# Patient Record
Sex: Male | Born: 1950 | Race: White | Hispanic: No | Smoking: Former smoker
Health system: Southern US, Community
[De-identification: ages and names within clinical notes are randomized; demographics above are authoritative.]

---

## 1997-10-16 ENCOUNTER — Ambulatory Visit (HOSPITAL_BASED_OUTPATIENT_CLINIC_OR_DEPARTMENT_OTHER): Admission: RE | Admit: 1997-10-16 | Discharge: 1997-10-16 | Payer: Self-pay | Admitting: *Deleted

## 1999-03-26 ENCOUNTER — Ambulatory Visit (HOSPITAL_BASED_OUTPATIENT_CLINIC_OR_DEPARTMENT_OTHER): Admission: RE | Admit: 1999-03-26 | Discharge: 1999-03-26 | Payer: Self-pay | Admitting: Orthopedic Surgery

## 2005-07-19 ENCOUNTER — Emergency Department (HOSPITAL_COMMUNITY): Admission: EM | Admit: 2005-07-19 | Discharge: 2005-07-19 | Payer: Self-pay | Admitting: Family Medicine

## 2006-08-08 ENCOUNTER — Emergency Department (HOSPITAL_COMMUNITY): Admission: EM | Admit: 2006-08-08 | Discharge: 2006-08-08 | Payer: Self-pay | Admitting: Emergency Medicine

## 2006-08-10 ENCOUNTER — Ambulatory Visit (HOSPITAL_BASED_OUTPATIENT_CLINIC_OR_DEPARTMENT_OTHER): Admission: RE | Admit: 2006-08-10 | Discharge: 2006-08-10 | Payer: Self-pay | Admitting: Urology

## 2006-08-14 ENCOUNTER — Ambulatory Visit (HOSPITAL_COMMUNITY): Admission: RE | Admit: 2006-08-14 | Discharge: 2006-08-14 | Payer: Self-pay | Admitting: Urology

## 2006-11-03 ENCOUNTER — Ambulatory Visit (HOSPITAL_BASED_OUTPATIENT_CLINIC_OR_DEPARTMENT_OTHER): Admission: RE | Admit: 2006-11-03 | Discharge: 2006-11-03 | Payer: Self-pay | Admitting: Urology

## 2006-12-04 ENCOUNTER — Other Ambulatory Visit: Payer: Self-pay | Admitting: Orthopedic Surgery

## 2006-12-05 ENCOUNTER — Other Ambulatory Visit: Payer: Self-pay | Admitting: Orthopedic Surgery

## 2006-12-05 ENCOUNTER — Inpatient Hospital Stay (HOSPITAL_COMMUNITY): Admission: RE | Admit: 2006-12-05 | Discharge: 2006-12-11 | Payer: Self-pay | Admitting: Orthopedic Surgery

## 2006-12-15 ENCOUNTER — Emergency Department (HOSPITAL_COMMUNITY): Admission: EM | Admit: 2006-12-15 | Discharge: 2006-12-15 | Payer: Self-pay | Admitting: Emergency Medicine

## 2006-12-16 ENCOUNTER — Ambulatory Visit (HOSPITAL_COMMUNITY): Admission: RE | Admit: 2006-12-16 | Discharge: 2006-12-16 | Payer: Self-pay | Admitting: Emergency Medicine

## 2006-12-16 ENCOUNTER — Ambulatory Visit: Payer: Self-pay | Admitting: Vascular Surgery

## 2006-12-16 ENCOUNTER — Encounter (INDEPENDENT_AMBULATORY_CARE_PROVIDER_SITE_OTHER): Payer: Self-pay | Admitting: Emergency Medicine

## 2008-10-15 ENCOUNTER — Emergency Department (HOSPITAL_COMMUNITY): Admission: EM | Admit: 2008-10-15 | Discharge: 2008-10-15 | Payer: Self-pay | Admitting: Family Medicine

## 2008-10-31 ENCOUNTER — Ambulatory Visit (HOSPITAL_BASED_OUTPATIENT_CLINIC_OR_DEPARTMENT_OTHER): Admission: RE | Admit: 2008-10-31 | Discharge: 2008-10-31 | Payer: Self-pay | Admitting: Urology

## 2010-04-18 ENCOUNTER — Inpatient Hospital Stay (HOSPITAL_COMMUNITY): Admission: EM | Admit: 2010-04-18 | Discharge: 2010-04-21 | Payer: Self-pay

## 2010-09-15 LAB — CBC
HCT: 34.6 % — ABNORMAL LOW (ref 39.0–52.0)
HCT: 35.1 % — ABNORMAL LOW (ref 39.0–52.0)
Hemoglobin: 11 g/dL — ABNORMAL LOW (ref 13.0–17.0)
Hemoglobin: 11.5 g/dL — ABNORMAL LOW (ref 13.0–17.0)
Hemoglobin: 12 g/dL — ABNORMAL LOW (ref 13.0–17.0)
Hemoglobin: 13 g/dL (ref 13.0–17.0)
MCH: 29.6 pg (ref 26.0–34.0)
MCHC: 33.2 g/dL (ref 30.0–36.0)
MCHC: 33.5 g/dL (ref 30.0–36.0)
MCV: 88.9 fL (ref 78.0–100.0)
MCV: 90.3 fL (ref 78.0–100.0)
Platelets: 150 10*3/uL (ref 150–400)
RBC: 3.71 MIL/uL — ABNORMAL LOW (ref 4.22–5.81)
RDW: 13.4 % (ref 11.5–15.5)
WBC: 6.1 10*3/uL (ref 4.0–10.5)
WBC: 6.7 10*3/uL (ref 4.0–10.5)
WBC: 8.3 10*3/uL (ref 4.0–10.5)
WBC: 9.4 10*3/uL (ref 4.0–10.5)

## 2010-09-15 LAB — POCT I-STAT, CHEM 8
BUN: 8 mg/dL (ref 6–23)
Creatinine, Ser: 1.3 mg/dL (ref 0.4–1.5)
Potassium: 3.4 meq/L — ABNORMAL LOW (ref 3.5–5.1)
Sodium: 141 meq/L (ref 135–145)

## 2010-09-15 LAB — COMPREHENSIVE METABOLIC PANEL
ALT: 46 U/L (ref 0–53)
AST: 75 U/L — ABNORMAL HIGH (ref 0–37)
CO2: 27 meq/L (ref 19–32)
Calcium: 8.9 mg/dL (ref 8.4–10.5)
GFR calc Af Amer: >60 mL/min (ref >60)
GFR calc non Af Amer: >60 mL/min (ref >60)
Potassium: 3.4 meq/L — ABNORMAL LOW (ref 3.5–5.1)
Sodium: 139 meq/L (ref 135–145)
Total Protein: 6.3 g/dL (ref 6.0–8.3)

## 2010-09-15 LAB — URINALYSIS, ROUTINE W REFLEX MICROSCOPIC
Glucose, UA: NEGATIVE mg/dL
Specific Gravity, Urine: >1.046 — ABNORMAL HIGH (ref 1.005–1.030)

## 2010-09-15 LAB — BASIC METABOLIC PANEL
Calcium: 8.7 mg/dL (ref 8.4–10.5)
GFR calc Af Amer: >60 mL/min (ref >60)
GFR calc non Af Amer: >60 mL/min (ref >60)
Glucose, Bld: 145 mg/dL — ABNORMAL HIGH (ref 70–99)
Potassium: 4.5 mEq/L (ref 3.5–5.1)
Sodium: 139 mEq/L (ref 135–145)

## 2010-09-15 LAB — URINE MICROSCOPIC-ADD ON

## 2010-09-15 LAB — MRSA PCR SCREENING: MRSA by PCR: NEGATIVE

## 2010-10-13 LAB — POCT URINALYSIS DIP (DEVICE)
Bilirubin Urine: NEGATIVE
Glucose, UA: NEGATIVE mg/dL
Specific Gravity, Urine: 1.02 (ref 1.005–1.030)
pH: 7 (ref 5.0–8.0)
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
## 2010-11-16 NOTE — Op Note (Signed)
NAME:  OLLY, SHINER NO.:  [id number]   MEDICAL RECORD NO.:  [id number]          PATIENT TYPE:  AMB   LOCATION:  NESC                         [facility]ACILITY:  [facility]   PHYSICIAN:  Bertram Millard. Dahlstedt, M.D.DATE OF BIRTH:  07-15-1950   DATE OF PROCEDURE:  10/31/2008  DATE OF DISCHARGE:                               OPERATIVE REPORT   PROCEDURE:  Left ureteroscopic stone extraction, retrograde pyelogram.   ANESTHESIA:  General with LMA.   COMPLICATIONS:  None.   BRIEF HISTORY:  A 60 year old male with a fairly longstanding history of  left flank pain.  He has a known small left mid-ureteral stone.  The  patient has been persistently symptomatic, and the stone has not passed.  It was recently recommended that he undergo treatment.  The stone is not  well-seen on KUB for lithotripsy.  He is therefore recommended to  undergo ureteroscopy.  Risks and complications of the procedure have  been discussed with the patient and his wife.  They understand and  desire to proceed.   DESCRIPTION OF PROCEDURE:  The patient was identified in the holding  area, received preoperative IV antibiotics and surgical side marked.  He  was taken to the operating room where general anesthetic was  administered using the LMA.  He was placed in the dorsal lithotomy  position.  Genitalia and perineum were prepped and draped.  A 22-French  panendoscope was passed directly through his urethra, which was normal.  Prostate nonobstructive, bladder without lesions.  Left retrograde was  performed.  This showed a filling defect in the left proximal to mid-  ureter with mild dilatation proximal to this.  The distal ureter was  normal.   At this point, a guidewire was placed.  A 6-French short ureteroscope  was passed under direct vision up to the stone, which was engaged with  the basket and extracted.  No other stones were seen.  The bladder was  drained and the ureteroscope removed.   The patient  tolerated procedure well.  He was awakened and taken to the  PACU in stable condition.  The bladder was drained and he received  Toradol intravenously during procedure.      Bertram Millard. Dahlstedt, M.D.  Electronically Signed     SMD/MEDQ  D:  10/31/2008  T:  10/31/2008  Job:  [id number]

## 2010-11-19 NOTE — Discharge Summary (Signed)
NAME:  Dwayne Barnes, Dwayne Barnes NO.:  [id number]   MEDICAL RECORD NO.:  [id number]          PATIENT TYPE:  INP   LOCATION:  5007                         FACILITY:  [facility]   PHYSICIAN:  Doralee Albino. Carola Frost, M.D. DATE OF BIRTH:  [birth year]   DATE OF ADMISSION:  12/05/2006  DATE OF DISCHARGE:  12/08/2006                               DISCHARGE SUMMARY   DISCHARGE DIAGNOSIS:  Left ulna osteomyelitis, left elbow degloving.   PROCEDURE PERFORMED:  December 05, 2006, I&D of left elbow degloving and  partial excision of proximal ulna, second application of wound vac,  third application of long-arm splint.  On December 08, 2006, STSG of left elbow.   BRIEF SUMMARY/HOSPITAL COURSE:  [name] was admitted on December 05, 2006, for the procedures listed above.  He underwent interval placement  of a wound vac and stayed on empiric broad-spectrum antibiotics.  The  cultures would be negative.  He then underwent split-thickness skin  grafting as his wound appeared to be well-healed at the proximal stent  where the recipient site had been prepared by suturing over the exposed  area.  The patient was approved for a home wound vac, and as such,  having adequate control of pain was able to be discharged on December 08, 2006, for office follow-up.  At that time, his pain was well controlled  on oral narcotics alone.  His wound again had the split-thickness skin  graft with the wound vac over placed over top and the dressing on the  right thigh was simply removed and allowed to air dry.   DISCHARGE INSTRUCTIONS:  [name] is to continue with Vicodin p.r.n.  Continue with the wound vac.  Follow-up in 2 days in the office for  reassessment of the wounds.  He is to contact my office in the interim  with any problems, concerns or questions.      Doralee Albino. Carola Frost, M.D.  Electronically Signed     MHH/MEDQ  D:  01/29/2007  T:  01/30/2007  Job:  [id number]

## 2010-11-19 NOTE — Op Note (Signed)
NAME:  Dwayne Barnes, Dwayne Barnes NO.:  [id number]   MEDICAL RECORD NO.:  [id number]          PATIENT TYPE:  AMB   LOCATION:  NESC                         FACILITY:  [facility]   PHYSICIAN:  Bertram Millard. Dahlstedt, M.D.DATE OF BIRTH:  Jul 29, 1950   DATE OF PROCEDURE:  08/10/2006  DATE OF DISCHARGE:                               OPERATIVE REPORT   PREOPERATIVE DIAGNOSIS:  Large obstructing left mid ureteral stone.   POSTOPERATIVE DIAGNOSIS:  Large obstructing left mid ureteral stone.   SURGICAL PROCEDURES:  System, left retrograde ureteral pyelogram, left  double-J stent placement (6-French x 24 cm contour).   SURGEON:  Dr. Retta Diones   ANESTHESIA:  General with LMA.   COMPLICATIONS:  None.   BRIEF HISTORY:  This 60 year old gentleman presented to my office 3 days  ago with significant pain from a recently diagnosed left mid ureteral  stone approximately 14 mm in size.  He has actually had intermittent  pain for a few months.  He had a fair degree of hydronephrosis on his CT  urogram.   As the stone pain has been there for several months, and the stone is  large, it was recommended that he undergo treatment.  Due to the size of  the stone, it was recommended that he undergo stent placement followed  by lithotripsy.  Risks, complications, and alternatives were also  discussed.  The patient desires to proceed.   DESCRIPTION OF PROCEDURE:  Dwayne Barnes was met in the holding area where  his surgical side was marked and antibiotics were administered  intravenously.  He was taken to the operating room where general  anesthetic was administered.  He was placed in the dorsal lithotomy  position.  Genitalia and perineum were prepped and draped.  A 22-French  panendoscope was passed under direct vision through his urethra.  There  was mild narrowing of the bulbous urethra.  Prostate was nonobstructive.  The bladder was entered and inspected circumferentially.  No tumors, no  trabeculations, no foreign bodies.  Ureteral orifices normal in  configuration and location.   The left ureteral orifice was cannulated with a 6-French open-end  catheter.  Retrograde pyelogram was then performed.  The distal ureter  was normal.  At the area of a calcification around the L4 transverse  process on the left, there was a filling defect consistent with the  stone.  No contrast was able to get by this stone - it was significantly  obstructive.   At this point, a guidewire was advanced through the open-end catheter  and was quite difficult to navigate by the stone.  However, this was  done.  A good curl of the wire was seen in the renal pelvic area.  I  then passed a 24 cm x 6-French contour stent (string removed) up over  the guidewire.  Once adequate positioning was seen  fluoroscopically and cystoscopically, the guidewire was removed.  Good  proximal and distal curls were seen on the stent.  The bladder was  drained and the procedure terminated.   The patient tolerated the procedure well.  He was awakened  and taken to  PACU in stable condition.      Bertram Millard. Dahlstedt, M.D.  Electronically Signed     SMD/MEDQ  D:  08/10/2006  T:  08/10/2006  Job:  [id number]

## 2010-11-19 NOTE — Op Note (Signed)
NAME:  Dwayne Barnes, Dwayne Barnes NO.:  [id number]   MEDICAL RECORD NO.:  [id number]          PATIENT TYPE:  AMB   LOCATION:  NESC                         [facility]ACILITY:  [facility]   PHYSICIAN:  Bertram Millard. Dahlstedt, M.D.DATE OF BIRTH:  1951/02/09   DATE OF PROCEDURE:  11/03/2006  DATE OF DISCHARGE:                               OPERATIVE REPORT   PREOPERATIVE DIAGNOSIS:  Left ureteral stone.   POSTOPERATIVE DIAGNOSIS:  Left ureteral stone.   PROCEDURE:  1. Cystoscopy.  2. Left retrograde pyelography with interpretation.  3. Left ureteroscopic stone manipulation with laser lithotripsy.  4. Left ureteral stent placement.   SURGEON:  Bertram Millard. Retta Diones, M.D.   RESIDENT:  Terie Purser, M.D.   ANESTHESIA:  General.   SPECIMENS:  Stone fragments.   ESTIMATED BLOOD LOSS:  Minimal.   COMPLICATIONS:  None.   DRAINS:  6-French 24-cm double-J ureteral stent with tether.   DISPOSITION:  Stable to postanesthesia care unit.   INDICATIONS:  Mr. Altizer is a 60 year old gentleman who has a history  of a left-sided ureteral stone.  This is approximately 7 x 4 mm.  He has  undergone shockwave lithotripsy but has had unsuccessful passage.  He is  brought to the operating room today for ureteroscopic stone  manipulation.  The benefits and risks of the procedure were explained,  and consent was obtained.   DESCRIPTION OF PROCEDURE:  The patient was brought to the operating  room.  He was properly identified.  A timeout was performed to confirm  correct patient, procedure, and side.  He was administered general  anesthesia, given preoperative antibiotic, and placed in the  dorsolithotomy position and carefully prepped and draped in the usual  sterile manner.   Cystoscopy was then performed using 12-degree lens, 20-French sheath.  This anterior and posterior urethra were normal.  The bladder revealed  no evidence of stone or other mucosal abnormalities.  Both ureteral  orifices were  in normal anatomic position.   We then placed a 6-French end-hole catheter in the left ureteral  orifice.  Left retrograde pyelogram was performed.  This revealed  evidence of the stone at approximately L4.  There were no other filling  defects noted.   We then placed a working wire through the catheter to confirm position  in the kidney.  The cystoscope was removed, and the semi-rigid  ureteroscope was introduced into the left ureter up to the level of the  stone.  There was some edema surrounding the stone just distal.  We  elected to fragment the stone using the holmium laser.  The stone was  fragmented into several small fragments.  The nitinol basket was used to  remove several of these fragments.  The remaining fragments were small  and would likely pass on their own.  We therefore removed the  ureteroscope.  There were no other mucosal abnormalities or other stones  noted in the ureter.  It should also be noted that the scope was  advanced up to the level just distal to the UPJ.  Findings in this  portion of the ureter  were normal.  We then reintroduced the cystoscope.  A 6-French 24-cm double-J ureteral stent with tether was placed under  cystoscopic and fluoroscopic guidance.  Proper position was confirmed  with fluoroscopy.  The bladder was then drained.  The scope was removed,  and the procedure was terminated.   The patient was awakened from anesthesia and transported to the recovery  room in stable condition.  Please note that Dr. Retta Diones was present  and participated in all aspects of this procedure.     ______________________________  Terie Purser, MD      Bertram Millard. Dahlstedt, M.D.  Electronically Signed    JH/MEDQ  D:  11/03/2006  T:  11/03/2006  Job:  [id number]

## 2011-04-21 LAB — BASIC METABOLIC PANEL
BUN: 7
CO2: 30
Calcium: 9.8
Creatinine, Ser: 0.94
Glucose, Bld: 103 — ABNORMAL HIGH

## 2011-04-21 LAB — I-STAT 8, (EC8 V) (CONVERTED LAB)
Acid-Base Excess: 6 — ABNORMAL HIGH
Chloride: 102
HCT: 38 — ABNORMAL LOW
Operator id: 282201
Potassium: 4.4
pH, Ven: 7.348 — ABNORMAL HIGH

## 2011-04-21 LAB — DIFFERENTIAL
Basophils Absolute: 0
Eosinophils Absolute: 0.2
Eosinophils Relative: 3
Lymphocytes Relative: 30
Monocytes Absolute: 0.6

## 2011-04-21 LAB — POCT I-STAT CREATININE: Creatinine, Ser: 1.1

## 2011-04-21 LAB — CBC
HCT: 34.9 — ABNORMAL LOW
HCT: 35.3 — ABNORMAL LOW
Hemoglobin: 11.7 — ABNORMAL LOW
Hemoglobin: 11.8 — ABNORMAL LOW
MCHC: 33.6
MCV: 86.1
MCV: 87.2
RBC: 4 — ABNORMAL LOW
RDW: 13.3
RDW: 13.7

## 2011-04-21 LAB — APTT: aPTT: 31

## 2014-12-20 ENCOUNTER — Emergency Department (HOSPITAL_COMMUNITY)
Admission: EM | Admit: 2014-12-20 | Discharge: 2014-12-20 | Disposition: A | Discharge status: Home / Self Care | Payer: Medicare Other | Attending: Emergency Medicine | Admitting: Emergency Medicine

## 2014-12-20 ENCOUNTER — Encounter (HOSPITAL_COMMUNITY): Payer: Self-pay | Admitting: Nurse Practitioner

## 2014-12-20 DIAGNOSIS — M25561 Pain in right knee: Secondary | ICD-10-CM | POA: Diagnosis not present

## 2014-12-20 DIAGNOSIS — Z79899 Other long term (current) drug therapy: Secondary | ICD-10-CM | POA: Diagnosis not present

## 2014-12-20 DIAGNOSIS — L03115 Cellulitis of right lower limb: Secondary | ICD-10-CM | POA: Insufficient documentation

## 2014-12-20 DIAGNOSIS — Z23 Encounter for immunization: Secondary | ICD-10-CM | POA: Diagnosis not present

## 2014-12-20 DIAGNOSIS — M25569 Pain in unspecified knee: Secondary | ICD-10-CM

## 2014-12-20 DIAGNOSIS — Z87891 Personal history of nicotine dependence: Secondary | ICD-10-CM | POA: Diagnosis not present

## 2014-12-20 DIAGNOSIS — L988 Other specified disorders of the skin and subcutaneous tissue: Secondary | ICD-10-CM | POA: Diagnosis present

## 2014-12-20 MED ORDER — CEPHALEXIN 500 MG PO CAPS: 500.0000 mg | ORAL_CAPSULE | Freq: Four times a day (QID) | ORAL | Status: DC

## 2014-12-20 MED ORDER — SULFAMETHOXAZOLE-TRIMETHOPRIM 800-160 MG PO TABS: 1.0000 | ORAL_TABLET | Freq: Two times a day (BID) | ORAL | Status: AC

## 2014-12-20 MED ORDER — CLINDAMYCIN HCL 300 MG PO CAPS: 300.0000 mg | ORAL_CAPSULE | Freq: Three times a day (TID) | ORAL | Status: DC

## 2014-12-20 MED ORDER — CLINDAMYCIN HCL 300 MG PO CAPS
300.0000 mg | ORAL_CAPSULE | Freq: Once | ORAL | Status: AC
  Administered 2014-12-20: 300 mg via ORAL
  Filled 2014-12-20: qty 1

## 2014-12-20 MED ORDER — TETANUS-DIPHTH-ACELL PERTUSSIS 5-2.5-18.5 LF-MCG/0.5 IM SUSP
0.5000 mL | Freq: Once | INTRAMUSCULAR | Status: AC
  Administered 2014-12-20: 0.5 mL via INTRAMUSCULAR
  Filled 2014-12-20: qty 0.5

## 2014-12-20 NOTE — ED Notes (Signed)
This Rn provided the pt $9 for the pt to get his abx Rx, pt verbalizes understanding of follow up care & medication regimen

## 2014-12-20 NOTE — Discharge Instructions (Signed)
Take bactrim, keflex for a week.   Continue to apply neosporin.   Do NOT try to stick anything into the wound.  Follow up with your doctor.   Return to ER if you have fever, worse redness and pain, knee swelling, unable to walk.

## 2014-12-20 NOTE — ED Provider Notes (Addendum)
CSN: [id number]     Arrival date & time 12/20/14  1602 History   First MD Initiated Contact with Patient 12/20/14 1701     Chief Complaint  Patient presents with  . Skin Problem     (Consider location/radiation/quality/duration/timing/severity/associated sxs/prior Treatment) The history is provided by the patient.  MERION PETTUS is a 64 y.o. male here presenting with possible skin infection. He states that he was uncovering the tarp off of his lawnmower about 3 days ago. States that something may have bitten during that time and he noticed a blister. He tried to pop it with a needle 2 days ago and now has been more red and painful. Been trying to put Neosporin on it with no relief. Denies any fevers or chills or knee swelling. Unknown tetanus.    History reviewed. No pertinent past medical history. History reviewed. No pertinent past surgical history. History reviewed. No pertinent family history. History  Substance Use Topics  . Smoking status: Former Games developer  . Smokeless tobacco: Not on file  . Alcohol Use: No    Review of Systems  Skin: Positive for wound.  All other systems reviewed and are negative.     Allergies  Review of patient's allergies indicates no known allergies.  Home Medications   Prior to Admission medications   Medication Sig Start Date End Date Taking? Authorizing Provider  CREATINE PO Take 1 tablet by mouth 3 (three) times daily.   Yes Historical Provider, MD  guaiFENesin (MUCINEX) 600 MG 12 hr tablet Take 600 mg by mouth 2 (two) times daily as needed for cough or to loosen phlegm.   Yes Historical Provider, MD  hydrocortisone cream 1 % Apply 1 application topically as needed (for bite).    Yes Historical Provider, MD  ibuprofen (ADVIL,MOTRIN) 200 MG tablet Take 400 mg by mouth 2 (two) times daily as needed for moderate pain.   Yes Historical Provider, MD  Multiple Vitamins-Minerals (ALIVE MENS ENERGY) TABS Take 1 tablet by mouth daily.   Yes Historical  Provider, MD  Neomycin-Bacitracin-Polymyxin (NEOSPORIN ORIGINAL) [phone] OINT Apply 1 application topically as needed (for bite).   Yes Historical Provider, MD  Omega-3 Fatty Acids (OMEGA 3 PO) Take 1 capsule by mouth daily.   Yes Historical Provider, MD  OVER THE COUNTER MEDICATION Apply 1 application topically as needed (for bite).   Yes Historical Provider, MD  OVER THE COUNTER MEDICATION Take 1 tablet by mouth daily. "ANDROZONE" TESTOSTERONE SUPPLEMENT   Yes Historical Provider, MD   BP 118/75 mmHg  Pulse 71  Temp(Src) 97.5 F (36.4 C) (Oral)  Resp 18  Ht 6' (1.829 m)  Wt 145 lb 7 oz (65.97 kg)  BMI 19.72 kg/m2  SpO2 97% Physical Exam  Constitutional: He is oriented to person, place, and time. He appears well-developed.  HENT:  Head: Normocephalic.  Eyes: Pupils are equal, round, and reactive to light.  Neck: Normal range of motion.  Cardiovascular: Normal rate.   Pulmonary/Chest: Effort normal.  Abdominal: Soft.  Musculoskeletal: Normal range of motion.  Nl ROM R knee. No knee effusion or tenderness   Neurological: He is alert and oriented to person, place, and time.  Skin:  On the medial side of R knee, there is superficial cellulitis with puncture wound draining out clear fluid.   Psychiatric: He has a normal mood and affect. His behavior is normal. Judgment and thought content normal.  Nursing note and vitals reviewed.   ED Course  Procedures (including critical care time)  EMERGENCY DEPARTMENT US SOFT TISSUE INTERPRETATION "Study: Limited Ultrasound of the noted body part in comments below"  INDICATIONS: Soft tissue infection Multiple views of the body part are obtained with a multi-frequency linear probe  PERFORMED BY:  Myself  IMAGES ARCHIVED?: Yes  SIDE:Right   BODY PART:Lower extremity  FINDINGS: No abcess noted and Cellulitis present  LIMITATIONS:  Emergent Procedure  INTERPRETATION:  Cellulitis present  COMMENT:  + cellulitis, ? Small early  abscess but is draining already     Labs Review Labs Reviewed - No data to display  Imaging Review No results found.   EKG Interpretation None      MDM   Final diagnoses:  Knee pain   ROWIN BAYRON is a 64 y.o. male here with cellulitis. Nl ROM R knee. No signs of septic joint. US showed no large effusion. Confirmed cellulitis, ? Small abscess but its draining already. Will not need I and D right now. Doesn't appear septic and vitals stable. Will dc home with clinda. Tdap updated.   6:51 PM Unable to afford clinda. Will give keflex, bactrim instead.    Richardean Canal, MD 12/20/14 1719  Richardean Canal, MD 12/20/14 [phone]

## 2014-12-20 NOTE — ED Notes (Signed)
Pt noticed a red, painful sore to R inner knee 2 days ago after working outside. He tried to put a hot needle in it to drain it an dnow has increased pani, redness and swelling.

## 2014-12-20 NOTE — ED Notes (Signed)
Dwayne Lay, MD informed of pt not being able to afford his Rx, will update pt on plan of care

## 2014-12-20 NOTE — ED Notes (Signed)
Attempting to d/c pt, pt states, "I cannot get my prescription until the second week in July. I am on disability." social work contacted

## 2016-11-22 DIAGNOSIS — L237 Allergic contact dermatitis due to plants, except food: Secondary | ICD-10-CM | POA: Diagnosis not present

## 2017-02-18 DIAGNOSIS — L237 Allergic contact dermatitis due to plants, except food: Secondary | ICD-10-CM | POA: Diagnosis not present

## 2017-04-21 DIAGNOSIS — H25011 Cortical age-related cataract, right eye: Secondary | ICD-10-CM | POA: Diagnosis not present

## 2017-04-21 DIAGNOSIS — H524 Presbyopia: Secondary | ICD-10-CM | POA: Diagnosis not present

## 2017-04-21 DIAGNOSIS — H2513 Age-related nuclear cataract, bilateral: Secondary | ICD-10-CM | POA: Diagnosis not present

## 2017-05-16 DIAGNOSIS — H25011 Cortical age-related cataract, right eye: Secondary | ICD-10-CM | POA: Diagnosis not present

## 2017-05-16 DIAGNOSIS — H25811 Combined forms of age-related cataract, right eye: Secondary | ICD-10-CM | POA: Diagnosis not present

## 2017-05-16 DIAGNOSIS — H2511 Age-related nuclear cataract, right eye: Secondary | ICD-10-CM | POA: Diagnosis not present

## 2018-06-18 DIAGNOSIS — Z961 Presence of intraocular lens: Secondary | ICD-10-CM | POA: Diagnosis not present

## 2018-06-18 DIAGNOSIS — H2511 Age-related nuclear cataract, right eye: Secondary | ICD-10-CM | POA: Diagnosis not present

## 2019-10-22 DIAGNOSIS — Z125 Encounter for screening for malignant neoplasm of prostate: Secondary | ICD-10-CM | POA: Diagnosis not present

## 2019-10-22 DIAGNOSIS — Z13220 Encounter for screening for lipoid disorders: Secondary | ICD-10-CM | POA: Diagnosis not present

## 2019-10-22 DIAGNOSIS — Z0000 Encounter for general adult medical examination without abnormal findings: Secondary | ICD-10-CM | POA: Diagnosis not present

## 2020-05-04 DIAGNOSIS — I2699 Other pulmonary embolism without acute cor pulmonale: Secondary | ICD-10-CM

## 2020-05-04 HISTORY — DX: Other pulmonary embolism without acute cor pulmonale: I26.99

## 2020-05-20 ENCOUNTER — Other Ambulatory Visit: Payer: Self-pay

## 2020-05-20 ENCOUNTER — Inpatient Hospital Stay (HOSPITAL_COMMUNITY)
Admission: EM | Admit: 2020-05-20 | Discharge: 2020-06-25 | DRG: 208 | Disposition: A | Discharge status: Skilled Nursing Facility | Payer: Medicare HMO | Attending: Internal Medicine | Admitting: Internal Medicine

## 2020-05-20 ENCOUNTER — Encounter (HOSPITAL_COMMUNITY): Payer: Self-pay | Admitting: *Deleted

## 2020-05-20 DIAGNOSIS — Z8674 Personal history of sudden cardiac arrest: Secondary | ICD-10-CM | POA: Diagnosis not present

## 2020-05-20 DIAGNOSIS — I480 Paroxysmal atrial fibrillation: Secondary | ICD-10-CM | POA: Diagnosis present

## 2020-05-20 DIAGNOSIS — B159 Hepatitis A without hepatic coma: Secondary | ICD-10-CM | POA: Diagnosis not present

## 2020-05-20 DIAGNOSIS — I82512 Chronic embolism and thrombosis of left femoral vein: Secondary | ICD-10-CM | POA: Diagnosis not present

## 2020-05-20 DIAGNOSIS — J9811 Atelectasis: Secondary | ICD-10-CM | POA: Diagnosis not present

## 2020-05-20 DIAGNOSIS — I82412 Acute embolism and thrombosis of left femoral vein: Secondary | ICD-10-CM | POA: Diagnosis present

## 2020-05-20 DIAGNOSIS — I468 Cardiac arrest due to other underlying condition: Secondary | ICD-10-CM | POA: Diagnosis not present

## 2020-05-20 DIAGNOSIS — N179 Acute kidney failure, unspecified: Secondary | ICD-10-CM | POA: Diagnosis not present

## 2020-05-20 DIAGNOSIS — J9690 Respiratory failure, unspecified, unspecified whether with hypoxia or hypercapnia: Secondary | ICD-10-CM | POA: Diagnosis not present

## 2020-05-20 DIAGNOSIS — A0472 Enterocolitis due to Clostridium difficile, not specified as recurrent: Secondary | ICD-10-CM | POA: Diagnosis present

## 2020-05-20 DIAGNOSIS — K529 Noninfective gastroenteritis and colitis, unspecified: Secondary | ICD-10-CM | POA: Diagnosis not present

## 2020-05-20 DIAGNOSIS — I82402 Acute embolism and thrombosis of unspecified deep veins of left lower extremity: Secondary | ICD-10-CM

## 2020-05-20 DIAGNOSIS — M6281 Muscle weakness (generalized): Secondary | ICD-10-CM | POA: Diagnosis not present

## 2020-05-20 DIAGNOSIS — Z452 Encounter for adjustment and management of vascular access device: Secondary | ICD-10-CM

## 2020-05-20 DIAGNOSIS — G9341 Metabolic encephalopathy: Secondary | ICD-10-CM | POA: Diagnosis not present

## 2020-05-20 DIAGNOSIS — E871 Hypo-osmolality and hyponatremia: Secondary | ICD-10-CM | POA: Diagnosis not present

## 2020-05-20 DIAGNOSIS — K921 Melena: Secondary | ICD-10-CM | POA: Diagnosis not present

## 2020-05-20 DIAGNOSIS — E43 Unspecified severe protein-calorie malnutrition: Secondary | ICD-10-CM | POA: Diagnosis not present

## 2020-05-20 DIAGNOSIS — I50810 Right heart failure, unspecified: Secondary | ICD-10-CM | POA: Diagnosis present

## 2020-05-20 DIAGNOSIS — R5381 Other malaise: Secondary | ICD-10-CM | POA: Diagnosis not present

## 2020-05-20 DIAGNOSIS — N390 Urinary tract infection, site not specified: Secondary | ICD-10-CM

## 2020-05-20 DIAGNOSIS — E872 Acidosis: Secondary | ICD-10-CM | POA: Diagnosis not present

## 2020-05-20 DIAGNOSIS — I2699 Other pulmonary embolism without acute cor pulmonale: Secondary | ICD-10-CM | POA: Diagnosis present

## 2020-05-20 DIAGNOSIS — K648 Other hemorrhoids: Secondary | ICD-10-CM | POA: Diagnosis present

## 2020-05-20 DIAGNOSIS — I824Y2 Acute embolism and thrombosis of unspecified deep veins of left proximal lower extremity: Secondary | ICD-10-CM

## 2020-05-20 DIAGNOSIS — D72829 Elevated white blood cell count, unspecified: Secondary | ICD-10-CM | POA: Diagnosis not present

## 2020-05-20 DIAGNOSIS — H547 Unspecified visual loss: Secondary | ICD-10-CM | POA: Diagnosis present

## 2020-05-20 DIAGNOSIS — J1282 Pneumonia due to coronavirus disease 2019: Secondary | ICD-10-CM | POA: Diagnosis present

## 2020-05-20 DIAGNOSIS — N200 Calculus of kidney: Secondary | ICD-10-CM | POA: Diagnosis present

## 2020-05-20 DIAGNOSIS — R279 Unspecified lack of coordination: Secondary | ICD-10-CM | POA: Diagnosis not present

## 2020-05-20 DIAGNOSIS — R0602 Shortness of breath: Secondary | ICD-10-CM | POA: Diagnosis not present

## 2020-05-20 DIAGNOSIS — J9601 Acute respiratory failure with hypoxia: Secondary | ICD-10-CM

## 2020-05-20 DIAGNOSIS — J984 Other disorders of lung: Secondary | ICD-10-CM | POA: Diagnosis not present

## 2020-05-20 DIAGNOSIS — Z01818 Encounter for other preprocedural examination: Secondary | ICD-10-CM

## 2020-05-20 DIAGNOSIS — I2609 Other pulmonary embolism with acute cor pulmonale: Secondary | ICD-10-CM | POA: Diagnosis not present

## 2020-05-20 DIAGNOSIS — U071 COVID-19: Principal | ICD-10-CM

## 2020-05-20 DIAGNOSIS — R531 Weakness: Secondary | ICD-10-CM

## 2020-05-20 DIAGNOSIS — J811 Chronic pulmonary edema: Secondary | ICD-10-CM | POA: Diagnosis not present

## 2020-05-20 DIAGNOSIS — T380X5A Adverse effect of glucocorticoids and synthetic analogues, initial encounter: Secondary | ICD-10-CM | POA: Diagnosis not present

## 2020-05-20 DIAGNOSIS — Z87891 Personal history of nicotine dependence: Secondary | ICD-10-CM

## 2020-05-20 DIAGNOSIS — R4182 Altered mental status, unspecified: Secondary | ICD-10-CM | POA: Diagnosis not present

## 2020-05-20 DIAGNOSIS — N3289 Other specified disorders of bladder: Secondary | ICD-10-CM | POA: Diagnosis not present

## 2020-05-20 DIAGNOSIS — Z7901 Long term (current) use of anticoagulants: Secondary | ICD-10-CM | POA: Diagnosis not present

## 2020-05-20 DIAGNOSIS — E46 Unspecified protein-calorie malnutrition: Secondary | ICD-10-CM | POA: Diagnosis not present

## 2020-05-20 DIAGNOSIS — R1310 Dysphagia, unspecified: Secondary | ICD-10-CM | POA: Diagnosis not present

## 2020-05-20 DIAGNOSIS — D75838 Other thrombocytosis: Secondary | ICD-10-CM | POA: Diagnosis not present

## 2020-05-20 DIAGNOSIS — Z4659 Encounter for fitting and adjustment of other gastrointestinal appliance and device: Secondary | ICD-10-CM

## 2020-05-20 DIAGNOSIS — J90 Pleural effusion, not elsewhere classified: Secondary | ICD-10-CM | POA: Diagnosis not present

## 2020-05-20 DIAGNOSIS — R570 Cardiogenic shock: Secondary | ICD-10-CM | POA: Diagnosis not present

## 2020-05-20 DIAGNOSIS — Z681 Body mass index (BMI) 19 or less, adult: Secondary | ICD-10-CM | POA: Diagnosis not present

## 2020-05-20 DIAGNOSIS — R1312 Dysphagia, oropharyngeal phase: Secondary | ICD-10-CM | POA: Diagnosis not present

## 2020-05-20 DIAGNOSIS — R262 Difficulty in walking, not elsewhere classified: Secondary | ICD-10-CM | POA: Diagnosis not present

## 2020-05-20 DIAGNOSIS — I2602 Saddle embolus of pulmonary artery with acute cor pulmonale: Secondary | ICD-10-CM | POA: Diagnosis not present

## 2020-05-20 DIAGNOSIS — H9190 Unspecified hearing loss, unspecified ear: Secondary | ICD-10-CM | POA: Diagnosis present

## 2020-05-20 DIAGNOSIS — J189 Pneumonia, unspecified organism: Secondary | ICD-10-CM | POA: Diagnosis not present

## 2020-05-20 DIAGNOSIS — R739 Hyperglycemia, unspecified: Secondary | ICD-10-CM | POA: Diagnosis not present

## 2020-05-20 DIAGNOSIS — D6489 Other specified anemias: Secondary | ICD-10-CM | POA: Diagnosis present

## 2020-05-20 DIAGNOSIS — M47816 Spondylosis without myelopathy or radiculopathy, lumbar region: Secondary | ICD-10-CM | POA: Diagnosis not present

## 2020-05-20 DIAGNOSIS — E876 Hypokalemia: Secondary | ICD-10-CM | POA: Diagnosis not present

## 2020-05-20 DIAGNOSIS — I10 Essential (primary) hypertension: Secondary | ICD-10-CM | POA: Diagnosis present

## 2020-05-20 DIAGNOSIS — R059 Cough, unspecified: Secondary | ICD-10-CM | POA: Diagnosis not present

## 2020-05-20 DIAGNOSIS — I2601 Septic pulmonary embolism with acute cor pulmonale: Secondary | ICD-10-CM | POA: Diagnosis not present

## 2020-05-20 DIAGNOSIS — Z4682 Encounter for fitting and adjustment of non-vascular catheter: Secondary | ICD-10-CM | POA: Diagnosis not present

## 2020-05-20 DIAGNOSIS — R636 Underweight: Secondary | ICD-10-CM | POA: Diagnosis present

## 2020-05-20 DIAGNOSIS — D649 Anemia, unspecified: Secondary | ICD-10-CM | POA: Diagnosis not present

## 2020-05-20 LAB — URINALYSIS, ROUTINE W REFLEX MICROSCOPIC
Bilirubin Urine: NEGATIVE
Glucose, UA: NEGATIVE mg/dL
Ketones, ur: 20 mg/dL — AB
Nitrite: NEGATIVE
Protein, ur: 100 mg/dL — AB
Specific Gravity, Urine: 1.024 (ref 1.005–1.030)
WBC, UA: >50 WBC/hpf — ABNORMAL HIGH (ref 0–5)
pH: 6 (ref 5.0–8.0)

## 2020-05-20 LAB — COMPREHENSIVE METABOLIC PANEL
ALT: 18 U/L (ref 0–44)
AST: 21 U/L (ref 15–41)
Albumin: 3.1 g/dL — ABNORMAL LOW (ref 3.5–5.0)
Alkaline Phosphatase: 64 U/L (ref 38–126)
Anion gap: 12 (ref 5–15)
BUN: 30 mg/dL — ABNORMAL HIGH (ref 8–23)
CO2: 22 mmol/L (ref 22–32)
Calcium: 9.2 mg/dL (ref 8.9–10.3)
Chloride: 105 mmol/L (ref 98–111)
Creatinine, Ser: 1.06 mg/dL (ref 0.61–1.24)
GFR, Estimated: >60 mL/min (ref >60)
Glucose, Bld: 98 mg/dL (ref 70–99)
Potassium: 3.6 mmol/L (ref 3.5–5.1)
Sodium: 139 mmol/L (ref 135–145)
Total Bilirubin: 1.9 mg/dL — ABNORMAL HIGH (ref 0.3–1.2)
Total Protein: 7.3 g/dL (ref 6.5–8.1)

## 2020-05-20 LAB — CBC
HCT: 45.5 % (ref 39.0–52.0)
Hemoglobin: 14.3 g/dL (ref 13.0–17.0)
MCH: 28.7 pg (ref 26.0–34.0)
MCHC: 31.4 g/dL (ref 30.0–36.0)
MCV: 91.4 fL (ref 80.0–100.0)
Platelets: 496 10*3/uL — ABNORMAL HIGH (ref 150–400)
RBC: 4.98 MIL/uL (ref 4.22–5.81)
RDW: 13.2 % (ref 11.5–15.5)
WBC: 10.9 10*3/uL — ABNORMAL HIGH (ref 4.0–10.5)
nRBC: 0 % (ref 0.0–0.2)

## 2020-05-20 LAB — LIPASE, BLOOD: Lipase: 31 U/L (ref 11–51)

## 2020-05-20 MED ORDER — SODIUM CHLORIDE 0.9 % IV BOLUS
1000.0000 mL | Freq: Once | INTRAVENOUS | Status: AC
  Administered 2020-05-21: 1000 mL via INTRAVENOUS

## 2020-05-20 MED ORDER — SODIUM CHLORIDE 0.9 % IV SOLN
2.0000 g | Freq: Once | INTRAVENOUS | Status: AC
Start: 2020-05-20 — End: 2020-05-21
  Administered 2020-05-21: 2 g via INTRAVENOUS
  Filled 2020-05-20: qty 20

## 2020-05-20 NOTE — ED Triage Notes (Addendum)
Pt stating lower back pain, has not eaten in 5 days, has only drank water. Says he has been vomiting. Pt is very hard of hearing, has bilateral hearing aides

## 2020-05-20 NOTE — ED Triage Notes (Signed)
Pt arrives from home, via [facility], Pt has c/o Lower back pain over the spine area. Seemed to not be tender on palpation, does grimace in pain to movement, en rroute,  90, nsr , cbg 138, 120/80's. Has had weakness, nausea, vomiting for a week, symptoms are subsided today. IV established in the right AC.

## 2020-05-21 ENCOUNTER — Emergency Department (HOSPITAL_COMMUNITY): Payer: Medicare HMO

## 2020-05-21 ENCOUNTER — Inpatient Hospital Stay (HOSPITAL_COMMUNITY): Payer: Medicare HMO

## 2020-05-21 ENCOUNTER — Encounter (HOSPITAL_COMMUNITY): Payer: Self-pay | Admitting: Internal Medicine

## 2020-05-21 DIAGNOSIS — I468 Cardiac arrest due to other underlying condition: Secondary | ICD-10-CM | POA: Diagnosis not present

## 2020-05-21 DIAGNOSIS — E43 Unspecified severe protein-calorie malnutrition: Secondary | ICD-10-CM | POA: Diagnosis not present

## 2020-05-21 DIAGNOSIS — H9190 Unspecified hearing loss, unspecified ear: Secondary | ICD-10-CM | POA: Diagnosis present

## 2020-05-21 DIAGNOSIS — R0602 Shortness of breath: Secondary | ICD-10-CM | POA: Diagnosis not present

## 2020-05-21 DIAGNOSIS — N3289 Other specified disorders of bladder: Secondary | ICD-10-CM | POA: Diagnosis not present

## 2020-05-21 DIAGNOSIS — N200 Calculus of kidney: Secondary | ICD-10-CM | POA: Diagnosis not present

## 2020-05-21 DIAGNOSIS — E46 Unspecified protein-calorie malnutrition: Secondary | ICD-10-CM | POA: Diagnosis not present

## 2020-05-21 DIAGNOSIS — J9601 Acute respiratory failure with hypoxia: Secondary | ICD-10-CM | POA: Diagnosis not present

## 2020-05-21 DIAGNOSIS — D649 Anemia, unspecified: Secondary | ICD-10-CM | POA: Diagnosis not present

## 2020-05-21 DIAGNOSIS — J1282 Pneumonia due to coronavirus disease 2019: Secondary | ICD-10-CM | POA: Diagnosis present

## 2020-05-21 DIAGNOSIS — N179 Acute kidney failure, unspecified: Secondary | ICD-10-CM | POA: Diagnosis not present

## 2020-05-21 DIAGNOSIS — D72829 Elevated white blood cell count, unspecified: Secondary | ICD-10-CM | POA: Diagnosis not present

## 2020-05-21 DIAGNOSIS — I10 Essential (primary) hypertension: Secondary | ICD-10-CM | POA: Diagnosis present

## 2020-05-21 DIAGNOSIS — N390 Urinary tract infection, site not specified: Secondary | ICD-10-CM | POA: Diagnosis not present

## 2020-05-21 DIAGNOSIS — H547 Unspecified visual loss: Secondary | ICD-10-CM | POA: Diagnosis present

## 2020-05-21 DIAGNOSIS — J189 Pneumonia, unspecified organism: Secondary | ICD-10-CM | POA: Diagnosis not present

## 2020-05-21 DIAGNOSIS — R059 Cough, unspecified: Secondary | ICD-10-CM | POA: Diagnosis not present

## 2020-05-21 DIAGNOSIS — K529 Noninfective gastroenteritis and colitis, unspecified: Secondary | ICD-10-CM | POA: Diagnosis not present

## 2020-05-21 DIAGNOSIS — I2602 Saddle embolus of pulmonary artery with acute cor pulmonale: Secondary | ICD-10-CM | POA: Diagnosis not present

## 2020-05-21 DIAGNOSIS — I2601 Septic pulmonary embolism with acute cor pulmonale: Secondary | ICD-10-CM | POA: Diagnosis not present

## 2020-05-21 DIAGNOSIS — E872 Acidosis: Secondary | ICD-10-CM | POA: Diagnosis not present

## 2020-05-21 DIAGNOSIS — K921 Melena: Secondary | ICD-10-CM | POA: Diagnosis not present

## 2020-05-21 DIAGNOSIS — J9690 Respiratory failure, unspecified, unspecified whether with hypoxia or hypercapnia: Secondary | ICD-10-CM | POA: Diagnosis not present

## 2020-05-21 DIAGNOSIS — R570 Cardiogenic shock: Secondary | ICD-10-CM | POA: Diagnosis not present

## 2020-05-21 DIAGNOSIS — I2699 Other pulmonary embolism without acute cor pulmonale: Secondary | ICD-10-CM | POA: Diagnosis present

## 2020-05-21 DIAGNOSIS — J811 Chronic pulmonary edema: Secondary | ICD-10-CM | POA: Diagnosis not present

## 2020-05-21 DIAGNOSIS — R262 Difficulty in walking, not elsewhere classified: Secondary | ICD-10-CM | POA: Diagnosis not present

## 2020-05-21 DIAGNOSIS — U071 COVID-19: Secondary | ICD-10-CM

## 2020-05-21 DIAGNOSIS — I82512 Chronic embolism and thrombosis of left femoral vein: Secondary | ICD-10-CM | POA: Diagnosis not present

## 2020-05-21 DIAGNOSIS — E871 Hypo-osmolality and hyponatremia: Secondary | ICD-10-CM | POA: Diagnosis not present

## 2020-05-21 DIAGNOSIS — D6489 Other specified anemias: Secondary | ICD-10-CM | POA: Diagnosis present

## 2020-05-21 DIAGNOSIS — J984 Other disorders of lung: Secondary | ICD-10-CM | POA: Diagnosis not present

## 2020-05-21 DIAGNOSIS — B159 Hepatitis A without hepatic coma: Secondary | ICD-10-CM | POA: Diagnosis not present

## 2020-05-21 DIAGNOSIS — I824Y2 Acute embolism and thrombosis of unspecified deep veins of left proximal lower extremity: Secondary | ICD-10-CM | POA: Diagnosis not present

## 2020-05-21 DIAGNOSIS — Z452 Encounter for adjustment and management of vascular access device: Secondary | ICD-10-CM | POA: Diagnosis not present

## 2020-05-21 DIAGNOSIS — R531 Weakness: Secondary | ICD-10-CM | POA: Diagnosis not present

## 2020-05-21 DIAGNOSIS — Z8674 Personal history of sudden cardiac arrest: Secondary | ICD-10-CM | POA: Diagnosis not present

## 2020-05-21 DIAGNOSIS — G9341 Metabolic encephalopathy: Secondary | ICD-10-CM | POA: Diagnosis not present

## 2020-05-21 DIAGNOSIS — J9811 Atelectasis: Secondary | ICD-10-CM | POA: Diagnosis not present

## 2020-05-21 DIAGNOSIS — J90 Pleural effusion, not elsewhere classified: Secondary | ICD-10-CM | POA: Diagnosis not present

## 2020-05-21 DIAGNOSIS — I82412 Acute embolism and thrombosis of left femoral vein: Secondary | ICD-10-CM | POA: Diagnosis present

## 2020-05-21 DIAGNOSIS — Z87891 Personal history of nicotine dependence: Secondary | ICD-10-CM | POA: Diagnosis not present

## 2020-05-21 DIAGNOSIS — R1310 Dysphagia, unspecified: Secondary | ICD-10-CM | POA: Diagnosis not present

## 2020-05-21 DIAGNOSIS — R1312 Dysphagia, oropharyngeal phase: Secondary | ICD-10-CM | POA: Diagnosis not present

## 2020-05-21 DIAGNOSIS — M6281 Muscle weakness (generalized): Secondary | ICD-10-CM | POA: Diagnosis not present

## 2020-05-21 DIAGNOSIS — A0472 Enterocolitis due to Clostridium difficile, not specified as recurrent: Secondary | ICD-10-CM | POA: Diagnosis present

## 2020-05-21 DIAGNOSIS — I2609 Other pulmonary embolism with acute cor pulmonale: Secondary | ICD-10-CM | POA: Diagnosis not present

## 2020-05-21 DIAGNOSIS — Z4682 Encounter for fitting and adjustment of non-vascular catheter: Secondary | ICD-10-CM | POA: Diagnosis not present

## 2020-05-21 DIAGNOSIS — Z7901 Long term (current) use of anticoagulants: Secondary | ICD-10-CM | POA: Diagnosis not present

## 2020-05-21 DIAGNOSIS — I50810 Right heart failure, unspecified: Secondary | ICD-10-CM | POA: Diagnosis present

## 2020-05-21 DIAGNOSIS — I82402 Acute embolism and thrombosis of unspecified deep veins of left lower extremity: Secondary | ICD-10-CM | POA: Diagnosis not present

## 2020-05-21 DIAGNOSIS — T380X5A Adverse effect of glucocorticoids and synthetic analogues, initial encounter: Secondary | ICD-10-CM | POA: Diagnosis not present

## 2020-05-21 DIAGNOSIS — I480 Paroxysmal atrial fibrillation: Secondary | ICD-10-CM | POA: Diagnosis present

## 2020-05-21 DIAGNOSIS — Z681 Body mass index (BMI) 19 or less, adult: Secondary | ICD-10-CM | POA: Diagnosis not present

## 2020-05-21 DIAGNOSIS — R4182 Altered mental status, unspecified: Secondary | ICD-10-CM | POA: Diagnosis not present

## 2020-05-21 DIAGNOSIS — M47816 Spondylosis without myelopathy or radiculopathy, lumbar region: Secondary | ICD-10-CM | POA: Diagnosis not present

## 2020-05-21 HISTORY — DX: COVID-19: U07.1

## 2020-05-21 LAB — FIBRINOGEN: Fibrinogen: 601 mg/dL — ABNORMAL HIGH (ref 210–475)

## 2020-05-21 LAB — D-DIMER, QUANTITATIVE: D-Dimer, Quant: >20 ug/mL-FEU — ABNORMAL HIGH (ref 0.00–0.50)

## 2020-05-21 LAB — HIV ANTIBODY (ROUTINE TESTING W REFLEX): HIV Screen 4th Generation wRfx: NONREACTIVE

## 2020-05-21 LAB — TROPONIN I (HIGH SENSITIVITY)
Troponin I (High Sensitivity): 8 ng/L (ref <18)
Troponin I (High Sensitivity): 7 ng/L (ref <18)

## 2020-05-21 LAB — PROCALCITONIN: Procalcitonin: <0.1 ng/mL

## 2020-05-21 LAB — LACTIC ACID, PLASMA: Lactic Acid, Venous: 1.1 mmol/L (ref 0.5–1.9)

## 2020-05-21 LAB — C-REACTIVE PROTEIN: CRP: 9.6 mg/dL — ABNORMAL HIGH (ref <1.0)

## 2020-05-21 LAB — RESPIRATORY PANEL BY RT PCR (FLU A&B, COVID)
Influenza A by PCR: NEGATIVE
Influenza B by PCR: NEGATIVE
SARS Coronavirus 2 by RT PCR: POSITIVE — AB

## 2020-05-21 LAB — BRAIN NATRIURETIC PEPTIDE: B Natriuretic Peptide: 73.5 pg/mL (ref 0.0–100.0)

## 2020-05-21 LAB — LACTATE DEHYDROGENASE: LDH: 166 U/L (ref 98–192)

## 2020-05-21 LAB — FERRITIN: Ferritin: 435 ng/mL — ABNORMAL HIGH (ref 24–336)

## 2020-05-21 MED ORDER — HEPARIN BOLUS VIA INFUSION
4000.0000 [IU] | Freq: Once | INTRAVENOUS | Status: AC
  Administered 2020-05-21: 4000 [IU] via INTRAVENOUS
  Filled 2020-05-21: qty 4000

## 2020-05-21 MED ORDER — SODIUM CHLORIDE 0.9 % IV SOLN: 250.0000 mL | INTRAVENOUS | Status: DC | PRN

## 2020-05-21 MED ORDER — SODIUM CHLORIDE 0.9 % IV SOLN
200.0000 mg | Freq: Once | INTRAVENOUS | Status: DC
  Filled 2020-05-21: qty 40

## 2020-05-21 MED ORDER — ACETAMINOPHEN 325 MG PO TABS: 650.0000 mg | ORAL_TABLET | Freq: Four times a day (QID) | ORAL | Status: DC | PRN

## 2020-05-21 MED ORDER — METHYLPREDNISOLONE SODIUM SUCC 40 MG IJ SOLR
0.5000 mg/kg | Freq: Two times a day (BID) | INTRAMUSCULAR | Status: AC
  Administered 2020-05-21 – 2020-05-23 (×6): 32.8 mg via INTRAVENOUS
  Filled 2020-05-21 (×6): qty 1

## 2020-05-21 MED ORDER — ENSURE ENLIVE PO LIQD
237.0000 mL | Freq: Two times a day (BID) | ORAL | Status: DC
  Administered 2020-05-21: 237 mL via ORAL

## 2020-05-21 MED ORDER — ALBUTEROL SULFATE HFA 108 (90 BASE) MCG/ACT IN AERS
2.0000 | INHALATION_SPRAY | RESPIRATORY_TRACT | Status: DC | PRN
  Filled 2020-05-21: qty 6.7

## 2020-05-21 MED ORDER — ONDANSETRON HCL 4 MG PO TABS
4.0000 mg | ORAL_TABLET | Freq: Four times a day (QID) | ORAL | Status: DC | PRN
  Administered 2020-05-22: 4 mg via ORAL
  Filled 2020-05-21: qty 1

## 2020-05-21 MED ORDER — FLEET ENEMA 7-19 GM/118ML RE ENEM: 1.0000 | ENEMA | Freq: Once | RECTAL | Status: DC | PRN

## 2020-05-21 MED ORDER — ONDANSETRON HCL 4 MG/2ML IJ SOLN
4.0000 mg | Freq: Four times a day (QID) | INTRAMUSCULAR | Status: DC | PRN
  Filled 2020-05-21: qty 2

## 2020-05-21 MED ORDER — METRONIDAZOLE 500 MG PO TABS
500.0000 mg | ORAL_TABLET | Freq: Once | ORAL | Status: AC
  Administered 2020-05-21: 500 mg via ORAL
  Filled 2020-05-21: qty 1

## 2020-05-21 MED ORDER — HEPARIN (PORCINE) 25000 UT/250ML-% IV SOLN
1200.0000 [IU]/h | INTRAVENOUS | Status: DC
  Administered 2020-05-21: 1200 [IU]/h via INTRAVENOUS
  Filled 2020-05-21: qty 250

## 2020-05-21 MED ORDER — SODIUM CHLORIDE 0.9% FLUSH: 3.0000 mL | INTRAVENOUS | Status: DC | PRN

## 2020-05-21 MED ORDER — POLYETHYLENE GLYCOL 3350 17 G PO PACK
17.0000 g | PACK | Freq: Every day | ORAL | Status: DC | PRN
  Administered 2020-05-22: 17 g via ORAL
  Filled 2020-05-21: qty 1

## 2020-05-21 MED ORDER — HEPARIN SODIUM (PORCINE) 5000 UNIT/ML IJ SOLN
INTRAMUSCULAR | Status: AC
  Filled 2020-05-21: qty 1

## 2020-05-21 MED ORDER — BISACODYL 5 MG PO TBEC: 5.0000 mg | DELAYED_RELEASE_TABLET | Freq: Every day | ORAL | Status: DC | PRN

## 2020-05-21 MED ORDER — ENSURE ENLIVE PO LIQD
237.0000 mL | Freq: Two times a day (BID) | ORAL | Status: DC
  Administered 2020-05-22 (×2): 237 mL via ORAL

## 2020-05-21 MED ORDER — ENOXAPARIN SODIUM 40 MG/0.4ML ~~LOC~~ SOLN
40.0000 mg | SUBCUTANEOUS | Status: DC
  Administered 2020-05-21: 40 mg via SUBCUTANEOUS
  Filled 2020-05-21: qty 0.4

## 2020-05-21 MED ORDER — GUAIFENESIN-DM 100-10 MG/5ML PO SYRP: 10.0000 mL | ORAL_SOLUTION | ORAL | Status: DC | PRN

## 2020-05-21 MED ORDER — OXYCODONE HCL 5 MG PO TABS: 5.0000 mg | ORAL_TABLET | ORAL | Status: DC | PRN

## 2020-05-21 MED ORDER — SODIUM CHLORIDE 0.9% FLUSH
3.0000 mL | Freq: Two times a day (BID) | INTRAVENOUS | Status: DC
  Administered 2020-05-21 – 2020-05-25 (×4): 3 mL via INTRAVENOUS

## 2020-05-21 MED ORDER — PREDNISONE 20 MG PO TABS
50.0000 mg | ORAL_TABLET | Freq: Every day | ORAL | Status: DC
  Administered 2020-05-24: 50 mg via ORAL
  Filled 2020-05-21: qty 1

## 2020-05-21 MED ORDER — SODIUM CHLORIDE 0.9 % IV SOLN
100.0000 mg | Freq: Every day | INTRAVENOUS | Status: AC
  Administered 2020-05-22 – 2020-05-25 (×4): 100 mg via INTRAVENOUS
  Filled 2020-05-21 (×4): qty 20

## 2020-05-21 MED ORDER — HYDROCOD POLST-CPM POLST ER 10-8 MG/5ML PO SUER
5.0000 mL | Freq: Two times a day (BID) | ORAL | Status: DC | PRN
  Administered 2020-05-21: 5 mL via ORAL
  Filled 2020-05-21: qty 5

## 2020-05-21 MED ORDER — IOHEXOL 300 MG/ML  SOLN
75.0000 mL | Freq: Once | INTRAMUSCULAR | Status: AC | PRN
  Administered 2020-05-21: 75 mL via INTRAVENOUS

## 2020-05-21 NOTE — ED Notes (Signed)
Lunch Tray Ordered @ 1000. 

## 2020-05-21 NOTE — TOC Initial Note (Signed)
Transition of Care [facility]) - Initial/Assessment Note    Patient Details  Name: Dwayne Barnes MRN: [id number] Date of Birth: 03/25/1951  Transition of [facility]) CM/SW Contact:    Lockie Pares, RN Phone Number: 05/21/2020, 1:56 PM  Clinical Narrative:                 Admitted with COVID, colitis, UTI. Patient having lower back apin having trouble ambulating. Had URI last week, now nausea vomiting. COVID positive. Has Daughter,  PT consulted for recommendations will follow through hospital course. Hopefully will gain ambulation back with treatments. Was very active, walking treadmill 3 x a week. May need Home Health. CM will follow for needs.    Expected Discharge Plan: Home w Home Health Services Barriers to Discharge: Continued Medical Work up   Patient Goals and CMS Choice        Expected Discharge Plan and Services Expected Discharge Plan: Home w Home Health Services   Discharge Planning Services: CM Consult   Living arrangements for the past 2 months: Single Family Home                                      Prior Living Arrangements/Services Living arrangements for the past 2 months: Single Family Home   Patient language and need for interpreter reviewed:: Yes        Need for Family Participation in Patient Care: Yes (Comment) Care giver support system in place?: Yes (comment)   Criminal Activity/Legal Involvement Pertinent to Current Situation/Hospitalization: No - Comment as needed  Activities of Daily Living Home Assistive Devices/Equipment: None ADL Screening (condition at time of admission) Patient's cognitive ability adequate to safely complete daily activities?: No Is the patient deaf or have difficulty hearing?: Yes Does the patient have difficulty seeing, even when wearing glasses/contacts?: No Does the patient have difficulty concentrating, remembering, or making decisions?: Yes Patient able to express need for assistance with ADLs?:  Yes Does the patient have difficulty dressing or bathing?: Yes Independently performs ADLs?: No Communication: Independent Dressing (OT): Needs assistance Is this a change from baseline?: Pre-admission baseline Grooming: Independent with device (comment) Feeding: Independent Bathing: Needs assistance Is this a change from baseline?: Pre-admission baseline Toileting: Needs assistance Is this a change from baseline?: Pre-admission baseline In/Out Bed: Needs assistance Is this a change from baseline?: Pre-admission baseline Walks in Home: Independent Does the patient have difficulty walking or climbing stairs?: Yes Weakness of Legs: Right Weakness of Arms/Hands: Left  Permission Sought/Granted                  Emotional Assessment       Orientation: : Oriented to Self, Oriented to Place, Oriented to  Time, Oriented to Situation Alcohol / Substance Use: Not Applicable Psych Involvement: No (comment)  Admission diagnosis:  Colitis [K52.9] Generalized weakness [R53.1] Urinary tract infection without hematuria, site unspecified [N39.0] Pneumonia due to COVID-19 virus [U07.1, J12.82] Patient Active Problem List   Diagnosis Date Noted  . Generalized weakness 05/21/2020   PCP:  [name], PA-C Pharmacy:   [facility] [phone] - [place], [place] - [address] [place] [address] Phone: [phone] Fax: [phone]     Social Determinants of Health (SDOH) Interventions    Readmission Risk Interventions No flowsheet data found.

## 2020-05-21 NOTE — H&P (Signed)
History and Physical    Dwayne Barnes ZOX:[id number] DOB: 11/23/1950 DOA: 05/20/2020  PCP: Dwayne DanielBeal, Sheri, PA-C Consultants:  None Patient coming from:  Home - lives alone; NOK: Daughter, Dwayne BertinJacklin Barnes, [phone]  Chief Complaint: Back pain, weakness, n/v  HPI: Dwayne Barnes is a 69 y.o. male without known medical history presenting with n/v, weakness, and back pain.  He reports onset of LBP last week with n/v developing over the week and poor PO intake.   He does not usually need a cane or walker but has been too weak to stand.  He had URI symptoms last week.      ED Course:  Carryover, per Dr. Margo Barnes:  Patient presents to the emergency department with complaints of back pain and generalized weakness x 5 days. Associated with nausea and vomiting, has not been able to eat anything. Work up in the ED revealed +UA for pyuria and +COVID-19 screen test.   Review of Systems: As per HPI; otherwise review of systems reviewed and negative.   Ambulatory Status:  Ambulates without assistance  COVID Vaccine Status:  None vs. J&J x 1 at the pharmacy  History reviewed. No pertinent past medical history.  History reviewed. No pertinent surgical history.  Social History   Socioeconomic History  . Marital status: Widowed    Spouse name: Not on file  . Number of children: Not on file  . Years of education: Not on file  . Highest education level: Not on file  Occupational History  . Occupation: Unemployed  Tobacco Use  . Smoking status: Former Games developermoker  . Smokeless tobacco: Never Used  Vaping Use  . Vaping Use: Never used  Substance and Sexual Activity  . Alcohol use: No  . Drug use: No  . Sexual activity: Not on file  Other Topics Concern  . Not on file  Social History Narrative  . Not on file   Social Determinants of Health   Financial Resource Strain:   . Difficulty of Paying Living Expenses: Not on file  Food Insecurity:   . Worried About Programme researcher, broadcasting/film/videounning Out of Food in the Last  Year: Not on file  . Ran Out of Food in the Last Year: Not on file  Transportation Needs:   . Lack of Transportation (Medical): Not on file  . Lack of Transportation (Non-Medical): Not on file  Physical Activity:   . Days of Exercise per Week: Not on file  . Minutes of Exercise per Session: Not on file  Stress:   . Feeling of Stress : Not on file  Social Connections:   . Frequency of Communication with Friends and Family: Not on file  . Frequency of Social Gatherings with Friends and Family: Not on file  . Attends Religious Services: Not on file  . Active Member of Clubs or Organizations: Not on file  . Attends BankerClub or Organization Meetings: Not on file  . Marital Status: Not on file  Intimate Partner Violence:   . Fear of Current or Ex-Partner: Not on file  . Emotionally Abused: Not on file  . Physically Abused: Not on file  . Sexually Abused: Not on file    No Known Allergies  History reviewed. No pertinent family history.  Prior to Admission medications   Medication Sig Start Date End Date Taking? Authorizing Provider  cephALEXin (KEFLEX) 500 MG capsule Take 1 capsule (500 mg total) by mouth 4 (four) times daily. 12/20/14   Charlynne PanderYao, David Hsienta, MD  clindamycin (CLEOCIN) 300 MG  capsule Take 1 capsule (300 mg total) by mouth 3 (three) times daily. X 7 days 12/20/14   Charlynne Pander, MD  CREATINE PO Take 1 tablet by mouth 3 (three) times daily.    [provider]  guaiFENesin (MUCINEX) 600 MG 12 hr tablet Take 600 mg by mouth 2 (two) times daily as needed for cough or to loosen phlegm.    [provider]  hydrocortisone cream 1 % Apply 1 application topically as needed (for bite).     [provider]  ibuprofen (ADVIL,MOTRIN) 200 MG tablet Take 400 mg by mouth 2 (two) times daily as needed for moderate pain.    [provider]  Multiple Vitamins-Minerals (ALIVE MENS ENERGY) TABS Take 1 tablet by mouth daily.    [provider]   Neomycin-Bacitracin-Polymyxin (NEOSPORIN ORIGINAL) [phone] OINT Apply 1 application topically as needed (for bite).    [provider]  Omega-3 Fatty Acids (OMEGA 3 PO) Take 1 capsule by mouth daily.    [provider]  OVER THE COUNTER MEDICATION Apply 1 application topically as needed (for bite).    [provider]  OVER THE COUNTER MEDICATION Take 1 tablet by mouth daily. "ANDROZONE" TESTOSTERONE SUPPLEMENT    [provider]    Physical Exam: Vitals:   05/21/20 1025 05/21/20 1115 05/21/20 1204 05/21/20 1205  BP:  (!) 144/90 (!) 145/91   Pulse:  77 77   Resp:  15 16   Temp: 97.6 F (36.4 C)  98.6 F (37 C) 97.7 F (36.5 C)  TempSrc: Oral  Oral Oral  SpO2:  96%    Weight:   65.8 kg   Height:   6' (1.829 m)      . General:  Appears calm and comfortable and is NAD, on RA . Eyes:  PERRL, EOMI, normal lids, iris . ENT:  Hard of hearing, grossly normal lips & tongue, mmm; edentulous  . Neck:  no LAD, masses or thyromegaly . Cardiovascular:  RRR, no m/r/g. No LE edema.  Marland Kitchen Respiratory:   CTA bilaterally with no wheezes/rales/rhonchi.  Normal respiratory effort. . Abdomen:  soft, NT, ND, NABS . Back:   normal alignment, no spinal TTP . Skin:  no rash or induration seen on limited exam . Musculoskeletal: decreased tone BUE < BLE, good ROM, no bony abnormality . Psychiatric:  grossly normal mood and affect, speech fluent and appropriate, AOx3; hearing vs. Cognitive deficits . Neurologic:  CN 2-12 grossly intact, moves all extremities in coordinated fashion    Radiological Exams on Admission: Independently reviewed - see discussion in A/P where applicable  DG Chest Port 1 View  Result Date: 05/21/2020 CLINICAL DATA:  Cough, lower back pain, has not eaten 5 days, emesis EXAM: PORTABLE CHEST 1 VIEW COMPARISON:  Radiograph 04/19/2010, CT 04/18/2010 FINDINGS: Heterogeneous opacities present predominantly along the periphery of the left lung  and minimally in the right lung base as well. No pneumothorax or effusion. The aorta is calcified. The remaining cardiomediastinal contours are unremarkable. No acute osseous or soft tissue abnormality. Degenerative changes are present in the imaged spine and shoulders. Telemetry leads overlie the chest. IMPRESSION: Heterogeneous opacities throughout the lungs, greatest in the left lung periphery concerning for pneumonia including potential viral etiology. Aortic Atherosclerosis (ICD10-I70.0). Electronically Signed   By: Kreg Shropshire M.D.   On: 05/21/2020 03:50   CT RENAL STONE STUDY  Result Date: 05/21/2020 CLINICAL DATA:  69 year old male with low back pain, flank pain, decreased p.o. EXAM:  CT ABDOMEN AND PELVIS WITHOUT CONTRAST TECHNIQUE: Multidetector CT imaging of the abdomen and pelvis was performed following the standard protocol without IV contrast. COMPARISON:  Noncontrast CT Abdomen and Pelvis 10/28/2008. Portable chest radiograph 04/19/2010. FINDINGS: Lower chest: Patchy, irregular peribronchial and peripheral scattered pulmonary opacity at both lung bases. This seems to be new from last month. Underlying probable pulmonary hyperinflation. Some associated lung base bronchiectasis. No cavitating areas identified. No cardiomegaly, pericardial effusion or pleural effusion. Hepatobiliary: Negative noncontrast liver and gallbladder. Pancreas: Negative. Spleen: Negative. Adrenals/Urinary Tract: Bulky left nephrolithiasis measuring 13 mm at the renal pelvis. Additional left lower pole stone. No hydronephrosis. Negative noncontrast right kidney. No hydroureter. Unremarkable urinary bladder. Incidental pelvic phleboliths. Stomach/Bowel: Decompressed large bowel. There is long segment circumferential wall thickening in the right colon, up to the 12 mm series 4, image 49) with some areas of adjacent mesenteric stranding (coronal image 43). The wall thickening gradually decreases through the transverse colon. No  free air. No free fluid. The terminal ileum appear spared. Appendix seems to remain normal on coronal image 32. No dilated small bowel.  Decompressed stomach and duodenum. Vascular/Lymphatic: Normal caliber abdominal aorta. Mild calcified atherosclerosis. Vascular patency is not evaluated in the absence of IV contrast. No lymphadenopathy identified in the absence of contrast. Reproductive: Negative. Other: Previous lower abdominal ventral hernia repair with mesh. No pelvic free fluid. Musculoskeletal: Degenerative changes in the spine. No acute osseous abnormality identified. IMPRESSION: 1. In the abdomen there is circumferential bowel wall thickening and mesenteric stranding involving the right colon. Wall thickening gradually abates through the transverse colon. This is compatible with Acute Nonspecific Colitis. 2. But the lung bases are also abnormal with patchy and irregular peribronchial and peripheral pulmonary opacity suspicious for acute viral/atypical respiratory infection. No areas of cavitation to suggest septic emboli. No pleural effusion. 3. Bulky left nephrolithiasis, but no obstructive uropathy. 4. Previous lower abdominal hernia repair with mesh. Electronically Signed   By: Odessa Fleming M.D.   On: 05/21/2020 02:46    EKG: not done   Labs on Admission: I have personally reviewed the available labs and imaging studies at the time of the admission.  Pertinent labs:   BUN 30/Creatinine 1.06/GFR >60 Bili 1.9 WBC 10.9 Platelets 496 Lactate 1.1 LDH 16 Ferritin 435 CRP 9.6 D-dimer >20 Fibrinogen 601 UA: small Hgb, 20 ketones, large LE, 100 protein, rare bacteria, >50 WBC COVID POSITIVE   Assessment/Plan Principal Problem:   COVID-19 virus infection Active Problems:   Generalized weakness   COVID-19 Infection -Patient with presenting with URI symptoms last week with development of back pain, n/v, and then generalized weakness with inability to ambulate -He does not have a current O2  requirement, but CXR was c/w COVID-related infection -COVID POSITIVE -The patient has comorbidities which may increase the risk for ARDS/MODS including: age -Pertinent labs concerning for COVID include leukopenia/lymphopenia; increased BUN; increased bilirubin; very markedly elevated D-dimer (>>1); increased ferritin; markedly elevated CRP (>7); increased fibrinogen -CXR with multifocal opacities which may be c/w COVID vs. Multifocal PNA -Will not treat with broad-spectrum antibiotics if procalcitonin <0.5 -Will admit for further evaluation, close monitoring, and treatment -Monitor on telemetry x at least 24 hours -At this time, will attempt to avoid use of aerosolized medications and use HFAs instead -Will check daily labs including BMP with Mag, Phos; LFTs; CBC with differential; CRP; ferritin; fibrinogen; D-dimer -Will order steroids and Remdesivir (pharmacy consult) given +COVID test, +CXR -Given his markedly elevated D-dimer (>20), will order CTA to r/o  PE -Will attempt to maintain euvolemia to a net negative fluid status -Will ask the patient to maintain an awake prone position for 16+ hours a day, if possible, with a minimum of 2-3 hours at a time -Patient was seen wearing full PPE including: gown, gloves, head cover, N95, and face shield; donning and doffing was in compliance with current standards -PT/OT consults starting tomorrow.  Possible UTI -Abnormal UA without obvious UTI symptoms -He received 1 dose of Rocephin -May be related to COVID-associated colitis and dehydration -Urine culture is pending -Will hold additional abx at this time  FH of Factor V Leiden Deficiency -Mother died of stroke at an early age -Also with other family members with Factor V Leiden deficiency -Given his highly + D-dimer and COVID, will check CTA (see above)    DVT prophylaxis:  Lovenox  Code Status:  Full - confirmed with patient Family Communication: None present; I spoke with the patient's  daughter by telephone. Disposition Plan:  The patient is from: home  Anticipated d/c is to: home without [facility] services   Anticipated d/c date will depend on clinical response to treatment, likely between 3 days (with completion of outpatient Remdesivir treatment) and 5 days  Patient is currently: acutely ill Consults called: PT/OT/RT/TOC team Admission status: Admit - It is my clinical opinion that admission to INPATIENT is reasonable and necessary because of the expectation that this patient will require hospital care that crosses at least 2 midnights to treat this condition based on the medical complexity of the problems presented.  Given the aforementioned information, the predictability of an adverse outcome is felt to be significant.    Jonah Blue MD [facility]   How to contact the [facility] Attending or Consulting provider 7A - 7P or covering provider during after hours 7P -7A, for this patient?  1. Check the care team in [facility] and look for a) attending/consulting [facility] provider listed and b) the [facility] team listed 2. Log into [email or web address] and use 's universal password to access. If you do not have the password, please contact the hospital operator. 3. Locate the [facility] provider you are looking for under [facility] and page to a number that you can be directly reached. 4. If you still have difficulty reaching the provider, please page the [facility] (Director on Call) for the Hospitalists listed on amion for assistance.   05/21/2020, 2:19 PM

## 2020-05-21 NOTE — ED Provider Notes (Signed)
[facility] EMERGENCY DEPARTMENT Provider Note   CSN: [id number] Arrival date & time: 05/20/20  1925     History Chief Complaint  Patient presents with  . Fatigue    Dwayne Barnes is a 69 y.o. male.  Patient presents to the emergency department with complaints of back pain.  He has been experiencing pain for the last 5 days or so.  Over that period of time he has become very weak.  He reports that he has been experiencing nausea and vomiting and therefore has not been able to eat anything.  He has been trying to drink water but frequently vomits that back up as well.  Patient normally fairly active, able to ambulate without any difficulty, cannot walk because of the weakness currently.  He reports that his back is not currently hurting.        History reviewed. No pertinent past medical history.  There are no problems to display for this patient.   History reviewed. No pertinent surgical history.     No family history on file.  Social History   Tobacco Use  . Smoking status: Former Smoker  Substance Use Topics  . Alcohol use: No  . Drug use: No    Home Medications Prior to Admission medications   Medication Sig Start Date End Date Taking? Authorizing Provider  cephALEXin (KEFLEX) 500 MG capsule Take 1 capsule (500 mg total) by mouth 4 (four) times daily. 12/20/14   Charlynne Pander, MD  clindamycin (CLEOCIN) 300 MG capsule Take 1 capsule (300 mg total) by mouth 3 (three) times daily. X 7 days 12/20/14   Charlynne Pander, MD  CREATINE PO Take 1 tablet by mouth 3 (three) times daily.    [provider]  guaiFENesin (MUCINEX) 600 MG 12 hr tablet Take 600 mg by mouth 2 (two) times daily as needed for cough or to loosen phlegm.    [provider]  hydrocortisone cream 1 % Apply 1 application topically as needed (for bite).     [provider]  ibuprofen (ADVIL,MOTRIN) 200 MG tablet Take 400 mg by mouth 2 (two) times daily  as needed for moderate pain.    [provider]  Multiple Vitamins-Minerals (ALIVE MENS ENERGY) TABS Take 1 tablet by mouth daily.    [provider]  Neomycin-Bacitracin-Polymyxin (NEOSPORIN ORIGINAL) [phone] OINT Apply 1 application topically as needed (for bite).    [provider]  Omega-3 Fatty Acids (OMEGA 3 PO) Take 1 capsule by mouth daily.    [provider]  OVER THE COUNTER MEDICATION Apply 1 application topically as needed (for bite).    [provider]  OVER THE COUNTER MEDICATION Take 1 tablet by mouth daily. "ANDROZONE" TESTOSTERONE SUPPLEMENT    [provider]    Allergies    Patient has no known allergies.  Review of Systems   Review of Systems  Constitutional: Positive for fatigue.  Respiratory: Positive for cough.   Musculoskeletal: Positive for back pain.  All other systems reviewed and are negative.   Physical Exam Updated Vital Signs BP 133/80   Pulse 66   Temp 98.2 F (36.8 C) (Oral)   Resp 14   SpO2 99%   Physical Exam Vitals and nursing note reviewed.  Constitutional:      General: He is not in acute distress.    Appearance: He is underweight.  HENT:     Head: Normocephalic and atraumatic.     Ears:  Comments: Very hard of hearing    Nose: Nose normal.  Eyes:     Conjunctiva/sclera: Conjunctivae normal.     Pupils: Pupils are equal, round, and reactive to light.  Cardiovascular:     Rate and Rhythm: Regular rhythm.     Heart sounds: S1 normal and S2 normal. No murmur heard.  No friction rub. No gallop.   Pulmonary:     Effort: Pulmonary effort is normal. No respiratory distress.     Breath sounds: Normal breath sounds.  Chest:     Chest wall: No tenderness.  Abdominal:     General: Bowel sounds are normal.     Palpations: Abdomen is soft.     Tenderness: There is no abdominal tenderness. There is no guarding or rebound. Negative signs include Murphy's sign and McBurney's sign.      Hernia: No hernia is present.  Musculoskeletal:        General: Normal range of motion.     Cervical back: Normal range of motion and neck supple.  Skin:    General: Skin is warm and dry.     Findings: No rash.  Neurological:     Mental Status: He is alert and oriented to person, place, and time.     GCS: GCS eye subscore is 4. GCS verbal subscore is 5. GCS motor subscore is 6.     Cranial Nerves: No cranial nerve deficit.     Sensory: No sensory deficit.     Coordination: Coordination normal.  Psychiatric:        Speech: Speech normal.        Behavior: Behavior normal.        Thought Content: Thought content normal.     ED Results / Procedures / Treatments   Labs (all labs ordered are listed, but only abnormal results are displayed) Labs Reviewed  RESPIRATORY PANEL BY RT PCR (FLU A&B, COVID) - Abnormal; Notable for the following components:      Result Value   SARS Coronavirus 2 by RT PCR POSITIVE (*)    All other components within normal limits  COMPREHENSIVE METABOLIC PANEL - Abnormal; Notable for the following components:   BUN 30 (*)    Albumin 3.1 (*)    Total Bilirubin 1.9 (*)    All other components within normal limits  CBC - Abnormal; Notable for the following components:   WBC 10.9 (*)    Platelets 496 (*)    All other components within normal limits  URINALYSIS, ROUTINE W REFLEX MICROSCOPIC - Abnormal; Notable for the following components:   Color, Urine AMBER (*)    APPearance CLOUDY (*)    Hgb urine dipstick SMALL (*)    Ketones, ur 20 (*)    Protein, ur 100 (*)    Leukocytes,Ua LARGE (*)    WBC, UA >50 (*)    Bacteria, UA RARE (*)    Non Squamous Epithelial 0-5 (*)    All other components within normal limits  URINE CULTURE  LIPASE, BLOOD  LACTIC ACID, PLASMA    EKG None  Radiology CT RENAL STONE STUDY  Result Date: 05/21/2020 CLINICAL DATA:  69 year old male with low back pain, flank pain, decreased p.o. EXAM: CT ABDOMEN AND PELVIS  WITHOUT CONTRAST TECHNIQUE: Multidetector CT imaging of the abdomen and pelvis was performed following the standard protocol without IV contrast. COMPARISON:  Noncontrast CT Abdomen and Pelvis 10/28/2008. Portable chest radiograph 04/19/2010. FINDINGS: Lower chest: Patchy, irregular peribronchial and peripheral scattered pulmonary opacity at  both lung bases. This seems to be new from last month. Underlying probable pulmonary hyperinflation. Some associated lung base bronchiectasis. No cavitating areas identified. No cardiomegaly, pericardial effusion or pleural effusion. Hepatobiliary: Negative noncontrast liver and gallbladder. Pancreas: Negative. Spleen: Negative. Adrenals/Urinary Tract: Bulky left nephrolithiasis measuring 13 mm at the renal pelvis. Additional left lower pole stone. No hydronephrosis. Negative noncontrast right kidney. No hydroureter. Unremarkable urinary bladder. Incidental pelvic phleboliths. Stomach/Bowel: Decompressed large bowel. There is long segment circumferential wall thickening in the right colon, up to the 12 mm series 4, image 49) with some areas of adjacent mesenteric stranding (coronal image 43). The wall thickening gradually decreases through the transverse colon. No free air. No free fluid. The terminal ileum appear spared. Appendix seems to remain normal on coronal image 32. No dilated small bowel.  Decompressed stomach and duodenum. Vascular/Lymphatic: Normal caliber abdominal aorta. Mild calcified atherosclerosis. Vascular patency is not evaluated in the absence of IV contrast. No lymphadenopathy identified in the absence of contrast. Reproductive: Negative. Other: Previous lower abdominal ventral hernia repair with mesh. No pelvic free fluid. Musculoskeletal: Degenerative changes in the spine. No acute osseous abnormality identified. IMPRESSION: 1. In the abdomen there is circumferential bowel wall thickening and mesenteric stranding involving the right colon. Wall thickening  gradually abates through the transverse colon. This is compatible with Acute Nonspecific Colitis. 2. But the lung bases are also abnormal with patchy and irregular peribronchial and peripheral pulmonary opacity suspicious for acute viral/atypical respiratory infection. No areas of cavitation to suggest septic emboli. No pleural effusion. 3. Bulky left nephrolithiasis, but no obstructive uropathy. 4. Previous lower abdominal hernia repair with mesh. Electronically Signed   By: Odessa Fleming M.D.   On: 05/21/2020 02:46    Procedures Procedures (including critical care time)  Medications Ordered in ED Medications  sodium chloride 0.9 % bolus 1,000 mL (1,000 mLs Intravenous New Bag/Given 05/21/20 0131)  cefTRIAXone (ROCEPHIN) 2 g in sodium chloride 0.9 % 100 mL IVPB (2 g Intravenous New Bag/Given 05/21/20 0134)    ED Course  I have reviewed the triage vital signs and the nursing notes.  Pertinent labs & imaging results that were available during my care of the patient were reviewed by me and considered in my medical decision making (see chart for details).    MDM Rules/Calculators/A&P                          Patient presents for generalized weakness, nausea and vomiting with low back pain.  Patient extremely weak, had difficulty getting from the wheelchair to the ER gurney without assistance.  He reports that normally he walks on the treadmill 30 minutes 3 times a week, this is a significant change in his strength level.  No focal neurologic findings on exam.  He is weak all over but no out of proportion lower extremity or unilateral weakness.  Back is not hurting currently.  Urinalysis did suggest infection.  Culture sent, initiated on Rocephin.  Patient sent for CT to further evaluate his back pain to rule out spinal lesions as well as kidney stone.  He does have a large stone in the left kidney but it is nonobstructive.  Patient does have evidence of colitis on exam. Will add flagyl to rocephin for  coverage.  Additionally there are findings of viral pneumonia at the lung bases.  His Covid test has returned positive.  Further discussion with his sister reveals that just over a week ago  he had some type of upper respiratory infection with a significant cough.  He did not see a doctor at that time, treated himself with cough medicine.   Final Clinical Impression(s) / ED Diagnoses Final diagnoses:  Pneumonia due to COVID-19 virus  Urinary tract infection without hematuria, site unspecified  Colitis    Rx / DC Orders ED Discharge Orders    None       Yaniv Lage, Canary Brimhristopher J, MD 05/21/20 0321

## 2020-05-21 NOTE — Progress Notes (Signed)
ANTICOAGULATION CONSULT NOTE - Follow Up Consult  Pharmacy Consult for Heparin Indication: pulmonary embolus in setting of Covid  No Known Allergies  Patient Measurements: Height: 6' (182.9 cm) Weight: 65.8 kg (145 lb 1 oz) IBW/kg (Calculated) : 77.6  Vital Signs: Temp: 97.8 F (36.6 C) (11/18 1958) Temp Source: Axillary (11/18 1958) BP: 110/64 (11/18 1958) Pulse Rate: 94 (11/18 1958)  Labs: Recent Labs    05/20/20 1951  HGB 14.3  HCT 45.5  PLT 496*  CREATININE 1.06    Estimated Creatinine Clearance: 62.1 mL/min (by C-G formula based on SCr of 1.06 mg/dL).  Assessment: 69 year old male with COVID and PE to begin heparin  Goal of Therapy:  Heparin level 0.3-0.7 units/ml Monitor platelets by anticoagulation protocol: Yes   Plan:  Heparin 4000 units iv bolus x 1 Heparin drip at 1200 units / hr Heparin and CBC 6 hours after heparin begins Daily heparin level, CBC  Thank you Okey Regal, PharmD [phone]  05/21/2020,8:05 PM

## 2020-05-22 ENCOUNTER — Inpatient Hospital Stay (HOSPITAL_COMMUNITY): Payer: Medicare HMO

## 2020-05-22 ENCOUNTER — Inpatient Hospital Stay (HOSPITAL_COMMUNITY): Payer: Medicare HMO | Admitting: Anesthesiology

## 2020-05-22 DIAGNOSIS — U071 COVID-19: Secondary | ICD-10-CM | POA: Diagnosis not present

## 2020-05-22 DIAGNOSIS — I2699 Other pulmonary embolism without acute cor pulmonale: Secondary | ICD-10-CM

## 2020-05-22 LAB — COMPREHENSIVE METABOLIC PANEL
ALT: 23 U/L (ref 0–44)
AST: 27 U/L (ref 15–41)
Albumin: 2.5 g/dL — ABNORMAL LOW (ref 3.5–5.0)
Alkaline Phosphatase: 70 U/L (ref 38–126)
Anion gap: 7 (ref 5–15)
BUN: 21 mg/dL (ref 8–23)
CO2: 23 mmol/L (ref 22–32)
Calcium: 8.8 mg/dL — ABNORMAL LOW (ref 8.9–10.3)
Chloride: 108 mmol/L (ref 98–111)
Creatinine, Ser: 0.75 mg/dL (ref 0.61–1.24)
GFR, Estimated: >60 mL/min (ref >60)
Glucose, Bld: 137 mg/dL — ABNORMAL HIGH (ref 70–99)
Potassium: 4.3 mmol/L (ref 3.5–5.1)
Sodium: 138 mmol/L (ref 135–145)
Total Bilirubin: 0.7 mg/dL (ref 0.3–1.2)
Total Protein: 5.9 g/dL — ABNORMAL LOW (ref 6.5–8.1)

## 2020-05-22 LAB — URINE CULTURE: Culture: <10000 — AB

## 2020-05-22 LAB — CBC WITH DIFFERENTIAL/PLATELET
Abs Immature Granulocytes: 0.1 10*3/uL — ABNORMAL HIGH (ref 0.00–0.07)
Abs Immature Granulocytes: 0 10*3/uL (ref 0.00–0.07)
Basophils Absolute: 0 10*3/uL (ref 0.0–0.1)
Basophils Absolute: 0 10*3/uL (ref 0.0–0.1)
Basophils Relative: 0 %
Basophils Relative: 0 %
Eosinophils Absolute: 0 10*3/uL (ref 0.0–0.5)
Eosinophils Absolute: 0 10*3/uL (ref 0.0–0.5)
Eosinophils Relative: 0 %
Eosinophils Relative: 0 %
HCT: 36.6 % — ABNORMAL LOW (ref 39.0–52.0)
HCT: 32.9 % — ABNORMAL LOW (ref 39.0–52.0)
Hemoglobin: 12 g/dL — ABNORMAL LOW (ref 13.0–17.0)
Hemoglobin: 10.4 g/dL — ABNORMAL LOW (ref 13.0–17.0)
Immature Granulocytes: 1 %
Lymphocytes Relative: 7 %
Lymphocytes Relative: 4 %
Lymphs Abs: 0.9 10*3/uL (ref 0.7–4.0)
Lymphs Abs: 1.1 10*3/uL (ref 0.7–4.0)
MCH: 29.3 pg (ref 26.0–34.0)
MCH: 29.1 pg (ref 26.0–34.0)
MCHC: 32.8 g/dL (ref 30.0–36.0)
MCHC: 31.6 g/dL (ref 30.0–36.0)
MCV: 89.3 fL (ref 80.0–100.0)
MCV: 92.2 fL (ref 80.0–100.0)
Monocytes Absolute: 0.2 10*3/uL (ref 0.1–1.0)
Monocytes Absolute: 0.8 10*3/uL (ref 0.1–1.0)
Monocytes Relative: 2 %
Monocytes Relative: 3 %
Neutro Abs: 11.6 10*3/uL — ABNORMAL HIGH (ref 1.7–7.7)
Neutro Abs: 24.6 10*3/uL — ABNORMAL HIGH (ref 1.7–7.7)
Neutrophils Relative %: 90 %
Neutrophils Relative %: 93 %
Platelets: 395 10*3/uL (ref 150–400)
Platelets: 423 10*3/uL — ABNORMAL HIGH (ref 150–400)
RBC: 4.1 MIL/uL — ABNORMAL LOW (ref 4.22–5.81)
RBC: 3.57 MIL/uL — ABNORMAL LOW (ref 4.22–5.81)
RDW: 13.2 % (ref 11.5–15.5)
RDW: 13.2 % (ref 11.5–15.5)
WBC: 12.9 10*3/uL — ABNORMAL HIGH (ref 4.0–10.5)
WBC: 26.4 10*3/uL — ABNORMAL HIGH (ref 4.0–10.5)
nRBC: 0 % (ref 0.0–0.2)
nRBC: 0 % (ref 0.0–0.2)
nRBC: 0 /100 WBC

## 2020-05-22 LAB — HEPARIN LEVEL (UNFRACTIONATED)
Heparin Unfractionated: >2.2 IU/mL — ABNORMAL HIGH (ref 0.30–0.70)
Heparin Unfractionated: 0.11 IU/mL — ABNORMAL LOW (ref 0.30–0.70)

## 2020-05-22 LAB — BLOOD GAS, VENOUS
Acid-base deficit: 9.5 mmol/L — ABNORMAL HIGH (ref 0.0–2.0)
Bicarbonate: 18.1 mmol/L — ABNORMAL LOW (ref 20.0–28.0)
FIO2: 100
O2 Saturation: 96.4 %
Patient temperature: 36.9
pCO2, Ven: 56.3 mmHg (ref 44.0–60.0)
pH, Ven: 7.133 — CL (ref 7.250–7.430)
pO2, Ven: 113 mmHg — ABNORMAL HIGH (ref 32.0–45.0)

## 2020-05-22 LAB — GLUCOSE, CAPILLARY
Glucose-Capillary: 180 mg/dL — ABNORMAL HIGH (ref 70–99)
Glucose-Capillary: 202 mg/dL — ABNORMAL HIGH (ref 70–99)
Glucose-Capillary: 289 mg/dL — ABNORMAL HIGH (ref 70–99)
Glucose-Capillary: 350 mg/dL — ABNORMAL HIGH (ref 70–99)

## 2020-05-22 LAB — BASIC METABOLIC PANEL
Anion gap: 20 — ABNORMAL HIGH (ref 5–15)
Anion gap: 14 (ref 5–15)
BUN: 25 mg/dL — ABNORMAL HIGH (ref 8–23)
BUN: 32 mg/dL — ABNORMAL HIGH (ref 8–23)
CO2: 18 mmol/L — ABNORMAL LOW (ref 22–32)
CO2: 20 mmol/L — ABNORMAL LOW (ref 22–32)
Calcium: 8 mg/dL — ABNORMAL LOW (ref 8.9–10.3)
Calcium: 8.2 mg/dL — ABNORMAL LOW (ref 8.9–10.3)
Chloride: 99 mmol/L (ref 98–111)
Chloride: 103 mmol/L (ref 98–111)
Creatinine, Ser: 1.73 mg/dL — ABNORMAL HIGH (ref 0.61–1.24)
Creatinine, Ser: 1.73 mg/dL — ABNORMAL HIGH (ref 0.61–1.24)
GFR, Estimated: 42 mL/min — ABNORMAL LOW (ref >60)
GFR, Estimated: 42 mL/min — ABNORMAL LOW (ref >60)
Glucose, Bld: 455 mg/dL — ABNORMAL HIGH (ref 70–99)
Glucose, Bld: 404 mg/dL — ABNORMAL HIGH (ref 70–99)
Potassium: 3.3 mmol/L — ABNORMAL LOW (ref 3.5–5.1)
Potassium: 3.3 mmol/L — ABNORMAL LOW (ref 3.5–5.1)
Sodium: 137 mmol/L (ref 135–145)
Sodium: 137 mmol/L (ref 135–145)

## 2020-05-22 LAB — POCT I-STAT 7, (LYTES, BLD GAS, ICA,H+H)
Acid-base deficit: 2 mmol/L (ref 0.0–2.0)
Bicarbonate: 25.6 mmol/L (ref 20.0–28.0)
Calcium, Ion: 1.2 mmol/L (ref 1.15–1.40)
HCT: 33 % — ABNORMAL LOW (ref 39.0–52.0)
Hemoglobin: 11.2 g/dL — ABNORMAL LOW (ref 13.0–17.0)
O2 Saturation: 100 %
Patient temperature: 98.6
Potassium: 3.5 mmol/L (ref 3.5–5.1)
Sodium: 135 mmol/L (ref 135–145)
TCO2: 27 mmol/L (ref 22–32)
pCO2 arterial: 57.5 mmHg — ABNORMAL HIGH (ref 32.0–48.0)
pH, Arterial: 7.256 — ABNORMAL LOW (ref 7.350–7.450)
pO2, Arterial: 421 mmHg — ABNORMAL HIGH (ref 83.0–108.0)

## 2020-05-22 LAB — TROPONIN I (HIGH SENSITIVITY)
Troponin I (High Sensitivity): 364 ng/L (ref <18)
Troponin I (High Sensitivity): 2480 ng/L (ref <18)

## 2020-05-22 LAB — PROTIME-INR
INR: 1.8 — ABNORMAL HIGH (ref 0.8–1.2)
Prothrombin Time: 20.4 seconds — ABNORMAL HIGH (ref 11.4–15.2)

## 2020-05-22 LAB — C-REACTIVE PROTEIN: CRP: 13.3 mg/dL — ABNORMAL HIGH (ref <1.0)

## 2020-05-22 LAB — MAGNESIUM: Magnesium: 2.1 mg/dL (ref 1.7–2.4)

## 2020-05-22 LAB — HEMOGLOBIN A1C
Hgb A1c MFr Bld: 5.5 % (ref 4.8–5.6)
Mean Plasma Glucose: 111.15 mg/dL

## 2020-05-22 LAB — D-DIMER, QUANTITATIVE: D-Dimer, Quant: 18.77 ug/mL-FEU — ABNORMAL HIGH (ref 0.00–0.50)

## 2020-05-22 LAB — ABO/RH: ABO/RH(D): A POS

## 2020-05-22 LAB — FERRITIN: Ferritin: 522 ng/mL — ABNORMAL HIGH (ref 24–336)

## 2020-05-22 LAB — LACTIC ACID, PLASMA: Lactic Acid, Venous: 9.6 mmol/L (ref 0.5–1.9)

## 2020-05-22 LAB — PHOSPHORUS: Phosphorus: 3.2 mg/dL (ref 2.5–4.6)

## 2020-05-22 LAB — APTT: aPTT: 50 seconds — ABNORMAL HIGH (ref 24–36)

## 2020-05-22 LAB — LACTATE DEHYDROGENASE: LDH: 416 U/L — ABNORMAL HIGH (ref 98–192)

## 2020-05-22 MED ORDER — HEPARIN (PORCINE) 25000 UT/250ML-% IV SOLN
1050.0000 [IU]/h | INTRAVENOUS | Status: DC
  Administered 2020-05-22: 900 [IU]/h via INTRAVENOUS

## 2020-05-22 MED ORDER — MIDAZOLAM 50MG/50ML (1MG/ML) PREMIX INFUSION
0.5000 mg/h | INTRAVENOUS | Status: DC
  Administered 2020-05-22: 4 mg/h via INTRAVENOUS
  Administered 2020-05-23: 3 mg/h via INTRAVENOUS
  Filled 2020-05-22 (×3): qty 50

## 2020-05-22 MED ORDER — ADULT MULTIVITAMIN W/MINERALS CH
1.0000 | ORAL_TABLET | Freq: Every day | ORAL | Status: DC
  Filled 2020-05-22 (×2): qty 1

## 2020-05-22 MED ORDER — POLYETHYLENE GLYCOL 3350 17 G PO PACK
17.0000 g | PACK | Freq: Every day | ORAL | Status: DC
  Filled 2020-05-22 (×2): qty 1

## 2020-05-22 MED ORDER — SUCCINYLCHOLINE CHLORIDE 20 MG/ML IJ SOLN
INTRAMUSCULAR | Status: DC | PRN
  Administered 2020-05-22: 100 mg via INTRAVENOUS

## 2020-05-22 MED ORDER — DOCUSATE SODIUM 50 MG/5ML PO LIQD
100.0000 mg | Freq: Two times a day (BID) | ORAL | Status: DC
  Filled 2020-05-22 (×3): qty 10

## 2020-05-22 MED ORDER — MIDAZOLAM HCL 2 MG/2ML IJ SOLN
2.0000 mg | Freq: Once | INTRAMUSCULAR | Status: AC
  Administered 2020-05-22: 2 mg via INTRAVENOUS

## 2020-05-22 MED ORDER — PANTOPRAZOLE SODIUM 40 MG IV SOLR
40.0000 mg | Freq: Every day | INTRAVENOUS | Status: DC
  Administered 2020-05-22 – 2020-05-24 (×3): 40 mg via INTRAVENOUS
  Filled 2020-05-22 (×3): qty 40

## 2020-05-22 MED ORDER — VASOPRESSIN 20 UNITS/100 ML INFUSION FOR SHOCK
0.0000 [IU]/min | INTRAVENOUS | Status: DC
  Administered 2020-05-22 – 2020-05-25 (×5): 0.03 [IU]/min via INTRAVENOUS
  Filled 2020-05-22 (×9): qty 100

## 2020-05-22 MED ORDER — MIDAZOLAM HCL 2 MG/2ML IJ SOLN: 2.0000 mg | Freq: Once | INTRAMUSCULAR | Status: AC

## 2020-05-22 MED ORDER — NOREPINEPHRINE 16 MG/250ML-% IV SOLN
0.0000 ug/min | INTRAVENOUS | Status: DC
  Administered 2020-05-22: 2 ug/min via INTRAVENOUS
  Administered 2020-05-23: 18 ug/min via INTRAVENOUS
  Administered 2020-05-24: 11 ug/min via INTRAVENOUS
  Filled 2020-05-22 (×4): qty 250

## 2020-05-22 MED ORDER — MIDAZOLAM HCL 2 MG/2ML IJ SOLN
INTRAMUSCULAR | Status: AC
  Administered 2020-05-22: 2 mg
  Filled 2020-05-22: qty 4

## 2020-05-22 MED ORDER — CHLORHEXIDINE GLUCONATE CLOTH 2 % EX PADS
6.0000 | MEDICATED_PAD | Freq: Every day | CUTANEOUS | Status: DC
  Administered 2020-05-23 – 2020-06-09 (×18): 6 via TOPICAL

## 2020-05-22 MED ORDER — TENECTEPLASE 50 MG IV KIT
35.0000 mg | PACK | INTRAVENOUS | Status: AC
  Administered 2020-05-22: 35 mg via INTRAVENOUS
  Filled 2020-05-22: qty 10

## 2020-05-22 MED ORDER — FENTANYL BOLUS VIA INFUSION
25.0000 ug | INTRAVENOUS | Status: DC | PRN
  Administered 2020-05-24 (×3): 25 ug via INTRAVENOUS
  Filled 2020-05-22: qty 25

## 2020-05-22 MED ORDER — SODIUM CHLORIDE 0.9 % IV SOLN: INTRAVENOUS | Status: DC

## 2020-05-22 MED ORDER — NOREPINEPHRINE 4 MG/250ML-% IV SOLN
INTRAVENOUS | Status: AC
  Filled 2020-05-22: qty 250

## 2020-05-22 MED ORDER — ATROPINE SULFATE 1 MG/10ML IJ SOSY
PREFILLED_SYRINGE | INTRAMUSCULAR | Status: AC
  Filled 2020-05-22: qty 10

## 2020-05-22 MED ORDER — ACETAMINOPHEN 325 MG PO TABS: 650.0000 mg | ORAL_TABLET | Freq: Four times a day (QID) | ORAL | Status: DC | PRN

## 2020-05-22 MED ORDER — DOCUSATE SODIUM 100 MG PO CAPS: 100.0000 mg | ORAL_CAPSULE | Freq: Two times a day (BID) | ORAL | Status: DC | PRN

## 2020-05-22 MED ORDER — ETOMIDATE 2 MG/ML IV SOLN
INTRAVENOUS | Status: DC | PRN
  Administered 2020-05-22: 12 mg via INTRAVENOUS

## 2020-05-22 MED ORDER — HEPARIN (PORCINE) 25000 UT/250ML-% IV SOLN
1350.0000 [IU]/h | INTRAVENOUS | Status: AC
  Administered 2020-05-22: 800 [IU]/h via INTRAVENOUS
  Administered 2020-05-23: 900 [IU]/h via INTRAVENOUS
  Administered 2020-05-24: 1050 [IU]/h via INTRAVENOUS
  Administered 2020-05-25 – 2020-05-26 (×2): 1350 [IU]/h via INTRAVENOUS
  Filled 2020-05-22 (×6): qty 250

## 2020-05-22 MED ORDER — BUTALBITAL-APAP-CAFFEINE 50-325-40 MG PO TABS: 2.0000 | ORAL_TABLET | Freq: Four times a day (QID) | ORAL | Status: DC | PRN

## 2020-05-22 MED ORDER — FENTANYL CITRATE (PF) 100 MCG/2ML IJ SOLN: 25.0000 ug | Freq: Once | INTRAMUSCULAR | Status: DC

## 2020-05-22 MED ORDER — PANTOPRAZOLE SODIUM 40 MG PO TBEC: 40.0000 mg | DELAYED_RELEASE_TABLET | Freq: Every day | ORAL | Status: DC

## 2020-05-22 MED ORDER — PHENYLEPHRINE HCL-NACL 10-0.9 MG/250ML-% IV SOLN
INTRAVENOUS | Status: AC
  Filled 2020-05-22: qty 250

## 2020-05-22 MED ORDER — INSULIN ASPART 100 UNIT/ML ~~LOC~~ SOLN
0.0000 [IU] | SUBCUTANEOUS | Status: DC
  Administered 2020-05-23 (×2): 5 [IU] via SUBCUTANEOUS
  Administered 2020-05-23 (×2): 1 [IU] via SUBCUTANEOUS
  Administered 2020-05-23: 7 [IU] via SUBCUTANEOUS
  Administered 2020-05-23: 2 [IU] via SUBCUTANEOUS
  Administered 2020-05-24 (×3): 1 [IU] via SUBCUTANEOUS
  Administered 2020-05-25: 3 [IU] via SUBCUTANEOUS
  Administered 2020-05-25: 1 [IU] via SUBCUTANEOUS
  Administered 2020-05-26 (×2): 2 [IU] via SUBCUTANEOUS
  Administered 2020-05-26 – 2020-05-29 (×9): 1 [IU] via SUBCUTANEOUS
  Administered 2020-05-29: 2 [IU] via SUBCUTANEOUS
  Administered 2020-05-30 – 2020-06-02 (×8): 1 [IU] via SUBCUTANEOUS
  Administered 2020-06-03 – 2020-06-04 (×2): 2 [IU] via SUBCUTANEOUS
  Administered 2020-06-05: 1 [IU] via SUBCUTANEOUS
  Administered 2020-06-06: 2 [IU] via SUBCUTANEOUS
  Administered 2020-06-06 – 2020-06-07 (×3): 1 [IU] via SUBCUTANEOUS
  Administered 2020-06-07: 2 [IU] via SUBCUTANEOUS
  Administered 2020-06-08 (×2): 1 [IU] via SUBCUTANEOUS

## 2020-05-22 MED ORDER — FENTANYL 2500MCG IN NS 250ML (10MCG/ML) PREMIX INFUSION
25.0000 ug/h | INTRAVENOUS | Status: DC
  Administered 2020-05-22: 150 ug/h via INTRAVENOUS
  Administered 2020-05-23: 100 ug/h via INTRAVENOUS
  Administered 2020-05-24 – 2020-05-25 (×2): 200 ug/h via INTRAVENOUS
  Filled 2020-05-22 (×4): qty 250

## 2020-05-22 MED ORDER — EPINEPHRINE HCL 5 MG/250ML IV SOLN IN NS
0.5000 ug/min | INTRAVENOUS | Status: DC
  Administered 2020-05-22: 20:00:00 10 ug/min via INTRAVENOUS
  Administered 2020-05-23: 05:00:00 8 ug/min via INTRAVENOUS
  Filled 2020-05-22 (×3): qty 250

## 2020-05-22 MED ORDER — NOREPINEPHRINE 4 MG/250ML-% IV SOLN
INTRAVENOUS | Status: AC
  Administered 2020-05-22: 4 mg
  Filled 2020-05-22: qty 250

## 2020-05-22 MED ORDER — DEXMEDETOMIDINE HCL IN NACL 400 MCG/100ML IV SOLN
0.0000 ug/kg/h | INTRAVENOUS | Status: AC
  Administered 2020-05-22 – 2020-05-24 (×2): 0.4 ug/kg/h via INTRAVENOUS
  Filled 2020-05-22 (×4): qty 100

## 2020-05-22 NOTE — Progress Notes (Signed)
PT Cancellation Note  Patient Details Name: Dwayne Barnes MRN: [id number] DOB: 02/21/1951   Cancelled Treatment:    Reason Eval/Treat Not Completed: Medical issues which prohibited therapy - acute PE, heparin started last night and not therapeutic yet. PT to check back tomorrow.   Richrd Sox, PT Acute Rehabilitation Services Pager [phone]  Office [phone]     [name] 05/22/2020, 10:12 AM

## 2020-05-22 NOTE — Anesthesia Procedure Notes (Signed)
Procedure Name: Intubation Date/Time: 05/22/2020 5:11 PM Performed by: Adria Dill, CRNA Pre-anesthesia Checklist: Patient identified, Emergency Drugs available, Suction available and Patient being monitored Patient Re-evaluated:Patient Re-evaluated prior to induction Oxygen Delivery Method: Circle system utilized Preoxygenation: Pre-oxygenation with 100% oxygen Induction Type: IV induction Ventilation: Mask ventilation without difficulty Laryngoscope Size: Glidescope and 4 Tube type: Oral Tube size: 7.5 mm Number of attempts: 1 Airway Equipment and Method: Stylet and Oral airway Placement Confirmation: ETT inserted through vocal cords under direct vision,  positive ETCO2 and breath sounds checked- equal and bilateral Secured at: 23 cm Tube secured with: Tape Dental Injury: Teeth and Oropharynx as per pre-operative assessment

## 2020-05-22 NOTE — Progress Notes (Signed)
ANTICOAGULATION CONSULT NOTE  Pharmacy Consult for Heparin Indication: pulmonary embolus   No Known Allergies  Patient Measurements: Height: 6' (182.9 cm) Weight: 65.8 kg (145 lb 1 oz) IBW/kg (Calculated) : 77.6  Vital Signs: Temp: 98.4 F (36.9 C) (11/19 1158) Temp Source: Oral (11/19 1158) BP: 121/73 (11/19 1158) Pulse Rate: 79 (11/19 1158)  Labs: Recent Labs    05/20/20 1951 05/21/20 1952 05/21/20 2130 05/22/20 0239 05/22/20 1452  HGB 14.3  --   --  12.0*  --   HCT 45.5  --   --  36.6*  --   PLT 496*  --   --  395  --   HEPARINUNFRC  --   --   --  >2.20* 0.11*  CREATININE 1.06  --   --  0.75  --   TROPONINIHS  --  8 7  --   --     Estimated Creatinine Clearance: 82.3 mL/min (by C-G formula based on SCr of 0.75 mg/dL).  Assessment: 69 y.o. male with PE for heparin  Heparin level subtherapeutic at 0.11.  Goal of Therapy:  Heparin level 0.3-0.7 units/ml Monitor platelets by anticoagulation protocol: Yes   Plan:  Increase heparin to 1050 units/hr Check heparin level in 8 hours.  Jeanella Cara, PharmD, [facility] Clinical Pharmacist Please see AMION for all Pharmacists' Contact Phone Numbers 05/22/2020, 4:04 PM

## 2020-05-22 NOTE — Progress Notes (Signed)
Initial Nutrition Assessment  DOCUMENTATION CODES:   Not applicable  INTERVENTION:   -Continue Ensure Enlive po BID, each supplement provides 350 kcal and 20 grams of protein -MVI with minerals daily  NUTRITION DIAGNOSIS:   Increased nutrient needs related to acute illness (COVID-19) as evidenced by estimated needs.  GOAL:   Patient will meet greater than or equal to 90% of their needs  MONITOR:   PO intake, Supplement acceptance, Labs, Weight trends, Skin, I & O's  REASON FOR ASSESSMENT:   Malnutrition Screening Tool    ASSESSMENT:   Dwayne Barnes is a 69 y.o. male without known medical history presenting with n/v, weakness, and back pain.  Pt admitted COVID-19 infection.   Reviewed I/O's: +57 ml x 24 hours  UOP: 550 ml x 24 hours  Attempted to speak with pt via call to hospital room phone, however, no answer.   Per meal completion records, pt with good intake. Noted meal completions 70-100%. However, per H&P, pt with poor oral intake PTA. He is consuming Ensure Enlive supplements.   Pt with increased nutritional needs due to COVID-19 infection and would benefit from continued use of oral nutrition supplements.   Reviewed wt hx; wt has been stable.   Medications reviewed and include remdesivir.   Labs reviewed.   Diet Order:   Diet Order            Diet regular Room service appropriate? Yes; Fluid consistency: Thin  Diet effective now                 EDUCATION NEEDS:   No education needs have been identified at this time  Skin:  Skin Assessment: Reviewed RN Assessment  Last BM:  05/20/20  Height:   Ht Readings from Last 1 Encounters:  05/21/20 6' (1.829 m)    Weight:   Wt Readings from Last 1 Encounters:  05/21/20 65.8 kg    Ideal Body Weight:  80.9 kg  BMI:  Body mass index is 19.67 kg/m.  Estimated Nutritional Needs:   Kcal:  2100-2300  Protein:  115-130 grams  Fluid:  > 2 L    Levada Schilling, RD, LDN, CDCES Registered  Dietitian II Certified Diabetes Care and Education Specialist Please refer to [facility] for RD and/or RD on-call/weekend/after hours pager

## 2020-05-22 NOTE — Progress Notes (Signed)
Patient dizzy/lightheaded upon washing himself in front of the sink, helped by the staff back to the bed, he was hypotensive, bradycardic and altered, patient went into cardiac arrest, CPR was performed for 3 minutes, he received epi x1, he did receive TNKase given known acute PE, he sustained PEA through entire arrest, ROSC, actually he woke up after the code, , but extremely delirious and combative after the code, he became hypotensive, lethargic poor he was intubated by anesthesia, he is transferred to ICU, I have called daughter and updated her about these events. Huey Bienenstock MD

## 2020-05-22 NOTE — TOC Progression Note (Signed)
Transition of Care [facility]) - Progression Note    Patient Details  Name: Dwayne Barnes MRN: [id number] Date of Birth: 04/22/1951  Transition of Care [facility]) CM/SW Contact  Lockie Pares, RN Phone Number: 05/22/2020, 5:07 PM  Clinical Narrative:    Benefit check and eligibility sent for  DOAC. Can provide a 30 day free card for Eliquis. Still on Heparin drip. PT will evaluate in AM  May need HH or DME CM will follow for needs.    Expected Discharge Plan: Home w Home Health Services Barriers to Discharge: Continued Medical Work up  Expected Discharge Plan and Services Expected Discharge Plan: Home w Home Health Services   Discharge Planning Services: CM Consult   Living arrangements for the past 2 months: Single Family Home                                       Social Determinants of Health (SDOH) Interventions    Readmission Risk Interventions No flowsheet data found.

## 2020-05-22 NOTE — Progress Notes (Signed)
Code blue activated. Rapid response nurse and MD present at bedside. See code and MD documentations. Family Notified and made aware of room M3 room 10.

## 2020-05-22 NOTE — Procedures (Signed)
Central Venous Catheter Insertion Procedure Note  SAATVIK THIELMAN  [id number]  01-13-1951  Date:05/22/20  Time:7:29 PM   Provider Performing:Ashunti Schofield B Francine Graven   Procedure: Insertion of Non-tunneled Central Venous Catheter(36556) with US guidance (93790)   Indication(s) Medication administration and Difficult access  Consent Unable to obtain consent due to emergent nature of procedure.  Anesthesia Topical only with 1% lidocaine   Timeout Verified patient identification, verified procedure, site/side was marked, verified correct patient position, special equipment/implants available, medications/allergies/relevant history reviewed, required imaging and test results available.  Sterile Technique Maximal sterile technique including full sterile barrier drape, hand hygiene, sterile gown, sterile gloves, mask, hair covering, sterile ultrasound probe cover (if used).  Procedure Description Area of catheter insertion was cleaned with chlorhexidine and draped in sterile fashion.  With real-time ultrasound guidance a central venous catheter was placed into the left internal jugular vein. Nonpulsatile blood flow and easy flushing noted in all ports.  The catheter was sutured in place and sterile dressing applied.  Complications/Tolerance None; patient tolerated the procedure well. Chest X-ray is ordered to verify placement for internal jugular or subclavian cannulation.   Chest x-ray is not ordered for femoral cannulation.  EBL Minimal  Specimen(s) None

## 2020-05-22 NOTE — Consult Note (Signed)
NAME:  Dwayne Barnes, MRN:  [id number], DOB:  December 18, 1950, LOS: 1 ADMISSION DATE:  05/20/2020, CONSULTATION DATE:  05/22/20 REFERRING MD:  Elgergawy  CHIEF COMPLAINT:  Cardiac Arrest   Brief History   Dwayne Barnes is a 69 y.o. male who was admitted 11/18 with N/V, weakness.  Workup in ED was noteable for pyuria and positive COVID test. He later had CTA due to markedly elevated D-dimer which demonstrated large LLL PE.  He was started on heparin gtt along with steroids and remdesivir. Unfortunately late afternoon 11/19, he had a cardiac arrest.  He was intubated during the code.  Total downtime of 2-6 minutes before ROSC.  History of present illness   Pt is encephelopathic; therefore, this HPI is obtained from chart review. Dwayne Barnes is a 69 y.o. male who has no significant PMH.  He presented to [facility] ED 11/18 with N/V, weakness and was found to have generalized colitis as well as incidental finding of COVID positive and large LLL PE.  He was started on heparin as well as steroids and remdesivir.  Unfortunately late PM 11/19, he had cardiac arrest.  He required 2 rounds of CPR before ROSC with total downtime of 2-6 minutes.  He was intubated during the code and was then transferred to the ICU where PCCM assumed his care. Soon after arrival in the ICU he coded again where 4 rounds of CPR were provided with ROSC.   A left IJ central line was placed in sterile fashion and a left femoral a-line was placed in sterile fashion. He is on levophed and vasopressin. He is on fentanyl and versed for sedation.   Past Medical History  has Generalized weakness; COVID-19 virus infection; and Acute pulmonary embolism (HCC) on their problem list.  Significant Hospital Events   11/19 PEA cardiac arrest 11/19 Intubated  Consults:  PCCM  Procedures:  11/19 Left IJ CVL 11/19 Endotracheal Tube 11/19 Left fem a-line  Significant Diagnostic Tests:  CT renal stone 11/18 > non specific colitis, GGO's, left  nephrolithiasis without obstructive uropathy. CTA chest 11/18 > large PC in LLL, GGO's LLL.  Micro Data:  Urine 11/18 >  COVID 11/18 > positive. Flu 11/18 > neg.  Antimicrobials:  Ceftriaxone x 1 in ED 11/18.   Interim history/subjective:   Awaiting labs to return. Bedside US shows dilated and global akinesis of RV.   Objective:  Blood pressure 121/73, pulse 79, temperature 98.4 F (36.9 C), temperature source Oral, resp. rate 16, height 6' (1.829 m), weight 65.8 kg, SpO2 100 %.        Intake/Output Summary (Last 24 hours) at 05/22/2020 1717 Last data filed at 05/22/2020 4098 Gross per 24 hour  Intake 628.25 ml  Output 425 ml  Net 203.25 ml   Filed Weights   05/21/20 1007 05/21/20 1204  Weight: 65.8 kg 65.8 kg    Examination: General: intubated, moving all 4 extremities spontaneously Neuro: moving extremities, PERRL HEENT: sclera anicteric Cardiovascular: tachycardic Lungs: course breath sounds Abdomen: soft, non-distended GU: condom cath in place Skin: no lesions  Assessment & Plan:   Cardiac Arrest - presumed 2/2 PE, s/p tPA during code.  - Supportive care. - Echo ordered - vasopressor support with levophed and vasopressin. Add epinephrine for right heart failure.  Acute hypoxic respiratory failure - s/p intubation during code. - Full vent support. - Wean as able. - Bronchial hygiene. - Follow CXR. - sedation with versed and fentanyl  LLE PE - incidental finding on  admission.  Received tPA during code.  Of note, there is reportedly a family hx of Factor V Leiden Deficiency. - Continue heparin gtt. - Assess echo. - F/u on LE duplex. - Would maintain anticoagulation for life given family hx Factor V Leiden.  COVID PNA. - Continue steroids, remdesivir.  Colitis. - Supportive care.  Best Practice:  Diet: NPO. Pain/Anxiety/Delirium protocol (if indicated): PAD protocol ordered VAP protocol (if indicated): In place. DVT prophylaxis: Heparin  gtt. GI prophylaxis: PPI. Glucose control: SSI if glucose consistently > 180. Mobility: Bedrest. Code Status: Full. Family Communication: Spoke with daughter and sister in the chapel. They understand the gravity of the situation with high risk of not surviving this hospitalization. Disposition: ICU.  Labs   CBC: Recent Labs  Lab 05/20/20 1951 05/22/20 0239  WBC 10.9* 12.9*  NEUTROABS  --  11.6*  HGB 14.3 12.0*  HCT 45.5 36.6*  MCV 91.4 89.3  PLT 496* 395   Basic Metabolic Panel: Recent Labs  Lab 05/20/20 1951 05/22/20 0239  NA 139 138  K 3.6 4.3  CL 105 108  CO2 22 23  GLUCOSE 98 137*  BUN 30* 21  CREATININE 1.06 0.75  CALCIUM 9.2 8.8*  MG  --  2.1  PHOS  --  3.2   GFR: Estimated Creatinine Clearance: 82.3 mL/min (by C-G formula based on SCr of 0.75 mg/dL). Recent Labs  Lab 05/20/20 1951 05/21/20 0012 05/21/20 1211 05/22/20 0239  PROCALCITON  --   --  <0.10  --   WBC 10.9*  --   --  12.9*  LATICACIDVEN  --  1.1  --   --    Liver Function Tests: Recent Labs  Lab 05/20/20 1951 05/22/20 0239  AST 21 27  ALT 18 23  ALKPHOS 64 70  BILITOT 1.9* 0.7  PROT 7.3 5.9*  ALBUMIN 3.1* 2.5*   Recent Labs  Lab 05/20/20 1951  LIPASE 31   No results for input(s): AMMONIA in the last 168 hours. ABG    Component Value Date/Time   HCO3 33.2 (H) 12/15/2006 1956   TCO2 24 04/18/2010 1850    Coagulation Profile: No results for input(s): INR, PROTIME in the last 168 hours. Cardiac Enzymes: No results for input(s): CKTOTAL, CKMB, CKMBINDEX, TROPONINI in the last 168 hours. HbA1C: No results found for: HGBA1C CBG: No results for input(s): GLUCAP in the last 168 hours.  Review of Systems:   Unable to obtain as pt is encephalopathic.  Past medical history  He,  has no past medical history on file.   Surgical History   History reviewed. No pertinent surgical history.   Social History   reports that he has quit smoking. He has never used smokeless tobacco.  He reports that he does not drink alcohol and does not use drugs.   Family history   His family history includes Factor V Leiden deficiency in an other family member; Stroke (age of onset: 73) in his mother.   Allergies No Known Allergies   Home meds  Prior to Admission medications   Medication Sig Start Date End Date Taking? Authorizing Provider  guaiFENesin (MUCINEX) 600 MG 12 hr tablet Take 600 mg by mouth 2 (two) times daily as needed for cough or to loosen phlegm.   Yes [provider]  hydrocortisone cream 1 % Apply 1 application topically as needed (for bite).    Yes [provider]  ibuprofen (ADVIL,MOTRIN) 200 MG tablet Take 400 mg by mouth 2 (two) times daily  as needed for moderate pain.   Yes [provider]  Multiple Vitamins-Minerals (ALIVE MENS ENERGY) TABS Take 1 tablet by mouth daily.   Yes [provider]  Neomycin-Bacitracin-Polymyxin (NEOSPORIN ORIGINAL) [phone] OINT Apply 1 application topically as needed (for bite).   Yes [provider]  Omega-3 Fatty Acids (OMEGA 3 PO) Take 1 capsule by mouth daily.   Yes [provider]  OVER THE COUNTER MEDICATION Take 1 tablet by mouth daily. "ANDROZONE" TESTOSTERONE SUPPLEMENT   Yes [provider]    Critical care time: >90 minutes   Melody Comas, MD [facility] Office: [phone]   See Amion for Pager Details

## 2020-05-22 NOTE — Progress Notes (Signed)
ANTICOAGULATION CONSULT NOTE  Pharmacy Consult for Heparin Indication: pulmonary embolus   No Known Allergies  Patient Measurements: Height: 6' (182.9 cm) Weight: 65.8 kg (145 lb 1 oz) IBW/kg (Calculated) : 77.6  Vital Signs: Temp: 98.4 F (36.9 C) (11/19 1158) Temp Source: Oral (11/19 1158) BP: 105/88 (11/19 1700) Pulse Rate: 79 (11/19 1158)  Labs: Recent Labs    05/20/20 1951 05/20/20 1951 05/21/20 1952 05/21/20 2130 05/22/20 0239 05/22/20 1452 05/22/20 1815  HGB 14.3   < >  --   --  12.0*  --  10.4*  HCT 45.5  --   --   --  36.6*  --  32.9*  PLT 496*  --   --   --  395  --  423*  APTT  --   --   --   --   --   --  50*  LABPROT  --   --   --   --   --   --  20.4*  INR  --   --   --   --   --   --  1.8*  HEPARINUNFRC  --   --   --   --  >2.20* 0.11*  --   CREATININE 1.06  --   --   --  0.75  --   --   TROPONINIHS  --   --  8 7  --   --   --    < > = values in this interval not displayed.    Estimated Creatinine Clearance: 82.3 mL/min (by C-G formula based on SCr of 0.75 mg/dL).  Assessment: 69 y.o. male with PE for heparin  Patient coded earlier this evening - got TNKase aptt now 50 - hep was held during code  No bleeding per rn  Goal of Therapy:  Heparin level 0.3-0.7 units/ml - 0.3 to 0.5 through 11/20 1800 Monitor platelets by anticoagulation protocol: Yes   Plan:  Resume heparin 800 units/hr 0200 hep lvl - will check q6h for 24h  Elmer Sow, PharmD, BCPS, BCCCP Clinical Pharmacist [phone]  Please check AMION for all [facility] numbers  05/22/2020 7:21 PM

## 2020-05-22 NOTE — Progress Notes (Signed)
OT Cancellation Note  Patient Details Name: Dwayne Barnes MRN: [id number] DOB: [birth year]   Cancelled Treatment:    Reason Eval/Treat Not Completed: Patient not medically ready (acute PE, heparin started last night and not therapeutic yet.) Will check back as schedule permits to initiate OT POC.  Dalphine Handing, MSOT, OTR/L Acute Rehabilitation Services [facility] Office Number: [phone] Pager: [phone]  Dalphine Handing 05/22/2020, 11:11 AM

## 2020-05-22 NOTE — Procedures (Signed)
Arterial Catheter Insertion Procedure Note  Dwayne Barnes  [id number]  1951/01/06  Date:05/22/20  Time:7:31 PM    Provider Performing: Martina Sinner    Procedure: Insertion of Arterial Line (56314) with US guidance (97026)   Indication(s) Blood pressure monitoring and/or need for frequent ABGs  Consent Unable to obtain consent due to emergent nature of procedure.  Anesthesia None   Time Out Verified patient identification, verified procedure, site/side was marked, verified correct patient position, special equipment/implants available, medications/allergies/relevant history reviewed, required imaging and test results available.   Sterile Technique Maximal sterile technique including full sterile barrier drape, hand hygiene, sterile gown, sterile gloves, mask, hair covering, sterile ultrasound probe cover (if used).   Procedure Description Area of catheter insertion was cleaned with chlorhexidine and draped in sterile fashion. With real-time ultrasound guidance an arterial catheter was placed into the left femoral artery.  Appropriate arterial tracings confirmed on monitor.     Complications/Tolerance None; patient tolerated the procedure well.   EBL Minimal   Specimen(s) None

## 2020-05-22 NOTE — Progress Notes (Signed)
   05/22/20 1645  Clinical Encounter Type  Visited With Patient not available  Visit Type Code  Referral From Nurse  Consult/Referral To Chaplain  Chaplain responded to code. The patient's family was not present. Advised unit secretary to call spiritual care department if family/ patient needs our services. This note was prepared by Deneen Harts, M.Div..  For questions please contact by phone [phone].

## 2020-05-22 NOTE — Progress Notes (Signed)
eLink Physician-Brief Progress Note Patient Name: KHALON CANSLER DOB: Jul 18, 1950 MRN: [id number]   Date of Service  05/22/2020  HPI/Events of Note  CBG 289 mg %, patient needs sliding scale Insulin coverage.  eICU Interventions  Sliding scale Insulin coverage ordered.        Thomasene Lot Taiga Lupinacci 05/22/2020, 9:31 PM

## 2020-05-22 NOTE — Progress Notes (Signed)
ANTICOAGULATION CONSULT NOTE  Pharmacy Consult for Heparin Indication: pulmonary embolus   No Known Allergies  Patient Measurements: Height: 6' (182.9 cm) Weight: 65.8 kg (145 lb 1 oz) IBW/kg (Calculated) : 77.6  Vital Signs: Temp: 97.8 F (36.6 C) (11/19 0454) Temp Source: Axillary (11/19 0454) BP: 104/67 (11/19 0454) Pulse Rate: 64 (11/19 0454)  Labs: Recent Labs    05/20/20 1951 05/21/20 1952 05/21/20 2130 05/22/20 0239  HGB 14.3  --   --  12.0*  HCT 45.5  --   --  36.6*  PLT 496*  --   --  395  HEPARINUNFRC  --   --   --  >2.20*  CREATININE 1.06  --   --  0.75  TROPONINIHS  --  8 7  --     Estimated Creatinine Clearance: 82.3 mL/min (by C-G formula based on SCr of 0.75 mg/dL).  Assessment: 68 y.o. male with PE for heparin Goal of Therapy:  Heparin level 0.3-0.7 units/ml Monitor platelets by anticoagulation protocol: Yes   Plan:  Hold heparin x 1.5 hours, then decrease heparin 900 units/hr Check heparin level in 8 hours.  Geannie Risen, PharmD, BCPS  05/22/2020,5:20 AM

## 2020-05-22 NOTE — Significant Event (Signed)
Rapid Response Event Note   Reason for Call :  Blank stare, non responding to staff  Initial Focused Assessment:  Pt was up to the chair to clean his self up at the sink. NT stepped away to grab additional linen and when he returned the pt was still sitting in the chair, stiff with a blank stare. Pt intermittently moving extremities. Pulse present. Pt lifted back to bed. Initial BP 82/61, HR 121, RR 32, SpO2 93%, CBG 202. Pt pale, cool and clammy. 5LNC applied. Still not responding to staff but he is lifting his arms overhead and bending his knees. 1L IVF bolus started. BP dropped to 45/21, HR 105 and teetering down to the 80s. Pt began to posture, cyanotic to face and neck. No pulse palpable, PEA. Code blue initiated.   Interventions:  -1L IVF bolus -Code blue  Plan of Care:  Transfer to ICU  Event Summary:  MD Notified: Dr. Randol Kern Call Time: 1619 Arrival Time: 1622 End Time: 1750  Jennye Moccasin, RN

## 2020-05-22 NOTE — Progress Notes (Signed)
CRITICAL VALUE ALERT  Critical Value:  Lactic 9.6 /Troponin 364  Date & Time Notied:  05/22/20 1938  Provider Notified: Dr. Francine Graven  Orders Received/Actions taken: No new orders at this time

## 2020-05-22 NOTE — TOC Benefit Eligibility Note (Signed)
Transition of Care [facility]) Benefit Eligibility Note    Patient Details  Name: Dwayne Barnes MRN: [id number] Date of Birth: 1950-07-12   Medication/Dose: ELIQUIS   5 MG BID    and    XARELTO  10 MG DAILY  Covered?: Yes  Tier:  (TIER- 4 DRUG)  Prescription Coverage Preferred Pharmacy: [facility]  and   [facility]  Spoke with Person/Company/Phone Number:: [facility]  @ [place] [facility] # [phone]  Co-Pay: $ 45.00   FOR EACH PRESCRIPTION     Deductible: Unmet       Mardene Sayer Phone Number: 05/22/2020, 4:55 PM

## 2020-05-22 NOTE — Progress Notes (Addendum)
PROGRESS NOTE                                                                             PROGRESS NOTE                                                                                                                                                                                                             Patient Demographics:    Dwayne Barnes, is a 69 y.o. male, DOB - March 26, 1951, ZOX:[id number]  Outpatient Primary MD for the patient is [name], PA-C    LOS - 1  Admit date - 05/20/2020    Chief Complaint  Patient presents with  . Fatigue       Brief Narrative   HPI: Dwayne Barnes is a 69 y.o. male without known medical history presenting with n/v, weakness, and back pain.  He reports onset of LBP last week with n/v developing over the week and poor PO intake.   He does not usually need a cane or walker but has been too weak to stand.  He had URI symptoms last week.      ED Course:  Carryover, per Dr. Margo Aye:  Patient presents to the emergency department with complaints of back pain and generalized weakness x 5 days. Associated with nausea and vomiting, has not been able to eat anything. Work up in the ED revealed +UA for pyuria and +COVID-19 screen test.    Subjective:    Dwayne Barnes today, fever, chills or shortness of breath .   Assessment  & Plan :    Principal Problem:   COVID-19 virus infection Active Problems:   Generalized weakness   Acute pulmonary embolism (HCC)  COVID-19 Infection -Fortunately he is vaccinated with J&J on 09/13/2019 -CT chest showing diffuse multifocal opacities due to COVID-19 of pneumonia, for now continue with IV remdesivir, and IV steroids.  PE -CTA chest with large occlusive pulmonary embolism within the proximal left lower lobe pulmonary artery and lingular pulmonary artery. -This is most likely provoked in  the setting of COVID-19 infection. -Patient/daughter and clear about familial  history of clotting disorder. -Given it is occlusive PE will continue with heparin GTT for 48 hours then transition to Eliquis -We will check venous Doppler to rule out DVT  Symptomatic bacteriuria -We will monitor off antibiotics  There is questionable colitis on imaging, patient denies any symptoms, abdominal exam is benign, will continue watching off antibiotics  Of note there is no confirmed family history of factor V Leyden deficiency as I confirmed with the daughter.  She does report family history of MEN 1  SpO2: 100 % O2 Flow Rate (L/min): 0 L/min FiO2 (%): (!) 0 %  Recent Labs  Lab 05/20/20 1951 05/21/20 0012 05/21/20 0123 05/21/20 1211 05/21/20 1952 05/22/20 0239  WBC 10.9*  --   --   --   --  12.9*  PLT 496*  --   --   --   --  395  CRP  --   --   --  9.6*  --  13.3*  BNP  --   --   --   --  73.5  --   DDIMER  --   --   --  >20.00*  --  18.77*  PROCALCITON  --   --   --  <0.10  --   --   AST 21  --   --   --   --  27  ALT 18  --   --   --   --  23  ALKPHOS 64  --   --   --   --  70  BILITOT 1.9*  --   --   --   --  0.7  ALBUMIN 3.1*  --   --   --   --  2.5*  LATICACIDVEN  --  1.1  --   --   --   --   SARSCOV2NAA  --   --  POSITIVE*  --   --   --        ABG     Component Value Date/Time   HCO3 33.2 (H) 12/15/2006 1956   TCO2 24 04/18/2010 1850         Condition - Extremely Guarded  Family Communication  :  D/W daughter  Code Status :  Full  Consults  :  none  Procedures  :  none  PUD Prophylaxis : protonix  Disposition Plan  :    Status is: Inpatient  Remains inpatient appropriate because:IV treatments appropriate due to intensity of illness or inability to take PO   Dispo: The patient is from: Home              Anticipated d/c is to: Home              Anticipated d/c date is: 2 days              Patient currently is not medically stable to d/c.      DVT Prophylaxis  :  Heparin GTT Lab Results  Component Value Date   PLT 395  05/22/2020    Diet :  Diet Order            Diet regular Room service appropriate? Yes; Fluid consistency: Thin  Diet effective now                  Inpatient Medications  Scheduled Meds: . feeding supplement  237 mL Oral BID BM  . methylPREDNISolone (  SOLU-MEDROL) injection  0.5 mg/kg Intravenous Q12H   Followed by  . [START ON 05/24/2020] predniSONE  50 mg Oral Daily  . multivitamin with minerals  1 tablet Oral Daily  . sodium chloride flush  3 mL Intravenous Q12H   Continuous Infusions: . sodium chloride    . heparin 900 Units/hr (05/22/20 0703)  . remdesivir 200 mg in sodium chloride 0.9% 250 mL IVPB Stopped (05/21/20 1130)   Followed by  . remdesivir 100 mg in NS 100 mL 100 mg (05/22/20 0852)   PRN Meds:.sodium chloride, acetaminophen, albuterol, bisacodyl, chlorpheniramine-HYDROcodone, guaiFENesin-dextromethorphan, ondansetron **OR** ondansetron (ZOFRAN) IV, oxyCODONE, polyethylene glycol, sodium chloride flush, sodium phosphate  Antibiotics  :    Anti-infectives (From admission, onward)   Start     Dose/Rate Route Frequency Ordered Stop   05/22/20 1000  remdesivir 100 mg in sodium chloride 0.9 % 100 mL IVPB       "Followed by" Linked Group Details   100 mg 200 mL/hr over 30 Minutes Intravenous Daily 05/21/20 0857 05/26/20 0959   05/21/20 1030  remdesivir 200 mg in sodium chloride 0.9% 250 mL IVPB       "Followed by" Linked Group Details   200 mg 580 mL/hr over 30 Minutes Intravenous Once 05/21/20 0857     05/21/20 0330  metroNIDAZOLE (FLAGYL) tablet 500 mg        500 mg Oral  Once 05/21/20 0320 05/21/20 0411   05/20/20 2330  cefTRIAXone (ROCEPHIN) 2 g in sodium chloride 0.9 % 100 mL IVPB        2 g 200 mL/hr over 30 Minutes Intravenous  Once 05/20/20 2324 05/21/20 0331       Mliss Fritz Alexsander Cavins M.D on 05/22/2020 at 2:34 PM  To page go to [email or web address]   [facility] -  Office  [phone]     Objective:   Vitals:   05/21/20 2346 05/22/20 0454  05/22/20 0752 05/22/20 1158  BP: 106/70 104/67 135/72 121/73  Pulse: 68 64 65 79  Resp: 15 14 10 16   Temp: 97.6 F (36.4 C) 97.8 F (36.6 C) 97.9 F (36.6 C) 98.4 F (36.9 C)  TempSrc: Axillary Axillary Oral Oral  SpO2: 99% 100% 100% 100%  Weight:      Height:        Wt Readings from Last 3 Encounters:  05/21/20 65.8 kg  12/20/14 66 kg     Intake/Output Summary (Last 24 hours) at 05/22/2020 1434 Last data filed at 05/22/2020 0909 Gross per 24 hour  Intake 918.25 ml  Output 425 ml  Net 493.25 ml     Physical Exam  Awake Alert, No new F.N deficits, Normal affect Extremely hard of hearing Symmetrical Chest wall movement, Good air movement bilaterally, CTAB RRR,No Gallops,Rubs or new Murmurs, No Parasternal Heave +ve B.Sounds, Abd Soft, No tenderness, No rebound - guarding or rigidity. No Cyanosis, Clubbing or edema, No new Rash or bruise      Data Review:    CBC Recent Labs  Lab 05/20/20 1951 05/22/20 0239  WBC 10.9* 12.9*  HGB 14.3 12.0*  HCT 45.5 36.6*  PLT 496* 395  MCV 91.4 89.3  MCH 28.7 29.3  MCHC 31.4 32.8  RDW 13.2 13.2  LYMPHSABS  --  0.9  MONOABS  --  0.2  EOSABS  --  0.0  BASOSABS  --  0.0    Recent Labs  Lab 05/20/20 1951 05/21/20 0012 05/21/20 1211 05/21/20 1952 05/22/20 0239  NA 139  --   --   --  138  K 3.6  --   --   --  4.3  CL 105  --   --   --  108  CO2 22  --   --   --  23  GLUCOSE 98  --   --   --  137*  BUN 30*  --   --   --  21  CREATININE 1.06  --   --   --  0.75  CALCIUM 9.2  --   --   --  8.8*  AST 21  --   --   --  27  ALT 18  --   --   --  23  ALKPHOS 64  --   --   --  70  BILITOT 1.9*  --   --   --  0.7  ALBUMIN 3.1*  --   --   --  2.5*  MG  --   --   --   --  2.1  CRP  --   --  9.6*  --  13.3*  DDIMER  --   --  >20.00*  --  18.77*  PROCALCITON  --   --  <0.10  --   --   LATICACIDVEN  --  1.1  --   --   --   BNP  --   --   --  73.5  --      ------------------------------------------------------------------------------------------------------------------ No results for input(s): CHOL, HDL, LDLCALC, TRIG, CHOLHDL, LDLDIRECT in the last 72 hours.  No results found for: HGBA1C ------------------------------------------------------------------------------------------------------------------ No results for input(s): TSH, T4TOTAL, T3FREE, THYROIDAB in the last 72 hours.  Invalid input(s): FREET3  Cardiac Enzymes No results for input(s): CKMB, TROPONINI, MYOGLOBIN in the last 168 hours.  Invalid input(s): CK ------------------------------------------------------------------------------------------------------------------    Component Value Date/Time   BNP 73.5 05/21/2020 1952    Micro Results Recent Results (from the past 240 hour(s))  Respiratory Panel by RT PCR (Flu A&B, Covid) - Nasopharyngeal Swab     Status: Abnormal   Collection Time: 05/21/20  1:23 AM   Specimen: Nasopharyngeal Swab  Result Value Ref Range Status   SARS Coronavirus 2 by RT PCR POSITIVE (A) NEGATIVE Final    Comment: RESULT CALLED TO, READ BACK BY AND VERIFIED WITH: A. COSTALES,RN 0252 05/21/2020 T. TYSOR (NOTE) SARS-CoV-2 target nucleic acids are DETECTED.  SARS-CoV-2 RNA is generally detectable in upper respiratory specimens  during the acute phase of infection. Positive results are indicative of the presence of the identified virus, but do not rule out bacterial infection or co-infection with other pathogens not detected by the test. Clinical correlation with patient history and other diagnostic information is necessary to determine patient infection status. The expected result is Negative.  Fact Sheet for Patients:  https://www.moore.com/  Fact Sheet for Healthcare Providers: https://www.young.biz/  This test is not yet approved or cleared by the [place] FDA and  has been authorized for  detection and/or diagnosis of SARS-CoV-2 by FDA under an Emergency Use Authorization (EUA).  This EUA will remain in effect (meaning this test ca n be used) for the duration of  the COVID-19 declaration under Section 564(b)(1) of the Act, 21 U.S.C. section 360bbb-3(b)(1), unless the authorization is terminated or revoked sooner.      Influenza A by PCR NEGATIVE NEGATIVE Final   Influenza B by PCR NEGATIVE NEGATIVE Final    Comment: (NOTE) The Xpert Xpress SARS-CoV-2/FLU/RSV assay is intended as an aid in  the diagnosis  of influenza from Nasopharyngeal swab specimens and  should not be used as a sole basis for treatment. Nasal washings and  aspirates are unacceptable for Xpert Xpress SARS-CoV-2/FLU/RSV  testing.  Fact Sheet for Patients: https://www.moore.com/  Fact Sheet for Healthcare Providers: https://www.young.biz/  This test is not yet approved or cleared by the [place] FDA and  has been authorized for detection and/or diagnosis of SARS-CoV-2 by  FDA under an Emergency Use Authorization (EUA). This EUA will remain  in effect (meaning this test can be used) for the duration of the  Covid-19 declaration under Section 564(b)(1) of the Act, 21  U.S.C. section 360bbb-3(b)(1), unless the authorization is  terminated or revoked. Performed at [facility], [address], [place], [place] [address]   Urine Culture     Status: Abnormal   Collection Time: 05/21/20  2:54 AM   Specimen: Urine, Random  Result Value Ref Range Status   Specimen Description URINE, RANDOM  Final   Special Requests NONE  Final   Culture (A)  Final    <10,000 COLONIES/mL INSIGNIFICANT GROWTH Performed at [facility], [address] [place], [place] [address]    Report Status 05/22/2020 FINAL  Final    Radiology Reports CT ANGIO CHEST PE W OR WO CONTRAST  Addendum Date: 05/21/2020   ADDENDUM REPORT: 05/21/2020 19:28 ADDENDUM: Critical  Value/emergent results were called by telephone at the time of interpretation on 05/21/2020 at 7:27 pm to provider Dr. Dierdre Highman, who verbally acknowledged these results. Of note the airspace disease in the LEFT lower lobe is progressed from CT same day. Differential remains the same. Electronically Signed   By: Genevive Bi M.D.   On: 05/21/2020 19:28   Result Date: 05/21/2020 CLINICAL DATA:  PE suspected, high probability.  COVID-19 infection EXAM: CT ANGIOGRAPHY CHEST WITH CONTRAST TECHNIQUE: Multidetector CT imaging of the chest was performed using the standard protocol during bolus administration of intravenous contrast. Multiplanar CT image reconstructions and MIPs were obtained to evaluate the vascular anatomy. CONTRAST:  98mL OMNIPAQUE IOHEXOL 300 MG/ML  SOLN COMPARISON:  None. FINDINGS: Cardiovascular: Large filling defect within the proximal LEFT lower lobe pulmonary artery. Filling defect spans the bifurcation of the LEFT lower lobe and lingular branch pulmonary branches. This this filling defect/thrombus is occlusive to the LEFT lower lobe. No LEFT upper lobe filling defects identified. No filling defect identified within the RIGHT lung pulmonary arteries. No evidence of RIGHT ventricular strain with the RV to LV ratio less than 1. Coronary artery calcification and aortic atherosclerotic calcification. Mediastinum/Nodes: No axillary or supraclavicular adenopathy. No mediastinal or hilar adenopathy. No pericardial fluid. Esophagus normal. Lungs/Pleura: There is diffuse very fine airspace disease within the LEFT lower lobe involving the majority of the LEFT lower lobe. Similar milder findings in the posterior RIGHT lower lobe and lingula. Upper Abdomen: Limited view of the liver, kidneys, pancreas are unremarkable. Normal adrenal glands. Musculoskeletal: No aggressive osseous lesion. Review of the MIP images confirms the above findings. IMPRESSION: 1. Large occlusive pulmonary embolism within the  proximal LEFT lower lobe pulmonary artery and lingular pulmonary artery. 2. Diffuse fine airspace disease within the LEFT lower lobe with differential including pulmonary infarction, pulmonary hemorrhage, or pneumonia (including COVID pneumonia). 3. No evidence of RIGHT ventricular strain. 4. Coronary artery calcification and aortic atherosclerotic calcification. Critical Value/emergent results were called by telephone at the time of interpretation on 05/21/2020 at 6:55 pm to provider [facility] , who verbally acknowledged these results. Electronically Signed: By:  Genevive BiStewart  Edmunds M.D. On: 05/21/2020 18:59   DG Chest Port 1 View  Result Date: 05/21/2020 CLINICAL DATA:  Cough, lower back pain, has not eaten 5 days, emesis EXAM: PORTABLE CHEST 1 VIEW COMPARISON:  Radiograph 04/19/2010, CT 04/18/2010 FINDINGS: Heterogeneous opacities present predominantly along the periphery of the left lung and minimally in the right lung base as well. No pneumothorax or effusion. The aorta is calcified. The remaining cardiomediastinal contours are unremarkable. No acute osseous or soft tissue abnormality. Degenerative changes are present in the imaged spine and shoulders. Telemetry leads overlie the chest. IMPRESSION: Heterogeneous opacities throughout the lungs, greatest in the left lung periphery concerning for pneumonia including potential viral etiology. Aortic Atherosclerosis (ICD10-I70.0). Electronically Signed   By: Kreg ShropshirePrice  DeHay M.D.   On: 05/21/2020 03:50   CT RENAL STONE STUDY  Result Date: 05/21/2020 CLINICAL DATA:  69 year old male with low back pain, flank pain, decreased p.o. EXAM: CT ABDOMEN AND PELVIS WITHOUT CONTRAST TECHNIQUE: Multidetector CT imaging of the abdomen and pelvis was performed following the standard protocol without IV contrast. COMPARISON:  Noncontrast CT Abdomen and Pelvis 10/28/2008. Portable chest radiograph 04/19/2010. FINDINGS: Lower chest: Patchy, irregular peribronchial and  peripheral scattered pulmonary opacity at both lung bases. This seems to be new from last month. Underlying probable pulmonary hyperinflation. Some associated lung base bronchiectasis. No cavitating areas identified. No cardiomegaly, pericardial effusion or pleural effusion. Hepatobiliary: Negative noncontrast liver and gallbladder. Pancreas: Negative. Spleen: Negative. Adrenals/Urinary Tract: Bulky left nephrolithiasis measuring 13 mm at the renal pelvis. Additional left lower pole stone. No hydronephrosis. Negative noncontrast right kidney. No hydroureter. Unremarkable urinary bladder. Incidental pelvic phleboliths. Stomach/Bowel: Decompressed large bowel. There is long segment circumferential wall thickening in the right colon, up to the 12 mm series 4, image 49) with some areas of adjacent mesenteric stranding (coronal image 43). The wall thickening gradually decreases through the transverse colon. No free air. No free fluid. The terminal ileum appear spared. Appendix seems to remain normal on coronal image 32. No dilated small bowel.  Decompressed stomach and duodenum. Vascular/Lymphatic: Normal caliber abdominal aorta. Mild calcified atherosclerosis. Vascular patency is not evaluated in the absence of IV contrast. No lymphadenopathy identified in the absence of contrast. Reproductive: Negative. Other: Previous lower abdominal ventral hernia repair with mesh. No pelvic free fluid. Musculoskeletal: Degenerative changes in the spine. No acute osseous abnormality identified. IMPRESSION: 1. In the abdomen there is circumferential bowel wall thickening and mesenteric stranding involving the right colon. Wall thickening gradually abates through the transverse colon. This is compatible with Acute Nonspecific Colitis. 2. But the lung bases are also abnormal with patchy and irregular peribronchial and peripheral pulmonary opacity suspicious for acute viral/atypical respiratory infection. No areas of cavitation to  suggest septic emboli. No pleural effusion. 3. Bulky left nephrolithiasis, but no obstructive uropathy. 4. Previous lower abdominal hernia repair with mesh. Electronically Signed   By: Odessa FlemingH  Hall M.D.   On: 05/21/2020 02:46

## 2020-05-23 ENCOUNTER — Inpatient Hospital Stay (HOSPITAL_COMMUNITY): Payer: Medicare HMO

## 2020-05-23 DIAGNOSIS — I2602 Saddle embolus of pulmonary artery with acute cor pulmonale: Secondary | ICD-10-CM

## 2020-05-23 DIAGNOSIS — U071 COVID-19: Secondary | ICD-10-CM | POA: Diagnosis not present

## 2020-05-23 LAB — ECHOCARDIOGRAM COMPLETE
Area-P 1/2: 3.27 cm2
Calc EF: 53.5 %
Height: 72 in
S' Lateral: 2.8 cm
Single Plane A2C EF: 50.9 %
Single Plane A4C EF: 54.1 %
Weight: 2278.67 oz

## 2020-05-23 LAB — CBC WITH DIFFERENTIAL/PLATELET
Abs Immature Granulocytes: 1.08 10*3/uL — ABNORMAL HIGH (ref 0.00–0.07)
Basophils Absolute: 0.1 10*3/uL (ref 0.0–0.1)
Basophils Relative: 1 %
Eosinophils Absolute: 0 10*3/uL (ref 0.0–0.5)
Eosinophils Relative: 0 %
HCT: 35.7 % — ABNORMAL LOW (ref 39.0–52.0)
Hemoglobin: 11.8 g/dL — ABNORMAL LOW (ref 13.0–17.0)
Immature Granulocytes: 4 %
Lymphocytes Relative: 3 %
Lymphs Abs: 1 10*3/uL (ref 0.7–4.0)
MCH: 29.9 pg (ref 26.0–34.0)
MCHC: 33.1 g/dL (ref 30.0–36.0)
MCV: 90.4 fL (ref 80.0–100.0)
Monocytes Absolute: 1.8 10*3/uL — ABNORMAL HIGH (ref 0.1–1.0)
Monocytes Relative: 6 %
Neutro Abs: 25.8 10*3/uL — ABNORMAL HIGH (ref 1.7–7.7)
Neutrophils Relative %: 86 %
Platelets: 436 10*3/uL — ABNORMAL HIGH (ref 150–400)
RBC: 3.95 MIL/uL — ABNORMAL LOW (ref 4.22–5.81)
RDW: 13.5 % (ref 11.5–15.5)
WBC: 29.8 10*3/uL — ABNORMAL HIGH (ref 4.0–10.5)
nRBC: 0 % (ref 0.0–0.2)

## 2020-05-23 LAB — BRAIN NATRIURETIC PEPTIDE: B Natriuretic Peptide: 499.2 pg/mL — ABNORMAL HIGH (ref 0.0–100.0)

## 2020-05-23 LAB — COMPREHENSIVE METABOLIC PANEL
ALT: 140 U/L — ABNORMAL HIGH (ref 0–44)
AST: 130 U/L — ABNORMAL HIGH (ref 15–41)
Albumin: 2.4 g/dL — ABNORMAL LOW (ref 3.5–5.0)
Alkaline Phosphatase: 192 U/L — ABNORMAL HIGH (ref 38–126)
Anion gap: 14 (ref 5–15)
BUN: 37 mg/dL — ABNORMAL HIGH (ref 8–23)
CO2: 20 mmol/L — ABNORMAL LOW (ref 22–32)
Calcium: 8.4 mg/dL — ABNORMAL LOW (ref 8.9–10.3)
Chloride: 105 mmol/L (ref 98–111)
Creatinine, Ser: 2.15 mg/dL — ABNORMAL HIGH (ref 0.61–1.24)
GFR, Estimated: 33 mL/min — ABNORMAL LOW (ref >60)
Glucose, Bld: 351 mg/dL — ABNORMAL HIGH (ref 70–99)
Potassium: 3.5 mmol/L (ref 3.5–5.1)
Sodium: 139 mmol/L (ref 135–145)
Total Bilirubin: 0.7 mg/dL (ref 0.3–1.2)
Total Protein: 5.8 g/dL — ABNORMAL LOW (ref 6.5–8.1)

## 2020-05-23 LAB — BASIC METABOLIC PANEL
Anion gap: 13 (ref 5–15)
BUN: 43 mg/dL — ABNORMAL HIGH (ref 8–23)
CO2: 23 mmol/L (ref 22–32)
Calcium: 8.3 mg/dL — ABNORMAL LOW (ref 8.9–10.3)
Chloride: 105 mmol/L (ref 98–111)
Creatinine, Ser: 2.08 mg/dL — ABNORMAL HIGH (ref 0.61–1.24)
GFR, Estimated: 34 mL/min — ABNORMAL LOW (ref >60)
Glucose, Bld: 160 mg/dL — ABNORMAL HIGH (ref 70–99)
Potassium: 4.2 mmol/L (ref 3.5–5.1)
Sodium: 141 mmol/L (ref 135–145)

## 2020-05-23 LAB — HEPARIN LEVEL (UNFRACTIONATED)
Heparin Unfractionated: 0.25 IU/mL — ABNORMAL LOW (ref 0.30–0.70)
Heparin Unfractionated: 0.35 IU/mL (ref 0.30–0.70)
Heparin Unfractionated: 0.28 IU/mL — ABNORMAL LOW (ref 0.30–0.70)

## 2020-05-23 LAB — PHOSPHORUS: Phosphorus: 6.2 mg/dL — ABNORMAL HIGH (ref 2.5–4.6)

## 2020-05-23 LAB — GLUCOSE, CAPILLARY
Glucose-Capillary: 115 mg/dL — ABNORMAL HIGH (ref 70–99)
Glucose-Capillary: 122 mg/dL — ABNORMAL HIGH (ref 70–99)
Glucose-Capillary: 142 mg/dL — ABNORMAL HIGH (ref 70–99)
Glucose-Capillary: 153 mg/dL — ABNORMAL HIGH (ref 70–99)
Glucose-Capillary: 264 mg/dL — ABNORMAL HIGH (ref 70–99)
Glucose-Capillary: 269 mg/dL — ABNORMAL HIGH (ref 70–99)

## 2020-05-23 LAB — COOXEMETRY PANEL
Carboxyhemoglobin: 0.5 % (ref 0.5–1.5)
Methemoglobin: 0.9 % (ref 0.0–1.5)
O2 Saturation: 70.7 %
Total hemoglobin: 11.2 g/dL — ABNORMAL LOW (ref 12.0–16.0)

## 2020-05-23 LAB — TYPE AND SCREEN
ABO/RH(D): A POS
Antibody Screen: NEGATIVE

## 2020-05-23 LAB — FERRITIN: Ferritin: 1041 ng/mL — ABNORMAL HIGH (ref 24–336)

## 2020-05-23 LAB — MAGNESIUM: Magnesium: 2.4 mg/dL (ref 1.7–2.4)

## 2020-05-23 LAB — D-DIMER, QUANTITATIVE: D-Dimer, Quant: >20 ug/mL-FEU — ABNORMAL HIGH (ref 0.00–0.50)

## 2020-05-23 LAB — TROPONIN I (HIGH SENSITIVITY): Troponin I (High Sensitivity): 1792 ng/L (ref <18)

## 2020-05-23 LAB — C-REACTIVE PROTEIN: CRP: 14.6 mg/dL — ABNORMAL HIGH (ref <1.0)

## 2020-05-23 MED ORDER — ORAL CARE MOUTH RINSE
15.0000 mL | OROMUCOSAL | Status: DC
  Administered 2020-05-23 – 2020-05-26 (×22): 15 mL via OROMUCOSAL

## 2020-05-23 MED ORDER — LACTATED RINGERS IV BOLUS
500.0000 mL | Freq: Once | INTRAVENOUS | Status: AC
  Administered 2020-05-23: 500 mL via INTRAVENOUS

## 2020-05-23 MED ORDER — CHLORHEXIDINE GLUCONATE 0.12% ORAL RINSE (MEDLINE KIT)
15.0000 mL | Freq: Two times a day (BID) | OROMUCOSAL | Status: DC
  Administered 2020-05-23 – 2020-05-25 (×5): 15 mL via OROMUCOSAL

## 2020-05-23 MED FILL — Medication: Qty: 1 | Status: AC

## 2020-05-23 NOTE — Progress Notes (Signed)
PT Cancellation Note  Patient Details Name: Dwayne Barnes MRN: [id number] DOB: 1951/04/25   Cancelled Treatment:    Reason Eval/Treat Not Completed: (P) Medical issues which prohibited therapy Pt heparin levels continue to be subtherapeutic. PT will follow back for appropriateness of treatment after blood work this afternoon as able.   Gabreil [place] B. Beverely Risen PT, DPT Acute Rehabilitation Services Pager [phone] Office [phone]    Elon Alas Fleet 05/23/2020, 8:02 AM

## 2020-05-23 NOTE — Progress Notes (Signed)
ANTICOAGULATION CONSULT NOTE  Pharmacy Consult for Heparin Indication: pulmonary embolus   No Known Allergies  Patient Measurements: Height: 6' (182.9 cm) Weight: 64.6 kg (142 lb 6.7 oz) IBW/kg (Calculated) : 77.6  Vital Signs: Temp: 99.1 F (37.3 C) (11/20 1630) Temp Source: Oral (11/20 1630) BP: 109/73 (11/20 1830) Pulse Rate: 80 (11/20 1830)  Labs: Recent Labs    05/22/20 0239 05/22/20 1452 05/22/20 1815 05/22/20 1815 05/22/20 1948 05/22/20 2236 05/23/20 0417 05/23/20 0418 05/23/20 1244 05/23/20 1835  HGB 12.0*  --  10.4*   < > 11.2*  --   --  11.8*  --   --   HCT 36.6*  --  32.9*  --  33.0*  --   --  35.7*  --   --   PLT 395  --  423*  --   --   --   --  436*  --   --   APTT  --   --  50*  --   --   --   --   --   --   --   LABPROT  --   --  20.4*  --   --   --   --   --   --   --   INR  --   --  1.8*  --   --   --   --   --   --   --   HEPARINUNFRC >2.20*   < >  --   --   --   --  0.25*  --  0.35 0.28*  CREATININE 0.75  --  1.73*   < >  --  1.73*  --  2.15* 2.08*  --   TROPONINIHS  --   --  364*  --   --  2,480*  --  1,792*  --   --    < > = values in this interval not displayed.    Estimated Creatinine Clearance: 31.1 mL/min (A) (by C-G formula based on SCr of 2.08 mg/dL (H)).  Assessment: 69 y.o. male admitted on 05/21/2020 with N/V/weakness. Found to have PE after CTA obtained due to elevated d-dimer. Patient went into PEA arrest and received tenecteplase due to known PE at 1827 on 05/22/2020.   Pharmacy consulted to dose heparin. Heparin level now subtherapeutic at 0.28, will increase to full dosing as it has been >24h since TNKase.    Goal of Therapy:  Heparin level 0.3-0.5 units/mL through 1830 on 11/20 then increase goal range to 0.3 -0.7 units/mL Monitor platelets by anticoagulation protocol: Yes   Plan:  Increase heparin to 1050 units/h Recheck heparin level in 6h   Fredonia Highland, PharmD, BCPS Clinical Pharmacist [phone] Please check  AMION for all [facility] Pharmacy numbers 05/23/2020

## 2020-05-23 NOTE — Progress Notes (Addendum)
NAME:  Dwayne Barnes, MRN:  [id number], DOB:  04/22/1951, LOS: 2 ADMISSION DATE:  05/20/2020, CONSULTATION DATE:  05/22/20 REFERRING MD:  Elgergawy  CHIEF COMPLAINT:  Cardiac Arrest   Brief History   Dwayne Barnes is a 69 y.o. male who was admitted 11/18 with N/V, weakness.  Workup in ED was noteable for pyuria and positive COVID test. He later had CTA due to markedly elevated D-dimer which demonstrated large LLL PE.  He was started on heparin gtt along with steroids and remdesivir. Unfortunately late afternoon 11/19, he had a cardiac arrest.  He was intubated during the code.  Total downtime of 2-6 minutes before ROSC.  History of present illness   Pt is encephelopathic; therefore, this HPI is obtained from chart review. Dwayne Barnes is a 69 y.o. male who has no significant PMH.  He presented to [facility] [place] ED 11/18 with N/V, weakness and was found to have generalized colitis as well as incidental finding of COVID positive and large LLL PE.  He was started on heparin as well as steroids and remdesivir.  Unfortunately late PM 11/19, he had cardiac arrest.  He required 2 rounds of CPR before ROSC with total downtime of 2-6 minutes.  He was intubated during the code and was then transferred to the ICU where PCCM assumed his care. Soon after arrival in the ICU he coded again where 4 rounds of CPR were provided with ROSC.   A left IJ central line was placed in sterile fashion and a left femoral a-line was placed in sterile fashion. He is on levophed and vasopressin. He is on fentanyl and versed for sedation.   Past Medical History  has Generalized weakness; COVID-19 virus infection; and Acute pulmonary embolism (HCC) on their problem list.  Significant Hospital Events   11/19 PEA cardiac arrest 11/19 Intubated  Consults:  PCCM  Procedures:  11/19 Left IJ CVL 11/19 Endotracheal Tube 11/19 Left fem a-line  Significant Diagnostic Tests:  CT renal stone 11/18 > non specific colitis, GGO's, left  nephrolithiasis without obstructive uropathy. CTA chest 11/18 > large PC in LLL, GGO's LLL.  Micro Data:  Urine 11/18 >  COVID 11/18 > positive. Flu 11/18 > neg.  Antimicrobials:  Ceftriaxone x 1 in ED 11/18.   Interim history/subjective:   S/p admission yesterday. On minimal vent settings. Remains on high sedation  Objective:  Blood pressure 114/66, pulse 94, temperature 97.6 F (36.4 C), temperature source Axillary, resp. rate 16, height 6' (1.829 m), weight 64.6 kg, SpO2 100 %.    Vent Mode: PRVC FiO2 (%):  [40 %-100 %] 40 % Set Rate:  [16 bmp] 16 bmp Vt Set:  [620 mL] 620 mL PEEP:  [8 cmH20-16 cmH20] 16 cmH20 Plateau Pressure:  [17 cmH20-24 cmH20] 17 cmH20   Intake/Output Summary (Last 24 hours) at 05/23/2020 1157 Last data filed at 05/23/2020 0600 Gross per 24 hour  Intake 627.45 ml  Output --  Net 627.45 ml   Filed Weights   05/21/20 1007 05/21/20 1204 05/23/20 0407  Weight: 65.8 kg 65.8 kg 64.6 kg    Examination: General:  In bed on vent HENT: NCAT ETT in place PULM: CTA B, vent supported breathing CV: RRR, no mgr GI: BS+, soft, nontender MSK: normal bulk and tone, no edema Neuro: sedated on vent  Assessment & Plan:   Cardiogenic shock/obstructive shock secondary to PE, RV failure S/p PEA arrest S/p tenecteplase on 11/19 @1827   - Wean vasopressor support. Wean epi, levo  and vasopressin for goal MAP >65 - Obtain co-ox - Consult cardiology  Acute hypoxic respiratory failure - s/p intubation during code. - Full vent support. - Wean as able. - Hold on SBT due to critical illness - Bronchial hygiene. - Follow CXR. - Wean off versed. Fentanyl and precedex for goal RASS 0 and -1  LLE PE - incidental finding on admission.  Received tPA during code.  Of note, there is reportedly a family hx of Factor V Leiden Deficiency. - Discussed case with IR. No indication for catheter directed lytics. Location of PE in LLL. May not provide clinical benefit. -  Continue heparin gtt. - F/u on LE duplex. - Would maintain anticoagulation for life given family hx Factor V Leiden.  AKI - Monitor UOP/Cr  COVID PNA. - Continue steroids, remdesivir.  Colitis. - Supportive care.  Best Practice:  Diet: NPO. Pain/Anxiety/Delirium protocol (if indicated): PAD protocol ordered VAP protocol (if indicated): In place. DVT prophylaxis: Heparin gtt. GI prophylaxis: PPI. Glucose control: SSI if glucose consistently > 180. Mobility: Bedrest. Code Status: Full. Family Communication: Spoke with daughter and sister in the chapel. They understand the gravity of the situation with high risk of not surviving this hospitalization. Will update family later today Disposition: ICU.  Labs   CBC: Recent Labs  Lab 05/20/20 1951 05/22/20 0239 05/22/20 1815 05/22/20 1948 05/23/20 0418  WBC 10.9* 12.9* 26.4*  --  29.8*  NEUTROABS  --  11.6* 24.6*  --  25.8*  HGB 14.3 12.0* 10.4* 11.2* 11.8*  HCT 45.5 36.6* 32.9* 33.0* 35.7*  MCV 91.4 89.3 92.2  --  90.4  PLT 496* 395 423*  --  436*   Basic Metabolic Panel: Recent Labs  Lab 05/20/20 1951 05/20/20 1951 05/22/20 0239 05/22/20 1815 05/22/20 1948 05/22/20 2236 05/23/20 0418  NA 139   < > 138 137 135 137 139  K 3.6   < > 4.3 3.3* 3.5 3.3* 3.5  CL 105  --  108 99  --  103 105  CO2 22  --  23 18*  --  20* 20*  GLUCOSE 98  --  137* 455*  --  404* 351*  BUN 30*  --  21 25*  --  32* 37*  CREATININE 1.06  --  0.75 1.73*  --  1.73* 2.15*  CALCIUM 9.2  --  8.8* 8.0*  --  8.2* 8.4*  MG  --   --  2.1  --   --   --  2.4  PHOS  --   --  3.2  --   --   --  6.2*   < > = values in this interval not displayed.   GFR: Estimated Creatinine Clearance: 30 mL/min (A) (by C-G formula based on SCr of 2.15 mg/dL (H)). Recent Labs  Lab 05/20/20 1951 05/21/20 0012 05/21/20 1211 05/22/20 0239 05/22/20 1815 05/22/20 1818 05/23/20 0418  PROCALCITON  --   --  <0.10  --   --   --   --   WBC 10.9*  --   --  12.9* 26.4*   --  29.8*  LATICACIDVEN  --  1.1  --   --   --  9.6*  --    Liver Function Tests: Recent Labs  Lab 05/20/20 1951 05/22/20 0239 05/23/20 0418  AST 21 27 130*  ALT 18 23 140*  ALKPHOS 64 70 192*  BILITOT 1.9* 0.7 0.7  PROT 7.3 5.9* 5.8*  ALBUMIN 3.1* 2.5* 2.4*  Recent Labs  Lab 05/20/20 1951  LIPASE 31   No results for input(s): AMMONIA in the last 168 hours. ABG    Component Value Date/Time   PHART 7.256 (L) 05/22/2020 1948   PCO2ART 57.5 (H) 05/22/2020 1948   PO2ART 421 (H) 05/22/2020 1948   HCO3 25.6 05/22/2020 1948   TCO2 27 05/22/2020 1948   ACIDBASEDEF 2.0 05/22/2020 1948   O2SAT 100.0 05/22/2020 1948    Coagulation Profile: Recent Labs  Lab 05/22/20 1815  INR 1.8*   Cardiac Enzymes: No results for input(s): CKTOTAL, CKMB, CKMBINDEX, TROPONINI in the last 168 hours. HbA1C: Hgb A1c MFr Bld  Date/Time Value Ref Range Status  05/22/2020 06:15 PM 5.5 4.8 - 5.6 % Final    Comment:    (NOTE) Pre diabetes:          5.7%-6.4%  Diabetes:              >6.4%  Glycemic control for   <7.0% adults with diabetes    CBG: Recent Labs  Lab 05/22/20 1720 05/22/20 2011 05/22/20 2355 05/23/20 0337 05/23/20 0837  GLUCAP 202* 289* 350* 269* 264*    The patient is critically ill with multiple organ systems failure and requires high complexity decision making for assessment and support, frequent evaluation and titration of therapies, application of advanced monitoring technologies and extensive interpretation of multiple databases.  Independent Critical Care Time: 35 Minutes.   Mechele Collin, M.D. [facility] Pulmonary/Critical Care Medicine 05/23/2020 11:57 AM   Please see Amion for pager number to reach on-call Pulmonary and Critical Care Team.

## 2020-05-23 NOTE — Progress Notes (Signed)
  Echocardiogram 2D Echocardiogram has been performed.  Dwayne Barnes 05/23/2020, 8:48 AM

## 2020-05-23 NOTE — Progress Notes (Signed)
ANTICOAGULATION CONSULT NOTE  Pharmacy Consult for Heparin Indication: pulmonary embolus   No Known Allergies  Patient Measurements: Height: 6' (182.9 cm) Weight: 64.6 kg (142 lb 6.7 oz) IBW/kg (Calculated) : 77.6  Vital Signs: Temp: 97.6 F (36.4 C) (11/20 0837) Temp Source: Axillary (11/20 0837) BP: 112/65 (11/20 1147) Pulse Rate: 91 (11/20 1147)  Labs: Recent Labs    05/22/20 0239 05/22/20 0239 05/22/20 1452 05/22/20 1815 05/22/20 1815 05/22/20 1948 05/22/20 2236 05/23/20 0417 05/23/20 0418 05/23/20 1244  HGB 12.0*   < >  --  10.4*   < > 11.2*  --   --  11.8*  --   HCT 36.6*   < >  --  32.9*  --  33.0*  --   --  35.7*  --   PLT 395  --   --  423*  --   --   --   --  436*  --   APTT  --   --   --  50*  --   --   --   --   --   --   LABPROT  --   --   --  20.4*  --   --   --   --   --   --   INR  --   --   --  1.8*  --   --   --   --   --   --   HEPARINUNFRC >2.20*   < > 0.11*  --   --   --   --  0.25*  --  0.35  CREATININE 0.75   < >  --  1.73*   < >  --  1.73*  --  2.15* 2.08*  TROPONINIHS  --   --   --  364*  --   --  2,480*  --  1,792*  --    < > = values in this interval not displayed.    Estimated Creatinine Clearance: 31.1 mL/min (A) (by C-G formula based on SCr of 2.08 mg/dL (H)).  Assessment: 69 y.o. male admitted on 05/21/2020 with N/V/weakness. Found to have PE after CTA obtained due to elevated d-dimer. Patient went into PEA arrest and received tenecteplase due to known PE at 1827 on 05/22/2020.   Pharmacy consulted to dose heparin. Heparin level of 0.35 is therapeutic on heparin 900 units/hr. Level drawn appropriately. Per RN no bleeding.    Goal of Therapy:  Heparin level 0.3-0.5 units/mL through 1830 on 11/20 then increase goal range to 0.3 -0.7 units/mL Monitor platelets by anticoagulation protocol: Yes   Plan:  Continue heparin at 900 units/hr Check heparin level at 1900  Monitor heparin level, CBC and S/S of bleeding daily    Gerrit Halls,  PharmD Clinical Pharmacist

## 2020-05-23 NOTE — Progress Notes (Signed)
ANTICOAGULATION CONSULT NOTE  Pharmacy Consult for Heparin Indication: pulmonary embolus   No Known Allergies  Patient Measurements: Height: 6' (182.9 cm) Weight: 64.6 kg (142 lb 6.7 oz) IBW/kg (Calculated) : 77.6  Vital Signs: Temp: 97.9 F (36.6 C) (11/20 0354) Temp Source: Axillary (11/20 0354) BP: 129/94 (11/19 2000) Pulse Rate: 98 (11/20 0216)  Labs: Recent Labs     0000 05/22/20 0239 05/22/20 0239 05/22/20 1452 05/22/20 1815 05/22/20 1815 05/22/20 1948 05/22/20 2236 05/23/20 0417 05/23/20 0418  HGB  --  12.0*   < >  --  10.4*   < > 11.2*  --   --  11.8*  HCT  --  36.6*   < >  --  32.9*  --  33.0*  --   --  35.7*  PLT  --  395  --   --  423*  --   --   --   --  436*  APTT  --   --   --   --  50*  --   --   --   --   --   LABPROT  --   --   --   --  20.4*  --   --   --   --   --   INR  --   --   --   --  1.8*  --   --   --   --   --   HEPARINUNFRC  --  >2.20*  --  0.11*  --   --   --   --  0.25*  --   CREATININE  --  0.75   < >  --  1.73*  --   --  1.73*  --  2.15*  TROPONINIHS   < >  --   --   --  364*  --   --  2,480*  --  1,792*   < > = values in this interval not displayed.    Estimated Creatinine Clearance: 30 mL/min (A) (by C-G formula based on SCr of 2.15 mg/dL (H)).  Assessment: 69 y.o. male with PE for heparin  Patient coded earlier this evening - got TNKase aptt now 50 - hep was held during code  No bleeding per rn  11/20 AM update:  Heparin level just below goal Hgb stable S/P TNKase yesterday  Goal of Therapy:  Heparin level 0.3-0.7 units/ml - 0.3 to 0.5 through 11/20 1800 Monitor platelets by anticoagulation protocol: Yes   Plan:  Inc heparin to 900 units/hr 1300 heparin level  Abran Duke, PharmD, BCPS Clinical Pharmacist Phone: [phone]

## 2020-05-23 NOTE — Progress Notes (Signed)
OT Cancellation Note  Patient Details Name: Dwayne Barnes MRN: [id number] DOB: [birth year]   Cancelled Treatment:    Reason Eval/Treat Not Completed: Patient not medically ready. Intubated. Heparin levels continue to be subtherapeutic. RN state hold. Will return as schedule allows and pt medically ready.   Demetric Parslow M Ambika Zettlemoyer Enma Maeda MSOT, OTR/L Acute Rehab Pager: [phone] Office: [phone] 05/23/2020, 8:35 AM

## 2020-05-24 ENCOUNTER — Inpatient Hospital Stay (HOSPITAL_COMMUNITY): Payer: Medicare HMO

## 2020-05-24 DIAGNOSIS — I2699 Other pulmonary embolism without acute cor pulmonale: Secondary | ICD-10-CM | POA: Diagnosis not present

## 2020-05-24 DIAGNOSIS — U071 COVID-19: Secondary | ICD-10-CM | POA: Diagnosis not present

## 2020-05-24 HISTORY — PX: IR IVC FILTER PLMT / S&I /IMG GUID/MOD SED: IMG701

## 2020-05-24 LAB — CBC WITH DIFFERENTIAL/PLATELET
Abs Immature Granulocytes: 0.32 10*3/uL — ABNORMAL HIGH (ref 0.00–0.07)
Basophils Absolute: 0.1 10*3/uL (ref 0.0–0.1)
Basophils Relative: 0 %
Eosinophils Absolute: 0 10*3/uL (ref 0.0–0.5)
Eosinophils Relative: 0 %
HCT: 32 % — ABNORMAL LOW (ref 39.0–52.0)
Hemoglobin: 10.3 g/dL — ABNORMAL LOW (ref 13.0–17.0)
Immature Granulocytes: 1 %
Lymphocytes Relative: 5 %
Lymphs Abs: 1.1 10*3/uL (ref 0.7–4.0)
MCH: 29 pg (ref 26.0–34.0)
MCHC: 32.2 g/dL (ref 30.0–36.0)
MCV: 90.1 fL (ref 80.0–100.0)
Monocytes Absolute: 1.6 10*3/uL — ABNORMAL HIGH (ref 0.1–1.0)
Monocytes Relative: 7 %
Neutro Abs: 21 10*3/uL — ABNORMAL HIGH (ref 1.7–7.7)
Neutrophils Relative %: 87 %
Platelets: 361 10*3/uL (ref 150–400)
RBC: 3.55 MIL/uL — ABNORMAL LOW (ref 4.22–5.81)
RDW: 13.9 % (ref 11.5–15.5)
WBC: 24.1 10*3/uL — ABNORMAL HIGH (ref 4.0–10.5)
nRBC: 0 % (ref 0.0–0.2)

## 2020-05-24 LAB — GLUCOSE, CAPILLARY
Glucose-Capillary: 108 mg/dL — ABNORMAL HIGH (ref 70–99)
Glucose-Capillary: 114 mg/dL — ABNORMAL HIGH (ref 70–99)
Glucose-Capillary: 119 mg/dL — ABNORMAL HIGH (ref 70–99)
Glucose-Capillary: 124 mg/dL — ABNORMAL HIGH (ref 70–99)
Glucose-Capillary: 132 mg/dL — ABNORMAL HIGH (ref 70–99)
Glucose-Capillary: 142 mg/dL — ABNORMAL HIGH (ref 70–99)

## 2020-05-24 LAB — COMPREHENSIVE METABOLIC PANEL
ALT: 101 U/L — ABNORMAL HIGH (ref 0–44)
AST: 57 U/L — ABNORMAL HIGH (ref 15–41)
Albumin: 2.2 g/dL — ABNORMAL LOW (ref 3.5–5.0)
Alkaline Phosphatase: 135 U/L — ABNORMAL HIGH (ref 38–126)
Anion gap: 9 (ref 5–15)
BUN: 44 mg/dL — ABNORMAL HIGH (ref 8–23)
CO2: 23 mmol/L (ref 22–32)
Calcium: 8.4 mg/dL — ABNORMAL LOW (ref 8.9–10.3)
Chloride: 109 mmol/L (ref 98–111)
Creatinine, Ser: 1.62 mg/dL — ABNORMAL HIGH (ref 0.61–1.24)
GFR, Estimated: 46 mL/min — ABNORMAL LOW (ref >60)
Glucose, Bld: 163 mg/dL — ABNORMAL HIGH (ref 70–99)
Potassium: 4.4 mmol/L (ref 3.5–5.1)
Sodium: 141 mmol/L (ref 135–145)
Total Bilirubin: 0.8 mg/dL (ref 0.3–1.2)
Total Protein: 4.9 g/dL — ABNORMAL LOW (ref 6.5–8.1)

## 2020-05-24 LAB — HEPARIN LEVEL (UNFRACTIONATED)
Heparin Unfractionated: 0.38 IU/mL (ref 0.30–0.70)
Heparin Unfractionated: 0.5 IU/mL (ref 0.30–0.70)

## 2020-05-24 LAB — PHOSPHORUS: Phosphorus: 2.9 mg/dL (ref 2.5–4.6)

## 2020-05-24 LAB — MAGNESIUM: Magnesium: 2.3 mg/dL (ref 1.7–2.4)

## 2020-05-24 LAB — D-DIMER, QUANTITATIVE: D-Dimer, Quant: >20 ug/mL-FEU — ABNORMAL HIGH (ref 0.00–0.50)

## 2020-05-24 LAB — FERRITIN: Ferritin: 601 ng/mL — ABNORMAL HIGH (ref 24–336)

## 2020-05-24 LAB — C-REACTIVE PROTEIN: CRP: 14.3 mg/dL — ABNORMAL HIGH (ref <1.0)

## 2020-05-24 MED ORDER — AMIODARONE HCL IN DEXTROSE 360-4.14 MG/200ML-% IV SOLN
30.0000 mg/h | INTRAVENOUS | Status: DC
  Administered 2020-05-24 – 2020-05-26 (×5): 30 mg/h via INTRAVENOUS
  Filled 2020-05-24 (×6): qty 200

## 2020-05-24 MED ORDER — DOCUSATE SODIUM 50 MG/5ML PO LIQD
100.0000 mg | Freq: Two times a day (BID) | ORAL | Status: DC
  Administered 2020-05-24 – 2020-05-25 (×3): 100 mg
  Filled 2020-05-24 (×3): qty 10

## 2020-05-24 MED ORDER — ACETAMINOPHEN 160 MG/5ML PO SOLN: 650.0000 mg | Freq: Four times a day (QID) | ORAL | Status: DC | PRN

## 2020-05-24 MED ORDER — POLYETHYLENE GLYCOL 3350 17 G PO PACK
17.0000 g | PACK | Freq: Every day | ORAL | Status: DC
  Administered 2020-05-25: 17 g
  Filled 2020-05-24: qty 1

## 2020-05-24 MED ORDER — AMIODARONE HCL IN DEXTROSE 360-4.14 MG/200ML-% IV SOLN
INTRAVENOUS | Status: AC
  Filled 2020-05-24: qty 200

## 2020-05-24 MED ORDER — LIDOCAINE HCL 1 % IJ SOLN
INTRAMUSCULAR | Status: AC
  Filled 2020-05-24: qty 20

## 2020-05-24 MED ORDER — PREDNISONE 20 MG PO TABS
50.0000 mg | ORAL_TABLET | Freq: Every day | ORAL | Status: DC
  Administered 2020-05-25: 50 mg via NASOGASTRIC
  Filled 2020-05-24: qty 1

## 2020-05-24 MED ORDER — MIDAZOLAM HCL 2 MG/2ML IJ SOLN
INTRAMUSCULAR | Status: AC | PRN
  Administered 2020-05-24: 1 mg via INTRAVENOUS

## 2020-05-24 MED ORDER — AMIODARONE LOAD VIA INFUSION
150.0000 mg | Freq: Once | INTRAVENOUS | Status: AC
  Administered 2020-05-24: 150 mg via INTRAVENOUS
  Filled 2020-05-24: qty 83.34

## 2020-05-24 MED ORDER — AMIODARONE HCL IN DEXTROSE 360-4.14 MG/200ML-% IV SOLN
60.0000 mg/h | INTRAVENOUS | Status: DC
  Administered 2020-05-24 (×2): 60 mg/h via INTRAVENOUS
  Filled 2020-05-24: qty 200

## 2020-05-24 MED ORDER — MIDAZOLAM HCL 2 MG/2ML IJ SOLN
INTRAMUSCULAR | Status: AC
  Filled 2020-05-24: qty 2

## 2020-05-24 NOTE — Progress Notes (Signed)
ANTICOAGULATION CONSULT NOTE  Pharmacy Consult for Heparin Indication: pulmonary embolus   No Known Allergies  Patient Measurements: Height: 6' (182.9 cm) Weight: 64.5 kg (142 lb 3.2 oz) IBW/kg (Calculated) : 77.6  Vital Signs: Temp: 99.1 F (37.3 C) (11/21 0827) Temp Source: Axillary (11/21 0827) BP: 99/50 (11/21 1122) Pulse Rate: 68 (11/21 1122)  Labs: Recent Labs    05/22/20 1815 05/22/20 1815 05/22/20 1948 05/22/20 2236 05/23/20 0417 05/23/20 0418 05/23/20 1244 05/23/20 1244 05/23/20 1835 05/24/20 0416 05/24/20 1015  HGB 10.4*   < > 11.2*  --   --  11.8*  --   --   --  10.3*  --   HCT 32.9*   < > 33.0*  --   --  35.7*  --   --   --  32.0*  --   PLT 423*  --   --   --   --  436*  --   --   --  361  --   APTT 50*  --   --   --   --   --   --   --   --   --   --   LABPROT 20.4*  --   --   --   --   --   --   --   --   --   --   INR 1.8*  --   --   --   --   --   --   --   --   --   --   HEPARINUNFRC  --   --   --   --    < >  --  0.35   < > 0.28* 0.38 0.50  CREATININE 1.73*   < >  --  1.73*  --  2.15* 2.08*  --   --  1.62*  --   TROPONINIHS 364*  --   --  2,480*  --  1,792*  --   --   --   --   --    < > = values in this interval not displayed.    Estimated Creatinine Clearance: 39.8 mL/min (A) (by C-G formula based on SCr of 1.62 mg/dL (H)).  Assessment: 69 y.o. male admitted on 05/21/2020 with N/V/weakness. Found to have PE after CTA obtained due to elevated d-dimer. Patient went into PEA arrest and received tenecteplase due to known PE at 1827 on 05/22/2020.   Pharmacy consulted to dose heparin. Heparin level of 0.50 is therapeutic on 1050 units/hr.   Goal of Therapy:  Heparin level 0.3 -0.7 units/mL now that > 24 hrs since tenecteplase Monitor platelets by anticoagulation protocol: Yes   Plan:  Continue heparin 1050 units/hr Monitor heparin level, CBC and S/S of bleeding daily    Gerrit Halls, PharmD Clinical Pharmacist

## 2020-05-24 NOTE — Progress Notes (Signed)
Assisted tele visit to patient with daughter.  Dwayne Barnes R, RN  

## 2020-05-24 NOTE — Procedures (Signed)
Interventional Radiology Procedure:   Indications: COVID, PE, history of cardiac arrest and left femoral DVT.  Procedure: IVC filter placement with carbon dioxide.    Findings: Renal veins identified.  [place] filter placed in IVC  Complications: None     EBL: less than 10 ml  Plan: Return to ICU.  Continue anticoagulation   Corliss Lamartina R. Lowella Dandy, MD  Pager: [phone]

## 2020-05-24 NOTE — Progress Notes (Signed)
ANTICOAGULATION CONSULT NOTE  Pharmacy Consult for Heparin Indication: pulmonary embolus   No Known Allergies  Patient Measurements: Height: 6' (182.9 cm) Weight: 64.5 kg (142 lb 3.2 oz) IBW/kg (Calculated) : 77.6  Vital Signs: Temp: 98.8 F (37.1 C) (11/21 0400) Temp Source: Axillary (11/21 0400) BP: 107/72 (11/21 0600) Pulse Rate: 83 (11/21 0615)  Labs: Recent Labs    05/22/20 1815 05/22/20 1815 05/22/20 1948 05/22/20 2236 05/23/20 0417 05/23/20 0418 05/23/20 1244 05/23/20 1835 05/24/20 0416  HGB 10.4*   < > 11.2*  --   --  11.8*  --   --  10.3*  HCT 32.9*   < > 33.0*  --   --  35.7*  --   --  32.0*  PLT 423*  --   --   --   --  436*  --   --  361  APTT 50*  --   --   --   --   --   --   --   --   LABPROT 20.4*  --   --   --   --   --   --   --   --   INR 1.8*  --   --   --   --   --   --   --   --   HEPARINUNFRC  --   --   --   --    < >  --  0.35 0.28* 0.38  CREATININE 1.73*   < >  --  1.73*  --  2.15* 2.08*  --  1.62*  TROPONINIHS 364*  --   --  2,480*  --  1,792*  --   --   --    < > = values in this interval not displayed.    Estimated Creatinine Clearance: 39.8 mL/min (A) (by C-G formula based on SCr of 1.62 mg/dL (H)).  Assessment: 69 y.o. male with PE for heparin  Patient coded earlier this evening - got TNKase aptt now 50 - hep was held during code  No bleeding per rn  11/21 AM update:  Heparin level at goal x 1 S/P TNKase 2 days ago  Goal of Therapy:  Heparin level 0.3-0.7 units/ml  Monitor platelets by anticoagulation protocol: Yes   Plan:  Cont heparin 1050 units/hr 1200 heparin level  Abran Duke, PharmD, BCPS Clinical Pharmacist Phone: [phone]

## 2020-05-24 NOTE — Progress Notes (Signed)
  Amiodarone Drug - Drug Interaction Consult Note  Recommendations: No current DDI  Amiodarone is metabolized by the cytochrome P450 system and therefore has the potential to cause many drug interactions. Amiodarone has an average plasma half-life of 50 days (range 20 to 100 days).   There is potential for drug interactions to occur several weeks or months after stopping treatment and the onset of drug interactions may be slow after initiating amiodarone.   []  Statins: Increased risk of myopathy. Simvastatin- restrict dose to 20mg  daily. Other statins: counsel patients to report any muscle pain or weakness immediately.  []  Anticoagulants: Amiodarone can increase anticoagulant effect. Consider warfarin dose reduction. Patients should be monitored closely and the dose of anticoagulant altered accordingly, remembering that amiodarone levels take several weeks to stabilize.  []  Antiepileptics: Amiodarone can increase plasma concentration of phenytoin, the dose should be reduced. Note that small changes in phenytoin dose can result in large changes in levels. Monitor patient and counsel on signs of toxicity.  []  Beta blockers: increased risk of bradycardia, AV block and myocardial depression. Sotalol - avoid concomitant use.  []   Calcium channel blockers (diltiazem and verapamil): increased risk of bradycardia, AV block and myocardial depression.  []   Cyclosporine: Amiodarone increases levels of cyclosporine. Reduced dose of cyclosporine is recommended.  []  Digoxin dose should be halved when amiodarone is started.  []  Diuretics: increased risk of cardiotoxicity if hypokalemia occurs.  []  Oral hypoglycemic agents (glyburide, glipizide, glimepiride): increased risk of hypoglycemia. Patient's glucose levels should be monitored closely when initiating amiodarone therapy.   []  Drugs that prolong the QT interval:  Torsades de pointes risk may be increased with concurrent use - avoid if possible.   Monitor QTc, also keep magnesium/potassium WNL if concurrent therapy can't be avoided. Antibiotics: e.g. fluoroquinolones, erythromycin. . Antiarrhythmics: e.g. quinidine, procainamide, disopyramide, sotalol. . Antipsychotics: e.g. phenothiazines, haloperidol.  . Lithium, tricyclic antidepressants, and methadone. Thank You,   , PharmD Clinical Pharmacist  05/24/2020 10:16 AM

## 2020-05-24 NOTE — Consult Note (Signed)
Chief Complaint: Patient was seen in consultation today for DVT/IVCF placement.  Referring Physician(s): Luciano Cutter (CCM)  Supervising Physician: Richarda Overlie  Patient Status: [facility] - In-pt  History of Present Illness: Dwayne Barnes is a 69 y.o. male with an unremarkable past medical history who presented to [facility] ED 05/21/2020 for management of back pain, N/V, and generalized weakness. In ED, patient found to have generalized colitis and was positive for COVID-19. CT chest revealed large LLL PE. He was admitted for further management and started on heparin gtt. Hospital course complicated by cardiac arrest 11/19 (total down time 2-6 minutes before ROSC)- he was intubated during this and transferred to ICU for escalation of care. Follow-up DVT study revealed acute DVT of left femoral vein with acute/mobile thrombus of proximal/mid femoral vein.  IR consulted by Dr. Everardo All for possible image-guided IVCF placement. Patient sitting in bed, alert but intubated with sedation, eyes follow around room. History difficult to obtain secondary to intubation/encephalopathy.  On Heparin gtt.   History reviewed. No pertinent past medical history.  History reviewed. No pertinent surgical history.  Allergies: Patient has no known allergies.  Medications: Prior to Admission medications   Medication Sig Start Date End Date Taking? Authorizing Provider  guaiFENesin (MUCINEX) 600 MG 12 hr tablet Take 600 mg by mouth 2 (two) times daily as needed for cough or to loosen phlegm.   Yes [provider]  hydrocortisone cream 1 % Apply 1 application topically as needed (for bite).    Yes [provider]  ibuprofen (ADVIL,MOTRIN) 200 MG tablet Take 400 mg by mouth 2 (two) times daily as needed for moderate pain.   Yes [provider]  Multiple Vitamins-Minerals (ALIVE MENS ENERGY) TABS Take 1 tablet by mouth daily.   Yes [provider]  Neomycin-Bacitracin-Polymyxin  (NEOSPORIN ORIGINAL) [phone] OINT Apply 1 application topically as needed (for bite).   Yes [provider]  Omega-3 Fatty Acids (OMEGA 3 PO) Take 1 capsule by mouth daily.   Yes [provider]  OVER THE COUNTER MEDICATION Take 1 tablet by mouth daily. "ANDROZONE" TESTOSTERONE SUPPLEMENT   Yes [provider]     Family History  Problem Relation Age of Onset  . Stroke Mother 41  . Factor V Leiden deficiency Other     Social History   Socioeconomic History  . Marital status: Widowed    Spouse name: Not on file  . Number of children: Not on file  . Years of education: Not on file  . Highest education level: Not on file  Occupational History  . Occupation: Unemployed  Tobacco Use  . Smoking status: Former Games developer  . Smokeless tobacco: Never Used  Vaping Use  . Vaping Use: Never used  Substance and Sexual Activity  . Alcohol use: No  . Drug use: No  . Sexual activity: Not on file  Other Topics Concern  . Not on file  Social History Narrative  . Not on file   Social Determinants of Health   Financial Resource Strain:   . Difficulty of Paying Living Expenses: Not on file  Food Insecurity:   . Worried About Programme researcher, broadcasting/film/video in the Last Year: Not on file  . Ran Out of Food in the Last Year: Not on file  Transportation Needs:   . Lack of Transportation (Medical): Not on file  . Lack of Transportation (Non-Medical): Not on file  Physical Activity:   . Days of Exercise per Week: Not on  file  . Minutes of Exercise per Session: Not on file  Stress:   . Feeling of Stress : Not on file  Social Connections:   . Frequency of Communication with Friends and Family: Not on file  . Frequency of Social Gatherings with Friends and Family: Not on file  . Attends Religious Services: Not on file  . Active Member of Clubs or Organizations: Not on file  . Attends Banker Meetings: Not on file  . Marital Status: Not on file     Review of  Systems: A 12 point ROS discussed and pertinent positives are indicated in the HPI above.  All other systems are negative.  Review of Systems  Unable to perform ROS: Intubated    Vital Signs: BP (!) 99/50   Pulse 68   Temp 99.1 F (37.3 C) (Axillary)   Resp 16   Ht 6' (1.829 m)   Wt 142 lb 3.2 oz (64.5 kg)   SpO2 100%   BMI 19.29 kg/m   Physical Exam Vitals and nursing note reviewed.  Constitutional:      General: He is not in acute distress.    Appearance: He is ill-appearing.     Comments: Intubated with sedation.  Cardiovascular:     Rate and Rhythm: Normal rate and regular rhythm.     Heart sounds: Normal heart sounds. No murmur heard.   Pulmonary:     Effort: Pulmonary effort is normal. No respiratory distress.     Breath sounds: No wheezing.     Comments: Intubated with sedation. Skin:    General: Skin is warm and dry.  Neurological:     Mental Status: He is alert.     Comments: Intubated with sedation. Eyes follow around room.      MD Evaluation Airway: Other (comments) (Intubated.) Heart: WNL Abdomen: WNL Chest/ Lungs: WNL ASA  Classification: 3 Mallampati/Airway Score: Three (Intubated.)   Imaging: CT ANGIO CHEST PE W OR WO CONTRAST  Addendum Date: 05/21/2020   ADDENDUM REPORT: 05/21/2020 19:28 ADDENDUM: Critical Value/emergent results were called by telephone at the time of interpretation on 05/21/2020 at 7:27 pm to provider Dr. Dierdre Highman, who verbally acknowledged these results. Of note the airspace disease in the LEFT lower lobe is progressed from CT same day. Differential remains the same. Electronically Signed   By: Genevive Bi M.D.   On: 05/21/2020 19:28   Result Date: 05/21/2020 CLINICAL DATA:  PE suspected, high probability.  COVID-19 infection EXAM: CT ANGIOGRAPHY CHEST WITH CONTRAST TECHNIQUE: Multidetector CT imaging of the chest was performed using the standard protocol during bolus administration of intravenous contrast. Multiplanar CT  image reconstructions and MIPs were obtained to evaluate the vascular anatomy. CONTRAST:  75mL OMNIPAQUE IOHEXOL 300 MG/ML  SOLN COMPARISON:  None. FINDINGS: Cardiovascular: Large filling defect within the proximal LEFT lower lobe pulmonary artery. Filling defect spans the bifurcation of the LEFT lower lobe and lingular branch pulmonary branches. This this filling defect/thrombus is occlusive to the LEFT lower lobe. No LEFT upper lobe filling defects identified. No filling defect identified within the RIGHT lung pulmonary arteries. No evidence of RIGHT ventricular strain with the RV to LV ratio less than 1. Coronary artery calcification and aortic atherosclerotic calcification. Mediastinum/Nodes: No axillary or supraclavicular adenopathy. No mediastinal or hilar adenopathy. No pericardial fluid. Esophagus normal. Lungs/Pleura: There is diffuse very fine airspace disease within the LEFT lower lobe involving the majority of the LEFT lower lobe. Similar milder findings in the posterior RIGHT lower lobe  and lingula. Upper Abdomen: Limited view of the liver, kidneys, pancreas are unremarkable. Normal adrenal glands. Musculoskeletal: No aggressive osseous lesion. Review of the MIP images confirms the above findings. IMPRESSION: 1. Large occlusive pulmonary embolism within the proximal LEFT lower lobe pulmonary artery and lingular pulmonary artery. 2. Diffuse fine airspace disease within the LEFT lower lobe with differential including pulmonary infarction, pulmonary hemorrhage, or pneumonia (including COVID pneumonia). 3. No evidence of RIGHT ventricular strain. 4. Coronary artery calcification and aortic atherosclerotic calcification. Critical Value/emergent results were called by telephone at the time of interpretation on 05/21/2020 at 6:55 pm to provider [facility] , who verbally acknowledged these results. Electronically Signed: By: Genevive Bi M.D. On: 05/21/2020 18:59   DG CHEST PORT 1 VIEW  Result Date:  05/22/2020 CLINICAL DATA:  Central line placement, intubation EXAM: PORTABLE CHEST 1 VIEW COMPARISON:  05/21/2020 FINDINGS: Left central line has been placed with the tip in the SVC. No pneumothorax. Endotracheal tube is 5 cm above the carina. Patchy airspace disease throughout the left lung. No focal opacity on the right. Heart is normal size. IMPRESSION: Endotracheal tube 5 cm above the carina. Left central line tip in the SVC. No pneumothorax. Stable patchy left lung airspace disease. Electronically Signed   By: Charlett Nose M.D.   On: 05/22/2020 21:06   DG Chest Port 1 View  Result Date: 05/21/2020 CLINICAL DATA:  Cough, lower back pain, has not eaten 5 days, emesis EXAM: PORTABLE CHEST 1 VIEW COMPARISON:  Radiograph 04/19/2010, CT 04/18/2010 FINDINGS: Heterogeneous opacities present predominantly along the periphery of the left lung and minimally in the right lung base as well. No pneumothorax or effusion. The aorta is calcified. The remaining cardiomediastinal contours are unremarkable. No acute osseous or soft tissue abnormality. Degenerative changes are present in the imaged spine and shoulders. Telemetry leads overlie the chest. IMPRESSION: Heterogeneous opacities throughout the lungs, greatest in the left lung periphery concerning for pneumonia including potential viral etiology. Aortic Atherosclerosis (ICD10-I70.0). Electronically Signed   By: Kreg Shropshire M.D.   On: 05/21/2020 03:50   DG Abd Portable 1V  Result Date: 05/23/2020 CLINICAL DATA:  Encounter for orogastric tube placement. EXAM: PORTABLE ABDOMEN - 1 VIEW COMPARISON:  06/09/2009 and chest radiograph 05/22/2020 FINDINGS: Gastric tube extends into the abdomen and the tip is in the midline of the lower abdomen, likely in the distal stomach body region. Again noted are patchy densities at the left lung base. Few gas-filled loops of bowel in the abdomen. IMPRESSION: Orogastric tube in the lower abdominal midline and likely in the distal  stomach body region. Electronically Signed   By: Richarda Overlie M.D.   On: 05/23/2020 12:17   ECHOCARDIOGRAM COMPLETE  Result Date: 05/23/2020    ECHOCARDIOGRAM REPORT   Patient Name:   PIER BOSHER Date of Exam: 05/23/2020 Medical Rec #:  [id number]      Height:       72.0 in Accession #:    [id number]     Weight:       142.4 lb Date of Birth:  17-Apr-1951      BSA:          1.844 m Patient Age:    69 years       BP:           112/85 mmHg Patient Gender: M              HR:           96 bpm.  Exam Location:  Inpatient Procedure: 2D Echo, Cardiac Doppler and Color Doppler STAT ECHO Indications:    I26.02 Pulmonary embolus  History:        Patient has no prior history of Echocardiogram examinations.                 Arrythmias:Cardiac Arrest; Signs/Symptoms:Dyspnea, Hypotension                 and Altered Mental Status. Covid 19 positive.  Sonographer:    Sheralyn Boatman RDCS Referring Phys: [id number] Martina Sinner  Sonographer Comments: Technically difficult study due to poor echo windows and echo performed with patient supine and on artificial respirator. IMPRESSIONS  1. Severe RV failure and cor pulmonale.  2. Abnormal septal motion and septal flattening consistent with RV pressure overload. Left ventricular ejection fraction, by estimation, is 50 to 55%. The left ventricle has low normal function. The left ventricle has no regional wall motion abnormalities. Left ventricular diastolic parameters were normal.  3. Right ventricular systolic function is severely reduced. The right ventricular size is severely enlarged. There is mildly elevated pulmonary artery systolic pressure.  4. The mitral valve is normal in structure. No evidence of mitral valve regurgitation. No evidence of mitral stenosis.  5. The aortic valve is normal in structure. Aortic valve regurgitation is not visualized. No aortic stenosis is present.  6. The inferior vena cava is dilated in size with <50% respiratory variability, suggesting right atrial  pressure of 15 mmHg. FINDINGS  Left Ventricle: Abnormal septal motion and septal flattening consistent with RV pressure overload. Left ventricular ejection fraction, by estimation, is 50 to 55%. The left ventricle has low normal function. The left ventricle has no regional wall motion abnormalities. The left ventricular internal cavity size was normal in size. There is no left ventricular hypertrophy. Left ventricular diastolic parameters were normal. Right Ventricle: The right ventricular size is severely enlarged. Right vetricular wall thickness was not assessed. Right ventricular systolic function is severely reduced. There is mildly elevated pulmonary artery systolic pressure. The tricuspid regurgitant velocity is 2.67 m/s, and with an assumed right atrial pressure of 8 mmHg, the estimated right ventricular systolic pressure is 36.5 mmHg. Left Atrium: Left atrial size was normal in size. Right Atrium: Right atrial size was normal in size. Pericardium: There is no evidence of pericardial effusion. Mitral Valve: The mitral valve is normal in structure. No evidence of mitral valve regurgitation. No evidence of mitral valve stenosis. Tricuspid Valve: The tricuspid valve is normal in structure. Tricuspid valve regurgitation is mild . No evidence of tricuspid stenosis. Aortic Valve: The aortic valve is normal in structure. Aortic valve regurgitation is not visualized. No aortic stenosis is present. Pulmonic Valve: The pulmonic valve was normal in structure. Pulmonic valve regurgitation is not visualized. No evidence of pulmonic stenosis. Aorta: The aortic root is normal in size and structure. Venous: The inferior vena cava is dilated in size with less than 50% respiratory variability, suggesting right atrial pressure of 15 mmHg. IAS/Shunts: No atrial level shunt detected by color flow Doppler. Additional Comments: Severe RV failure and cor pulmonale.  LEFT VENTRICLE PLAX 2D LVIDd:         3.70 cm     Diastology LVIDs:          2.80 cm     LV e' medial:    4.46 cm/s LV PW:         1.10 cm     LV E/e' medial:  4.9 LV  IVS:        1.10 cm     LV e' lateral:   6.97 cm/s LVOT diam:     2.10 cm     LV E/e' lateral: 3.1 LV SV:         28 LV SV Index:   15 LVOT Area:     3.46 cm  LV Volumes (MOD) LV vol d, MOD A2C: 22.6 ml LV vol d, MOD A4C: 25.5 ml LV vol s, MOD A2C: 11.1 ml LV vol s, MOD A4C: 11.7 ml LV SV MOD A2C:     11.5 ml LV SV MOD A4C:     25.5 ml LV SV MOD BP:      13.7 ml RIGHT VENTRICLE            IVC RV S prime:     8.84 cm/s  IVC diam: 2.50 cm TAPSE (M-mode): 1.3 cm LEFT ATRIUM           Index      RIGHT ATRIUM           Index LA diam:      2.80 cm 1.52 cm/m RA Area:     14.30 cm LA Vol (A4C): 10.5 ml 5.69 ml/m RA Volume:   42.70 ml  23.15 ml/m  AORTIC VALVE LVOT Vmax:   53.80 cm/s LVOT Vmean:  41.900 cm/s LVOT VTI:    0.080 m  AORTA Ao Root diam: 3.60 cm Ao Asc diam:  3.50 cm MITRAL VALVE               TRICUSPID VALVE MV Area (PHT): 3.27 cm    TR Peak grad:   28.5 mmHg MV Decel Time: 232 msec    TR Vmax:        267.00 cm/s MV E velocity: 21.94 cm/s MV A velocity: 41.30 cm/s  SHUNTS MV E/A ratio:  0.53        Systemic VTI:  0.08 m                            Systemic Diam: 2.10 cm Charlton Haws MD Electronically signed by Charlton Haws MD Signature Date/Time: 05/23/2020/10:46:38 AM    Final    CT RENAL STONE STUDY  Result Date: 05/21/2020 CLINICAL DATA:  69 year old male with low back pain, flank pain, decreased p.o. EXAM: CT ABDOMEN AND PELVIS WITHOUT CONTRAST TECHNIQUE: Multidetector CT imaging of the abdomen and pelvis was performed following the standard protocol without IV contrast. COMPARISON:  Noncontrast CT Abdomen and Pelvis 10/28/2008. Portable chest radiograph 04/19/2010. FINDINGS: Lower chest: Patchy, irregular peribronchial and peripheral scattered pulmonary opacity at both lung bases. This seems to be new from last month. Underlying probable pulmonary hyperinflation. Some associated lung base  bronchiectasis. No cavitating areas identified. No cardiomegaly, pericardial effusion or pleural effusion. Hepatobiliary: Negative noncontrast liver and gallbladder. Pancreas: Negative. Spleen: Negative. Adrenals/Urinary Tract: Bulky left nephrolithiasis measuring 13 mm at the renal pelvis. Additional left lower pole stone. No hydronephrosis. Negative noncontrast right kidney. No hydroureter. Unremarkable urinary bladder. Incidental pelvic phleboliths. Stomach/Bowel: Decompressed large bowel. There is long segment circumferential wall thickening in the right colon, up to the 12 mm series 4, image 49) with some areas of adjacent mesenteric stranding (coronal image 43). The wall thickening gradually decreases through the transverse colon. No free air. No free fluid. The terminal ileum appear spared. Appendix seems to remain normal on coronal image 32. No dilated small bowel.  Decompressed stomach and duodenum. Vascular/Lymphatic: Normal caliber abdominal aorta. Mild calcified atherosclerosis. Vascular patency is not evaluated in the absence of IV contrast. No lymphadenopathy identified in the absence of contrast. Reproductive: Negative. Other: Previous lower abdominal ventral hernia repair with mesh. No pelvic free fluid. Musculoskeletal: Degenerative changes in the spine. No acute osseous abnormality identified. IMPRESSION: 1. In the abdomen there is circumferential bowel wall thickening and mesenteric stranding involving the right colon. Wall thickening gradually abates through the transverse colon. This is compatible with Acute Nonspecific Colitis. 2. But the lung bases are also abnormal with patchy and irregular peribronchial and peripheral pulmonary opacity suspicious for acute viral/atypical respiratory infection. No areas of cavitation to suggest septic emboli. No pleural effusion. 3. Bulky left nephrolithiasis, but no obstructive uropathy. 4. Previous lower abdominal hernia repair with mesh. Electronically  Signed   By: Odessa Fleming M.D.   On: 05/21/2020 02:46   VAS [place] LOWER EXTREMITY VENOUS (DVT)  Result Date: 05/24/2020  Lower Venous DVT Study Indications: Covid-19, pulmonary embolism.  Risk Factors: Confirmed PE. Anticoagulation: Heparin. Limitations: Body habitus, ventilation, did not perform compressions in thigh and popliteal fossa of the left lower extremity secondary to mobile thrombus, bandages and line. Comparison Study: No prior study Performing Technologist: Sherren Kerns RVS  Examination Guidelines: A complete evaluation includes B-mode imaging, spectral Doppler, color Doppler, and power Doppler as needed of all accessible portions of each vessel. Bilateral testing is considered an integral part of a complete examination. Limited examinations for reoccurring indications may be performed as noted. The reflux portion of the exam is performed with the patient in reverse Trendelenburg.  +---------+---------------+---------+-----------+----------+--------------+ RIGHT    CompressibilityPhasicitySpontaneityPropertiesThrombus Aging +---------+---------------+---------+-----------+----------+--------------+ CFV      Full           Yes      No                                  +---------+---------------+---------+-----------+----------+--------------+ SFJ      Full                                                        +---------+---------------+---------+-----------+----------+--------------+ FV Prox  Full                                                        +---------+---------------+---------+-----------+----------+--------------+ FV Mid   Full                                                        +---------+---------------+---------+-----------+----------+--------------+ FV DistalFull                                                        +---------+---------------+---------+-----------+----------+--------------+ PFV      Full                                                         +---------+---------------+---------+-----------+----------+--------------+  POP      Full           Yes      No                                  +---------+---------------+---------+-----------+----------+--------------+ PTV      Full                                                        +---------+---------------+---------+-----------+----------+--------------+ PERO     Full                                                        +---------+---------------+---------+-----------+----------+--------------+   +---------+---------------+---------+-----------+----------+-------------------+ LEFT     CompressibilityPhasicitySpontaneityPropertiesThrombus Aging      +---------+---------------+---------+-----------+----------+-------------------+ CFV                                                   not visualized                                                            secondary to                                                              bandage/line        +---------+---------------+---------+-----------+----------+-------------------+ SFJ                                                   not visualized                                                            secondary to                                                              bandage/line        +---------+---------------+---------+-----------+----------+-------------------+ FV Prox  Full  patent by color     +---------+---------------+---------+-----------+----------+-------------------+ FV Mid                                                patent by color     +---------+---------------+---------+-----------+----------+-------------------+ FV Distal                                             patent by color     +---------+---------------+---------+-----------+----------+-------------------+ PFV                                                    patent by color     +---------+---------------+---------+-----------+----------+-------------------+ POP                                                   patent by color     +---------+---------------+---------+-----------+----------+-------------------+ PTV      Full                                                             +---------+---------------+---------+-----------+----------+-------------------+ PERO     Full                                                             +---------+---------------+---------+-----------+----------+-------------------+     Summary: RIGHT: - There is no evidence of deep vein thrombosis in the lower extremity.  vessels are dilated with significantly sluggish flow, throughout  LEFT: - Findings consistent with acute deep vein thrombosis involving the left femoral vein. - Acute, mobile thrombus noted in the proximal and mid femoral vein. Vessels are dilated with significantly sluggish flow, throughout.  *See table(s) above for measurements and observations.    Preliminary     Labs:  CBC: Recent Labs    05/22/20 0239 05/22/20 0239 05/22/20 1815 05/22/20 1948 05/23/20 0418 05/24/20 0416  WBC 12.9*  --  26.4*  --  29.8* 24.1*  HGB 12.0*   < > 10.4* 11.2* 11.8* 10.3*  HCT 36.6*   < > 32.9* 33.0* 35.7* 32.0*  PLT 395  --  423*  --  436* 361   < > = values in this interval not displayed.    COAGS: Recent Labs    05/22/20 1815  INR 1.8*  APTT 50*    BMP: Recent Labs    05/22/20 2236 05/23/20 0418 05/23/20 1244 05/24/20 0416  NA 137 139 141 141  K 3.3* 3.5 4.2 4.4  CL 103 105 105 109  CO2 20* 20* 23 23  GLUCOSE 404* 351* 160* 163*  BUN 32* 37* 43* 44*  CALCIUM 8.2* 8.4* 8.3* 8.4*  CREATININE 1.73* 2.15* 2.08* 1.62*  GFRNONAA 42* 33* 34* 46*    LIVER FUNCTION TESTS: Recent Labs    05/20/20 1951 05/22/20 0239 05/23/20 0418 05/24/20 0416  BILITOT 1.9* 0.7 0.7 0.8  AST 21 27  130* 57*  ALT 18 23 140* 101*  ALKPHOS 64 70 192* 135*  PROT 7.3 5.9* 5.8* 4.9*  ALBUMIN 3.1* 2.5* 2.4* 2.2*     Assessment and Plan:  Recent COVID-19 infection diagnosis with large LLE PE and acute DVT (of left femoral vein with mobile thrombus of proximal/mid femoral vein) currently on heparin gtt. Plan for image-guided IVCF placement today in IR. Patient is NPO. Afebrile.  Risks and benefits discussed with the patient including, but not limited to bleeding, infection, contrast induced renal failure, filter fracture or migration which can lead to emergency surgery or even death, strut penetration with damage or irritation to adjacent structures and caval thrombosis. All of the patient's daughter's questions were answered, she is agreeable to proceed. Consent obtained by patient's daughter, Dwayne Barnes, via telephone- signed and in IR control room.   Thank you for this interesting consult.  I greatly enjoyed meeting Dwayne Barnes and look forward to participating in their care.  A copy of this report was sent to the requesting provider on this date.  Electronically Signed: Elwin Mocha, PA-C 05/24/2020, 1:41 PM   I spent a total of 40 Minutes in face to face in clinical consultation, greater than 50% of which was counseling/coordinating care for DVT/IVCF placement.

## 2020-05-24 NOTE — Progress Notes (Signed)
VASCULAR LAB    Bilateral lower extremity venous duplex has been performed.  See CV proc for preliminary results.  Gave results to Sheralyn Boatman, RN, and messaged Dr.Ellison with results. Krystle Polcyn, RVT 05/24/2020, 12:04 PM

## 2020-05-24 NOTE — Progress Notes (Signed)
PT Cancellation Note  Patient Details Name: Dwayne Barnes MRN: [id number] DOB: December 06, 1950   Cancelled Treatment:    Reason Eval/Treat Not Completed: Patient not medically ready (Pt remains sedated; discussed with RN, will attempt tomorrow if pt appropriate).  Lillia Pauls, PT, DPT Acute Rehabilitation Services Pager [phone] Office [phone]    Norval Morton 05/24/2020, 7:34 AM

## 2020-05-24 NOTE — Progress Notes (Addendum)
NAME:  Dwayne Barnes, MRN:  [id number], DOB:  [birth year], LOS: 3 ADMISSION DATE:  05/20/2020, CONSULTATION DATE:  05/22/20 REFERRING MD:  Elgergawy  CHIEF COMPLAINT:  Cardiac Arrest   Brief History   Dwayne Barnes is a 69 y.o. male who was admitted 11/18 with N/V, weakness.  Workup in ED was noteable for pyuria and positive COVID test. He later had CTA due to markedly elevated D-dimer which demonstrated large LLL PE.  He was started on heparin gtt along with steroids and remdesivir. Unfortunately late afternoon 11/19, he had a cardiac arrest.  He was intubated during the code.  Total downtime of 2-6 minutes before ROSC.  History of present illness   Pt is encephelopathic; therefore, this HPI is obtained from chart review. Dwayne Barnes is a 69 y.o. male who has no significant PMH.  He presented to [facility] ED 11/18 with N/V, weakness and was found to have generalized colitis as well as incidental finding of COVID positive and large LLL PE.  He was started on heparin as well as steroids and remdesivir.  Unfortunately late PM 11/19, he had cardiac arrest.  He required 2 rounds of CPR before ROSC with total downtime of 2-6 minutes.  He was intubated during the code and was then transferred to the ICU where PCCM assumed his care. Soon after arrival in the ICU he coded again where 4 rounds of CPR were provided with ROSC.   A left IJ central line was placed in sterile fashion and a left femoral a-line was placed in sterile fashion. He is on levophed and vasopressin. He is on fentanyl and versed for sedation.   Past Medical History  has Generalized weakness; COVID-19 virus infection; and Acute pulmonary embolism (HCC) on their problem list.  Significant Hospital Events   11/19 PEA cardiac arrest 11/19 Intubated  Consults:  PCCM  Procedures:  11/19 Left IJ CVL 11/19 Endotracheal Tube 11/19 Left fem a-line  Significant Diagnostic Tests:  CT renal stone 11/18 > non specific colitis, GGO's, left  nephrolithiasis without obstructive uropathy. CTA chest 11/18 > large PC in LLL, GGO's LLL.  Micro Data:  Urine 11/18 >  COVID 11/18 > positive. Flu 11/18 > neg.  Antimicrobials:  Ceftriaxone x 1 in ED 11/18.   Interim history/subjective:  Off Epi and continues to . Awake and alert this morning that extubation was considered however became acutely agitated and confused. Patient remained intubated and sedation was resumed for safety.  Objective:  Blood pressure (!) 121/100, pulse 89, temperature 99.1 F (37.3 C), temperature source Axillary, resp. rate 16, height 6' (1.829 m), weight 64.5 kg, SpO2 100 %.    Vent Mode: PRVC FiO2 (%):  [30 %-60 %] 60 % Set Rate:  [16 bmp] 16 bmp Vt Set:  [620 mL] 620 mL PEEP:  [8 cmH20] 8 cmH20 Plateau Pressure:  [16 cmH20-21 cmH20] 18 cmH20   Intake/Output Summary (Last 24 hours) at 05/24/2020 1125 Last data filed at 05/24/2020 1000 Gross per 24 hour  Intake 1100.49 ml  Output 1400 ml  Net -299.51 ml   Filed Weights   05/21/20 1204 05/23/20 0407 05/24/20 0500  Weight: 65.8 kg 64.6 kg 64.5 kg    Examination: General:  In bed on vent HENT: NCAT ETT in place PULM: CTA B, vent supported breathing CV: RRR, no mgr GI: BS+, soft, nontender MSK: normal bulk and tone, no edema Neuro: sedated on vent  Assessment & Plan:   Obstructive shock secondary to PE  with subsequent RV failure S/p PEA arrest S/p tenecteplase on 11/19 @1827   - Off epi - Wean levo and vasopressin for goal MAP >65 - Discussed case with Dr. , HF/Cardiology. No indication for impella support at this time with improving pressor requirement  LLE PE - incidental finding on admission.  Received tPA during code.   Of note, there is reportedly a family hx of Factor V Leiden Deficiency however daughter declined this history - No indication for catheter directed lytics per IR. May not provide clinical benefit. - Continue heparin gtt  Acute DVT in left femoral -  Consult IR regarding IVC filter. Although patient is anticoagulated, risk of additional clot burden on top of poor RV function. IVC filter would prevent clot from causing additional cardiac stress. Patient already had PEA arrest and would benefit from IVC filter. Discussed with Cardiolgy, Dr. Gala Romney who agrees with risk of clot.  Acute hypoxic respiratory failure - s/p intubation during code. - Full vent support. - Wean as able. - Hold on SBT due to critical illness - Bronchial hygiene. - Follow CXR. - Wean off versed. Fentanyl and precedex for goal RASS 0 and -1  AKI - improving - Monitor UOP/Cr  COVID PNA. - Continue steroids, remdesivir.  Colitis. - Supportive care.  Best Practice:  Diet: NPO. Pain/Anxiety/Delirium protocol (if indicated): PAD protocol ordered VAP protocol (if indicated): In place. DVT prophylaxis: Heparin gtt. GI prophylaxis: PPI. Glucose control: SSI if glucose consistently > 180. Mobility: Bedrest. Code Status: Full. Family Communication: Updated daughter 11/21 Disposition: ICU.  Labs   CBC: Recent Labs  Lab 05/20/20 1951 05/20/20 1951 05/22/20 0239 05/22/20 1815 05/22/20 1948 05/23/20 0418 05/24/20 0416  WBC 10.9*  --  12.9* 26.4*  --  29.8* 24.1*  NEUTROABS  --   --  11.6* 24.6*  --  25.8* 21.0*  HGB 14.3   < > 12.0* 10.4* 11.2* 11.8* 10.3*  HCT 45.5   < > 36.6* 32.9* 33.0* 35.7* 32.0*  MCV 91.4  --  89.3 92.2  --  90.4 90.1  PLT 496*  --  395 423*  --  436* 361   < > = values in this interval not displayed.   Basic Metabolic Panel: Recent Labs  Lab 05/22/20 0239 05/22/20 0239 05/22/20 1815 05/22/20 1815 05/22/20 1948 05/22/20 2236 05/23/20 0418 05/23/20 1244 05/24/20 0416  NA 138   < > 137   < > 135 137 139 141 141  K 4.3   < > 3.3*   < > 3.5 3.3* 3.5 4.2 4.4  CL 108   < > 99  --   --  103 105 105 109  CO2 23   < > 18*  --   --  20* 20* 23 23  GLUCOSE 137*   < > 455*  --   --  404* 351* 160* 163*  BUN 21   < > 25*  --    --  32* 37* 43* 44*  CREATININE 0.75   < > 1.73*  --   --  1.73* 2.15* 2.08* 1.62*  CALCIUM 8.8*   < > 8.0*  --   --  8.2* 8.4* 8.3* 8.4*  MG 2.1  --   --   --   --   --  2.4  --  2.3  PHOS 3.2  --   --   --   --   --  6.2*  --  2.9   < > = values in  this interval not displayed.   GFR: Estimated Creatinine Clearance: 39.8 mL/min (A) (by C-G formula based on SCr of 1.62 mg/dL (H)). Recent Labs  Lab 05/20/20 1951 05/21/20 0012 05/21/20 1211 05/22/20 0239 05/22/20 1815 05/22/20 1818 05/23/20 0418 05/24/20 0416  PROCALCITON  --   --  <0.10  --   --   --   --   --   WBC   < >  --   --  12.9* 26.4*  --  29.8* 24.1*  LATICACIDVEN  --  1.1  --   --   --  9.6*  --   --    < > = values in this interval not displayed.   Liver Function Tests: Recent Labs  Lab 05/20/20 1951 05/22/20 0239 05/23/20 0418 05/24/20 0416  AST 21 27 130* 57*  ALT 18 23 140* 101*  ALKPHOS 64 70 192* 135*  BILITOT 1.9* 0.7 0.7 0.8  PROT 7.3 5.9* 5.8* 4.9*  ALBUMIN 3.1* 2.5* 2.4* 2.2*   Recent Labs  Lab 05/20/20 1951  LIPASE 31   No results for input(s): AMMONIA in the last 168 hours. ABG    Component Value Date/Time   PHART 7.256 (L) 05/22/2020 1948   PCO2ART 57.5 (H) 05/22/2020 1948   PO2ART 421 (H) 05/22/2020 1948   HCO3 25.6 05/22/2020 1948   TCO2 27 05/22/2020 1948   ACIDBASEDEF 2.0 05/22/2020 1948   O2SAT 70.7 05/23/2020 1244    Coagulation Profile: Recent Labs  Lab 05/22/20 1815  INR 1.8*   Cardiac Enzymes: No results for input(s): CKTOTAL, CKMB, CKMBINDEX, TROPONINI in the last 168 hours. HbA1C: Hgb A1c MFr Bld  Date/Time Value Ref Range Status  05/22/2020 06:15 PM 5.5 4.8 - 5.6 % Final    Comment:    (NOTE) Pre diabetes:          5.7%-6.4%  Diabetes:              >6.4%  Glycemic control for   <7.0% adults with diabetes    CBG: Recent Labs  Lab 05/23/20 1627 05/23/20 1958 05/23/20 2350 05/24/20 0411 05/24/20 0824  GLUCAP 115* 153* 142* 142* 124*    The patient  is critically ill with multiple organ systems failure and requires high complexity decision making for assessment and support, frequent evaluation and titration of therapies, application of advanced monitoring technologies and extensive interpretation of multiple databases.  Independent Critical Care Time: 45 Minutes.   Mechele Collin, M.D. [facility] Pulmonary/Critical Care Medicine 05/24/2020 11:25 AM   Please see Amion for pager number to reach on-call Pulmonary and Critical Care Team.

## 2020-05-24 NOTE — Progress Notes (Signed)
Both daughters were updated on the phone following the IR procedure about patient status. All questions were answered at this time.

## 2020-05-24 NOTE — Progress Notes (Signed)
RN and RT were in the room assessing the patient for possible extubation. Patient is no longer alert, following commands, and GCS is now 3.   Patient suddenly woke up, became agitated and was trying to self-extubate. Patient's HR became tachycardic as high as 200s and hypotensive. Rhythm changes were seen on the monitor and an ECG was completed. ECG showed A fib with RVR. Dr. Everardo All was made aware of ECG changes, orders for amio bolus and drip were given.

## 2020-05-25 ENCOUNTER — Encounter (HOSPITAL_COMMUNITY): Payer: Self-pay | Admitting: Internal Medicine

## 2020-05-25 DIAGNOSIS — I2609 Other pulmonary embolism with acute cor pulmonale: Secondary | ICD-10-CM | POA: Diagnosis not present

## 2020-05-25 DIAGNOSIS — U071 COVID-19: Secondary | ICD-10-CM | POA: Diagnosis not present

## 2020-05-25 LAB — POCT I-STAT 7, (LYTES, BLD GAS, ICA,H+H)
Acid-Base Excess: 4 mmol/L — ABNORMAL HIGH (ref 0.0–2.0)
Bicarbonate: 29.2 mmol/L — ABNORMAL HIGH (ref 20.0–28.0)
Calcium, Ion: 1.27 mmol/L (ref 1.15–1.40)
HCT: 28 % — ABNORMAL LOW (ref 39.0–52.0)
Hemoglobin: 9.5 g/dL — ABNORMAL LOW (ref 13.0–17.0)
O2 Saturation: 99 %
Potassium: 4.4 mmol/L (ref 3.5–5.1)
Sodium: 142 mmol/L (ref 135–145)
TCO2: 31 mmol/L (ref 22–32)
pCO2 arterial: 45.4 mmHg (ref 32.0–48.0)
pH, Arterial: 7.417 (ref 7.350–7.450)
pO2, Arterial: 164 mmHg — ABNORMAL HIGH (ref 83.0–108.0)

## 2020-05-25 LAB — CBC WITH DIFFERENTIAL/PLATELET
Abs Immature Granulocytes: 0.09 10*3/uL — ABNORMAL HIGH (ref 0.00–0.07)
Basophils Absolute: 0 10*3/uL (ref 0.0–0.1)
Basophils Relative: 0 %
Eosinophils Absolute: 0 10*3/uL (ref 0.0–0.5)
Eosinophils Relative: 0 %
HCT: 29.8 % — ABNORMAL LOW (ref 39.0–52.0)
Hemoglobin: 9.3 g/dL — ABNORMAL LOW (ref 13.0–17.0)
Immature Granulocytes: 1 %
Lymphocytes Relative: 11 %
Lymphs Abs: 1.3 10*3/uL (ref 0.7–4.0)
MCH: 28.3 pg (ref 26.0–34.0)
MCHC: 31.2 g/dL (ref 30.0–36.0)
MCV: 90.6 fL (ref 80.0–100.0)
Monocytes Absolute: 1.3 10*3/uL — ABNORMAL HIGH (ref 0.1–1.0)
Monocytes Relative: 11 %
Neutro Abs: 9.6 10*3/uL — ABNORMAL HIGH (ref 1.7–7.7)
Neutrophils Relative %: 77 %
Platelets: 270 10*3/uL (ref 150–400)
RBC: 3.29 MIL/uL — ABNORMAL LOW (ref 4.22–5.81)
RDW: 14.6 % (ref 11.5–15.5)
WBC: 12.3 10*3/uL — ABNORMAL HIGH (ref 4.0–10.5)
nRBC: 0.4 % — ABNORMAL HIGH (ref 0.0–0.2)

## 2020-05-25 LAB — GLUCOSE, CAPILLARY
Glucose-Capillary: 103 mg/dL — ABNORMAL HIGH (ref 70–99)
Glucose-Capillary: 119 mg/dL — ABNORMAL HIGH (ref 70–99)
Glucose-Capillary: 137 mg/dL — ABNORMAL HIGH (ref 70–99)
Glucose-Capillary: 162 mg/dL — ABNORMAL HIGH (ref 70–99)
Glucose-Capillary: 163 mg/dL — ABNORMAL HIGH (ref 70–99)
Glucose-Capillary: 86 mg/dL (ref 70–99)

## 2020-05-25 LAB — COMPREHENSIVE METABOLIC PANEL
ALT: 71 U/L — ABNORMAL HIGH (ref 0–44)
AST: 33 U/L (ref 15–41)
Albumin: 2.2 g/dL — ABNORMAL LOW (ref 3.5–5.0)
Alkaline Phosphatase: 114 U/L (ref 38–126)
Anion gap: 7 (ref 5–15)
BUN: 41 mg/dL — ABNORMAL HIGH (ref 8–23)
CO2: 26 mmol/L (ref 22–32)
Calcium: 8.5 mg/dL — ABNORMAL LOW (ref 8.9–10.3)
Chloride: 110 mmol/L (ref 98–111)
Creatinine, Ser: 1.31 mg/dL — ABNORMAL HIGH (ref 0.61–1.24)
GFR, Estimated: 59 mL/min — ABNORMAL LOW (ref >60)
Glucose, Bld: 121 mg/dL — ABNORMAL HIGH (ref 70–99)
Potassium: 4.5 mmol/L (ref 3.5–5.1)
Sodium: 143 mmol/L (ref 135–145)
Total Bilirubin: 0.6 mg/dL (ref 0.3–1.2)
Total Protein: 5 g/dL — ABNORMAL LOW (ref 6.5–8.1)

## 2020-05-25 LAB — PHOSPHORUS: Phosphorus: 1.8 mg/dL — ABNORMAL LOW (ref 2.5–4.6)

## 2020-05-25 LAB — D-DIMER, QUANTITATIVE: D-Dimer, Quant: 14.48 ug/mL-FEU — ABNORMAL HIGH (ref 0.00–0.50)

## 2020-05-25 LAB — HEPARIN LEVEL (UNFRACTIONATED)
Heparin Unfractionated: 0.26 IU/mL — ABNORMAL LOW (ref 0.30–0.70)
Heparin Unfractionated: 0.23 IU/mL — ABNORMAL LOW (ref 0.30–0.70)

## 2020-05-25 LAB — MAGNESIUM: Magnesium: 2.3 mg/dL (ref 1.7–2.4)

## 2020-05-25 LAB — FERRITIN: Ferritin: 432 ng/mL — ABNORMAL HIGH (ref 24–336)

## 2020-05-25 LAB — C-REACTIVE PROTEIN: CRP: 13.4 mg/dL — ABNORMAL HIGH (ref <1.0)

## 2020-05-25 MED ORDER — ADULT MULTIVITAMIN W/MINERALS CH
1.0000 | ORAL_TABLET | Freq: Every day | ORAL | Status: DC
  Administered 2020-05-25: 1
  Filled 2020-05-25: qty 1

## 2020-05-25 MED ORDER — PANTOPRAZOLE SODIUM 40 MG PO PACK
40.0000 mg | PACK | Freq: Every day | ORAL | Status: DC
  Administered 2020-05-25: 40 mg
  Filled 2020-05-25: qty 20

## 2020-05-25 MED ORDER — FREE WATER
200.0000 mL | Status: DC
  Administered 2020-05-25 – 2020-05-26 (×4): 200 mL

## 2020-05-25 MED ORDER — PROSOURCE TF PO LIQD
45.0000 mL | Freq: Two times a day (BID) | ORAL | Status: DC
  Administered 2020-05-25 (×2): 45 mL
  Filled 2020-05-25 (×2): qty 45

## 2020-05-25 MED ORDER — VITAL HIGH PROTEIN PO LIQD: 1000.0000 mL | ORAL | Status: DC

## 2020-05-25 MED ORDER — POTASSIUM PHOSPHATES 15 MMOLE/5ML IV SOLN
20.0000 mmol | Freq: Once | INTRAVENOUS | Status: AC
  Administered 2020-05-25: 20 mmol via INTRAVENOUS
  Filled 2020-05-25: qty 6.67

## 2020-05-25 MED ORDER — VITAL 1.5 CAL PO LIQD
1000.0000 mL | ORAL | Status: DC
  Administered 2020-05-25 – 2020-05-27 (×2): 1000 mL
  Filled 2020-05-25 (×2): qty 1000

## 2020-05-25 MED ORDER — PHENYLEPHRINE HCL-NACL 10-0.9 MG/250ML-% IV SOLN
INTRAVENOUS | Status: AC
  Filled 2020-05-25: qty 250

## 2020-05-25 MED FILL — Medication: Qty: 1 | Status: AC

## 2020-05-25 NOTE — Progress Notes (Signed)
OT Cancellation Note  Patient Details Name: [name] MRN: [id number] DOB: 01/23/1951   Cancelled Treatment:    Reason Eval/Treat Not Completed: Patient not medically ready  Burnett Corrente Louvinia Cumbo, OT/L   Acute OT Clinical Specialist Acute Rehabilitation Services Pager [phone] Office [phone]  05/25/2020, 8:29 AM

## 2020-05-25 NOTE — Progress Notes (Signed)
Video call with family complete 

## 2020-05-25 NOTE — Progress Notes (Signed)
ANTICOAGULATION CONSULT NOTE  Pharmacy Consult for Heparin Indication: pulmonary embolus and new left femoral DVT  No Known Allergies  Patient Measurements: Height: 6' (182.9 cm) Weight: 64.5 kg (142 lb 3.2 oz) IBW/kg (Calculated) : 77.6  Heparin Dosing Weight: 64.5 kg  Vital Signs: Temp: 98.9 F (37.2 C) (11/22 1200) Temp Source: Axillary (11/22 1200) BP: 112/77 (11/22 1300) Pulse Rate: 60 (11/22 1345)  Labs: Recent Labs    05/22/20 1815 05/22/20 1948 05/22/20 2236 05/23/20 0417 05/23/20 0418 05/23/20 1244 05/23/20 1835 05/24/20 0416 05/24/20 0416 05/24/20 1015 05/25/20 0500 05/25/20 0506 05/25/20 1531  HGB 10.4*   < >  --    < > 11.8*  --   --  10.3*   < >  --  9.3* 9.5*  --   HCT 32.9*   < >  --    < > 35.7*  --   --  32.0*  --   --  29.8* 28.0*  --   PLT 423*   < >  --   --  436*  --   --  361  --   --  270  --   --   APTT 50*  --   --   --   --   --   --   --   --   --   --   --   --   LABPROT 20.4*  --   --   --   --   --   --   --   --   --   --   --   --   INR 1.8*  --   --   --   --   --   --   --   --   --   --   --   --   HEPARINUNFRC  --   --   --    < >  --  0.35   < > 0.38   < > 0.50 0.26*  --  0.23*  CREATININE 1.73*   < > 1.73*   < > 2.15* 2.08*  --  1.62*  --   --  1.31*  --   --   TROPONINIHS 364*  --  2,480*  --  1,792*  --   --   --   --   --   --   --   --    < > = values in this interval not displayed.    Estimated Creatinine Clearance: 49.2 mL/min (A) (by C-G formula based on SCr of 1.31 mg/dL (H)).  Assessment: 69 y.o. male admitted on 05/21/2020 with N/V/weakness. Found to have PE after CTA obtained due to elevated d-dimer. Patient went into PEA arrest and received tenecteplase due to known PE at 1827 on 05/22/2020. Duplex revealed new left femoral DVT. Patient is now S/P IVC placement 11/21.   Pharmacy was consulted to dose heparin. Heparin level ~7 hrs after heparin infusion was increased to 1200 units/hr was 0.23 units/ml, which remains  below the goal range for this pt. H/H 9.6/28.0, platelets 270. Per RN, no issues with IV; pt with minimal bleeding in ET tube.  Goal of Therapy:  Heparin level 0.3 -0.7 units/mL, now that > 24 hrs since tenecteplase Monitor platelets by anticoagulation protocol: Yes   Plan:  Increase heparin infusion to 1350 units/hr Check heparin level in 6 hrs Monitor daily heparin level, CBC Monitor for signs/symptoms of bleeding  Diane  Geraldo Pitter, PharmD, BCPS, [facility] Clinical Pharmacist 05/25/2020 4:18 PM

## 2020-05-25 NOTE — Progress Notes (Addendum)
NAME:  VENUS RUHE, MRN:  [id number], DOB:  [birth year], LOS: 4 ADMISSION DATE:  05/20/2020, CONSULTATION DATE:  05/22/20 REFERRING MD:  Elgergawy  CHIEF COMPLAINT:  Cardiac Arrest   Brief History   Dwayne Barnes is a 69 y.o. male who was admitted 11/18 with N/V, weakness.  Workup in ED was notable for pyuria and positive COVID test. He later had CTA due to markedly elevated D-dimer which demonstrated large LLL PE.  He was started on heparin gtt along with steroids and remdesivir.  Unfortunately late afternoon 11/19, he had a cardiac arrest.  He was intubated during the code.  Total downtime of 2-6 minutes before ROSC. Soon after arrival in the ICU he coded again where 4 rounds of CPR were provided with ROSC.   Past Medical History  COVID 19  Acute PE   Significant Hospital Events   11/19 PEA cardiac arrest 11/19 Intubated 11/21 Off Epi. Awake / alert & extubation was considered however became acutely agitated and confused.  11/22 Remains on vent  Consults:  PCCM  Procedures:  L IJ TLC 11/19 >>  ETT 11/19 >>  L Fem A-Line 11/19 >>   Significant Diagnostic Tests:  CT renal stone 11/18 >> non specific colitis, GGO's, left nephrolithiasis without obstructive uropathy. CTA chest 11/18 >> large PC in LLL, GGO's LLL.  Micro Data:  Urine 11/18 >> insignificant growth  COVID 11/18 >> positive Flu 11/18 >> negative  Antimicrobials:  Ceftriaxone x 1 in ED 11/18.   Interim history/subjective:  Pt remains on levophed, vasopressin  Vent 30%, PEEP 5  Glucose range 86-121  On heparin gtt  I/O 1.7L UOP, +121ml in last 24 hours   Objective:  Blood pressure (!) 141/70, pulse 61, temperature 99.7 F (37.6 C), temperature source Oral, resp. rate 16, height 6' (1.829 m), weight 64.5 kg, SpO2 100 %.    Vent Mode: PRVC FiO2 (%):  [30 %-100 %] 30 % Set Rate:  [16 bmp] 16 bmp Vt Set:  [620 mL] 620 mL PEEP:  [5 cmH20-8 cmH20] 5 cmH20 Plateau Pressure:  [14 cmH20-20 cmH20] 14 cmH20    Intake/Output Summary (Last 24 hours) at 05/25/2020 1048 Last data filed at 05/25/2020 1000 Gross per 24 hour  Intake 1936.02 ml  Output 1465 ml  Net 471.02 ml   Filed Weights   05/21/20 1204 05/23/20 0407 05/24/20 0500  Weight: 65.8 kg 64.6 kg 64.5 kg    Examination: General: adult male lying in bed on vent in NAD   HEENT: MM pink/moist, ETT, eyes open / anicteric  Neuro: sedate CV: s1s2 RRR, no m/r/g PULM: non-labored on vent, lungs bilaterally diminished but clear  GI: soft, bsx4 active  Extremities: warm/dry, no edema  Skin: no rashes or lesions  Assessment & Plan:   Obstructive shock secondary to LL PE with subsequent RV failure S/p PEA arrest S/p tenecteplase on 11/19 @1827   -wean vasopressors for MAP >65  -ICU monitoring  -prior discussion with Dr. of HF/Cardiology. No indication for impella support   LL PE  Incidental finding on admission.  Received tPA during code.  Of note, there is reportedly a family hx of Factor V Leiden Deficiency however daughter declined this history -continue heparin gtt per pharmacy   Acute DVT in left femoral S/P [place] filter placement 11/21  -IR consulted for IVC filter placement given risk of additional clot burden on top of poor RV function, appreciate assistance with patient care  Paroxysmal AF -tele monitoring  -  amiodarone gtt -continue heparin  Acute hypoxic respiratory failure  S/p intubation during code. -PRVC, low Vt ventilation  -wean PEEP / fiO2 for sats >85% -follow intermittent CXR -RASS goal 0 to -1, minimize sedation as able  AKI  -Trend BMP / urinary output -Replace electrolytes as indicated -Avoid nephrotoxic agents, ensure adequate renal perfusion  COVID PNA. Completed remdesivir 11/22  -continue prednisone 50 mg QD   Colitis. -supportive care   At Risk Malnutrition  -resume TF   Best Practice:  Diet: NPO. Pain/Anxiety/Delirium protocol (if indicated): PAD protocol ordered VAP  protocol (if indicated): In place. DVT prophylaxis: Heparin gtt. GI prophylaxis: PPI. Glucose control: SSI if glucose consistently > 180. Mobility: Bedrest. Code Status: Full Code Family Communication: Updated daughter Guerrini 11/22 via phone. Daughter reports he is very hard of hearing and wears glasses.  Encouraged her to bring those to the hospital.  Disposition: ICU.  Labs    CBC: Recent Labs  Lab 05/22/20 0239 05/22/20 0239 05/22/20 1815 05/22/20 1815 05/22/20 1948 05/23/20 0418 05/24/20 0416 05/25/20 0500 05/25/20 0506  WBC 12.9*  --  26.4*  --   --  29.8* 24.1* 12.3*  --   NEUTROABS 11.6*  --  24.6*  --   --  25.8* 21.0* 9.6*  --   HGB 12.0*   < > 10.4*   < > 11.2* 11.8* 10.3* 9.3* 9.5*  HCT 36.6*   < > 32.9*   < > 33.0* 35.7* 32.0* 29.8* 28.0*  MCV 89.3  --  92.2  --   --  90.4 90.1 90.6  --   PLT 395  --  423*  --   --  436* 361 270  --    < > = values in this interval not displayed.   Basic Metabolic Panel: Recent Labs  Lab 05/22/20 0239 05/22/20 1815 05/22/20 2236 05/22/20 2236 05/23/20 0418 05/23/20 1244 05/24/20 0416 05/25/20 0500 05/25/20 0506  NA 138   < > 137   < > 139 141 141 143 142  K 4.3   < > 3.3*   < > 3.5 4.2 4.4 4.5 4.4  CL 108   < > 103  --  105 105 109 110  --   CO2 23   < > 20*  --  20* 23 23 26   --   GLUCOSE 137*   < > 404*  --  351* 160* 163* 121*  --   BUN 21   < > 32*  --  37* 43* 44* 41*  --   CREATININE 0.75   < > 1.73*  --  2.15* 2.08* 1.62* 1.31*  --   CALCIUM 8.8*   < > 8.2*  --  8.4* 8.3* 8.4* 8.5*  --   MG 2.1  --   --   --  2.4  --  2.3 2.3  --   PHOS 3.2  --   --   --  6.2*  --  2.9 1.8*  --    < > = values in this interval not displayed.   GFR: Estimated Creatinine Clearance: 49.2 mL/min (A) (by C-G formula based on SCr of 1.31 mg/dL (H)). Recent Labs  Lab 05/21/20 0012 05/21/20 1211 05/22/20 0239 05/22/20 1815 05/22/20 1818 05/23/20 0418 05/24/20 0416 05/25/20 0500  PROCALCITON  --  <0.10  --   --   --   --    --   --   WBC  --   --    < >  26.4*  --  29.8* 24.1* 12.3*  LATICACIDVEN 1.1  --   --   --  9.6*  --   --   --    < > = values in this interval not displayed.   Liver Function Tests: Recent Labs  Lab 05/20/20 1951 05/22/20 0239 05/23/20 0418 05/24/20 0416 05/25/20 0500  AST 21 27 130* 57* 33  ALT 18 23 140* 101* 71*  ALKPHOS 64 70 192* 135* 114  BILITOT 1.9* 0.7 0.7 0.8 0.6  PROT 7.3 5.9* 5.8* 4.9* 5.0*  ALBUMIN 3.1* 2.5* 2.4* 2.2* 2.2*   Recent Labs  Lab 05/20/20 1951  LIPASE 31   No results for input(s): AMMONIA in the last 168 hours. ABG    Component Value Date/Time   PHART 7.417 05/25/2020 0506   PCO2ART 45.4 05/25/2020 0506   PO2ART 164 (H) 05/25/2020 0506   HCO3 29.2 (H) 05/25/2020 0506   TCO2 31 05/25/2020 0506   ACIDBASEDEF 2.0 05/22/2020 1948   O2SAT 99.0 05/25/2020 0506    Coagulation Profile: Recent Labs  Lab 05/22/20 1815  INR 1.8*   Cardiac Enzymes: No results for input(s): CKTOTAL, CKMB, CKMBINDEX, TROPONINI in the last 168 hours.  HbA1C: Hgb A1c MFr Bld  Date/Time Value Ref Range Status  05/22/2020 06:15 PM 5.5 4.8 - 5.6 % Final    Comment:    (NOTE) Pre diabetes:          5.7%-6.4%  Diabetes:              >6.4%  Glycemic control for   <7.0% adults with diabetes    CBG: Recent Labs  Lab 05/24/20 1735 05/24/20 1953 05/24/20 2339 05/25/20 0328 05/25/20 0807  GLUCAP 119* 132* 108* 103* 86    Critical Care Time: 35 minutes   Canary BrimBrandi Ollis, MSN, NP-C, AGACNP-BC [facility] 05/25/2020, 11:13 AM   Please see [email or web address] for pager details.     Pulmonary critical care attending:  This is a 69 year old male admitted found to be Covid positive with a large left lower lobe PE.  Patient was started on heparin steroids remdesivir.  Patient suffered cardiac arrest.  With approximately 4 rounds of CPR.  Remains on Levophed and vasopressin.  At this time still on anticoagulation.  He was given tenecteplase on  05/22/2020.  Patient remains in the ICU critically ill mechanical life support.  BP 112/77   Pulse 60   Temp 98.9 F (37.2 C) (Axillary)   Resp 16   Ht 6' (1.829 m)   Wt 64.5 kg   SpO2 99%   BMI 19.29 kg/m   General: Elderly male intubated mechanical life support Heart: Regular rhythm S1-S2 Lungs: Biomechanically ventilated breath sound Abdomen: Soft nontender nondistended Meds: No significant edema.  Labs: Reviewed  Assessment: Obstructive shock, massive pulmonary embolism with subsequent right heart failure status post TPA during PEA cardiac arrest. Left lower extremity DVT, status post filter placement  PAF Acute hypoxemic respiratory failure secondary above Shock secondary to above COVID-19 pneumonia status post remdesivir course completed, continue on prednisone  Plan: Continue to wean vasopressors as tolerated. Continue to wean FiO2 and PEEP as tolerated, these are down to possible extubate upon numbers at this time. Once sedation lightened can consider SBT SAT as tolerated. Continue IV heparin post tenecteplase.  This patient is critically ill with multiple organ system failure; which, requires frequent high complexity decision making, assessment, support, evaluation, and titration of therapies. This was completed through the application of advanced  monitoring technologies and extensive interpretation of multiple databases. During this encounter critical care time was devoted to patient care services described in this note for 32 minutes.  Josephine Igo, DO [place] [facility] Critical [facility] 05/25/2020 4:53 PM

## 2020-05-25 NOTE — Progress Notes (Signed)
ANTICOAGULATION CONSULT NOTE  Pharmacy Consult for Heparin Indication: pulmonary embolus and new left femoral DVT  No Known Allergies  Patient Measurements: Height: 6' (182.9 cm) Weight: 64.5 kg (142 lb 3.2 oz) IBW/kg (Calculated) : 77.6  Vital Signs: Temp: 99.7 F (37.6 C) (11/22 0330) Temp Source: Oral (11/22 0330) BP: 141/70 (11/22 0410) Pulse Rate: 63 (11/22 0410)  Labs: Recent Labs    05/22/20 1815 05/22/20 1948 05/22/20 2236 05/23/20 0417 05/23/20 0418 05/23/20 1244 05/23/20 1835 05/24/20 0416 05/24/20 0416 05/24/20 1015 05/25/20 0500 05/25/20 0506  HGB 10.4*   < >  --    < > 11.8*  --   --  10.3*   < >  --  9.3* 9.5*  HCT 32.9*   < >  --    < > 35.7*  --   --  32.0*  --   --  29.8* 28.0*  PLT 423*   < >  --   --  436*  --   --  361  --   --  270  --   APTT 50*  --   --   --   --   --   --   --   --   --   --   --   LABPROT 20.4*  --   --   --   --   --   --   --   --   --   --   --   INR 1.8*  --   --   --   --   --   --   --   --   --   --   --   HEPARINUNFRC  --   --   --    < >  --  0.35   < > 0.38  --  0.50 0.26*  --   CREATININE 1.73*   < > 1.73*   < > 2.15* 2.08*  --  1.62*  --   --  1.31*  --   TROPONINIHS 364*  --  2,480*  --  1,792*  --   --   --   --   --   --   --    < > = values in this interval not displayed.    Estimated Creatinine Clearance: 49.2 mL/min (A) (by C-G formula based on SCr of 1.31 mg/dL (H)).  Assessment: 69 y.o. male admitted on 05/21/2020 with N/V/weakness. Found to have PE after CTA obtained due to elevated d-dimer. Patient went into PEA arrest and received tenecteplase due to known PE at 1827 on 05/22/2020. Duplex reveals new left femoral DVT. Patient is now s/p IVC placement 11/21.   Pharmacy consulted to dose heparin. Heparin level of 0.26 is subtherapeutic on 1050 units/hr. Per RN, no issues with gtt or signs of bleeding.   Goal of Therapy:  Heparin level 0.3 -0.7 units/mL now that > 24 hrs since tenecteplase Monitor  platelets by anticoagulation protocol: Yes   Plan:  Increase heparin 1200 units/hr 6h HL Monitor heparin level, CBC and S/S of bleeding daily   Kinnie Feil, PharmD PGY1 Acute Care Pharmacy Resident 05/25/2020 7:30 AM  Please check [email or web address] for unit specific pharmacy phone numbers.

## 2020-05-25 NOTE — Progress Notes (Signed)
Nutrition Follow-up  DOCUMENTATION CODES:   Not applicable  INTERVENTION:   Tube Feeding via OG:  Vital 1.5 at 55 ml/hr Pro-Source 45 mL BID Provides 111 g of protein, 2060 kcals and 1003 mL of free water Meets 100% estimated calorie and protein needs  NUTRITION DIAGNOSIS:   Increased nutrient needs related to acute illness (COVID-19) as evidenced by estimated needs.  Being addressed via TF   GOAL:   Patient will meet greater than or equal to 90% of their needs  Progressing  MONITOR:   PO intake, Supplement acceptance, Labs, Weight trends, Skin, I & O's  REASON FOR ASSESSMENT:   Malnutrition Screening Tool    ASSESSMENT:   69 yo male with no known PMH presents with N/V, weakness and back and admitted with COVID 19 pneumonia on 11/18, had PEA arrest on 11/19 and required intubation.  11/18 Admitted 11/19 PEA arrest, Intubated  Pt currently on vent support, sedated with fentanyl and precedex, requiring levophed and vasopressin  OG tube in distal stomach per abd xray on 11/20  Recorded po intake 70-100% prior to intubation  Current wt 64.5 kg; admit weight 66 kg  Labs: Creatinine 1.31, BUN 41, sodium 142 (wdl), potassium 4.4 (wdl), phosphorus 1.8 (L) Meds: ss novolog, MVI with Minerals, miralax, potassium phosphate, prednisone,    Diet Order:   Diet Order            Diet NPO time specified  Diet effective now                 EDUCATION NEEDS:   No education needs have been identified at this time  Skin:  Skin Assessment: Reviewed RN Assessment  Last BM:  11/19  Height:   Ht Readings from Last 1 Encounters:  05/22/20 6' (1.829 m)    Weight:   Wt Readings from Last 1 Encounters:  05/24/20 64.5 kg    Ideal Body Weight:  80.9 kg  BMI:  Body mass index is 19.29 kg/m.  Estimated Nutritional Needs:   Kcal:  8185-6314 kcals  Protein:  95-130 g  Fluid:  > 2 L  Romelle Starcher MS, RDN, LDN, CNSC Registered Dietitian III Clinical  Nutrition RD Pager and On-Call Pager Number Located in [place]

## 2020-05-25 NOTE — Progress Notes (Signed)
PT Cancellation Note  Patient Details Name: Dwayne Barnes MRN: [id number] DOB: 04/24/1951   Cancelled Treatment:    Reason Eval/Treat Not Completed: Patient not medically ready (pt in IR this am and per RN hold today for mobility as hope to extubate. Will check on next date)   Cristine Polio 05/25/2020, 8:38 AM  Merryl Hacker, PT Acute Rehabilitation Services Pager: [phone] Office: [phone]

## 2020-05-26 ENCOUNTER — Inpatient Hospital Stay (HOSPITAL_COMMUNITY): Payer: Medicare HMO

## 2020-05-26 DIAGNOSIS — U071 COVID-19: Secondary | ICD-10-CM | POA: Diagnosis not present

## 2020-05-26 DIAGNOSIS — I2609 Other pulmonary embolism with acute cor pulmonale: Secondary | ICD-10-CM | POA: Diagnosis not present

## 2020-05-26 LAB — CBC WITH DIFFERENTIAL/PLATELET
Abs Immature Granulocytes: 0.09 10*3/uL — ABNORMAL HIGH (ref 0.00–0.07)
Basophils Absolute: 0 10*3/uL (ref 0.0–0.1)
Basophils Relative: 0 %
Eosinophils Absolute: 0 10*3/uL (ref 0.0–0.5)
Eosinophils Relative: 0 %
HCT: 28.6 % — ABNORMAL LOW (ref 39.0–52.0)
Hemoglobin: 9.1 g/dL — ABNORMAL LOW (ref 13.0–17.0)
Immature Granulocytes: 1 %
Lymphocytes Relative: 4 %
Lymphs Abs: 0.5 10*3/uL — ABNORMAL LOW (ref 0.7–4.0)
MCH: 29.4 pg (ref 26.0–34.0)
MCHC: 31.8 g/dL (ref 30.0–36.0)
MCV: 92.6 fL (ref 80.0–100.0)
Monocytes Absolute: 1.6 10*3/uL — ABNORMAL HIGH (ref 0.1–1.0)
Monocytes Relative: 11 %
Neutro Abs: 12.4 10*3/uL — ABNORMAL HIGH (ref 1.7–7.7)
Neutrophils Relative %: 84 %
Platelets: 257 10*3/uL (ref 150–400)
RBC: 3.09 MIL/uL — ABNORMAL LOW (ref 4.22–5.81)
RDW: 14.6 % (ref 11.5–15.5)
WBC: 14.6 10*3/uL — ABNORMAL HIGH (ref 4.0–10.5)
nRBC: 0 % (ref 0.0–0.2)

## 2020-05-26 LAB — COMPREHENSIVE METABOLIC PANEL
ALT: 57 U/L — ABNORMAL HIGH (ref 0–44)
AST: 39 U/L (ref 15–41)
Albumin: 2.2 g/dL — ABNORMAL LOW (ref 3.5–5.0)
Alkaline Phosphatase: 103 U/L (ref 38–126)
Anion gap: 9 (ref 5–15)
BUN: 36 mg/dL — ABNORMAL HIGH (ref 8–23)
CO2: 26 mmol/L (ref 22–32)
Calcium: 8.7 mg/dL — ABNORMAL LOW (ref 8.9–10.3)
Chloride: 105 mmol/L (ref 98–111)
Creatinine, Ser: 1.2 mg/dL (ref 0.61–1.24)
GFR, Estimated: >60 mL/min (ref >60)
Glucose, Bld: 149 mg/dL — ABNORMAL HIGH (ref 70–99)
Potassium: 3.9 mmol/L (ref 3.5–5.1)
Sodium: 140 mmol/L (ref 135–145)
Total Bilirubin: 0.8 mg/dL (ref 0.3–1.2)
Total Protein: 5.1 g/dL — ABNORMAL LOW (ref 6.5–8.1)

## 2020-05-26 LAB — GLUCOSE, CAPILLARY
Glucose-Capillary: 117 mg/dL — ABNORMAL HIGH (ref 70–99)
Glucose-Capillary: 122 mg/dL — ABNORMAL HIGH (ref 70–99)
Glucose-Capillary: 132 mg/dL — ABNORMAL HIGH (ref 70–99)
Glucose-Capillary: 136 mg/dL — ABNORMAL HIGH (ref 70–99)
Glucose-Capillary: 145 mg/dL — ABNORMAL HIGH (ref 70–99)
Glucose-Capillary: 176 mg/dL — ABNORMAL HIGH (ref 70–99)

## 2020-05-26 LAB — C-REACTIVE PROTEIN: CRP: 19.6 mg/dL — ABNORMAL HIGH (ref <1.0)

## 2020-05-26 LAB — HEPARIN LEVEL (UNFRACTIONATED)
Heparin Unfractionated: 0.33 IU/mL (ref 0.30–0.70)
Heparin Unfractionated: 0.36 IU/mL (ref 0.30–0.70)

## 2020-05-26 LAB — D-DIMER, QUANTITATIVE: D-Dimer, Quant: 6.37 ug/mL-FEU — ABNORMAL HIGH (ref 0.00–0.50)

## 2020-05-26 LAB — MAGNESIUM: Magnesium: 2.3 mg/dL (ref 1.7–2.4)

## 2020-05-26 LAB — FERRITIN: Ferritin: 388 ng/mL — ABNORMAL HIGH (ref 24–336)

## 2020-05-26 LAB — PHOSPHORUS: Phosphorus: 3 mg/dL (ref 2.5–4.6)

## 2020-05-26 MED ORDER — HALOPERIDOL LACTATE 5 MG/ML IJ SOLN
2.0000 mg | Freq: Once | INTRAMUSCULAR | Status: AC
  Administered 2020-05-26: 2 mg via INTRAVENOUS

## 2020-05-26 MED ORDER — METHYLPREDNISOLONE SODIUM SUCC 40 MG IJ SOLR
30.0000 mg | Freq: Every day | INTRAMUSCULAR | Status: AC
  Administered 2020-05-27: 30 mg via INTRAVENOUS
  Filled 2020-05-26: qty 1

## 2020-05-26 MED ORDER — SODIUM CHLORIDE 0.9% FLUSH
10.0000 mL | Freq: Two times a day (BID) | INTRAVENOUS | Status: DC
  Administered 2020-05-26 – 2020-06-10 (×25): 10 mL
  Administered 2020-06-11: 20 mL
  Administered 2020-06-11 – 2020-06-25 (×28): 10 mL

## 2020-05-26 MED ORDER — HALOPERIDOL LACTATE 5 MG/ML IJ SOLN
INTRAMUSCULAR | Status: AC
  Filled 2020-05-26: qty 1

## 2020-05-26 MED ORDER — LORAZEPAM 2 MG/ML IJ SOLN
2.0000 mg | Freq: Once | INTRAMUSCULAR | Status: DC
  Filled 2020-05-26: qty 1

## 2020-05-26 MED ORDER — SODIUM CHLORIDE 0.9% FLUSH
10.0000 mL | INTRAVENOUS | Status: DC | PRN
  Administered 2020-05-26: 10 mL

## 2020-05-26 MED ORDER — DEXMEDETOMIDINE HCL IN NACL 400 MCG/100ML IV SOLN
0.2000 ug/kg/h | INTRAVENOUS | Status: DC
  Administered 2020-05-26: 0.3 ug/kg/h via INTRAVENOUS
  Administered 2020-05-26: 1.2 ug/kg/h via INTRAVENOUS
  Filled 2020-05-26 (×2): qty 100

## 2020-05-26 MED ORDER — METHYLPREDNISOLONE SODIUM SUCC 40 MG IJ SOLR
40.0000 mg | Freq: Every day | INTRAMUSCULAR | Status: AC
  Administered 2020-05-26: 40 mg via INTRAVENOUS
  Filled 2020-05-26: qty 1

## 2020-05-26 MED ORDER — METHYLPREDNISOLONE SODIUM SUCC 40 MG IJ SOLR
10.0000 mg | Freq: Every day | INTRAMUSCULAR | Status: AC
  Administered 2020-05-29: 10 mg via INTRAVENOUS
  Filled 2020-05-26: qty 1

## 2020-05-26 MED ORDER — METHYLPREDNISOLONE SODIUM SUCC 40 MG IJ SOLR
20.0000 mg | Freq: Every day | INTRAMUSCULAR | Status: AC
  Administered 2020-05-28: 20 mg via INTRAVENOUS
  Filled 2020-05-26: qty 1

## 2020-05-26 NOTE — Progress Notes (Signed)
ANTICOAGULATION CONSULT NOTE  Pharmacy Consult for Heparin Indication: pulmonary embolus   No Known Allergies  Patient Measurements: Height: 6' (182.9 cm) Weight: 64.5 kg (142 lb 3.2 oz) IBW/kg (Calculated) : 77.6  Vital Signs: Temp: 99 F (37.2 C) (11/23 0344) Temp Source: Axillary (11/23 0344) BP: 100/67 (11/22 2200) Pulse Rate: 61 (11/23 0400)  Labs: Recent Labs    05/23/20 1244 05/23/20 1835 05/24/20 0416 05/24/20 1015 05/25/20 0500 05/25/20 0500 05/25/20 0506 05/25/20 1531 05/26/20 0500  HGB  --    < > 10.3*  --  9.3*   < > 9.5*  --  9.1*  HCT  --    < > 32.0*  --  29.8*  --  28.0*  --  28.6*  PLT  --   --  361  --  270  --   --   --  257  HEPARINUNFRC 0.35   < > 0.38   < > 0.26*  --   --  0.23* 0.33  CREATININE 2.08*  --  1.62*  --  1.31*  --   --   --   --    < > = values in this interval not displayed.    Estimated Creatinine Clearance: 49.2 mL/min (A) (by C-G formula based on SCr of 1.31 mg/dL (H)).  Assessment: 69 y.o. male with PE for heparin  Patient coded earlier this evening - got TNKase aptt now 50 - hep was held during code  No bleeding per rn  11/23 AM update:  Heparin level therapeutic after rate increase  Goal of Therapy:  Heparin level 0.3-0.7 units/ml  Monitor platelets by anticoagulation protocol: Yes   Plan:  Cont heparin 1350 units/hr 1200 heparin level  Abran Duke, PharmD, BCPS Clinical Pharmacist Phone: [phone]

## 2020-05-26 NOTE — Progress Notes (Signed)
PT Cancellation Note  Patient Details Name: Dwayne Barnes MRN: [id number] DOB: Oct 16, 1950   Cancelled Treatment:    Reason Eval/Treat Not Completed: Patient not medically ready (pt remains with fem art line limiting mobility)   [name] [name] 05/26/2020, 7:32 AM  Merryl Hacker, PT Acute Rehabilitation Services Pager: [phone] Office: [phone]

## 2020-05-26 NOTE — Progress Notes (Signed)
ANTICOAGULATION CONSULT NOTE  Pharmacy Consult for Heparin Indication: pulmonary embolus   No Known Allergies  Patient Measurements: Height: 6' (182.9 cm) Weight: 64.5 kg (142 lb 3.2 oz) IBW/kg (Calculated) : 77.6  Vital Signs: Temp: 99.5 F (37.5 C) (11/23 1200) Temp Source: Axillary (11/23 1200) BP: 144/69 (11/23 0750) Pulse Rate: 51 (11/23 1300)  Labs: Recent Labs    05/24/20 0416 05/24/20 1015 05/25/20 0500 05/25/20 0500 05/25/20 0506 05/25/20 1531 05/26/20 0500 05/26/20 1200  HGB 10.3*  --  9.3*   < > 9.5*  --  9.1*  --   HCT 32.0*  --  29.8*  --  28.0*  --  28.6*  --   PLT 361  --  270  --   --   --  257  --   HEPARINUNFRC 0.38   < > 0.26*   < >  --  0.23* 0.33 0.36  CREATININE 1.62*  --  1.31*  --   --   --  1.20  --    < > = values in this interval not displayed.    Estimated Creatinine Clearance: 53.8 mL/min (by C-G formula based on SCr of 1.2 mg/dL).  Assessment: 69 y.o. male with PE s/p TNKase for PEA cardiac arrest now on heparin.  Heparin level continues to be therapeutic at 0.36 on 1350 units/hr. CBC stable, no signs of bleeding per RN.   Goal of Therapy:  Heparin level 0.3-0.7 units/ml  Monitor platelets by anticoagulation protocol: Yes   Plan:  Cont heparin 1350 units/hr Daily HL, CBC, monitor s/s bleeding  Kinnie Feil, PharmD PGY1 Acute Care Pharmacy Resident Phone: [phone] 05/26/2020 2:28 PM  Please check [email or web address] for unit specific pharmacy phone numbers.

## 2020-05-26 NOTE — Progress Notes (Signed)
OT Cancellation Note  Patient Details Name: Dwayne Barnes MRN: [id number] DOB: 1950/10/26   Cancelled Treatment:    Reason Eval/Treat Not Completed: Patient not medically ready. Pt with fem art line, limiting mobility. Will assess when medically ready.  Thornell Mule, OT/L   Acute OT Clinical Specialist Acute Rehabilitation Services Pager [phone] Office [phone]  05/26/2020, 7:39 AM

## 2020-05-26 NOTE — Progress Notes (Addendum)
NAME:  Dwayne Barnes, MRN:  [id number], DOB:  02/24/1951, LOS: 5 ADMISSION DATE:  05/20/2020, CONSULTATION DATE:  05/22/20 REFERRING MD:  Elgergawy  CHIEF COMPLAINT:  Cardiac Arrest   Brief History   Dwayne Barnes is a 69 y.o. male who was admitted 11/18 with N/V, weakness.  Workup in ED was notable for pyuria and positive COVID test. He later had CTA due to markedly elevated D-dimer which demonstrated large LLL PE.  He was started on heparin gtt along with steroids and remdesivir.  Unfortunately late afternoon 11/19, he had a cardiac arrest.  He was intubated during the code.  Total downtime of 2-6 minutes before ROSC. Soon after arrival in the ICU he coded again where 4 rounds of CPR were provided with ROSC.   Past Medical History  COVID 19  Acute PE   Significant Hospital Events   11/19 PEA cardiac arrest 11/19 Intubated 11/21 Off Epi. Awake / alert & extubation was considered however became acutely agitated and confused.  11/22 Remains on vent 11/23 Self extubated  Consults:  PCCM  Procedures:  L IJ TLC 11/19 >>  ETT 11/19 >> 11/23 L Fem A-Line 11/19 >>   Significant Diagnostic Tests:  CT renal stone 11/18 >> non specific colitis, GGO's, left nephrolithiasis without obstructive uropathy. CTA chest 11/18 >> large PC in LLL, GGO's LLL.  Micro Data:  Urine 11/18 >> insignificant growth  COVID 11/18 >> positive Flu 11/18 >> negative  Antimicrobials:  Ceftriaxone x 1 in ED 11/18.   Interim history/subjective:  Pt self extubated overnight. RN reports he was agitated, spitting at staff / attempted to hit staff.  Tmax 99  RN concerned over mental status > pt received haldol overnight for agitation  On 2L  On/off levophed   Objective:  Blood pressure (!) 144/69, pulse (!) 56, temperature 99 F (37.2 C), temperature source Axillary, resp. rate 19, height 6' (1.829 m), weight 64.5 kg, SpO2 100 %.    Vent Mode: PRVC FiO2 (%):  [30 %] 30 % Set Rate:  [16 bmp] 16 bmp Vt  Set:  [620 mL] 620 mL PEEP:  [5 cmH20] 5 cmH20 Plateau Pressure:  [14 cmH20] 14 cmH20   Intake/Output Summary (Last 24 hours) at 05/26/2020 1029 Last data filed at 05/26/2020 [id number] Gross per 24 hour  Intake 2400.34 ml  Output 3145 ml  Net -744.66 ml   Filed Weights   05/23/20 0407 05/24/20 0500 05/26/20 0500  Weight: 64.6 kg 64.5 kg 64.5 kg    Examination: General: thin, frail adult male lying in bed in NAD on [place] O2 HEENT: MM pink/dry, [place] O2, anicteric, HOH Neuro: eyes open, brief nod, very hard of hearing, not following commands  CV: s1s2 rrr, SB on monitor, no m/r/g PULM:  Non-labored, lungs bilaterally clear   GI: soft, bsx4 active  Extremities: warm/dry, no edema  Skin: no rashes or lesions   Assessment & Plan:   Obstructive shock secondary to LL PE with subsequent RV failure S/p PEA arrest S/p tenecteplase on 11/19 @1827   -wean vasopressors for MAP>65  -ICU monitoring  -per prior discussion with Dr. Gala RomneyBensimhon, no indication for impella support   LLL PE  Incidental finding on admission.  Received tPA during code.  Of note, there is reportedly a family hx of Factor V Leiden Deficiency however daughter declined this history -heparin gtt per pharmacy   Acute DVT in left femoral S/P [place] filter placement 11/21  -appreciate IR assistance with IVC filter  -  heparin as above   Paroxysmal AF -tele monitoring  -amiodarone gtt  -heparin   Agitated Delirium  Acute Metabolic Encephalopathy Self extubated 11/23 overnight, agitated / received haldol, now sedate. On heparin / AFwRVR.  Must also consider possible anoxic injury with post arrest.  -assess CT head now -if not waking off sedation, consider MRI in next 24-48 hours -minimize sedating medications  Acute hypoxic respiratory failure  S/p intubation during code. -pulmonary hygiene -IS, mobilize -wean PEEP / FiO2 for sats >85% -follow intermittent CXR   AKI  -Trend BMP / urinary output -Replace electrolytes  as indicated -Avoid nephrotoxic agents, ensure adequate renal perfusion  COVID PNA. Completed remdesivir 11/22  -wean prednisone to off > he has had little pulmonary involvement    Colitis. -supportive care   At Risk Malnutrition  -place cortrak for nutrition   HOH  -family bringing batteries for hearing aides and glasses  Best Practice:  Diet: NPO. Pain/Anxiety/Delirium protocol (if indicated): PAD protocol ordered VAP protocol (if indicated): In place. DVT prophylaxis: Heparin gtt. GI prophylaxis: PPI. Glucose control: SSI if glucose consistently > 180. Mobility: Bedrest. Code Status: Full Code Family Communication: Updated daughter Borg 11/23 via phone. Asked family to discuss thought of reintubation / CPR if he were to fail post self extubation.  Daughter is going to discuss with her sister regarding reintubation / CPR.  Disposition: ICU.  Labs    CBC: Recent Labs  Lab 05/22/20 1815 05/22/20 1948 05/23/20 0418 05/24/20 0416 05/25/20 0500 05/25/20 0506 05/26/20 0500  WBC 26.4*  --  29.8* 24.1* 12.3*  --  14.6*  NEUTROABS 24.6*  --  25.8* 21.0* 9.6*  --  12.4*  HGB 10.4*   < > 11.8* 10.3* 9.3* 9.5* 9.1*  HCT 32.9*   < > 35.7* 32.0* 29.8* 28.0* 28.6*  MCV 92.2  --  90.4 90.1 90.6  --  92.6  PLT 423*  --  436* 361 270  --  257   < > = values in this interval not displayed.   Basic Metabolic Panel: Recent Labs  Lab 05/22/20 0239 05/22/20 1815 05/23/20 0418 05/23/20 0418 05/23/20 1244 05/24/20 0416 05/25/20 0500 05/25/20 0506 05/26/20 0500  NA 138   < > 139   < > 141 141 143 142 140  K 4.3   < > 3.5   < > 4.2 4.4 4.5 4.4 3.9  CL 108   < > 105  --  105 109 110  --  105  CO2 23   < > 20*  --  23 23 26   --  26  GLUCOSE 137*   < > 351*  --  160* 163* 121*  --  149*  BUN 21   < > 37*  --  43* 44* 41*  --  36*  CREATININE 0.75   < > 2.15*  --  2.08* 1.62* 1.31*  --  1.20  CALCIUM 8.8*   < > 8.4*  --  8.3* 8.4* 8.5*  --  8.7*  MG 2.1  --  2.4  --   --   2.3 2.3  --  2.3  PHOS 3.2  --  6.2*  --   --  2.9 1.8*  --  3.0   < > = values in this interval not displayed.   GFR: Estimated Creatinine Clearance: 53.8 mL/min (by C-G formula based on SCr of 1.2 mg/dL). Recent Labs  Lab 05/21/20 0012 05/21/20 1211 05/22/20 0239 05/22/20 1815 05/22/20 1818 05/23/20 05/25/20  05/24/20 0416 05/25/20 0500 05/26/20 0500  PROCALCITON  --  <0.10  --   --   --   --   --   --   --   WBC  --   --    < >   < >  --  29.8* 24.1* 12.3* 14.6*  LATICACIDVEN 1.1  --   --   --  9.6*  --   --   --   --    < > = values in this interval not displayed.   Liver Function Tests: Recent Labs  Lab 05/22/20 0239 05/23/20 0418 05/24/20 0416 05/25/20 0500 05/26/20 0500  AST 27 130* 57* 33 39  ALT 23 140* 101* 71* 57*  ALKPHOS 70 192* 135* 114 103  BILITOT 0.7 0.7 0.8 0.6 0.8  PROT 5.9* 5.8* 4.9* 5.0* 5.1*  ALBUMIN 2.5* 2.4* 2.2* 2.2* 2.2*   Recent Labs  Lab 05/20/20 1951  LIPASE 31   No results for input(s): AMMONIA in the last 168 hours. ABG    Component Value Date/Time   PHART 7.417 05/25/2020 0506   PCO2ART 45.4 05/25/2020 0506   PO2ART 164 (H) 05/25/2020 0506   HCO3 29.2 (H) 05/25/2020 0506   TCO2 31 05/25/2020 0506   ACIDBASEDEF 2.0 05/22/2020 1948   O2SAT 99.0 05/25/2020 0506    Coagulation Profile: Recent Labs  Lab 05/22/20 1815  INR 1.8*   Cardiac Enzymes: No results for input(s): CKTOTAL, CKMB, CKMBINDEX, TROPONINI in the last 168 hours.  HbA1C: Hgb A1c MFr Bld  Date/Time Value Ref Range Status  05/22/2020 06:15 PM 5.5 4.8 - 5.6 % Final    Comment:    (NOTE) Pre diabetes:          5.7%-6.4%  Diabetes:              >6.4%  Glycemic control for   <7.0% adults with diabetes    CBG: Recent Labs  Lab 05/25/20 1509 05/25/20 1953 05/25/20 2346 05/26/20 0341 05/26/20 0813  GLUCAP 163* 137* 162* 176* 117*    Critical Care Time: 34 minutes   Canary Brim, MSN, NP-C, AGACNP-BC [facility] 05/26/2020,  10:29 AM   Please see [email or web address] for pager details.     PCCM:  69 year old male admitted with massive PE status post arrest found to be Covid positive.  Treated with steroids remdesivir.  Patient received tenecteplase during cardiac arrest on 1119.  Was on multiple vasopressors.  Now weaned from this.  Had a filter placed on 05/24/2020 from IVC by IR.  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  He self extubated last night.  Has been stable on nasal cannula O2 supplementation on low-dose Precedex because he was severely agitated.  BP (!) 144/69   Pulse (!) 52   Temp 99.5 F (37.5 C) (Axillary)   Resp 12   Ht 6' (1.829 m)   Wt 64.5 kg   SpO2 98%   BMI 19.29 kg/m   General: Elderly male, responsive to voice easily startled Heart: Regular rate rhythm S1-S2 lungs: Bilateral breath sounds no crackles no wheeze Abdomen: Soft nontender nondistended Extremities: No significant edema  Labs: Reviewed  Assessment: Obstructive shock secondary to acute RV failure from massive PE status post tenecteplase and PEA cardiac arrest. Acute metabolic encephalopathy secondary to above with delirium, complicated by hard of hearing and vision loss. Patient did have an acute change this morning that was concerning and decision made for CT scan of the head which is pending.  PAF Delirium secondary to above COVID-19, pneumonia status post remdesivir plus steroids.  Plan: Family bringing batteries for hearing aids and will put glasses on to help with reorientation Weaning from Precedex to help with agitation. CT of the head to ensure no significant bleed with neurologic changes and the fact that he is on heparin Continue heparin per pharmacy protocol. If remains stable into tomorrow with close observation between now and then can consider transfer from the intensive care unit.Marland Kitchen He will definitely need a core track for enteral access.  This patient is critically ill with multiple organ system failure;  which, requires frequent high complexity decision making, assessment, support, evaluation, and titration of therapies. This was completed through the application of advanced monitoring technologies and extensive interpretation of multiple databases. During this encounter critical care time was devoted to patient care services described in this note for 32 minutes.  Josephine Igo, DO [facility] 05/26/2020 3:13 PM

## 2020-05-26 NOTE — Progress Notes (Signed)
Patient self extubated while in restraints. Respiratory at the bedside placed pt on nasal cannula. Patient maintained sats 100 %. Patient very agitated MD called to bedside, Ordered haldol and ativan for agitation. Pressors on hold for tachycardia and hypertension. Family notified of the situation.

## 2020-05-26 NOTE — Progress Notes (Addendum)
Called to the bedside to evaluate after patient self extubated.   100% sat on HFNC 10L , no respiratory distress.   Intermittent agitation.  Tachycardic (a fib).   Already on amiodarone.  Was in sinus rhythm prior to extubation.  Hypertensive.   Held levophed and vasopressin.  Increased precedex to 1.2, stopped fentanyl.   Given haldol and ativan x 1 for ongoing agitation.   HR improving.   Charlotte Sanes

## 2020-05-27 LAB — COMPREHENSIVE METABOLIC PANEL
ALT: 48 U/L — ABNORMAL HIGH (ref 0–44)
AST: 50 U/L — ABNORMAL HIGH (ref 15–41)
Albumin: 2 g/dL — ABNORMAL LOW (ref 3.5–5.0)
Alkaline Phosphatase: 89 U/L (ref 38–126)
Anion gap: 9 (ref 5–15)
BUN: 24 mg/dL — ABNORMAL HIGH (ref 8–23)
CO2: 27 mmol/L (ref 22–32)
Calcium: 8.6 mg/dL — ABNORMAL LOW (ref 8.9–10.3)
Chloride: 106 mmol/L (ref 98–111)
Creatinine, Ser: 1 mg/dL (ref 0.61–1.24)
GFR, Estimated: >60 mL/min (ref >60)
Glucose, Bld: 137 mg/dL — ABNORMAL HIGH (ref 70–99)
Potassium: 4 mmol/L (ref 3.5–5.1)
Sodium: 142 mmol/L (ref 135–145)
Total Bilirubin: 0.6 mg/dL (ref 0.3–1.2)
Total Protein: 4.8 g/dL — ABNORMAL LOW (ref 6.5–8.1)

## 2020-05-27 LAB — CBC
HCT: 28.5 % — ABNORMAL LOW (ref 39.0–52.0)
Hemoglobin: 9.3 g/dL — ABNORMAL LOW (ref 13.0–17.0)
MCH: 29.6 pg (ref 26.0–34.0)
MCHC: 32.6 g/dL (ref 30.0–36.0)
MCV: 90.8 fL (ref 80.0–100.0)
Platelets: 250 10*3/uL (ref 150–400)
RBC: 3.14 MIL/uL — ABNORMAL LOW (ref 4.22–5.81)
RDW: 14.6 % (ref 11.5–15.5)
WBC: 10.8 10*3/uL — ABNORMAL HIGH (ref 4.0–10.5)
nRBC: 0 % (ref 0.0–0.2)

## 2020-05-27 LAB — GLUCOSE, CAPILLARY
Glucose-Capillary: 106 mg/dL — ABNORMAL HIGH (ref 70–99)
Glucose-Capillary: 109 mg/dL — ABNORMAL HIGH (ref 70–99)
Glucose-Capillary: 115 mg/dL — ABNORMAL HIGH (ref 70–99)
Glucose-Capillary: 118 mg/dL — ABNORMAL HIGH (ref 70–99)
Glucose-Capillary: 127 mg/dL — ABNORMAL HIGH (ref 70–99)
Glucose-Capillary: 128 mg/dL — ABNORMAL HIGH (ref 70–99)

## 2020-05-27 LAB — HEPARIN LEVEL (UNFRACTIONATED): Heparin Unfractionated: 0.34 IU/mL (ref 0.30–0.70)

## 2020-05-27 MED ORDER — VITAL HIGH PROTEIN PO LIQD
1000.0000 mL | ORAL | Status: DC
  Administered 2020-05-27 – 2020-05-28 (×2): 1000 mL
  Filled 2020-05-27: qty 1000

## 2020-05-27 MED ORDER — ENOXAPARIN SODIUM 60 MG/0.6ML ~~LOC~~ SOLN
60.0000 mg | Freq: Two times a day (BID) | SUBCUTANEOUS | Status: DC
  Administered 2020-05-27 – 2020-05-31 (×8): 60 mg via SUBCUTANEOUS
  Filled 2020-05-27 (×8): qty 0.6

## 2020-05-27 MED ORDER — PROSOURCE TF PO LIQD
45.0000 mL | Freq: Two times a day (BID) | ORAL | Status: DC
  Administered 2020-05-27 – 2020-05-29 (×4): 45 mL
  Filled 2020-05-27 (×5): qty 45

## 2020-05-27 NOTE — Evaluation (Signed)
Occupational Therapy Evaluation Patient Details Name: Dwayne Barnes MRN: [id number] DOB: 11/04/1950 Today's Date: 05/27/2020    History of Present Illness 69 yo admitted 11/18 with back pain, weakness and colitis found to be Covid (+) with LLL PE. 11/19 pt with cardiac arrest intubated, left femoral DVT with IVC filter placed 11/22. 11/23 pt self-extubated. 11/24 fem line removed. No significant PMHx   Clinical Impression   Pt admitted with above diagnoses, presenting with generalized weakness and decreased activity tolerance limiting ability to engage in ADL at desired level of independence. Unsure of home set up or PLOF due to pt being extremely HOH with difficulty reporting information. At time of eval, pt able to complete bed mobility with min A +2 and sit <> stands with mod A +2. Pt was then able to stand pivot to [facility] for BM with mod A +2. He required total A for peri hygiene due to needing BUE support to maintain balance. VSS on RA this session. Given current status, recommend SNF at d/c. Although pt may progress to HHOT. Will continue to follow for d/c needs per POC listed below.     Follow Up Recommendations  SNF (may progress to home)    Equipment Recommendations  3 in 1 bedside commode    Recommendations for Other Services       Precautions / Restrictions Precautions Precautions: Fall Restrictions Weight Bearing Restrictions: No      Mobility Bed Mobility Overal bed mobility: Needs Assistance Bed Mobility: Supine to Sit;Sit to Supine     Supine to sit: Min assist;+2 for safety/equipment Sit to supine: Min assist;+2 for safety/equipment   General bed mobility comments: visual cues used to guide pt to EOB; light assist needed to guide BLEs with min support at trunk to attain upright sitting posture. +2 person used for line management and safety    Transfers Overall transfer level: Needs assistance Equipment used: 2 person hand held assist Transfers: Sit to/from  [employer] Sit to Stand: Mod assist;+2 physical assistance;+2 safety/equipment Stand pivot transfers: Mod assist;+2 physical assistance;+2 safety/equipment       General transfer comment: assist to power into standing and take pivotal steps over to [facility]. increased assist mostly due to safety because pt rushing to use [facility] for urgent BM    Balance Overall balance assessment: Needs assistance Sitting-balance support: Feet supported Sitting balance-Leahy Scale: Fair     Standing balance support: Bilateral upper extremity supported;During functional activity Standing balance-Leahy Scale: Poor Standing balance comment: reliant on external support                           ADL either performed or assessed with clinical judgement   ADL Overall ADL's : Needs assistance/impaired Eating/Feeding: NPO Eating/Feeding Details (indicate cue type and reason): [employer]trak Grooming: Set up;Sitting   Upper Body Bathing: Minimal assistance;Sitting   Lower Body Bathing: Moderate assistance;Sit to/from stand;Sitting/lateral leans   Upper Body Dressing : Minimal assistance;Sitting   Lower Body Dressing: Moderate assistance;Sit to/from stand;Sitting/lateral leans   Toilet Transfer: Moderate assistance;+2 for physical assistance;+2 for safety/equipment;BSC Toilet Transfer Details (indicate cue type and reason): mod A +2 to stand pivot over to [facility] from EOB. Pt rushing due to urgent need to have BM Toileting- Clothing Manipulation and Hygiene: Total assistance;Sit to/from stand Toileting - Clothing Manipulation Details (indicate cue type and reason): pt required UE support from PT While OT performed total A peri care     Functional mobility  during ADLs: Moderate assistance;+2 for physical assistance;+2 for safety/equipment (pivot only)       Vision Patient Visual Report: No change from baseline Additional Comments: was able to read written cues from therapist due to [facility]  status; able to read calendar on wall     Perception     Praxis      Pertinent Vitals/Pain Pain Assessment: Faces Faces Pain Scale: No hurt     Hand Dominance     Extremity/Trunk Assessment Upper Extremity Assessment Upper Extremity Assessment: Generalized weakness   Lower Extremity Assessment Lower Extremity Assessment: Generalized weakness       Communication Communication Communication: HOH;Deaf   Cognition Arousal/Alertness: Awake/alert Behavior During Therapy: WFL for tasks assessed/performed Overall Cognitive Status: Difficult to assess                                 General Comments: difficult to fully assess due to [facility] status, but overall followed visual cues and simple commands well. OT/PT wrote down commands for pt to follow, which he was able. Noted some slow processing throughout   General Comments       Exercises     Shoulder Instructions      Home Living Family/patient expects to be discharged to:: Private residence                                 Additional Comments: Unsure of home set up, pt not able to report this session- very HOH      Prior Functioning/Environment          Comments: Unsure of PLOF due to pt being poor historian and difficulty hearing        OT Problem List: Decreased strength;Decreased knowledge of use of DME or AE;Decreased activity tolerance;Decreased cognition;Impaired balance (sitting and/or standing);Decreased safety awareness      OT Treatment/Interventions: Self-care/ADL training;Therapeutic exercise;Patient/family education;Balance training;Energy conservation;Therapeutic activities;DME and/or AE instruction    OT Goals(Current goals can be found in the care plan section) Acute Rehab OT Goals Patient Stated Goal: use the bathroom OT Goal Formulation: With patient Time For Goal Achievement: 06/10/20 Potential to Achieve Goals: Good  OT Frequency: Min 2X/week   Barriers to D/C:             Co-evaluation PT/OT/SLP Co-Evaluation/Treatment: Yes Reason for Co-Treatment: For patient/therapist safety;To address functional/ADL transfers   OT goals addressed during session: ADL's and self-care;Proper use of Adaptive equipment and DME;Strengthening/ROM      AM-PAC OT "6 Clicks" Daily Activity     Outcome Measure Help from another person eating meals?: Total (NPO) Help from another person taking care of personal grooming?: A Little Help from another person toileting, which includes using toliet, bedpan, or urinal?: A Lot Help from another person bathing (including washing, rinsing, drying)?: A Lot Help from another person to put on and taking off regular upper body clothing?: A Little Help from another person to put on and taking off regular lower body clothing?: A Lot 6 Click Score: 13   End of Session Nurse Communication: Mobility status  Activity Tolerance: Patient tolerated treatment well Patient left: in bed;with call bell/phone within reach;with bed alarm set;with restraints reapplied  OT Visit Diagnosis: Unsteadiness on feet (R26.81);Other abnormalities of gait and mobility (R26.89);Muscle weakness (generalized) (M62.81)                Time:  0240-9735 OT Time Calculation (min): 25 min Charges:  OT General Charges $OT Visit: 1 Visit OT Evaluation $OT Eval Moderate Complexity: 1 Mod  Dalphine Handing, MSOT, OTR/L Acute Rehabilitation Services [facility] Office Number: [phone] Pager: [phone]  Dalphine Handing 05/27/2020, 5:14 PM

## 2020-05-27 NOTE — Evaluation (Addendum)
Clinical/Bedside Swallow Evaluation Patient Details  Name: Dwayne Barnes MRN: [id number] Date of Birth: 03-07-1951  Today's Date: 05/27/2020 Time: SLP Start Time (ACUTE ONLY): 1208 SLP Stop Time (ACUTE ONLY): 1220 SLP Time Calculation (min) (ACUTE ONLY): 12 min  Past Medical History:  Past Medical History:  Diagnosis Date   COVID-19 05/21/2020   PE (pulmonary thromboembolism) (HCC) 05/2020   Past Surgical History:  Past Surgical History:  Procedure Laterality Date   IR IVC FILTER PLMT / S&I /IMG GUID/MOD SED  05/24/2020   HPI:  ARLENE GENOVA is a 69 y.o. male who was admitted 11/18 with N/V, weakness and found to have pyuria, positive COVID test and LLL PE. Code blue for cardiac arrest. Intubated 06/10/10/23 (self textubated). Coded again once in ICU.    Assessment / Plan / Recommendation Clinical Impression  Pt has bilateral hearing aids but did not appear to hear therapist despite varying tones and intensities. He in endentulous, could not follow oral-motor commands but no focal weakness. Suspicious of poor secretion management and/or penetration/aspiration given wet vocal quality prior to po presentations. There were audible and multiple swallows accompanied by immediate cough with water despite varying volumes. Intubation with self extubation and Covid diagnosis warrant continued NPO. Pt currently has Cortrak and therapist will continue treatment to initiate food/texture versus recommendation for instrumental testing .        SLP Visit Diagnosis: Dysphagia, unspecified (R13.10)    Aspiration Risk  Moderate aspiration risk    Diet Recommendation NPO   Medication Administration: Via alternative means    Other  Recommendations Oral Care Recommendations: Oral care QID   Follow up Recommendations Other (comment) (TBD)      Frequency and Duration min 2x/week  2 weeks       Prognosis Prognosis for Safe Diet Advancement: Good      Swallow Study   General HPI: JEET SHOUGH is a 69 y.o. male who was admitted 11/18 with N/V, weakness and found to have pyuria, positive COVID test and LLL PE. Code blue for cardiac arrest. Intubated 06-10-2010/23 (self textubated). Coded again once in ICU.  Type of Study: Bedside Swallow Evaluation Previous Swallow Assessment:  (none) Diet Prior to this Study: NPO;NG Tube Temperature Spikes Noted: No Respiratory Status: Room air History of Recent Intubation: Yes Length of Intubations (days): 5 days Date extubated: 05/26/20 Behavior/Cognition: Alert;Other (Comment);Cooperative (did not appear to hear therapist) Oral Cavity Assessment:  (unable to fully view) Oral Care Completed by SLP: No Oral Cavity - Dentition: Edentulous Vision:  (?) Self-Feeding Abilities: Needs assist Patient Positioning: Upright in bed Baseline Vocal Quality: Normal Volitional Cough: Other (Comment) (could not hear command) Volitional Swallow:  (could not hear command)    Oral/Motor/Sensory Function Overall Oral Motor/Sensory Function:  (poor hearing and vision; no focal weakness)   Ice Chips Ice chips: Not tested   Thin Liquid Thin Liquid: Impaired Presentation: Cup Oral Phase Impairments: Reduced labial seal;Reduced lingual movement/coordination Oral Phase Functional Implications: Right anterior spillage;Left anterior spillage Pharyngeal  Phase Impairments: Cough - Immediate;Other (comments);Multiple swallows (audible)    Nectar Thick Nectar Thick Liquid: Not tested   Honey Thick Honey Thick Liquid: Not tested   Puree Puree: Impaired Presentation: Spoon Oral Phase Impairments: Reduced labial seal Oral Phase Functional Implications: Other (comment) (labial residue)   Solid     Solid: Not tested      Royce Macadamia 05/27/2020,1:11 PM Breck Coons [place].Ed Nurse, children's [phone] Office [phone]

## 2020-05-27 NOTE — Progress Notes (Signed)
Nutrition Follow-up  DOCUMENTATION CODES:   Not applicable  INTERVENTION:   Tube Feeding via Cortrak:  Vital 1.5 at 55 ml/hr Pro-Source 45 mL BID Provides 111 g of protein, 2060 kcals and 1003 mL of free water Meets 100% estimated calorie and protein needs   NUTRITION DIAGNOSIS:   Increased nutrient needs related to acute illness (COVID-19) as evidenced by estimated needs.  Ongoing.   GOAL:   Patient will meet greater than or equal to 90% of their needs  Met with TF.   MONITOR:   TF tolerance, Diet advancement  REASON FOR ASSESSMENT:   Malnutrition Screening Tool    ASSESSMENT:   69 yo male with no known PMH presents with N/V, weakness and back and admitted with COVID 19 pneumonia on 11/18, had PEA arrest on 11/19 and required intubation.  11/18 Admitted 11/19 PEA arrest, Intubated  11/23 self-extubated; agitated requiring Haldol 11/24 plan for Cortrak placement  Medications reviewed and include: SSI, solumedrol  Precedex Levophed - currently off  Labs reviewed: PO4: 3 (11/23)   Diet Order:   Diet Order            Diet NPO time specified  Diet effective now                 EDUCATION NEEDS:   No education needs have been identified at this time  Skin:  Skin Assessment: Reviewed RN Assessment  Last BM:  11/19  Height:   Ht Readings from Last 1 Encounters:  05/22/20 6' (1.829 m)    Weight:   Wt Readings from Last 1 Encounters:  05/27/20 64.6 kg    Ideal Body Weight:  80.9 kg  BMI:  Body mass index is 19.32 kg/m.  Estimated Nutritional Needs:   Kcal:  1624-4695 kcals  Protein:  95-130 g  Fluid:  > 2 L  Gorge Almanza P., RD, LDN, CNSC See AMiON for contact information

## 2020-05-27 NOTE — Progress Notes (Signed)
ANTICOAGULATION CONSULT NOTE  Pharmacy Consult for Heparin Indication: pulmonary embolus   No Known Allergies  Patient Measurements: Height: 6' (182.9 cm) Weight: 64.6 kg (142 lb 6.7 oz) IBW/kg (Calculated) : 77.6  Vital Signs: Temp: 98.4 F (36.9 C) (11/24 0300) Temp Source: Axillary (11/24 0300) BP: 143/75 (11/24 0700) Pulse Rate: 77 (11/24 0700)  Labs: Recent Labs    05/25/20 0500 05/25/20 0500 05/25/20 0506 05/25/20 1531 05/26/20 0500 05/26/20 1200 05/27/20 0500  HGB 9.3*   < > 9.5*  --  9.1*  --  9.3*  HCT 29.8*   < > 28.0*  --  28.6*  --  28.5*  PLT 270  --   --   --  257  --  250  HEPARINUNFRC 0.26*  --   --    < > 0.33 0.36 0.34  CREATININE 1.31*  --   --   --  1.20  --  1.00   < > = values in this interval not displayed.    Estimated Creatinine Clearance: 64.6 mL/min (by C-G formula based on SCr of 1 mg/dL).  Assessment: 69 y.o. male with PE s/p TNKase for PEA cardiac arrest now on heparin.  Heparin level continues to be therapeutic at 0.34 on 1350 units/hr. CBC stable, no signs of bleeding per RN.   Goal of Therapy:  Heparin level 0.3-0.7 units/ml  Monitor platelets by anticoagulation protocol: Yes   Plan:  Cont heparin 1350 units/hr Daily HL, CBC, monitor s/s bleeding  Kinnie Feil, PharmD PGY1 Acute Care Pharmacy Resident Phone: [phone] 05/27/2020 7:35 AM  Please check [email or web address] for unit specific pharmacy phone numbers.

## 2020-05-27 NOTE — Evaluation (Signed)
Physical Therapy Evaluation Patient Details Name: Dwayne Barnes MRN: [id number] DOB: December 12, 1950 Today's Date: 05/27/2020   History of Present Illness  69 yo admitted 11/18 with back pain, weakness and colitis found to be Covid (+) with LLL PE. 11/19 pt with cardiac arrest intubated, left femoral DVT with IVC filter placed 11/22. 11/23 pt self-extubated. 11/24 fem line removed. No significant PMHx  Clinical Impression  Co-session with OT focusing on OOB mobility so that pt can have a BM.  Very challenging communication as pt cannot hear even with bil hearing aids.  Best communication was through writing on a dry erase board for him to understand.  Min-mod assist for mobility and transfers.  He will likely need some post acute rehab before returning home.   PT to follow acutely for deficits listed below.      Follow Up Recommendations SNF    Equipment Recommendations  3in1 (PT);Rolling walker with 5" wheels    Recommendations for Other Services       Precautions / Restrictions Precautions Precautions: Fall Precaution Comments: pt is extremely HOH Restrictions Weight Bearing Restrictions: No      Mobility  Bed Mobility Overal bed mobility: Needs Assistance Bed Mobility: Supine to Sit;Sit to Supine     Supine to sit: Min assist Sit to supine: Min assist   General bed mobility comments: Visual cues and hand held assist to get to EOB and return to supine.  Pt moving legs most assist needed at trunk.      Transfers Overall transfer level: Needs assistance Equipment used: 2 person hand held assist Transfers: Sit to/from [employer] Sit to Stand: Mod assist;+2 physical assistance;+2 safety/equipment Stand pivot transfers: Mod assist;+2 physical assistance;+2 safety/equipment       General transfer comment: assist to power into standing and take pivotal steps over to [facility]. increased assist mostly due to safety because pt rushing to use [facility] for urgent  BM  Ambulation/Gait             General Gait Details: NT, would likely need an assistive device to be safe.   Stairs            Wheelchair Mobility    Modified Rankin (Stroke Patients Only)       Balance Overall balance assessment: Needs assistance Sitting-balance support: Feet supported Sitting balance-Leahy Scale: Fair     Standing balance support: Bilateral upper extremity supported Standing balance-Leahy Scale: Poor Standing balance comment: reliant on external support                             Pertinent Vitals/Pain Pain Assessment: Faces Faces Pain Scale: No hurt    Home Living Family/patient expects to be discharged to:: Private residence Living Arrangements: Alone Available Help at Discharge: Family             Additional Comments: Unsure of home set up, pt not able to report this session- very HOH.  Did best with writing to communicate on dry erase board    Prior Function           Comments: Unsure of PLOF due to pt being poor historian and difficulty hearing     Hand Dominance   Dominant Hand: Right    Extremity/Trunk Assessment   Upper Extremity Assessment Upper Extremity Assessment: Defer to OT evaluation    Lower Extremity Assessment Lower Extremity Assessment: Generalized weakness    Cervical / Trunk Assessment Cervical / Trunk  Assessment: Kyphotic  Communication   Communication: HOH;Deaf  Cognition Arousal/Alertness: Awake/alert Behavior During Therapy: WFL for tasks assessed/performed Overall Cognitive Status: No family/caregiver present to determine baseline cognitive functioning                                 General Comments: difficult to fully assess due to [facility] status, but overall followed visual cues and simple commands well. OT/PT wrote down commands for pt to follow, which he was able. Noted some slow processing throughout      General Comments General comments (skin integrity,  edema, etc.): HR up to 140s during mobility to [facility] and back.     Exercises     Assessment/Plan    PT Assessment Patient needs continued PT services  PT Problem List Decreased strength;Decreased activity tolerance;Decreased balance;Decreased mobility;Decreased knowledge of use of DME;Cardiopulmonary status limiting activity       PT Treatment Interventions DME instruction;Gait training;Stair training;Functional mobility training;Therapeutic activities;Therapeutic exercise;Balance training;Cognitive remediation;Neuromuscular re-education;Patient/family education    PT Goals (Current goals can be found in the Care Plan section)  Acute Rehab PT Goals Patient Stated Goal: use the bathroom PT Goal Formulation: Patient unable to participate in goal setting Time For Goal Achievement: 06/10/20 Potential to Achieve Goals: Good    Frequency Min 2X/week   Barriers to discharge        Co-evaluation PT/OT/SLP Co-Evaluation/Treatment: Yes Reason for Co-Treatment: Complexity of the patient's impairments (multi-system involvement);Necessary to address cognition/behavior during functional activity;For patient/therapist safety;To address functional/ADL transfers PT goals addressed during session: Mobility/safety with mobility;Balance;Strengthening/ROM OT goals addressed during session: ADL's and self-care;Proper use of Adaptive equipment and DME;Strengthening/ROM       AM-PAC PT "6 Clicks" Mobility  Outcome Measure Help needed turning from your back to your side while in a flat bed without using bedrails?: A Little Help needed moving from lying on your back to sitting on the side of a flat bed without using bedrails?: A Little Help needed moving to and from a bed to a chair (including a wheelchair)?: A Little Help needed standing up from a chair using your arms (e.g., wheelchair or bedside chair)?: A Lot Help needed to walk in hospital room?: A Lot Help needed climbing 3-5 steps with a  railing? : A Lot 6 Click Score: 15    End of Session   Activity Tolerance: Patient tolerated treatment well Patient left: in bed;with call bell/phone within reach;with bed alarm set;with restraints reapplied   PT Visit Diagnosis: Muscle weakness (generalized) (M62.81);Difficulty in walking, not elsewhere classified (R26.2)    Time: 1415-1440 PT Time Calculation (min) (ACUTE ONLY): 25 min   Charges:   PT Evaluation $PT Eval Moderate Complexity: 1 Mod        Corinna Capra, PT, DPT  Acute Rehabilitation [phone] pager [phone]) [phone] office

## 2020-05-27 NOTE — Procedures (Signed)
Cortrak  Person Inserting Tube:  Genni Buske E, RD Tube Type:  Cortrak - 43 inches Tube Location:  Left nare Initial Placement:  Stomach Secured by: Bridle Technique Used to Measure Tube Placement:  Documented cm marking at nare/ corner of mouth Cortrak Secured At:  65 cm    Cortrak Tube Team Note:  Consult received to place a Cortrak feeding tube.   No x-ray is required. RN may begin using tube.    If the tube becomes dislodged please keep the tube and contact the Cortrak team at [email or web address] (password [email or web address]) for replacement.  If after hours and replacement cannot be delayed, place a NG tube and confirm placement with an abdominal x-ray.    Mirah Nevins, MS, RD, LDN RD pager number and weekend/on-call pager number located in Amion.   

## 2020-05-27 NOTE — Progress Notes (Addendum)
NAME:  Dwayne OmanJack C Guggenheim, MRN:  [id number], DOB:  04/20/1951, LOS: 6 ADMISSION DATE:  05/20/2020, CONSULTATION DATE:  05/22/20 REFERRING MD:  Elgergawy  CHIEF COMPLAINT:  Cardiac Arrest   Brief History   Dwayne Barnes is a 69 y.o. male who was admitted 11/18 with N/V, weakness.  Workup in ED was notable for pyuria and positive COVID test. He later had CTA due to markedly elevated D-dimer which demonstrated large LLL PE.  He was started on heparin gtt along with steroids and remdesivir.  Unfortunately late afternoon 11/19, he had a cardiac arrest.  He was intubated during the code.  Total downtime of 2-6 minutes before ROSC. Soon after arrival in the ICU he coded again where 4 rounds of CPR were provided with ROSC.   Past Medical History  COVID 19  Acute PE   Significant Hospital Events   11/19 PEA cardiac arrest 11/19 Intubated 11/21 Off Epi. Awake / alert & extubation was considered however became acutely agitated and confused.  11/22 Remains on vent 11/23 Self extubated 11/24 More alert, speech clear / MAE  Consults:  PCCM  Procedures:  L IJ TLC 11/19 >>  ETT 11/19 >> 11/23 L Fem A-Line 11/19 >>   Significant Diagnostic Tests:  CT renal stone 11/18 >> non specific colitis, GGO's, left nephrolithiasis without obstructive uropathy. CTA chest 11/18 >> large PC in LLL, GGO's LLL. CT Head 11/24 >> unremarkable    Micro Data:  Urine 11/18 >> insignificant growth  COVID 11/18 >> positive Flu 11/18 >> negative  Antimicrobials:  Ceftriaxone x 1 in ED 11/18  Interim history/subjective:  Pt remains on amiodarone, heparin  Off pressors States he needs to go to the bathroom  Denies pain / SOB   Objective:  Blood pressure (!) 143/74, pulse 71, temperature 97.9 F (36.6 C), temperature source Axillary, resp. rate 17, height 6' (1.829 m), weight 64.6 kg, SpO2 95 %.        Intake/Output Summary (Last 24 hours) at 05/27/2020 0954 Last data filed at 05/27/2020 0800 Gross per 24  hour  Intake 652.83 ml  Output 1420 ml  Net -767.17 ml   Filed Weights   05/24/20 0500 05/26/20 0500 05/27/20 0500  Weight: 64.5 kg 64.5 kg 64.6 kg    Examination: General:  Thin adult male sitting up in bed watching TV HEENT: MM pink/moist, on RA Neuro: Awake, alert, oriented to self, speech clear, generalized weakness but moving all extremities / non-focal  CV: s1s2 RRR, no m/r/g PULM: non-labored, clear breath sounds bilaterally, brown sputum  GI: soft, bsx4 active  Extremities: warm/dry, no edema  Skin: no rashes or lesions   Assessment & Plan:   Obstructive shock secondary to LL PE with subsequent RV failure S/p PEA arrest.  Per Dr. Gala RomneyBensimhon, no indication for impella support  S/p tenecteplase on 11/19 @1827   [facility]  -monitor in ICU   LLL PE  Incidental finding on admission.  Received tPA during code.  Of note, there is reportedly a family hx of Factor V Leiden Deficiency however daughter declined this history -transition to full dose lovenox per pharmacy   Acute DVT in left femoral S/P Denali filter placement 11/21  -appreciate IR assistance with IVC filter  -transition to lovenox   Paroxysmal AF -tele monitoring  -stop amiodarone gtt  -lovenox as above   Agitated Delirium  Acute Metabolic Encephalopathy Self extubated 11/23 overnight, agitated / received haldol, now sedate. On heparin / AFwRVR.  Must also  consider possible anoxic injury with post arrest.  CT head 11/23 negative  -follow neuro status  -PT consult  -minimize all sedating medication   Acute hypoxic respiratory failure  S/p intubation during code in setting of PE  -pulmonary hygiene -IS, mobilize  -wean PEEP / fiO2 for sats >85% -follow intermittent CXR   AKI  -Trend BMP / urinary output -Replace electrolytes as indicated -Avoid nephrotoxic agents, ensure adequate renal perfusion  COVID PNA. Completed remdesivir 11/22  -steroid wean in place   Anemia  -trend  CBC  Colitis. -supportive care   At Risk Malnutrition  -SLP evaluation  -cortrak orders in place in the interim   [facility]  -family bringing batteries for hearing aides and glasses  Best Practice:  Diet: NPO. Pain/Anxiety/Delirium protocol (if indicated): n/a VAP protocol (if indicated): In place. DVT prophylaxis: transition to lovenox  GI prophylaxis: PPI. Glucose control: SSI if glucose consistently > 180. Mobility: Bedrest. Code Status: Full Code Family Communication: Updated daughter Bazen 11/24 via phone.  Reviewed with daughter that he is making slow progress at this point and it is reasonable to continue full code for now.  However, discussed that if he suffered another event in the near future that it would likely be a devastating event and he would not have good functional recovery.  Discussed that another prolonged ICU stay / prolonged ventilation would not add to his quality of life.  Disposition: ICU, likely to progressive care in am.  To TRH as of 11/25.   Labs    CBC: Recent Labs  Lab 05/22/20 1815 05/22/20 1948 05/23/20 0418 05/23/20 0418 05/24/20 0416 05/25/20 0500 05/25/20 0506 05/26/20 0500 05/27/20 0500  WBC 26.4*   < > 29.8*  --  24.1* 12.3*  --  14.6* 10.8*  NEUTROABS 24.6*  --  25.8*  --  21.0* 9.6*  --  12.4*  --   HGB 10.4*   < > 11.8*   < > 10.3* 9.3* 9.5* 9.1* 9.3*  HCT 32.9*   < > 35.7*   < > 32.0* 29.8* 28.0* 28.6* 28.5*  MCV 92.2   < > 90.4  --  90.1 90.6  --  92.6 90.8  PLT 423*   < > 436*  --  361 270  --  257 250   < > = values in this interval not displayed.   Basic Metabolic Panel: Recent Labs  Lab 05/22/20 0239 05/22/20 1815 05/23/20 0418 05/23/20 0418 05/23/20 1244 05/23/20 1244 05/24/20 0416 05/25/20 0500 05/25/20 0506 05/26/20 0500 05/27/20 0500  NA 138   < > 139   < > 141   < > 141 143 142 140 142  K 4.3   < > 3.5   < > 4.2   < > 4.4 4.5 4.4 3.9 4.0  CL 108   < > 105   < > 105  --  109 110  --  105 106  CO2 23   < >  20*   < > 23  --  23 26  --  26 27  GLUCOSE 137*   < > 351*   < > 160*  --  163* 121*  --  149* 137*  BUN 21   < > 37*   < > 43*  --  44* 41*  --  36* 24*  CREATININE 0.75   < > 2.15*   < > 2.08*  --  1.62* 1.31*  --  1.20 1.00  CALCIUM 8.8*   < >  8.4*   < > 8.3*  --  8.4* 8.5*  --  8.7* 8.6*  MG 2.1  --  2.4  --   --   --  2.3 2.3  --  2.3  --   PHOS 3.2  --  6.2*  --   --   --  2.9 1.8*  --  3.0  --    < > = values in this interval not displayed.   GFR: Estimated Creatinine Clearance: 64.6 mL/min (by C-G formula based on SCr of 1 mg/dL). Recent Labs  Lab 05/21/20 0012 05/21/20 1211 05/22/20 0239 05/22/20 1818 05/23/20 0418 05/24/20 0416 05/25/20 0500 05/26/20 0500 05/27/20 0500  PROCALCITON  --  <0.10  --   --   --   --   --   --   --   WBC  --   --    < >  --    < > 24.1* 12.3* 14.6* 10.8*  LATICACIDVEN 1.1  --   --  9.6*  --   --   --   --   --    < > = values in this interval not displayed.   Liver Function Tests: Recent Labs  Lab 05/23/20 0418 05/24/20 0416 05/25/20 0500 05/26/20 0500 05/27/20 0500  AST 130* 57* 33 39 50*  ALT 140* 101* 71* 57* 48*  ALKPHOS 192* 135* 114 103 89  BILITOT 0.7 0.8 0.6 0.8 0.6  PROT 5.8* 4.9* 5.0* 5.1* 4.8*  ALBUMIN 2.4* 2.2* 2.2* 2.2* 2.0*   Recent Labs  Lab 05/20/20 1951  LIPASE 31   No results for input(s): AMMONIA in the last 168 hours. ABG    Component Value Date/Time   PHART 7.417 05/25/2020 0506   PCO2ART 45.4 05/25/2020 0506   PO2ART 164 (H) 05/25/2020 0506   HCO3 29.2 (H) 05/25/2020 0506   TCO2 31 05/25/2020 0506   ACIDBASEDEF 2.0 05/22/2020 1948   O2SAT 99.0 05/25/2020 0506    Coagulation Profile: Recent Labs  Lab 05/22/20 1815  INR 1.8*   Cardiac Enzymes: No results for input(s): CKTOTAL, CKMB, CKMBINDEX, TROPONINI in the last 168 hours.  HbA1C: Hgb A1c MFr Bld  Date/Time Value Ref Range Status  05/22/2020 06:15 PM 5.5 4.8 - 5.6 % Final    Comment:    (NOTE) Pre diabetes:           5.7%-6.4%  Diabetes:              >6.4%  Glycemic control for   <7.0% adults with diabetes    CBG: Recent Labs  Lab 05/26/20 1727 05/26/20 1955 05/26/20 2348 05/27/20 0336 05/27/20 0801  GLUCAP 145* 132* 136* 115* 127*    Critical Care Time: 33 minutes   Canary Brim, MSN, NP-C, AGACNP-BC [facility] 05/27/2020, 9:54 AM   Please see [email or web address] for pager details.      Pulmonary critical care attending:  This is a 69 year old male admitted on 05/21/2020 Covid positive found to have a PE, obstructive shock cardiac arrest status post TPA after four rounds of CPR.  Patient is now extubated on nasal cannula O2 stable at this time for transfer from the intensive care unit.  BP (!) 143/74 (BP Location: Right Arm)   Pulse 71   Temp 97.9 F (36.6 C) (Axillary)   Resp 17   Ht 6' (1.829 m)   Wt 64.6 kg   SpO2 95%   BMI 19.32 kg/m   General: Frail elderly male,  resting in bed. HEENT: Tracking appropriately, hard of hearing, difficulty seeing Heart: Regular rhythm S1-S2 Lungs: Clear to auscultation diminished breath sounds no crackles no wheeze  Labs: Reviewed  Assessment: Obstructive shock secondary to PE, cardiac arrest status post TPA, stable at this time off vasopressors. Acute DVT left femoral, status post filter Continue anticoagulation History of PAF Encephalopathy secondary to above, Acute hypoxemic respiratory failure secondary above, weaning from nasal cannula O2 at this time  Plan: Continue anticoagulation Continue mobilization Pulmonary hygiene Remove femoral line PT OT consultation Steroid taper  Lovenox BID  Patient stable for transfer from the ICU.  Josephine Igo, DO [facility] 05/27/2020 11:39 AM

## 2020-05-28 DIAGNOSIS — I824Y2 Acute embolism and thrombosis of unspecified deep veins of left proximal lower extremity: Secondary | ICD-10-CM

## 2020-05-28 DIAGNOSIS — J9601 Acute respiratory failure with hypoxia: Secondary | ICD-10-CM | POA: Diagnosis not present

## 2020-05-28 DIAGNOSIS — I2609 Other pulmonary embolism with acute cor pulmonale: Secondary | ICD-10-CM | POA: Diagnosis not present

## 2020-05-28 DIAGNOSIS — U071 COVID-19: Secondary | ICD-10-CM | POA: Diagnosis not present

## 2020-05-28 LAB — GLUCOSE, CAPILLARY
Glucose-Capillary: 109 mg/dL — ABNORMAL HIGH (ref 70–99)
Glucose-Capillary: 110 mg/dL — ABNORMAL HIGH (ref 70–99)
Glucose-Capillary: 117 mg/dL — ABNORMAL HIGH (ref 70–99)
Glucose-Capillary: 127 mg/dL — ABNORMAL HIGH (ref 70–99)
Glucose-Capillary: 134 mg/dL — ABNORMAL HIGH (ref 70–99)
Glucose-Capillary: 148 mg/dL — ABNORMAL HIGH (ref 70–99)

## 2020-05-28 LAB — CBC
HCT: 30.9 % — ABNORMAL LOW (ref 39.0–52.0)
Hemoglobin: 10 g/dL — ABNORMAL LOW (ref 13.0–17.0)
MCH: 28.7 pg (ref 26.0–34.0)
MCHC: 32.4 g/dL (ref 30.0–36.0)
MCV: 88.5 fL (ref 80.0–100.0)
Platelets: 277 10*3/uL (ref 150–400)
RBC: 3.49 MIL/uL — ABNORMAL LOW (ref 4.22–5.81)
RDW: 14.7 % (ref 11.5–15.5)
WBC: 13.7 10*3/uL — ABNORMAL HIGH (ref 4.0–10.5)
nRBC: 0 % (ref 0.0–0.2)

## 2020-05-28 LAB — BASIC METABOLIC PANEL
Anion gap: 10 (ref 5–15)
BUN: 29 mg/dL — ABNORMAL HIGH (ref 8–23)
CO2: 26 mmol/L (ref 22–32)
Calcium: 8.4 mg/dL — ABNORMAL LOW (ref 8.9–10.3)
Chloride: 110 mmol/L (ref 98–111)
Creatinine, Ser: 0.97 mg/dL (ref 0.61–1.24)
GFR, Estimated: >60 mL/min (ref >60)
Glucose, Bld: 130 mg/dL — ABNORMAL HIGH (ref 70–99)
Potassium: 3.7 mmol/L (ref 3.5–5.1)
Sodium: 146 mmol/L — ABNORMAL HIGH (ref 135–145)

## 2020-05-28 MED ORDER — PANTOPRAZOLE SODIUM 40 MG IV SOLR
40.0000 mg | INTRAVENOUS | Status: DC
  Administered 2020-05-28 – 2020-06-02 (×6): 40 mg via INTRAVENOUS
  Filled 2020-05-28 (×6): qty 40

## 2020-05-28 MED ORDER — RESOURCE THICKENUP CLEAR PO POWD
Freq: Once | ORAL | Status: AC
  Filled 2020-05-28: qty 125

## 2020-05-28 NOTE — Progress Notes (Signed)
PROGRESS NOTE                                                                                                                                                                                                             Patient Demographics:    Dwayne Barnes, is a 69 y.o. male, DOB - Nov 12, 1950, WUJ:[id number]  Outpatient Primary MD for the patient is [name], PA-C   Admit date - 05/20/2020   LOS - 7  Chief Complaint  Patient presents with  . Fatigue       Brief Narrative: Patient is a 69 y.o. male with no significant past medical history-presented to the ED on 11/17 with nausea/vomiting, weakness and back pain-further evaluation revealed COVID-19 infection and a large PE.  Patient was started on IV heparin infusion along with steroid/Remdesivir-on 11/19-patient had a cardiac arrest-was intubated during the code-total downtime of around 6 minutes before ROSC.  Patient was subsequently transferred to the ICU-unfortunately soon after arrival in the ICU-he had another cardiac arrest-and 4 rounds of CPR were provided with ROSC.  See below for further details.   COVID-19 vaccinated status: Vaccinated  Significant Events: 11/17>> Admit to [facility], [place] for PE/COVID-19 infection 11/19 PEA cardiac arrest x2-given TPA 11/19 Intubated 11/21 Off Epi. Awake / alert & extubation was considered however became acutely agitated and confused.  11/22 Remains on vent 11/23 Self extubated 11/24 More alert, speech clear / MAE 11/25 transfer to [facility]  Significant studies: CT renal stone 11/18 >> non specific colitis, GGO's, left nephrolithiasis without obstructive uropathy. CTA chest 11/18 >> large PC in LLL, GGO's LLL. 11/20 echo>> EF 50-55%, abnormal septal motion/septal flattening consistent with RV pressure overload.  RV systolic function severely reduced. 11/21>> bilateral lower extremity Doppler: Findings consistent with acute DVT involving  the left femoral vein, acute mobile thrombus in the proximal/mid femoral vein. CT Head 11/23 >> unremarkable    COVID-19 medications: Steroids: 11/17>> Remdesivir: 11/17>> 11/22  Antibiotics: None  Microbiology data: 11/18>> urine culture: Insignificant growth  Procedures: 11/19 >> 11/23:ETT 11/21>> IVC filter placement by IR.  Consults: PCCM, IR  DVT prophylaxis: Therapeutic Lovenox    Subjective:    Dwayne Barnes today was lying comfortably in bed-she denied any chest pain or shortness of breath.  Appears to be somewhat slow in responding-needs some redirection but just about able to answer  questions appropriately.   Assessment  & Plan :   Cardiac arrest due to large PE: S/p TPA-now on therapeutic Lovenox.  PE likely provoked by COVID-19 infection.  PCCM MD discussed with Dr. Illa Level indication for Impella support.  Continue telemetry monitoring.  Echo as above.  Large PE with DVT: Continue therapeutic anticoagulation-he is s/p TPA on 11/19 when he had cardiac arrest.  Patient is s/p IVC filter given mobile thrombus seen on lower extremity Doppler.  Acute hypoxic respiratory failure: Due to PE/cardiac arrest/COVID-19 pneumonia-self extubated on 11/23-currently stable on room air.  Acute metabolic encephalopathy/ICU delirium/possible anoxic injury: Seems to be slowly improving-although slow to respond-follows commands with redirection.  Continue to follow mental status-CT head without any acute changes.  Continue minimize all sedating medications.  Dysphagia: In the setting of cardiac arrest-currently with NG tube in place-remains n.p.o.  AKI: Likely hemodynamically mediated-improved-monitor electrolytes.  Replete.  COVID-19 pneumonia: Completed a course of Remdesivir-continue to taper steroids.  Hypoxia is markedly improved.  Fever: afebrile O2 requirements:  SpO2: 98 % O2 Flow Rate (L/min): 2 L/min FiO2 (%): 30 %   COVID-19 Labs: Recent Labs     05/26/20 0500  DDIMER 6.37*  FERRITIN 388*  CRP 19.6*       Component Value Date/Time   BNP 499.2 (H) 05/23/2020 1244    Recent Labs  Lab 05/21/20 1211  PROCALCITON <0.10    Lab Results  Component Value Date   SARSCOV2NAA POSITIVE (A) 05/21/2020    Paroxysmal atrial fibrillation: Repeat twelve-lead EKG-appears to be back in sinus rhythm-on anticoagulation for PE.  Echo as above.  Rate  Normocytic anemia: Secondary to critical illness-stable for monitoring-no indication for PRBC transfusion.  Transaminitis: Likely secondary to COVID-19-downtrending.  Deconditioning/debility: Secondary to critical illness-await PT/OT eval.   GI prophylaxis: PPI  ABG:    Component Value Date/Time   PHART 7.417 05/25/2020 0506   PCO2ART 45.4 05/25/2020 0506   PO2ART 164 (H) 05/25/2020 0506   HCO3 29.2 (H) 05/25/2020 0506   TCO2 31 05/25/2020 0506   ACIDBASEDEF 2.0 05/22/2020 1948   O2SAT 99.0 05/25/2020 0506    Vent Settings: N/    Condition - Extremely Guarded  Family Communication  :  Daughter Chidera Thivierge [phone])  updated over the phone 11/25  Code Status :  Full Code  Diet :  Diet Order            Diet NPO time specified  Diet effective now                  Disposition Plan  :   Status is: Inpatient  Remains inpatient appropriate because:Inpatient level of care appropriate due to severity of illness   Dispo: The patient is from: Home              Anticipated d/c is to: TBD              Anticipated d/c date is: > 3 days              Patient currently is not medically stable to d/c.   Barriers to discharge: Hypoxia requiring O2 supplementation/complete 5 days of IV Remdesivir  Antimicorbials  :    Anti-infectives (From admission, onward)   Start     Dose/Rate Route Frequency Ordered Stop   05/22/20 1000  remdesivir 100 mg in sodium chloride 0.9 % 100 mL IVPB       "Followed by" Linked Group Details   100 mg 200 mL/hr over  30 Minutes Intravenous  Daily 05/21/20 0857 05/25/20 1154   05/21/20 1030  remdesivir 200 mg in sodium chloride 0.9% 250 mL IVPB  Status:  Discontinued       "Followed by" Linked Group Details   200 mg 580 mL/hr over 30 Minutes Intravenous Once 05/21/20 0857 05/27/20 0734   05/21/20 0330  metroNIDAZOLE (FLAGYL) tablet 500 mg        500 mg Oral  Once 05/21/20 0320 05/21/20 0411   05/20/20 2330  cefTRIAXone (ROCEPHIN) 2 g in sodium chloride 0.9 % 100 mL IVPB        2 g 200 mL/hr over 30 Minutes Intravenous  Once 05/20/20 2324 05/21/20 0331      Inpatient Medications  Scheduled Meds: . Chlorhexidine Gluconate Cloth  6 each Topical Daily  . enoxaparin (LOVENOX) injection  60 mg Subcutaneous Q12H  . feeding supplement (PROSource TF)  45 mL Per Tube BID  . feeding supplement (VITAL HIGH PROTEIN)  1,000 mL Per Tube Q24H  . insulin aspart  0-9 Units Subcutaneous Q4H  . methylPREDNISolone (SOLU-MEDROL) injection  20 mg Intravenous Daily   Followed by  . [START ON 05/29/2020] methylPREDNISolone (SOLU-MEDROL) injection  10 mg Intravenous Daily  . sodium chloride flush  10-40 mL Intracatheter Q12H   Continuous Infusions: . feeding supplement (VITAL 1.5 CAL) 1,000 mL (05/27/20 1732)   PRN Meds:.acetaminophen (TYLENOL) oral liquid 160 mg/5 mL, [DISCONTINUED] ondansetron **OR** ondansetron (ZOFRAN) IV, sodium chloride flush, sodium phosphate   Time Spent in minutes  35   See all Orders from today for further details   Jeoffrey Massed M.D on 05/28/2020 at 8:48 AM  To page go to [email or web address] - use universal password  [facility] -  Office  [phone]    Objective:   Vitals:   05/28/20 0530 05/28/20 0545 05/28/20 0600 05/28/20 0700  BP: (!) 146/88  127/84 (!) 151/76  Pulse: 70 66 (!) 139 92  Resp: 19 15 18 15   Temp:      TempSrc:      SpO2: 96% 96% 98% 98%  Weight:      Height:        Wt Readings from Last 3 Encounters:  05/28/20 62 kg  12/20/14 66 kg     Intake/Output Summary (Last 24  hours) at 05/28/2020 0848 Last data filed at 05/28/2020 0500 Gross per 24 hour  Intake 104.56 ml  Output 835 ml  Net -730.44 ml     Physical Exam Gen Exam: Alert-but somewhat slow-not in any distress.  Follows commands but with redirection.  In Restraints this morning. HEENT:atraumatic, normocephalic Chest: B/L clear to auscultation anteriorly CVS:S1S2 regular Abdomen:soft non tender, non distended Extremities:no edema Neurology: Moving all 4 extremities. Skin: no rash   Data Review:    CBC Recent Labs  Lab 05/22/20 1815 05/22/20 1948 05/23/20 0418 05/23/20 0418 05/24/20 0416 05/24/20 0416 05/25/20 0500 05/25/20 0506 05/26/20 0500 05/27/20 0500 05/28/20 0811  WBC 26.4*   < > 29.8*   < > 24.1*  --  12.3*  --  14.6* 10.8* 13.7*  HGB 10.4*   < > 11.8*   < > 10.3*   < > 9.3* 9.5* 9.1* 9.3* 10.0*  HCT 32.9*   < > 35.7*   < > 32.0*   < > 29.8* 28.0* 28.6* 28.5* 30.9*  PLT 423*   < > 436*   < > 361  --  270  --  257 250 277  MCV 92.2   < >  90.4   < > 90.1  --  90.6  --  92.6 90.8 88.5  MCH 29.1   < > 29.9   < > 29.0  --  28.3  --  29.4 29.6 28.7  MCHC 31.6   < > 33.1   < > 32.2  --  31.2  --  31.8 32.6 32.4  RDW 13.2   < > 13.5   < > 13.9  --  14.6  --  14.6 14.6 14.7  LYMPHSABS 1.1  --  1.0  --  1.1  --  1.3  --  0.5*  --   --   MONOABS 0.8  --  1.8*  --  1.6*  --  1.3*  --  1.6*  --   --   EOSABS 0.0  --  0.0  --  0.0  --  0.0  --  0.0  --   --   BASOSABS 0.0  --  0.1  --  0.1  --  0.0  --  0.0  --   --    < > = values in this interval not displayed.    Chemistries  Recent Labs  Lab 05/22/20 0239 05/22/20 1815 05/23/20 0418 05/23/20 0418 05/23/20 1244 05/23/20 1244 05/24/20 0416 05/25/20 0500 05/25/20 0506 05/26/20 0500 05/27/20 0500  NA 138   < > 139   < > 141   < > 141 143 142 140 142  K 4.3   < > 3.5   < > 4.2   < > 4.4 4.5 4.4 3.9 4.0  CL 108   < > 105   < > 105  --  109 110  --  105 106  CO2 23   < > 20*   < > 23  --  23 26  --  26 27  GLUCOSE 137*    < > 351*   < > 160*  --  163* 121*  --  149* 137*  BUN 21   < > 37*   < > 43*  --  44* 41*  --  36* 24*  CREATININE 0.75   < > 2.15*   < > 2.08*  --  1.62* 1.31*  --  1.20 1.00  CALCIUM 8.8*   < > 8.4*   < > 8.3*  --  8.4* 8.5*  --  8.7* 8.6*  MG 2.1  --  2.4  --   --   --  2.3 2.3  --  2.3  --   AST 27  --  130*  --   --   --  57* 33  --  39 50*  ALT 23  --  140*  --   --   --  101* 71*  --  57* 48*  ALKPHOS 70  --  192*  --   --   --  135* 114  --  103 89  BILITOT 0.7  --  0.7  --   --   --  0.8 0.6  --  0.8 0.6   < > = values in this interval not displayed.   ------------------------------------------------------------------------------------------------------------------ No results for input(s): CHOL, HDL, LDLCALC, TRIG, CHOLHDL, LDLDIRECT in the last 72 hours.  Lab Results  Component Value Date   HGBA1C 5.5 05/22/2020   ------------------------------------------------------------------------------------------------------------------ No results for input(s): TSH, T4TOTAL, T3FREE, THYROIDAB in the last 72 hours.  Invalid input(s): FREET3 ------------------------------------------------------------------------------------------------------------------ Recent Labs    05/26/20 0500  FERRITIN 388*  Coagulation profile Recent Labs  Lab 05/22/20 1815  INR 1.8*    Recent Labs    05/26/20 0500  DDIMER 6.37*    Cardiac Enzymes No results for input(s): CKMB, TROPONINI, MYOGLOBIN in the last 168 hours.  Invalid input(s): CK ------------------------------------------------------------------------------------------------------------------    Component Value Date/Time   BNP 499.2 (H) 05/23/2020 1244    Micro Results Recent Results (from the past 240 hour(s))  Respiratory Panel by RT PCR (Flu A&B, Covid) - Nasopharyngeal Swab     Status: Abnormal   Collection Time: 05/21/20  1:23 AM   Specimen: Nasopharyngeal Swab  Result Value Ref Range Status   SARS Coronavirus 2  by RT PCR POSITIVE (A) NEGATIVE Final    Comment: RESULT CALLED TO, READ BACK BY AND VERIFIED WITH: A. COSTALES,RN 0252 05/21/2020 T. TYSOR (NOTE) SARS-CoV-2 target nucleic acids are DETECTED.  SARS-CoV-2 RNA is generally detectable in upper respiratory specimens  during the acute phase of infection. Positive results are indicative of the presence of the identified virus, but do not rule out bacterial infection or co-infection with other pathogens not detected by the test. Clinical correlation with patient history and other diagnostic information is necessary to determine patient infection status. The expected result is Negative.  Fact Sheet for Patients:  https://www.moore.com/  Fact Sheet for Healthcare Providers: https://www.young.biz/  This test is not yet approved or cleared by the [place] FDA and  has been authorized for detection and/or diagnosis of SARS-CoV-2 by FDA under an Emergency Use Authorization (EUA).  This EUA will remain in effect (meaning this test ca n be used) for the duration of  the COVID-19 declaration under Section 564(b)(1) of the Act, 21 U.S.C. section 360bbb-3(b)(1), unless the authorization is terminated or revoked sooner.      Influenza A by PCR NEGATIVE NEGATIVE Final   Influenza B by PCR NEGATIVE NEGATIVE Final    Comment: (NOTE) The Xpert Xpress SARS-CoV-2/FLU/RSV assay is intended as an aid in  the diagnosis of influenza from Nasopharyngeal swab specimens and  should not be used as a sole basis for treatment. Nasal washings and  aspirates are unacceptable for Xpert Xpress SARS-CoV-2/FLU/RSV  testing.  Fact Sheet for Patients: https://www.moore.com/  Fact Sheet for Healthcare Providers: https://www.young.biz/  This test is not yet approved or cleared by the [place] FDA and  has been authorized for detection and/or diagnosis of SARS-CoV-2 by  FDA under  an Emergency Use Authorization (EUA). This EUA will remain  in effect (meaning this test can be used) for the duration of the  Covid-19 declaration under Section 564(b)(1) of the Act, 21  U.S.C. section 360bbb-3(b)(1), unless the authorization is  terminated or revoked. Performed at [facility], [address], [place], [place] [address]   Urine Culture     Status: Abnormal   Collection Time: 05/21/20  2:54 AM   Specimen: Urine, Random  Result Value Ref Range Status   Specimen Description URINE, RANDOM  Final   Special Requests NONE  Final   Culture (A)  Final    <10,000 COLONIES/mL INSIGNIFICANT GROWTH Performed at [facility], [address], [place], [place] [address]    Report Status 05/22/2020 FINAL  Final    Radiology Reports CT HEAD WO CONTRAST  Result Date: 05/26/2020 CLINICAL DATA:  69 year old male with altered mental status. EXAM: CT HEAD WITHOUT CONTRAST TECHNIQUE: Contiguous axial images were obtained from the base of the skull through the vertex without intravenous contrast. COMPARISON:  Head CT  dated 04/18/2010. FINDINGS: Brain: The ventricles and sulci appropriate size for patient's age. The gray-white matter discrimination is preserved. There is no acute intracranial hemorrhage. No mass effect midline shift. No extra-axial fluid collection. Vascular: No hyperdense vessel or unexpected calcification. Skull: Normal. Negative for fracture or focal lesion. Sinuses/Orbits: No acute finding. Other: None IMPRESSION: Unremarkable noncontrast CT of the brain. Electronically Signed   By: Elgie Collard M.D.   On: 05/26/2020 15:45   CT ANGIO CHEST PE W OR WO CONTRAST  Addendum Date: 05/21/2020   ADDENDUM REPORT: 05/21/2020 19:28 ADDENDUM: Critical Value/emergent results were called by telephone at the time of interpretation on 05/21/2020 at 7:27 pm to provider Dr. Dierdre Highman, who verbally acknowledged these results. Of note the airspace disease in the LEFT lower lobe is  progressed from CT same day. Differential remains the same. Electronically Signed   By: Genevive Bi M.D.   On: 05/21/2020 19:28   Result Date: 05/21/2020 CLINICAL DATA:  PE suspected, high probability.  COVID-19 infection EXAM: CT ANGIOGRAPHY CHEST WITH CONTRAST TECHNIQUE: Multidetector CT imaging of the chest was performed using the standard protocol during bolus administration of intravenous contrast. Multiplanar CT image reconstructions and MIPs were obtained to evaluate the vascular anatomy. CONTRAST:  18mL OMNIPAQUE IOHEXOL 300 MG/ML  SOLN COMPARISON:  None. FINDINGS: Cardiovascular: Large filling defect within the proximal LEFT lower lobe pulmonary artery. Filling defect spans the bifurcation of the LEFT lower lobe and lingular branch pulmonary branches. This this filling defect/thrombus is occlusive to the LEFT lower lobe. No LEFT upper lobe filling defects identified. No filling defect identified within the RIGHT lung pulmonary arteries. No evidence of RIGHT ventricular strain with the RV to LV ratio less than 1. Coronary artery calcification and aortic atherosclerotic calcification. Mediastinum/Nodes: No axillary or supraclavicular adenopathy. No mediastinal or hilar adenopathy. No pericardial fluid. Esophagus normal. Lungs/Pleura: There is diffuse very fine airspace disease within the LEFT lower lobe involving the majority of the LEFT lower lobe. Similar milder findings in the posterior RIGHT lower lobe and lingula. Upper Abdomen: Limited view of the liver, kidneys, pancreas are unremarkable. Normal adrenal glands. Musculoskeletal: No aggressive osseous lesion. Review of the MIP images confirms the above findings. IMPRESSION: 1. Large occlusive pulmonary embolism within the proximal LEFT lower lobe pulmonary artery and lingular pulmonary artery. 2. Diffuse fine airspace disease within the LEFT lower lobe with differential including pulmonary infarction, pulmonary hemorrhage, or pneumonia (including  COVID pneumonia). 3. No evidence of RIGHT ventricular strain. 4. Coronary artery calcification and aortic atherosclerotic calcification. Critical Value/emergent results were called by telephone at the time of interpretation on 05/21/2020 at 6:55 pm to provider [facility] , who verbally acknowledged these results. Electronically Signed: By: Genevive Bi M.D. On: 05/21/2020 18:59   IR IVC FILTER PLMT / S&I Lenise Arena GUID/MOD SED  Result Date: 05/24/2020 INDICATION: 69 year old with recent cardiac arrest likely secondary to pulmonary embolism and COVID-19. Subsequent right ventricular failure. Patient has clot in the left pulmonary arteries and clot in left femoral vein. There is concern that the patient would not tolerate additional clot burden in the pulmonary arteries and request for IVC filter placement. EXAM: IVC FILTER PLACEMENT; IVC VENOGRAM; ULTRASOUND FOR VASCULAR ACCESS Physician: Rachelle Hora. Lowella Dandy, MD MEDICATIONS: None. ANESTHESIA/SEDATION: Versed 1 mg The patient was continuously monitored during the procedure by the interventional radiology nurse under my direct supervision. CONTRAST:  Carbon dioxide FLUOROSCOPY TIME:  Fluoroscopy Time: 3 minutes, 18 seconds, 18 mGy COMPLICATIONS: None immediate. PROCEDURE: Informed consent was obtained for  an IVC filter placement from the patient's daughter. Ultrasound demonstrated a patent right internal jugular vein. Ultrasound images were obtained for documentation. The right neck was prepped and draped in a sterile fashion. Maximal barrier sterile technique was utilized including caps, mask, sterile gowns, sterile gloves, sterile drape, hand hygiene and skin antiseptic. The skin was anesthetized with 1% lidocaine. A 21 gauge needle was directed into the vein with ultrasound guidance and a micropuncture dilator set was placed. A wire was advanced into the IVC. The filter sheath was advanced over the wire into the IVC. An IVC venogram was performed with carbon  dioxide. Bilateral renal veins were cannulated using a 5 French catheter and Bentson wire. Bilateral renal veins are identified. Fluoroscopic images were obtained for documentation. A Bard Denali filter was deployed below the lowest renal vein. Post placement images of the filter were obtained. The vascular sheath was removed with manual compression. Bandage placed over the puncture site. FINDINGS: IVC was patent. Bilateral renal veins were identified. The filter was deployed below the lowest renal vein. IMPRESSION: Successful placement of a retrievable IVC filter. PLAN: This IVC filter is potentially retrievable. The patient will be assessed for filter retrieval by Interventional Radiology in approximately 8-12 weeks. Further recommendations regarding filter retrieval, continued surveillance or declaration of device permanence, will be made at that time. Electronically Signed   By: Richarda Overlie M.D.   On: 05/24/2020 17:57   DG CHEST PORT 1 VIEW  Result Date: 05/26/2020 CLINICAL DATA:  Acute respiratory failure.  Hypoxia. EXAM: PORTABLE CHEST 1 VIEW COMPARISON:  05/22/2020. FINDINGS: Interim extubation and removal of NG tube. Left IJ line in stable position. Heart size normal. Diffuse left lung infiltrate. Right base infiltrate. No prominent pleural effusion. No pneumothorax. IMPRESSION: 1. Interim extubation and removal of NG tube. Left IJ line stable position. 2. Diffuse left lung infiltrate and right base infiltrate. Electronically Signed   By: Maisie Fus  Register   On: 05/26/2020 05:50   DG CHEST PORT 1 VIEW  Result Date: 05/22/2020 CLINICAL DATA:  Central line placement, intubation EXAM: PORTABLE CHEST 1 VIEW COMPARISON:  05/21/2020 FINDINGS: Left central line has been placed with the tip in the SVC. No pneumothorax. Endotracheal tube is 5 cm above the carina. Patchy airspace disease throughout the left lung. No focal opacity on the right. Heart is normal size. IMPRESSION: Endotracheal tube 5 cm above the  carina. Left central line tip in the SVC. No pneumothorax. Stable patchy left lung airspace disease. Electronically Signed   By: Charlett Nose M.D.   On: 05/22/2020 21:06   DG Chest Port 1 View  Result Date: 05/21/2020 CLINICAL DATA:  Cough, lower back pain, has not eaten 5 days, emesis EXAM: PORTABLE CHEST 1 VIEW COMPARISON:  Radiograph 04/19/2010, CT 04/18/2010 FINDINGS: Heterogeneous opacities present predominantly along the periphery of the left lung and minimally in the right lung base as well. No pneumothorax or effusion. The aorta is calcified. The remaining cardiomediastinal contours are unremarkable. No acute osseous or soft tissue abnormality. Degenerative changes are present in the imaged spine and shoulders. Telemetry leads overlie the chest. IMPRESSION: Heterogeneous opacities throughout the lungs, greatest in the left lung periphery concerning for pneumonia including potential viral etiology. Aortic Atherosclerosis (ICD10-I70.0). Electronically Signed   By: Kreg Shropshire M.D.   On: 05/21/2020 03:50   DG Abd Portable 1V  Result Date: 05/23/2020 CLINICAL DATA:  Encounter for orogastric tube placement. EXAM: PORTABLE ABDOMEN - 1 VIEW COMPARISON:  06/09/2009 and chest radiograph 05/22/2020 FINDINGS:  Gastric tube extends into the abdomen and the tip is in the midline of the lower abdomen, likely in the distal stomach body region. Again noted are patchy densities at the left lung base. Few gas-filled loops of bowel in the abdomen. IMPRESSION: Orogastric tube in the lower abdominal midline and likely in the distal stomach body region. Electronically Signed   By: Richarda Overlie M.D.   On: 05/23/2020 12:17   ECHOCARDIOGRAM COMPLETE  Result Date: 05/23/2020    ECHOCARDIOGRAM REPORT   Patient Name:   CORRIGAN KRETSCHMER Date of Exam: 05/23/2020 Medical Rec #:  [id number]      Height:       72.0 in Accession #:    [id number]     Weight:       142.4 lb Date of Birth:  Oct 10, 1950      BSA:          1.844 m  Patient Age:    69 years       BP:           112/85 mmHg Patient Gender: M              HR:           96 bpm. Exam Location:  Inpatient Procedure: 2D Echo, Cardiac Doppler and Color Doppler STAT ECHO Indications:    I26.02 Pulmonary embolus  History:        Patient has no prior history of Echocardiogram examinations.                 Arrythmias:Cardiac Arrest; Signs/Symptoms:Dyspnea, Hypotension                 and Altered Mental Status. Covid 19 positive.  Sonographer:    Sheralyn Boatman RDCS Referring Phys: [id number] Martina Sinner  Sonographer Comments: Technically difficult study due to poor echo windows and echo performed with patient supine and on artificial respirator. IMPRESSIONS  1. Severe RV failure and cor pulmonale.  2. Abnormal septal motion and septal flattening consistent with RV pressure overload. Left ventricular ejection fraction, by estimation, is 50 to 55%. The left ventricle has low normal function. The left ventricle has no regional wall motion abnormalities. Left ventricular diastolic parameters were normal.  3. Right ventricular systolic function is severely reduced. The right ventricular size is severely enlarged. There is mildly elevated pulmonary artery systolic pressure.  4. The mitral valve is normal in structure. No evidence of mitral valve regurgitation. No evidence of mitral stenosis.  5. The aortic valve is normal in structure. Aortic valve regurgitation is not visualized. No aortic stenosis is present.  6. The inferior vena cava is dilated in size with <50% respiratory variability, suggesting right atrial pressure of 15 mmHg. FINDINGS  Left Ventricle: Abnormal septal motion and septal flattening consistent with RV pressure overload. Left ventricular ejection fraction, by estimation, is 50 to 55%. The left ventricle has low normal function. The left ventricle has no regional wall motion abnormalities. The left ventricular internal cavity size was normal in size. There is no left  ventricular hypertrophy. Left ventricular diastolic parameters were normal. Right Ventricle: The right ventricular size is severely enlarged. Right vetricular wall thickness was not assessed. Right ventricular systolic function is severely reduced. There is mildly elevated pulmonary artery systolic pressure. The tricuspid regurgitant velocity is 2.67 m/s, and with an assumed right atrial pressure of 8 mmHg, the estimated right ventricular systolic pressure is 36.5 mmHg. Left Atrium: Left atrial size was normal in size. Right  Atrium: Right atrial size was normal in size. Pericardium: There is no evidence of pericardial effusion. Mitral Valve: The mitral valve is normal in structure. No evidence of mitral valve regurgitation. No evidence of mitral valve stenosis. Tricuspid Valve: The tricuspid valve is normal in structure. Tricuspid valve regurgitation is mild . No evidence of tricuspid stenosis. Aortic Valve: The aortic valve is normal in structure. Aortic valve regurgitation is not visualized. No aortic stenosis is present. Pulmonic Valve: The pulmonic valve was normal in structure. Pulmonic valve regurgitation is not visualized. No evidence of pulmonic stenosis. Aorta: The aortic root is normal in size and structure. Venous: The inferior vena cava is dilated in size with less than 50% respiratory variability, suggesting right atrial pressure of 15 mmHg. IAS/Shunts: No atrial level shunt detected by color flow Doppler. Additional Comments: Severe RV failure and cor pulmonale.  LEFT VENTRICLE PLAX 2D LVIDd:         3.70 cm     Diastology LVIDs:         2.80 cm     LV e' medial:    4.46 cm/s LV PW:         1.10 cm     LV E/e' medial:  4.9 LV IVS:        1.10 cm     LV e' lateral:   6.97 cm/s LVOT diam:     2.10 cm     LV E/e' lateral: 3.1 LV SV:         28 LV SV Index:   15 LVOT Area:     3.46 cm  LV Volumes (MOD) LV vol d, MOD A2C: 22.6 ml LV vol d, MOD A4C: 25.5 ml LV vol s, MOD A2C: 11.1 ml LV vol s, MOD A4C: 11.7  ml LV SV MOD A2C:     11.5 ml LV SV MOD A4C:     25.5 ml LV SV MOD BP:      13.7 ml RIGHT VENTRICLE            IVC RV S prime:     8.84 cm/s  IVC diam: 2.50 cm TAPSE (M-mode): 1.3 cm LEFT ATRIUM           Index      RIGHT ATRIUM           Index LA diam:      2.80 cm 1.52 cm/m RA Area:     14.30 cm LA Vol (A4C): 10.5 ml 5.69 ml/m RA Volume:   42.70 ml  23.15 ml/m  AORTIC VALVE LVOT Vmax:   53.80 cm/s LVOT Vmean:  41.900 cm/s LVOT VTI:    0.080 m  AORTA Ao Root diam: 3.60 cm Ao Asc diam:  3.50 cm MITRAL VALVE               TRICUSPID VALVE MV Area (PHT): 3.27 cm    TR Peak grad:   28.5 mmHg MV Decel Time: 232 msec    TR Vmax:        267.00 cm/s MV E velocity: 21.94 cm/s MV A velocity: 41.30 cm/s  SHUNTS MV E/A ratio:  0.53        Systemic VTI:  0.08 m                            Systemic Diam: 2.10 cm Charlton Haws MD Electronically signed by Charlton Haws MD Signature Date/Time: 05/23/2020/10:46:38 AM    Final  CT RENAL STONE STUDY  Result Date: 05/21/2020 CLINICAL DATA:  69 year old male with low back pain, flank pain, decreased p.o. EXAM: CT ABDOMEN AND PELVIS WITHOUT CONTRAST TECHNIQUE: Multidetector CT imaging of the abdomen and pelvis was performed following the standard protocol without IV contrast. COMPARISON:  Noncontrast CT Abdomen and Pelvis 10/28/2008. Portable chest radiograph 04/19/2010. FINDINGS: Lower chest: Patchy, irregular peribronchial and peripheral scattered pulmonary opacity at both lung bases. This seems to be new from last month. Underlying probable pulmonary hyperinflation. Some associated lung base bronchiectasis. No cavitating areas identified. No cardiomegaly, pericardial effusion or pleural effusion. Hepatobiliary: Negative noncontrast liver and gallbladder. Pancreas: Negative. Spleen: Negative. Adrenals/Urinary Tract: Bulky left nephrolithiasis measuring 13 mm at the renal pelvis. Additional left lower pole stone. No hydronephrosis. Negative noncontrast right kidney. No  hydroureter. Unremarkable urinary bladder. Incidental pelvic phleboliths. Stomach/Bowel: Decompressed large bowel. There is long segment circumferential wall thickening in the right colon, up to the 12 mm series 4, image 49) with some areas of adjacent mesenteric stranding (coronal image 43). The wall thickening gradually decreases through the transverse colon. No free air. No free fluid. The terminal ileum appear spared. Appendix seems to remain normal on coronal image 32. No dilated small bowel.  Decompressed stomach and duodenum. Vascular/Lymphatic: Normal caliber abdominal aorta. Mild calcified atherosclerosis. Vascular patency is not evaluated in the absence of IV contrast. No lymphadenopathy identified in the absence of contrast. Reproductive: Negative. Other: Previous lower abdominal ventral hernia repair with mesh. No pelvic free fluid. Musculoskeletal: Degenerative changes in the spine. No acute osseous abnormality identified. IMPRESSION: 1. In the abdomen there is circumferential bowel wall thickening and mesenteric stranding involving the right colon. Wall thickening gradually abates through the transverse colon. This is compatible with Acute Nonspecific Colitis. 2. But the lung bases are also abnormal with patchy and irregular peribronchial and peripheral pulmonary opacity suspicious for acute viral/atypical respiratory infection. No areas of cavitation to suggest septic emboli. No pleural effusion. 3. Bulky left nephrolithiasis, but no obstructive uropathy. 4. Previous lower abdominal hernia repair with mesh. Electronically Signed   By: Odessa Fleming M.D.   On: 05/21/2020 02:46   VAS [place] LOWER EXTREMITY VENOUS (DVT)  Result Date: 05/24/2020  Lower Venous DVT Study Indications: Covid-19, pulmonary embolism.  Risk Factors: Confirmed PE. Anticoagulation: Heparin. Limitations: Body habitus, ventilation, did not perform compressions in thigh and popliteal fossa of the left lower extremity secondary to mobile  thrombus, bandages and line. Comparison Study: No prior study Performing Technologist: Sherren Kerns RVS  Examination Guidelines: A complete evaluation includes B-mode imaging, spectral Doppler, color Doppler, and power Doppler as needed of all accessible portions of each vessel. Bilateral testing is considered an integral part of a complete examination. Limited examinations for reoccurring indications may be performed as noted. The reflux portion of the exam is performed with the patient in reverse Trendelenburg.  +---------+---------------+---------+-----------+----------+--------------+ RIGHT    CompressibilityPhasicitySpontaneityPropertiesThrombus Aging +---------+---------------+---------+-----------+----------+--------------+ CFV      Full           Yes      No                                  +---------+---------------+---------+-----------+----------+--------------+ SFJ      Full                                                        +---------+---------------+---------+-----------+----------+--------------+  FV Prox  Full                                                        +---------+---------------+---------+-----------+----------+--------------+ FV Mid   Full                                                        +---------+---------------+---------+-----------+----------+--------------+ FV DistalFull                                                        +---------+---------------+---------+-----------+----------+--------------+ PFV      Full                                                        +---------+---------------+---------+-----------+----------+--------------+ POP      Full           Yes      No                                  +---------+---------------+---------+-----------+----------+--------------+ PTV      Full                                                         +---------+---------------+---------+-----------+----------+--------------+ PERO     Full                                                        +---------+---------------+---------+-----------+----------+--------------+   +---------+---------------+---------+-----------+----------+-------------------+ LEFT     CompressibilityPhasicitySpontaneityPropertiesThrombus Aging      +---------+---------------+---------+-----------+----------+-------------------+ CFV                                                   not visualized                                                            secondary to  bandage/line        +---------+---------------+---------+-----------+----------+-------------------+ SFJ                                                   not visualized                                                            secondary to                                                              bandage/line        +---------+---------------+---------+-----------+----------+-------------------+ FV Prox  Full                                         patent by color     +---------+---------------+---------+-----------+----------+-------------------+ FV Mid                                                patent by color     +---------+---------------+---------+-----------+----------+-------------------+ FV Distal                                             patent by color     +---------+---------------+---------+-----------+----------+-------------------+ PFV                                                   patent by color     +---------+---------------+---------+-----------+----------+-------------------+ POP                                                   patent by color     +---------+---------------+---------+-----------+----------+-------------------+ PTV      Full                                                              +---------+---------------+---------+-----------+----------+-------------------+ PERO     Full                                                             +---------+---------------+---------+-----------+----------+-------------------+  Summary: RIGHT: - There is no evidence of deep vein thrombosis in the lower extremity.  vessels are dilated with significantly sluggish flow, throughout  LEFT: - Findings consistent with acute deep vein thrombosis involving the left femoral vein. - Acute, mobile thrombus noted in the proximal and mid femoral vein. Vessels are dilated with significantly sluggish flow, throughout.  *See table(s) above for measurements and observations. Electronically signed by Waverly Ferrarihristopher Dickson MD on 05/24/2020 at 6:21:22 PM.    Final

## 2020-05-28 NOTE — Progress Notes (Signed)
  Speech Language Pathology Treatment: Dysphagia  Patient Details Name: Dwayne Barnes MRN: [id number] DOB: [birth year] Today's Date: 05/28/2020 Time: 1751-0258 SLP Time Calculation (min) (ACUTE ONLY): 30 min  Assessment / Plan / Recommendation Clinical Impression  F/u after yesterday's clinical swallow evaluation. Pt alert, talking more, his hearing appears to be slightly better today.  HA present.  He participated in oral care, then PO trials including purees, thin and honey-thick liquids.  RUE restraint released and he was able to self-feed applesauce with notable improvement in hand-to-mouth coordination and oral attention as session progressed with tactile/verbal cues.  Sips of water led to consistent cough response, concerning for potential aspiration.  Coughing was eliminated with honey-thick liquids.  Recommend initiating a dysphagia 1 diet with honey-thick liquids for now; continue meds via cortrak or crushed in puree.  He may have ice chips between meals and after oral care.  D/W RN.  SLP will follow for safety, diet advancement and to determine if instrumental swallow study will be warranted.    HPI HPI: Dwayne Barnes is a 69 y.o. male who was admitted 11/18 with N/V, weakness and found to have pyuria, positive COVID test and LLL PE. Code blue for cardiac arrest. Intubated 06-03-2010/23 (self extubated). Coded again once in ICU.       SLP Plan  Continue with current plan of care       Recommendations  Diet recommendations: Dysphagia 1 (puree);Honey-thick liquid Liquids provided via: Cup Medication Administration: Crushed with puree Supervision: Patient able to self feed;Staff to assist with self feeding Compensations: Minimize environmental distractions;Slow rate;Small sips/bites                Oral Care Recommendations: Oral care BID Follow up Recommendations:  (tbd) SLP Visit Diagnosis: Dysphagia, unspecified (R13.10) Plan: Continue with current plan of care        GO              Kathaleen Dudziak L. Samson Frederic, MA CCC/SLP Acute Rehabilitation Services Office number [phone] Pager [phone]   [name] 05/28/2020, 2:48 PM

## 2020-05-28 NOTE — Progress Notes (Signed)
ANTICOAGULATION CONSULT NOTE  Pharmacy Consult for Lovenox Indication: pulmonary embolus   No Known Allergies  Patient Measurements: Height: 6' (182.9 cm) Weight: 62 kg (136 lb 11 oz) IBW/kg (Calculated) : 77.6  Vital Signs: Temp: 98.2 F (36.8 C) (11/25 0411) Temp Source: Oral (11/25 0411) BP: 151/76 (11/25 0700) Pulse Rate: 92 (11/25 0700)  Labs: Recent Labs    05/26/20 0500 05/26/20 0500 05/26/20 1200 05/27/20 0500 05/28/20 0811  HGB 9.1*   < >  --  9.3* 10.0*  HCT 28.6*  --   --  28.5* 30.9*  PLT 257  --   --  250 277  HEPARINUNFRC 0.33  --  0.36 0.34  --   CREATININE 1.20  --   --  1.00 0.97   < > = values in this interval not displayed.    Estimated Creatinine Clearance: 63.9 mL/min (by C-G formula based on SCr of 0.97 mg/dL).  Assessment: 69 y.o. male with PE s/p TNKase for PEA cardiac arrest now on Lovenox.  CBC is stable. No bleeding noted.  Discussed change to orals with Dr Jerral Ralph - wants to hold off until advance oral diet  Goal of Therapy:  Heparin level 0.3-0.7 units/ml  Monitor platelets by anticoagulation protocol: Yes   Plan:  Continue Lovenox 60mg  SQ every 12 hours.  Monitor SCr, CBC, monitor s/s bleeding  , PharmD, BCPS, BCCCP Clinical Pharmacist Please refer to [facility] for [facility] Pharmacy numbers 05/28/2020 10:50 AM  Please check [email or web address] for unit specific pharmacy phone numbers.

## 2020-05-29 DIAGNOSIS — I2609 Other pulmonary embolism with acute cor pulmonale: Secondary | ICD-10-CM | POA: Diagnosis not present

## 2020-05-29 DIAGNOSIS — J9601 Acute respiratory failure with hypoxia: Secondary | ICD-10-CM | POA: Diagnosis not present

## 2020-05-29 DIAGNOSIS — I824Y2 Acute embolism and thrombosis of unspecified deep veins of left proximal lower extremity: Secondary | ICD-10-CM | POA: Diagnosis not present

## 2020-05-29 DIAGNOSIS — U071 COVID-19: Secondary | ICD-10-CM | POA: Diagnosis not present

## 2020-05-29 LAB — CBC
HCT: 32.6 % — ABNORMAL LOW (ref 39.0–52.0)
Hemoglobin: 10.4 g/dL — ABNORMAL LOW (ref 13.0–17.0)
MCH: 29.2 pg (ref 26.0–34.0)
MCHC: 31.9 g/dL (ref 30.0–36.0)
MCV: 91.6 fL (ref 80.0–100.0)
Platelets: 296 10*3/uL (ref 150–400)
RBC: 3.56 MIL/uL — ABNORMAL LOW (ref 4.22–5.81)
RDW: 15 % (ref 11.5–15.5)
WBC: 19.3 10*3/uL — ABNORMAL HIGH (ref 4.0–10.5)
nRBC: 0 % (ref 0.0–0.2)

## 2020-05-29 LAB — COMPREHENSIVE METABOLIC PANEL
ALT: 52 U/L — ABNORMAL HIGH (ref 0–44)
AST: 49 U/L — ABNORMAL HIGH (ref 15–41)
Albumin: 2 g/dL — ABNORMAL LOW (ref 3.5–5.0)
Alkaline Phosphatase: 119 U/L (ref 38–126)
Anion gap: 8 (ref 5–15)
BUN: 30 mg/dL — ABNORMAL HIGH (ref 8–23)
CO2: 27 mmol/L (ref 22–32)
Calcium: 8.7 mg/dL — ABNORMAL LOW (ref 8.9–10.3)
Chloride: 109 mmol/L (ref 98–111)
Creatinine, Ser: 0.89 mg/dL (ref 0.61–1.24)
GFR, Estimated: >60 mL/min (ref >60)
Glucose, Bld: 124 mg/dL — ABNORMAL HIGH (ref 70–99)
Potassium: 3.6 mmol/L (ref 3.5–5.1)
Sodium: 144 mmol/L (ref 135–145)
Total Bilirubin: 0.9 mg/dL (ref 0.3–1.2)
Total Protein: 5 g/dL — ABNORMAL LOW (ref 6.5–8.1)

## 2020-05-29 LAB — GLUCOSE, CAPILLARY
Glucose-Capillary: 140 mg/dL — ABNORMAL HIGH (ref 70–99)
Glucose-Capillary: 98 mg/dL (ref 70–99)
Glucose-Capillary: 104 mg/dL — ABNORMAL HIGH (ref 70–99)
Glucose-Capillary: 179 mg/dL — ABNORMAL HIGH (ref 70–99)
Glucose-Capillary: 117 mg/dL — ABNORMAL HIGH (ref 70–99)
Glucose-Capillary: 109 mg/dL — ABNORMAL HIGH (ref 70–99)

## 2020-05-29 LAB — MAGNESIUM: Magnesium: 1.9 mg/dL (ref 1.7–2.4)

## 2020-05-29 MED ORDER — FREE WATER: 200.0000 mL | Freq: Three times a day (TID) | Status: DC

## 2020-05-29 MED ORDER — VITAL 1.5 CAL PO LIQD
1000.0000 mL | ORAL | Status: DC
  Administered 2020-05-29: 1000 mL

## 2020-05-29 MED ORDER — LOPERAMIDE HCL 2 MG PO CAPS
2.0000 mg | ORAL_CAPSULE | ORAL | Status: DC | PRN
  Administered 2020-05-29 – 2020-06-01 (×3): 2 mg via ORAL
  Filled 2020-05-29 (×3): qty 1

## 2020-05-29 NOTE — Plan of Care (Signed)
  Problem: Education: Goal: Knowledge of risk factors and measures for prevention of condition will improve Outcome: Progressing   Problem: Coping: Goal: Psychosocial and spiritual needs will be supported Outcome: Progressing   Problem: Respiratory: Goal: Will maintain a patent airway Outcome: Progressing Goal: Complications related to the disease process, condition or treatment will be avoided or minimized Outcome: Progressing   Problem: Activity: Goal: Ability to tolerate increased activity will improve Outcome: Progressing   Problem: Clinical Measurements: Goal: Ability to maintain a body temperature in the normal range will improve Outcome: Progressing   Problem: Respiratory: Goal: Ability to maintain adequate ventilation will improve Outcome: Progressing Goal: Ability to maintain a clear airway will improve Outcome: Progressing   Problem: Education: Goal: Knowledge of General Education information will improve Description: Including pain rating scale, medication(s)/side effects and non-pharmacologic comfort measures Outcome: Progressing   Problem: Health Behavior/Discharge Planning: Goal: Ability to manage health-related needs will improve Outcome: Progressing   Problem: Clinical Measurements: Goal: Ability to maintain clinical measurements within normal limits will improve Outcome: Progressing Goal: Will remain free from infection Outcome: Progressing Goal: Diagnostic test results will improve Outcome: Progressing Goal: Respiratory complications will improve Outcome: Progressing Goal: Cardiovascular complication will be avoided Outcome: Progressing   Problem: Activity: Goal: Risk for activity intolerance will decrease Outcome: Progressing   Problem: Nutrition: Goal: Adequate nutrition will be maintained Outcome: Progressing   Problem: Coping: Goal: Level of anxiety will decrease Outcome: Progressing   Problem: Elimination: Goal: Will not experience  complications related to bowel motility Outcome: Progressing Goal: Will not experience complications related to urinary retention Outcome: Progressing   Problem: Pain Managment: Goal: General experience of comfort will improve Outcome: Progressing   Problem: Safety: Goal: Ability to remain free from injury will improve Outcome: Progressing   Problem: Skin Integrity: Goal: Risk for impaired skin integrity will decrease Outcome: Progressing   

## 2020-05-29 NOTE — Progress Notes (Signed)
PROGRESS NOTE                                                                                                                                                                                                             Patient Demographics:    Dwayne Barnes, is a 69 y.o. male, DOB - 08-04-1950, JYN:[id number]  Outpatient Primary MD for the patient is [name], PA-C   Admit date - 05/20/2020   LOS - 8  Chief Complaint  Patient presents with  . Fatigue       Brief Narrative: Patient is a 69 y.o. male with no significant past medical history-presented to the ED on 11/17 with nausea/vomiting, weakness and back pain-further evaluation revealed COVID-19 infection and a large PE.  Patient was started on IV heparin infusion along with steroid/Remdesivir-on 11/19-patient had a cardiac arrest-was intubated during the code-total downtime of around 6 minutes before ROSC.  Patient was subsequently transferred to the ICU-unfortunately soon after arrival in the ICU-he had another cardiac arrest-and 4 rounds of CPR were provided with ROSC.  See below for further details.   COVID-19 vaccinated status: Vaccinated  Significant Events: 11/17>> Admit to [facility] for PE/COVID-19 infection 11/19>> PEA cardiac arrest x2-given TPA 11/19>> Intubated 11/21>> Off Epi. Awake / alert & extubation was considered however became acutely agitated and confused.  11/22>> Remains on vent 11/23>> Self extubated 11/24>> More alert, speech clear / MAE 11/25>> transfer to [facility]  Significant studies: CT renal stone 11/18 >> non specific colitis, GGO's, left nephrolithiasis without obstructive uropathy. CTA chest 11/18 >> large PC in LLL, GGO's LLL. 11/20 echo>> EF 50-55%, abnormal septal motion/septal flattening consistent with RV pressure overload.  RV systolic function severely reduced. 11/21>> bilateral lower extremity Doppler: Findings consistent with acute  DVT involving the left femoral vein, acute mobile thrombus in the proximal/mid femoral vein. CT Head 11/23 >> unremarkable    COVID-19 medications: Steroids: 11/17>> Remdesivir: 11/17>> 11/22  Antibiotics: None  Microbiology data: 11/18>> urine culture: Insignificant growth  Procedures: 11/19 >> 11/23:ETT 11/21>> IVC filter placement by IR.  Consults: PCCM, IR  DVT prophylaxis: Therapeutic Lovenox    Subjective:   No major issues overnight-patient was sitting up in bed-working on his breakfast.  Still slow but a bit more fluent than yesterday.  Able to tell me his name.   Assessment  &  Plan :   Cardiac arrest due to large PE: S/p TPA-now on therapeutic Lovenox.  PE likely provoked by COVID-19 infection.  PCCM MD discussed with Dr. Illa LevelBensimhon-no indication for Impella support.  Continue telemetry monitoring.  Echo as above.  Large PE with DVT: Continue therapeutic anticoagulation-he is s/p TPA on 11/19 when he had cardiac arrest.  Patient is s/p IVC filter given mobile thrombus seen on lower extremity Doppler.  Once oral intake stabilizes-we can consider transitioning to oral anticoagulation.  Acute hypoxic respiratory failure: Due to PE/cardiac arrest/COVID-19 pneumonia-self extubated on 11/23-currently stable on room air.  Acute metabolic encephalopathy/ICU delirium/possible anoxic injury: Seems to be slowly improving-although slow to respond-follows commands with redirection.  Continue to follow mental status-CT head without any acute changes.  Continue minimize all sedating medications.  Dysphagia: In the setting of cardiac arrest-seen by SLP-started on dysphagia 1 diet which per nursing staff he is tolerating well-discontinue NG tube today.  Monitor closely-await further evaluation from SLP.  AKI: Likely hemodynamically mediated-improved-monitor electrolytes.  Replete.  COVID-19 pneumonia: Completed a course of Remdesivir-continue to taper steroids.  Hypoxia is markedly  improved.  Fever: afebrile O2 requirements:  SpO2: 98 % O2 Flow Rate (L/min): 2 L/min FiO2 (%): 30 %   COVID-19 Labs: No results for input(s): DDIMER, FERRITIN, LDH, CRP in the last 72 hours.     Component Value Date/Time   BNP 499.2 (H) 05/23/2020 1244    No results for input(s): PROCALCITON in the last 168 hours.  Lab Results  Component Value Date   SARSCOV2NAA POSITIVE (A) 05/21/2020    Paroxysmal atrial fibrillation: appears to be back in sinus rhythm-on anticoagulation for PE.  Echo as above.    Normocytic anemia: Secondary to critical illness-stable for monitoring-no indication for PRBC transfusion.  Transaminitis: Likely secondary to COVID-19-downtrending.  Deconditioning/debility: Secondary to critical illness-await PT/OT eval.   GI prophylaxis: PPI  ABG:    Component Value Date/Time   PHART 7.417 05/25/2020 0506   PCO2ART 45.4 05/25/2020 0506   PO2ART 164 (H) 05/25/2020 0506   HCO3 29.2 (H) 05/25/2020 0506   TCO2 31 05/25/2020 0506   ACIDBASEDEF 2.0 05/22/2020 1948   O2SAT 99.0 05/25/2020 0506    Vent Settings: N/    Condition - Extremely Guarded  Family Communication  :  Daughter [name]([name] [phone])  updated over the phone 11/26  Code Status :  Full Code  Diet :  Diet Order            DIET - DYS 1 Room service appropriate? No; Fluid consistency: Honey Thick  Diet effective now                  Disposition Plan  :   Status is: Inpatient  Remains inpatient appropriate because:Inpatient level of care appropriate due to severity of illness   Dispo: The patient is from: Home              Anticipated d/c is to: TBD              Anticipated d/c date is: > 3 days              Patient currently is not medically stable to d/c.   Barriers to discharge: Hypoxia requiring O2 supplementation/complete 5 days of IV Remdesivir  Antimicorbials  :    Anti-infectives (From admission, onward)   Start     Dose/Rate Route Frequency Ordered  Stop   05/22/20 1000  remdesivir 100 mg in sodium chloride  0.9 % 100 mL IVPB       "Followed by" Linked Group Details   100 mg 200 mL/hr over 30 Minutes Intravenous Daily 05/21/20 0857 05/25/20 1154   05/21/20 1030  remdesivir 200 mg in sodium chloride 0.9% 250 mL IVPB  Status:  Discontinued       "Followed by" Linked Group Details   200 mg 580 mL/hr over 30 Minutes Intravenous Once 05/21/20 0857 05/27/20 0734   05/21/20 0330  metroNIDAZOLE (FLAGYL) tablet 500 mg        500 mg Oral  Once 05/21/20 0320 05/21/20 0411   05/20/20 2330  cefTRIAXone (ROCEPHIN) 2 g in sodium chloride 0.9 % 100 mL IVPB        2 g 200 mL/hr over 30 Minutes Intravenous  Once 05/20/20 2324 05/21/20 0331      Inpatient Medications  Scheduled Meds: . Chlorhexidine Gluconate Cloth  6 each Topical Daily  . enoxaparin (LOVENOX) injection  60 mg Subcutaneous Q12H  . feeding supplement (PROSource TF)  45 mL Per Tube BID  . free water  200 mL Per Tube Q8H  . insulin aspart  0-9 Units Subcutaneous Q4H  . pantoprazole (PROTONIX) IV  40 mg Intravenous Q24H  . sodium chloride flush  10-40 mL Intracatheter Q12H   Continuous Infusions: . feeding supplement (VITAL 1.5 CAL) 1,000 mL (05/29/20 1232)   PRN Meds:.acetaminophen (TYLENOL) oral liquid 160 mg/5 mL, [DISCONTINUED] ondansetron **OR** ondansetron (ZOFRAN) IV, sodium chloride flush, sodium phosphate   Time Spent in minutes  25   See all Orders from today for further details   Jeoffrey Massed M.D on 05/29/2020 at 1:30 PM  To page go to [email or web address] - use universal password  [facility] -  Office  [phone]    Objective:   Vitals:   05/29/20 0900 05/29/20 1000 05/29/20 1100 05/29/20 1146  BP: 131/88 138/84 134/76   Pulse: 72 72 74   Resp: 18 16 (!) 21   Temp:    99.6 F (37.6 C)  TempSrc:    Axillary  SpO2: 96% 97% 98%   Weight:      Height:        Wt Readings from Last 3 Encounters:  05/29/20 61.5 kg  12/20/14 66 kg      Intake/Output Summary (Last 24 hours) at 05/29/2020 1330 Last data filed at 05/29/2020 0900 Gross per 24 hour  Intake 1605 ml  Output 1370 ml  Net 235 ml     Physical Exam Gen Exam: Not in any distress. HEENT:atraumatic, normocephalic Chest: B/L clear to auscultation anteriorly CVS:S1S2 regular Abdomen:soft non tender, non distended Extremities:no edema Neurology: Non focal Skin: no rash   Data Review:    CBC Recent Labs  Lab 05/22/20 1815 05/22/20 1948 05/23/20 0418 05/23/20 0418 05/24/20 0416 05/24/20 0416 05/25/20 0500 05/25/20 0500 05/25/20 0506 05/26/20 0500 05/27/20 0500 05/28/20 0811 05/29/20 0341  WBC 26.4*   < > 29.8*   < > 24.1*   < > 12.3*  --   --  14.6* 10.8* 13.7* 19.3*  HGB 10.4*   < > 11.8*   < > 10.3*   < > 9.3*   < > 9.5* 9.1* 9.3* 10.0* 10.4*  HCT 32.9*   < > 35.7*   < > 32.0*   < > 29.8*   < > 28.0* 28.6* 28.5* 30.9* 32.6*  PLT 423*   < > 436*   < > 361   < > 270  --   --  257 250 277 296  MCV 92.2   < > 90.4   < > 90.1   < > 90.6  --   --  92.6 90.8 88.5 91.6  MCH 29.1   < > 29.9   < > 29.0   < > 28.3  --   --  29.4 29.6 28.7 29.2  MCHC 31.6   < > 33.1   < > 32.2   < > 31.2  --   --  31.8 32.6 32.4 31.9  RDW 13.2   < > 13.5   < > 13.9   < > 14.6  --   --  14.6 14.6 14.7 15.0  LYMPHSABS 1.1  --  1.0  --  1.1  --  1.3  --   --  0.5*  --   --   --   MONOABS 0.8  --  1.8*  --  1.6*  --  1.3*  --   --  1.6*  --   --   --   EOSABS 0.0  --  0.0  --  0.0  --  0.0  --   --  0.0  --   --   --   BASOSABS 0.0  --  0.1  --  0.1  --  0.0  --   --  0.0  --   --   --    < > = values in this interval not displayed.    Chemistries  Recent Labs  Lab 05/23/20 0418 05/23/20 1244 05/24/20 0416 05/24/20 0416 05/25/20 0500 05/25/20 0500 05/25/20 0506 05/26/20 0500 05/27/20 0500 05/28/20 0811 05/29/20 0341  NA 139   < > 141   < > 143   < > 142 140 142 146* 144  K 3.5   < > 4.4   < > 4.5   < > 4.4 3.9 4.0 3.7 3.6  CL 105   < > 109   < > 110  --    --  105 106 110 109  CO2 20*   < > 23   < > 26  --   --  26 27 26 27   GLUCOSE 351*   < > 163*   < > 121*  --   --  149* 137* 130* 124*  BUN 37*   < > 44*   < > 41*  --   --  36* 24* 29* 30*  CREATININE 2.15*   < > 1.62*   < > 1.31*  --   --  1.20 1.00 0.97 0.89  CALCIUM 8.4*   < > 8.4*   < > 8.5*  --   --  8.7* 8.6* 8.4* 8.7*  MG 2.4  --  2.3  --  2.3  --   --  2.3  --   --  1.9  AST 130*  --  57*  --  33  --   --  39 50*  --  49*  ALT 140*  --  101*  --  71*  --   --  57* 48*  --  52*  ALKPHOS 192*  --  135*  --  114  --   --  103 89  --  119  BILITOT 0.7  --  0.8  --  0.6  --   --  0.8 0.6  --  0.9   < > = values in this interval not displayed.   ------------------------------------------------------------------------------------------------------------------ No results for input(s):  CHOL, HDL, LDLCALC, TRIG, CHOLHDL, LDLDIRECT in the last 72 hours.  Lab Results  Component Value Date   HGBA1C 5.5 05/22/2020   ------------------------------------------------------------------------------------------------------------------ No results for input(s): TSH, T4TOTAL, T3FREE, THYROIDAB in the last 72 hours.  Invalid input(s): FREET3 ------------------------------------------------------------------------------------------------------------------ No results for input(s): VITAMINB12, FOLATE, FERRITIN, TIBC, IRON, RETICCTPCT in the last 72 hours.  Coagulation profile Recent Labs  Lab 05/22/20 1815  INR 1.8*    No results for input(s): DDIMER in the last 72 hours.  Cardiac Enzymes No results for input(s): CKMB, TROPONINI, MYOGLOBIN in the last 168 hours.  Invalid input(s): CK ------------------------------------------------------------------------------------------------------------------    Component Value Date/Time   BNP 499.2 (H) 05/23/2020 1244    Micro Results Recent Results (from the past 240 hour(s))  Respiratory Panel by RT PCR (Flu A&B, Covid) - Nasopharyngeal Swab      Status: Abnormal   Collection Time: 05/21/20  1:23 AM   Specimen: Nasopharyngeal Swab  Result Value Ref Range Status   SARS Coronavirus 2 by RT PCR POSITIVE (A) NEGATIVE Final    Comment: RESULT CALLED TO, READ BACK BY AND VERIFIED WITH: A. COSTALES,RN 0252 05/21/2020 T. TYSOR (NOTE) SARS-CoV-2 target nucleic acids are DETECTED.  SARS-CoV-2 RNA is generally detectable in upper respiratory specimens  during the acute phase of infection. Positive results are indicative of the presence of the identified virus, but do not rule out bacterial infection or co-infection with other pathogens not detected by the test. Clinical correlation with patient history and other diagnostic information is necessary to determine patient infection status. The expected result is Negative.  Fact Sheet for Patients:  https://www.moore.com/  Fact Sheet for Healthcare Providers: https://www.young.biz/  This test is not yet approved or cleared by the [place] FDA and  has been authorized for detection and/or diagnosis of SARS-CoV-2 by FDA under an Emergency Use Authorization (EUA).  This EUA will remain in effect (meaning this test ca n be used) for the duration of  the COVID-19 declaration under Section 564(b)(1) of the Act, 21 U.S.C. section 360bbb-3(b)(1), unless the authorization is terminated or revoked sooner.      Influenza A by PCR NEGATIVE NEGATIVE Final   Influenza B by PCR NEGATIVE NEGATIVE Final    Comment: (NOTE) The Xpert Xpress SARS-CoV-2/FLU/RSV assay is intended as an aid in  the diagnosis of influenza from Nasopharyngeal swab specimens and  should not be used as a sole basis for treatment. Nasal washings and  aspirates are unacceptable for Xpert Xpress SARS-CoV-2/FLU/RSV  testing.  Fact Sheet for Patients: https://www.moore.com/  Fact Sheet for Healthcare Providers: https://www.young.biz/  This test  is not yet approved or cleared by the [place] FDA and  has been authorized for detection and/or diagnosis of SARS-CoV-2 by  FDA under an Emergency Use Authorization (EUA). This EUA will remain  in effect (meaning this test can be used) for the duration of the  Covid-19 declaration under Section 564(b)(1) of the Act, 21  U.S.C. section 360bbb-3(b)(1), unless the authorization is  terminated or revoked. Performed at [facility], [address], [place], [place] [address]   Urine Culture     Status: Abnormal   Collection Time: 05/21/20  2:54 AM   Specimen: Urine, Random  Result Value Ref Range Status   Specimen Description URINE, RANDOM  Final   Special Requests NONE  Final   Culture (A)  Final    <10,000 COLONIES/mL INSIGNIFICANT GROWTH Performed at [facility], [address], [place], [place] [address]  Report Status 05/22/2020 FINAL  Final    Radiology Reports CT HEAD WO CONTRAST  Result Date: 05/26/2020 CLINICAL DATA:  69 year old male with altered mental status. EXAM: CT HEAD WITHOUT CONTRAST TECHNIQUE: Contiguous axial images were obtained from the base of the skull through the vertex without intravenous contrast. COMPARISON:  Head CT dated 04/18/2010. FINDINGS: Brain: The ventricles and sulci appropriate size for patient's age. The gray-white matter discrimination is preserved. There is no acute intracranial hemorrhage. No mass effect midline shift. No extra-axial fluid collection. Vascular: No hyperdense vessel or unexpected calcification. Skull: Normal. Negative for fracture or focal lesion. Sinuses/Orbits: No acute finding. Other: None IMPRESSION: Unremarkable noncontrast CT of the brain. Electronically Signed   By: Elgie Collard M.D.   On: 05/26/2020 15:45   CT ANGIO CHEST PE W OR WO CONTRAST  Addendum Date: 05/21/2020   ADDENDUM REPORT: 05/21/2020 19:28 ADDENDUM: Critical Value/emergent results were called by telephone at the time of interpretation  on 05/21/2020 at 7:27 pm to provider Dr. Dierdre Highman, who verbally acknowledged these results. Of note the airspace disease in the LEFT lower lobe is progressed from CT same day. Differential remains the same. Electronically Signed   By: Genevive Bi M.D.   On: 05/21/2020 19:28   Result Date: 05/21/2020 CLINICAL DATA:  PE suspected, high probability.  COVID-19 infection EXAM: CT ANGIOGRAPHY CHEST WITH CONTRAST TECHNIQUE: Multidetector CT imaging of the chest was performed using the standard protocol during bolus administration of intravenous contrast. Multiplanar CT image reconstructions and MIPs were obtained to evaluate the vascular anatomy. CONTRAST:  75mL OMNIPAQUE IOHEXOL 300 MG/ML  SOLN COMPARISON:  None. FINDINGS: Cardiovascular: Large filling defect within the proximal LEFT lower lobe pulmonary artery. Filling defect spans the bifurcation of the LEFT lower lobe and lingular branch pulmonary branches. This this filling defect/thrombus is occlusive to the LEFT lower lobe. No LEFT upper lobe filling defects identified. No filling defect identified within the RIGHT lung pulmonary arteries. No evidence of RIGHT ventricular strain with the RV to LV ratio less than 1. Coronary artery calcification and aortic atherosclerotic calcification. Mediastinum/Nodes: No axillary or supraclavicular adenopathy. No mediastinal or hilar adenopathy. No pericardial fluid. Esophagus normal. Lungs/Pleura: There is diffuse very fine airspace disease within the LEFT lower lobe involving the majority of the LEFT lower lobe. Similar milder findings in the posterior RIGHT lower lobe and lingula. Upper Abdomen: Limited view of the liver, kidneys, pancreas are unremarkable. Normal adrenal glands. Musculoskeletal: No aggressive osseous lesion. Review of the MIP images confirms the above findings. IMPRESSION: 1. Large occlusive pulmonary embolism within the proximal LEFT lower lobe pulmonary artery and lingular pulmonary artery. 2. Diffuse  fine airspace disease within the LEFT lower lobe with differential including pulmonary infarction, pulmonary hemorrhage, or pneumonia (including COVID pneumonia). 3. No evidence of RIGHT ventricular strain. 4. Coronary artery calcification and aortic atherosclerotic calcification. Critical Value/emergent results were called by telephone at the time of interpretation on 05/21/2020 at 6:55 pm to provider [facility] , who verbally acknowledged these results. Electronically Signed: By: Genevive Bi M.D. On: 05/21/2020 18:59   IR IVC FILTER PLMT / S&I Lenise Arena GUID/MOD SED  Result Date: 05/24/2020 INDICATION: 69 year old with recent cardiac arrest likely secondary to pulmonary embolism and COVID-19. Subsequent right ventricular failure. Patient has clot in the left pulmonary arteries and clot in left femoral vein. There is concern that the patient would not tolerate additional clot burden in the pulmonary arteries and request for IVC filter placement. EXAM: IVC FILTER PLACEMENT; IVC VENOGRAM; ULTRASOUND  FOR VASCULAR ACCESS Physician: Rachelle Hora. Lowella Dandy, MD MEDICATIONS: None. ANESTHESIA/SEDATION: Versed 1 mg The patient was continuously monitored during the procedure by the interventional radiology nurse under my direct supervision. CONTRAST:  Carbon dioxide FLUOROSCOPY TIME:  Fluoroscopy Time: 3 minutes, 18 seconds, 18 mGy COMPLICATIONS: None immediate. PROCEDURE: Informed consent was obtained for an IVC filter placement from the patient's daughter. Ultrasound demonstrated a patent right internal jugular vein. Ultrasound images were obtained for documentation. The right neck was prepped and draped in a sterile fashion. Maximal barrier sterile technique was utilized including caps, mask, sterile gowns, sterile gloves, sterile drape, hand hygiene and skin antiseptic. The skin was anesthetized with 1% lidocaine. A 21 gauge needle was directed into the vein with ultrasound guidance and a micropuncture dilator set was  placed. A wire was advanced into the IVC. The filter sheath was advanced over the wire into the IVC. An IVC venogram was performed with carbon dioxide. Bilateral renal veins were cannulated using a 5 French catheter and Bentson wire. Bilateral renal veins are identified. Fluoroscopic images were obtained for documentation. A Bard Denali filter was deployed below the lowest renal vein. Post placement images of the filter were obtained. The vascular sheath was removed with manual compression. Bandage placed over the puncture site. FINDINGS: IVC was patent. Bilateral renal veins were identified. The filter was deployed below the lowest renal vein. IMPRESSION: Successful placement of a retrievable IVC filter. PLAN: This IVC filter is potentially retrievable. The patient will be assessed for filter retrieval by Interventional Radiology in approximately 8-12 weeks. Further recommendations regarding filter retrieval, continued surveillance or declaration of device permanence, will be made at that time. Electronically Signed   By: Richarda Overlie M.D.   On: 05/24/2020 17:57   DG CHEST PORT 1 VIEW  Result Date: 05/26/2020 CLINICAL DATA:  Acute respiratory failure.  Hypoxia. EXAM: PORTABLE CHEST 1 VIEW COMPARISON:  05/22/2020. FINDINGS: Interim extubation and removal of NG tube. Left IJ line in stable position. Heart size normal. Diffuse left lung infiltrate. Right base infiltrate. No prominent pleural effusion. No pneumothorax. IMPRESSION: 1. Interim extubation and removal of NG tube. Left IJ line stable position. 2. Diffuse left lung infiltrate and right base infiltrate. Electronically Signed   By: Maisie Fus  Register   On: 05/26/2020 05:50   DG CHEST PORT 1 VIEW  Result Date: 05/22/2020 CLINICAL DATA:  Central line placement, intubation EXAM: PORTABLE CHEST 1 VIEW COMPARISON:  05/21/2020 FINDINGS: Left central line has been placed with the tip in the SVC. No pneumothorax. Endotracheal tube is 5 cm above the carina.  Patchy airspace disease throughout the left lung. No focal opacity on the right. Heart is normal size. IMPRESSION: Endotracheal tube 5 cm above the carina. Left central line tip in the SVC. No pneumothorax. Stable patchy left lung airspace disease. Electronically Signed   By: Charlett Nose M.D.   On: 05/22/2020 21:06   DG Chest Port 1 View  Result Date: 05/21/2020 CLINICAL DATA:  Cough, lower back pain, has not eaten 5 days, emesis EXAM: PORTABLE CHEST 1 VIEW COMPARISON:  Radiograph 04/19/2010, CT 04/18/2010 FINDINGS: Heterogeneous opacities present predominantly along the periphery of the left lung and minimally in the right lung base as well. No pneumothorax or effusion. The aorta is calcified. The remaining cardiomediastinal contours are unremarkable. No acute osseous or soft tissue abnormality. Degenerative changes are present in the imaged spine and shoulders. Telemetry leads overlie the chest. IMPRESSION: Heterogeneous opacities throughout the lungs, greatest in the left lung periphery concerning  for pneumonia including potential viral etiology. Aortic Atherosclerosis (ICD10-I70.0). Electronically Signed   By: Kreg Shropshire M.D.   On: 05/21/2020 03:50   DG Abd Portable 1V  Result Date: 05/23/2020 CLINICAL DATA:  Encounter for orogastric tube placement. EXAM: PORTABLE ABDOMEN - 1 VIEW COMPARISON:  06/09/2009 and chest radiograph 05/22/2020 FINDINGS: Gastric tube extends into the abdomen and the tip is in the midline of the lower abdomen, likely in the distal stomach body region. Again noted are patchy densities at the left lung base. Few gas-filled loops of bowel in the abdomen. IMPRESSION: Orogastric tube in the lower abdominal midline and likely in the distal stomach body region. Electronically Signed   By: Richarda Overlie M.D.   On: 05/23/2020 12:17   ECHOCARDIOGRAM COMPLETE  Result Date: 05/23/2020    ECHOCARDIOGRAM REPORT   Patient Name:   DENE NAZIR Date of Exam: 05/23/2020 Medical Rec #:   [id number]      Height:       72.0 in Accession #:    [id number]     Weight:       142.4 lb Date of Birth:  Nov 12, 1950      BSA:          1.844 m Patient Age:    69 years       BP:           112/85 mmHg Patient Gender: M              HR:           96 bpm. Exam Location:  Inpatient Procedure: 2D Echo, Cardiac Doppler and Color Doppler STAT ECHO Indications:    I26.02 Pulmonary embolus  History:        Patient has no prior history of Echocardiogram examinations.                 Arrythmias:Cardiac Arrest; Signs/Symptoms:Dyspnea, Hypotension                 and Altered Mental Status. Covid 19 positive.  Sonographer:    Sheralyn Boatman RDCS Referring Phys: [id number] Martina Sinner  Sonographer Comments: Technically difficult study due to poor echo windows and echo performed with patient supine and on artificial respirator. IMPRESSIONS  1. Severe RV failure and cor pulmonale.  2. Abnormal septal motion and septal flattening consistent with RV pressure overload. Left ventricular ejection fraction, by estimation, is 50 to 55%. The left ventricle has low normal function. The left ventricle has no regional wall motion abnormalities. Left ventricular diastolic parameters were normal.  3. Right ventricular systolic function is severely reduced. The right ventricular size is severely enlarged. There is mildly elevated pulmonary artery systolic pressure.  4. The mitral valve is normal in structure. No evidence of mitral valve regurgitation. No evidence of mitral stenosis.  5. The aortic valve is normal in structure. Aortic valve regurgitation is not visualized. No aortic stenosis is present.  6. The inferior vena cava is dilated in size with <50% respiratory variability, suggesting right atrial pressure of 15 mmHg. FINDINGS  Left Ventricle: Abnormal septal motion and septal flattening consistent with RV pressure overload. Left ventricular ejection fraction, by estimation, is 50 to 55%. The left ventricle has low normal function. The  left ventricle has no regional wall motion abnormalities. The left ventricular internal cavity size was normal in size. There is no left ventricular hypertrophy. Left ventricular diastolic parameters were normal. Right Ventricle: The right ventricular size is severely enlarged. Right vetricular wall  thickness was not assessed. Right ventricular systolic function is severely reduced. There is mildly elevated pulmonary artery systolic pressure. The tricuspid regurgitant velocity is 2.67 m/s, and with an assumed right atrial pressure of 8 mmHg, the estimated right ventricular systolic pressure is 36.5 mmHg. Left Atrium: Left atrial size was normal in size. Right Atrium: Right atrial size was normal in size. Pericardium: There is no evidence of pericardial effusion. Mitral Valve: The mitral valve is normal in structure. No evidence of mitral valve regurgitation. No evidence of mitral valve stenosis. Tricuspid Valve: The tricuspid valve is normal in structure. Tricuspid valve regurgitation is mild . No evidence of tricuspid stenosis. Aortic Valve: The aortic valve is normal in structure. Aortic valve regurgitation is not visualized. No aortic stenosis is present. Pulmonic Valve: The pulmonic valve was normal in structure. Pulmonic valve regurgitation is not visualized. No evidence of pulmonic stenosis. Aorta: The aortic root is normal in size and structure. Venous: The inferior vena cava is dilated in size with less than 50% respiratory variability, suggesting right atrial pressure of 15 mmHg. IAS/Shunts: No atrial level shunt detected by color flow Doppler. Additional Comments: Severe RV failure and cor pulmonale.  LEFT VENTRICLE PLAX 2D LVIDd:         3.70 cm     Diastology LVIDs:         2.80 cm     LV e' medial:    4.46 cm/s LV PW:         1.10 cm     LV E/e' medial:  4.9 LV IVS:        1.10 cm     LV e' lateral:   6.97 cm/s LVOT diam:     2.10 cm     LV E/e' lateral: 3.1 LV SV:         28 LV SV Index:   15 LVOT  Area:     3.46 cm  LV Volumes (MOD) LV vol d, MOD A2C: 22.6 ml LV vol d, MOD A4C: 25.5 ml LV vol s, MOD A2C: 11.1 ml LV vol s, MOD A4C: 11.7 ml LV SV MOD A2C:     11.5 ml LV SV MOD A4C:     25.5 ml LV SV MOD BP:      13.7 ml RIGHT VENTRICLE            IVC RV S prime:     8.84 cm/s  IVC diam: 2.50 cm TAPSE (M-mode): 1.3 cm LEFT ATRIUM           Index      RIGHT ATRIUM           Index LA diam:      2.80 cm 1.52 cm/m RA Area:     14.30 cm LA Vol (A4C): 10.5 ml 5.69 ml/m RA Volume:   42.70 ml  23.15 ml/m  AORTIC VALVE LVOT Vmax:   53.80 cm/s LVOT Vmean:  41.900 cm/s LVOT VTI:    0.080 m  AORTA Ao Root diam: 3.60 cm Ao Asc diam:  3.50 cm MITRAL VALVE               TRICUSPID VALVE MV Area (PHT): 3.27 cm    TR Peak grad:   28.5 mmHg MV Decel Time: 232 msec    TR Vmax:        267.00 cm/s MV E velocity: 21.94 cm/s MV A velocity: 41.30 cm/s  SHUNTS MV E/A ratio:  0.53  Systemic VTI:  0.08 m                            Systemic Diam: 2.10 cm Charlton Haws MD Electronically signed by Charlton Haws MD Signature Date/Time: 05/23/2020/10:46:38 AM    Final    CT RENAL STONE STUDY  Result Date: 05/21/2020 CLINICAL DATA:  69 year old male with low back pain, flank pain, decreased p.o. EXAM: CT ABDOMEN AND PELVIS WITHOUT CONTRAST TECHNIQUE: Multidetector CT imaging of the abdomen and pelvis was performed following the standard protocol without IV contrast. COMPARISON:  Noncontrast CT Abdomen and Pelvis 10/28/2008. Portable chest radiograph 04/19/2010. FINDINGS: Lower chest: Patchy, irregular peribronchial and peripheral scattered pulmonary opacity at both lung bases. This seems to be new from last month. Underlying probable pulmonary hyperinflation. Some associated lung base bronchiectasis. No cavitating areas identified. No cardiomegaly, pericardial effusion or pleural effusion. Hepatobiliary: Negative noncontrast liver and gallbladder. Pancreas: Negative. Spleen: Negative. Adrenals/Urinary Tract: Bulky left  nephrolithiasis measuring 13 mm at the renal pelvis. Additional left lower pole stone. No hydronephrosis. Negative noncontrast right kidney. No hydroureter. Unremarkable urinary bladder. Incidental pelvic phleboliths. Stomach/Bowel: Decompressed large bowel. There is long segment circumferential wall thickening in the right colon, up to the 12 mm series 4, image 49) with some areas of adjacent mesenteric stranding (coronal image 43). The wall thickening gradually decreases through the transverse colon. No free air. No free fluid. The terminal ileum appear spared. Appendix seems to remain normal on coronal image 32. No dilated small bowel.  Decompressed stomach and duodenum. Vascular/Lymphatic: Normal caliber abdominal aorta. Mild calcified atherosclerosis. Vascular patency is not evaluated in the absence of IV contrast. No lymphadenopathy identified in the absence of contrast. Reproductive: Negative. Other: Previous lower abdominal ventral hernia repair with mesh. No pelvic free fluid. Musculoskeletal: Degenerative changes in the spine. No acute osseous abnormality identified. IMPRESSION: 1. In the abdomen there is circumferential bowel wall thickening and mesenteric stranding involving the right colon. Wall thickening gradually abates through the transverse colon. This is compatible with Acute Nonspecific Colitis. 2. But the lung bases are also abnormal with patchy and irregular peribronchial and peripheral pulmonary opacity suspicious for acute viral/atypical respiratory infection. No areas of cavitation to suggest septic emboli. No pleural effusion. 3. Bulky left nephrolithiasis, but no obstructive uropathy. 4. Previous lower abdominal hernia repair with mesh. Electronically Signed   By: Odessa Fleming M.D.   On: 05/21/2020 02:46   VAS [place] LOWER EXTREMITY VENOUS (DVT)  Result Date: 05/24/2020  Lower Venous DVT Study Indications: Covid-19, pulmonary embolism.  Risk Factors: Confirmed PE. Anticoagulation: Heparin.  Limitations: Body habitus, ventilation, did not perform compressions in thigh and popliteal fossa of the left lower extremity secondary to mobile thrombus, bandages and line. Comparison Study: No prior study Performing Technologist: Sherren Kerns RVS  Examination Guidelines: A complete evaluation includes B-mode imaging, spectral Doppler, color Doppler, and power Doppler as needed of all accessible portions of each vessel. Bilateral testing is considered an integral part of a complete examination. Limited examinations for reoccurring indications may be performed as noted. The reflux portion of the exam is performed with the patient in reverse Trendelenburg.  +---------+---------------+---------+-----------+----------+--------------+ RIGHT    CompressibilityPhasicitySpontaneityPropertiesThrombus Aging +---------+---------------+---------+-----------+----------+--------------+ CFV      Full           Yes      No                                  +---------+---------------+---------+-----------+----------+--------------+  SFJ      Full                                                        +---------+---------------+---------+-----------+----------+--------------+ FV Prox  Full                                                        +---------+---------------+---------+-----------+----------+--------------+ FV Mid   Full                                                        +---------+---------------+---------+-----------+----------+--------------+ FV DistalFull                                                        +---------+---------------+---------+-----------+----------+--------------+ PFV      Full                                                        +---------+---------------+---------+-----------+----------+--------------+ POP      Full           Yes      No                                   +---------+---------------+---------+-----------+----------+--------------+ PTV      Full                                                        +---------+---------------+---------+-----------+----------+--------------+ PERO     Full                                                        +---------+---------------+---------+-----------+----------+--------------+   +---------+---------------+---------+-----------+----------+-------------------+ LEFT     CompressibilityPhasicitySpontaneityPropertiesThrombus Aging      +---------+---------------+---------+-----------+----------+-------------------+ CFV                                                   not visualized  secondary to                                                              bandage/line        +---------+---------------+---------+-----------+----------+-------------------+ SFJ                                                   not visualized                                                            secondary to                                                              bandage/line        +---------+---------------+---------+-----------+----------+-------------------+ FV Prox  Full                                         patent by color     +---------+---------------+---------+-----------+----------+-------------------+ FV Mid                                                patent by color     +---------+---------------+---------+-----------+----------+-------------------+ FV Distal                                             patent by color     +---------+---------------+---------+-----------+----------+-------------------+ PFV                                                   patent by color     +---------+---------------+---------+-----------+----------+-------------------+ POP                                                    patent by color     +---------+---------------+---------+-----------+----------+-------------------+ PTV      Full                                                             +---------+---------------+---------+-----------+----------+-------------------+  PERO     Full                                                             +---------+---------------+---------+-----------+----------+-------------------+     Summary: RIGHT: - There is no evidence of deep vein thrombosis in the lower extremity.  vessels are dilated with significantly sluggish flow, throughout  LEFT: - Findings consistent with acute deep vein thrombosis involving the left femoral vein. - Acute, mobile thrombus noted in the proximal and mid femoral vein. Vessels are dilated with significantly sluggish flow, throughout.  *See table(s) above for measurements and observations. Electronically signed by Waverly Ferrari MD on 05/24/2020 at 6:21:22 PM.    Final

## 2020-05-29 NOTE — Progress Notes (Signed)
Nutrition Follow-up  DOCUMENTATION CODES:   Underweight  INTERVENTION:  Continue Tube Feeding via Cortrak:  Vital 1.5 at 55 ml/hr Pro-Source 45 mL BID free water flush Q8H  Provides 111 g of protein, 2060 kcals and 1608 mL of free water Meets 100% estimated calorie and protein needs   NUTRITION DIAGNOSIS:   Increased nutrient needs related to acute illness (COVID-19) as evidenced by estimated needs.  ongoing  GOAL:   Patient will meet greater than or equal to 90% of their needs  Will address with TF  MONITOR:   TF tolerance, Diet advancement  REASON FOR ASSESSMENT:   Malnutrition Screening Tool    ASSESSMENT:   69 yo male with no known PMH presents with N/V, weakness and back and admitted with COVID 19 pneumonia on 11/18, had PEA arrest on 11/19 and required intubation.  11/18 Admitted 11/19 PEA arrest, Intubated  11/23 self-extubated; agitated requiring Haldol 11/24 Cortrak placed (gastric tip) 11/25 Diet advanced to Dysphagia 1 with Honey Thick Liquids  RD consulted for TF initiation/management. Note that pt has been receiving Vital HP @ 64ml/hr and 30ml Prosource TF BID per the TF protocol since Cortrak was placed. RD will adjust TF orders to better meet pt's needs.   Although pt's diet was advanced yesterday, recommend keeping Cortrak in place and meeting 100% of pt's needs via TF as pts often do not have good PO intake on such a limited diet. Additionally, pt is underweight with increased nutrient needs; and the only honey thick supplement available on the formulary is Magic Cup, which only provides 290 kcal and 9 grams of protein. Pt has had 1 meal documented since having his diet advanced -- consumed ~25% of the meal.  UOP: x24 hours  Labs: CBGs 98-140 Medications: ss novolog Q4H, solumedrol, protonix   Diet Order:   Diet Order            DIET - DYS 1 Room service appropriate? No; Fluid consistency: Honey Thick  Diet effective now                  EDUCATION NEEDS:   No education needs have been identified at this time  Skin:  Skin Assessment: Skin Integrity Issues: Skin Integrity Issues:: Other (Comment) Other: puncture R throat  Last BM:  11/26 type 7  Height:   Ht Readings from Last 1 Encounters:  05/22/20 6' (1.829 m)    Weight:   Wt Readings from Last 1 Encounters:  05/29/20 61.5 kg    Ideal Body Weight:  80.9 kg  BMI:  Body mass index is 18.39 kg/m.  Estimated Nutritional Needs:   Kcal:  7124-5809 kcals  Protein:  95-130 g  Fluid:  > 2 L    Eugene Gavia, MS, RD, LDN RD pager number and weekend/on-call pager number located in [place].

## 2020-05-30 DIAGNOSIS — I2609 Other pulmonary embolism with acute cor pulmonale: Secondary | ICD-10-CM | POA: Diagnosis not present

## 2020-05-30 DIAGNOSIS — U071 COVID-19: Secondary | ICD-10-CM | POA: Diagnosis not present

## 2020-05-30 DIAGNOSIS — I824Y2 Acute embolism and thrombosis of unspecified deep veins of left proximal lower extremity: Secondary | ICD-10-CM | POA: Diagnosis not present

## 2020-05-30 DIAGNOSIS — J9601 Acute respiratory failure with hypoxia: Secondary | ICD-10-CM | POA: Diagnosis not present

## 2020-05-30 LAB — COMPREHENSIVE METABOLIC PANEL
ALT: 73 U/L — ABNORMAL HIGH (ref 0–44)
AST: 59 U/L — ABNORMAL HIGH (ref 15–41)
Albumin: 1.9 g/dL — ABNORMAL LOW (ref 3.5–5.0)
Alkaline Phosphatase: 139 U/L — ABNORMAL HIGH (ref 38–126)
Anion gap: 8 (ref 5–15)
BUN: 24 mg/dL — ABNORMAL HIGH (ref 8–23)
CO2: 25 mmol/L (ref 22–32)
Calcium: 8.4 mg/dL — ABNORMAL LOW (ref 8.9–10.3)
Chloride: 109 mmol/L (ref 98–111)
Creatinine, Ser: 0.93 mg/dL (ref 0.61–1.24)
GFR, Estimated: >60 mL/min (ref >60)
Glucose, Bld: 105 mg/dL — ABNORMAL HIGH (ref 70–99)
Potassium: 3.8 mmol/L (ref 3.5–5.1)
Sodium: 142 mmol/L (ref 135–145)
Total Bilirubin: 1 mg/dL (ref 0.3–1.2)
Total Protein: 4.8 g/dL — ABNORMAL LOW (ref 6.5–8.1)

## 2020-05-30 LAB — CBC
HCT: 34.2 % — ABNORMAL LOW (ref 39.0–52.0)
Hemoglobin: 11 g/dL — ABNORMAL LOW (ref 13.0–17.0)
MCH: 28.8 pg (ref 26.0–34.0)
MCHC: 32.2 g/dL (ref 30.0–36.0)
MCV: 89.5 fL (ref 80.0–100.0)
Platelets: 284 10*3/uL (ref 150–400)
RBC: 3.82 MIL/uL — ABNORMAL LOW (ref 4.22–5.81)
RDW: 15.1 % (ref 11.5–15.5)
WBC: 20.9 10*3/uL — ABNORMAL HIGH (ref 4.0–10.5)
nRBC: 0 % (ref 0.0–0.2)

## 2020-05-30 LAB — GLUCOSE, CAPILLARY
Glucose-Capillary: 113 mg/dL — ABNORMAL HIGH (ref 70–99)
Glucose-Capillary: 133 mg/dL — ABNORMAL HIGH (ref 70–99)
Glucose-Capillary: 145 mg/dL — ABNORMAL HIGH (ref 70–99)
Glucose-Capillary: 96 mg/dL (ref 70–99)
Glucose-Capillary: 96 mg/dL (ref 70–99)
Glucose-Capillary: 97 mg/dL (ref 70–99)

## 2020-05-30 MED ORDER — ACETAMINOPHEN 160 MG/5ML PO SOLN
650.0000 mg | Freq: Four times a day (QID) | ORAL | Status: DC | PRN
  Administered 2020-05-30 – 2020-06-12 (×4): 650 mg via ORAL
  Filled 2020-05-30 (×5): qty 20.3

## 2020-05-30 NOTE — Progress Notes (Addendum)
PROGRESS NOTE                                                                                                                                                                                                             Patient Demographics:    Dwayne Barnes, is a 69 y.o. male, DOB - [birth year], ZOX:[id number]  Outpatient Primary MD for the patient is [name], PA-C   Admit date - 05/20/2020   LOS - 9  Chief Complaint  Patient presents with  . Fatigue       Brief Narrative: Patient is a 69 y.o. male with no significant past medical history-presented to the ED on 11/17 with nausea/vomiting, weakness and back pain-further evaluation revealed COVID-19 infection and a large PE.  Patient was started on IV heparin infusion along with steroid/Remdesivir-on 11/19-patient had a cardiac arrest-was intubated during the code-total downtime of around 6 minutes before ROSC.  Patient was subsequently transferred to the ICU-unfortunately soon after arrival in the ICU-he had another cardiac arrest-and 4 rounds of CPR were provided with ROSC.  See below for further details.   COVID-19 vaccinated status: Vaccinated  Significant Events: 11/17>> Admit to [facility] for PE/COVID-19 infection 11/19>> PEA cardiac arrest x 2-given TPA-intubated-transfer to ICU 11/23>> Self extubated 11/25>> transfer to [facility]  Significant studies: 11/18>>CT renal stone: non specific colitis, GGO's, left nephrolithiasis without obstructive uropathy. 11/18>>CTA chest: large PC in LLL, GGO's LLL. 11/20 echo>> EF 50-55%, RV systolic function severely reduced. 11/21>> B/L extremity Doppler: acute DVTin left femoral vein, acute mobile thrombus in the proximal femoral vein. 11/23>>CT Head: unremarkable    COVID-19 medications: Steroids: 11/17>>11/26 Remdesivir: 11/17>> 11/22  Antibiotics: None  Microbiology data: 11/18>> urine culture: Insignificant  growth  Procedures: ETT>>11/19 - 11/23 IVC filter placement by IR>>11/21.  Consults: PCCM, IR  DVT prophylaxis: Therapeutic Lovenox    Subjective:   Much more fluent in responses-moving all 4 extremities-Per nursing staff no major issues overnight.  Diarrhea is much better after stopping tube feeds.   Assessment  & Plan :   Cardiac arrest due to large PE: S/p TPA-now on therapeutic Lovenox.  PE likely provoked by COVID-19 infection.  PCCM MD discussed with Dr. Illa Level indication for Impella support.  Continue telemetry monitoring.  Echo as above.  Large PE with DVT: Continue therapeutic anticoagulation-he is s/p TPA on 11/19 when  he had cardiac arrest.  Patient is s/p IVC filter given mobile thrombus seen on lower extremity Doppler.  Once oral intake stabilizes-we can consider transitioning to oral anticoagulation.  Acute hypoxic respiratory failure: Due to PE/cardiac arrest/COVID-19 pneumonia-self extubated on 11/23-currently stable on room air.  Acute metabolic encephalopathy/ICU delirium/possible anoxic injury: Seems to be slowly improving-although slow to respond-follows commands with redirection.  Continue to follow mental status-CT head without any acute changes.  Continue minimize all sedating medications.  Dysphagia: In the setting of cardiac arrest-seen by SLP-started on dysphagia 1 diet which per nursing staff he is tolerating well-discontinue NG tube today.  Monitor closely-await further evaluation from SLP.  AKI: Likely hemodynamically mediated-improved-monitor electrolytes.  Replete.  COVID-19 pneumonia: Completed a course of Remdesivir-an steroids.  Hypoxia is markedly improved.  Leukocytosis: Likely secondary to steroid use-no clinical evidence of bacterial infection.  Continue to follow-if develops fever-or clinical sign becomes apparent-will necessitate further work-up or initiation of antimicrobial therapy.  Fever: afebrile O2 requirements:  SpO2: 99 % O2  Flow Rate (L/min): 2 L/min FiO2 (%): 30 %   COVID-19 Labs: No results for input(s): DDIMER, FERRITIN, LDH, CRP in the last 72 hours.     Component Value Date/Time   BNP 499.2 (H) 05/23/2020 1244    No results for input(s): PROCALCITON in the last 168 hours.  Lab Results  Component Value Date   SARSCOV2NAA POSITIVE (A) 05/21/2020    Paroxysmal atrial fibrillation: appears to be back in sinus rhythm-on anticoagulation for PE.  Echo as above.    Normocytic anemia: Secondary to critical illness-stable for monitoring-no indication for PRBC transfusion.  Transaminitis: Likely secondary to COVID-19-downtrending.  Deconditioning/debility: Secondary to critical illness- PT/OT eval completed-recommendations are for SNF  GI prophylaxis: PPI  ABG:    Component Value Date/Time   PHART 7.417 05/25/2020 0506   PCO2ART 45.4 05/25/2020 0506   PO2ART 164 (H) 05/25/2020 0506   HCO3 29.2 (H) 05/25/2020 0506   TCO2 31 05/25/2020 0506   ACIDBASEDEF 2.0 05/22/2020 1948   O2SAT 99.0 05/25/2020 0506    Vent Settings: N/    Condition - Extremely Guarded  Family Communication  :  Daughter Asher Torpey [phone])  updated over the phone 11/27  Code Status :  Full Code  Diet :  Diet Order            DIET - DYS 1 Room service appropriate? No; Fluid consistency: Honey Thick  Diet effective now                  Disposition Plan  :   Status is: Inpatient  Remains inpatient appropriate because:Inpatient level of care appropriate due to severity of illness   Dispo: The patient is from: Home              Anticipated d/c is to: TBD              Anticipated d/c date is: > 3 days              Patient currently is not medically stable to d/c.   Barriers to discharge: Hypoxia requiring O2 supplementation/complete 5 days of IV Remdesivir  Antimicorbials  :    Anti-infectives (From admission, onward)   Start     Dose/Rate Route Frequency Ordered Stop   05/22/20 1000  remdesivir  100 mg in sodium chloride 0.9 % 100 mL IVPB       "Followed by" Linked Group Details   100 mg 200 mL/hr over 30  Minutes Intravenous Daily 05/21/20 0857 05/25/20 1154   05/21/20 1030  remdesivir 200 mg in sodium chloride 0.9% 250 mL IVPB  Status:  Discontinued       "Followed by" Linked Group Details   200 mg 580 mL/hr over 30 Minutes Intravenous Once 05/21/20 0857 05/27/20 0734   05/21/20 0330  metroNIDAZOLE (FLAGYL) tablet 500 mg        500 mg Oral  Once 05/21/20 0320 05/21/20 0411   05/20/20 2330  cefTRIAXone (ROCEPHIN) 2 g in sodium chloride 0.9 % 100 mL IVPB        2 g 200 mL/hr over 30 Minutes Intravenous  Once 05/20/20 2324 05/21/20 0331      Inpatient Medications  Scheduled Meds: . Chlorhexidine Gluconate Cloth  6 each Topical Daily  . enoxaparin (LOVENOX) injection  60 mg Subcutaneous Q12H  . feeding supplement (PROSource TF)  45 mL Per Tube BID  . insulin aspart  0-9 Units Subcutaneous Q4H  . pantoprazole (PROTONIX) IV  40 mg Intravenous Q24H  . sodium chloride flush  10-40 mL Intracatheter Q12H   Continuous Infusions:  PRN Meds:.acetaminophen (TYLENOL) oral liquid 160 mg/5 mL, loperamide, [DISCONTINUED] ondansetron **OR** ondansetron (ZOFRAN) IV, sodium chloride flush, sodium phosphate   Time Spent in minutes  25   See all Orders from today for further details   Dwayne Massed M.D on 05/30/2020 at 12:22 PM  To page go to [email or web address] - use universal password  [facility] -  Office  [phone]    Objective:   Vitals:   05/30/20 0900 05/30/20 1000 05/30/20 1100 05/30/20 1200  BP: (!) 137/97 120/74 138/73 138/83  Pulse: 72 78 72 71  Resp: Temp:      TempSrc:      SpO2: 98% 100% 99% 99%  Weight:      Height:        Wt Readings from Last 3 Encounters:  05/30/20 59.4 kg  12/20/14 66 kg     Intake/Output Summary (Last 24 hours) at 05/30/2020 1222 Last data filed at 05/30/2020 0600 Gross per 24 hour  Intake 130 ml  Output 950  ml  Net -820 ml     Physical Exam Gen Exam:Alert awake-not in any distress HEENT:atraumatic, normocephalic Chest: B/L clear to auscultation anteriorly CVS:S1S2 regular Abdomen:soft non tender, non distended Extremities:no edema Neurology: Non focal Skin: no rash   Data Review:    CBC Recent Labs  Lab 05/24/20 0416 05/24/20 0416 05/25/20 0500 05/25/20 0506 05/26/20 0500 05/27/20 0500 05/28/20 0811 05/29/20 0341 05/30/20 0208  WBC 24.1*   < > 12.3*   < > 14.6* 10.8* 13.7* 19.3* 20.9*  HGB 10.3*   < > 9.3*   < > 9.1* 9.3* 10.0* 10.4* 11.0*  HCT 32.0*   < > 29.8*   < > 28.6* 28.5* 30.9* 32.6* 34.2*  PLT 361   < > 270   < > 257 250 277 296 284  MCV 90.1   < > 90.6   < > 92.6 90.8 88.5 91.6 89.5  MCH 29.0   < > 28.3   < > 29.4 29.6 28.7 29.2 28.8  MCHC 32.2   < > 31.2   < > 31.8 32.6 32.4 31.9 32.2  RDW 13.9   < > 14.6   < > 14.6 14.6 14.7 15.0 15.1  LYMPHSABS 1.1  --  1.3  --  0.5*  --   --   --   --  MONOABS 1.6*  --  1.3*  --  1.6*  --   --   --   --   EOSABS 0.0  --  0.0  --  0.0  --   --   --   --   BASOSABS 0.1  --  0.0  --  0.0  --   --   --   --    < > = values in this interval not displayed.    Chemistries  Recent Labs  Lab 05/24/20 0416 05/24/20 0416 05/25/20 0500 05/25/20 0506 05/26/20 0500 05/27/20 0500 05/28/20 0811 05/29/20 0341 05/30/20 0208  NA 141   < > 143   < > 140 142 146* 144 142  K 4.4   < > 4.5   < > 3.9 4.0 3.7 3.6 3.8  CL 109   < > 110   < > 105 106 110 109 109  CO2 23   < > 26   < > 26 27 26 27 25   GLUCOSE 163*   < > 121*   < > 149* 137* 130* 124* 105*  BUN 44*   < > 41*   < > 36* 24* 29* 30* 24*  CREATININE 1.62*   < > 1.31*   < > 1.20 1.00 0.97 0.89 0.93  CALCIUM 8.4*   < > 8.5*   < > 8.7* 8.6* 8.4* 8.7* 8.4*  MG 2.3  --  2.3  --  2.3  --   --  1.9  --   AST 57*   < > 33  --  39 50*  --  49* 59*  ALT 101*   < > 71*  --  57* 48*  --  52* 73*  ALKPHOS 135*   < > 114  --  103 89  --  119 139*  BILITOT 0.8   < > 0.6  --  0.8 0.6   --  0.9 1.0   < > = values in this interval not displayed.   ------------------------------------------------------------------------------------------------------------------ No results for input(s): CHOL, HDL, LDLCALC, TRIG, CHOLHDL, LDLDIRECT in the last 72 hours.  Lab Results  Component Value Date   HGBA1C 5.5 05/22/2020   ------------------------------------------------------------------------------------------------------------------ No results for input(s): TSH, T4TOTAL, T3FREE, THYROIDAB in the last 72 hours.  Invalid input(s): FREET3 ------------------------------------------------------------------------------------------------------------------ No results for input(s): VITAMINB12, FOLATE, FERRITIN, TIBC, IRON, RETICCTPCT in the last 72 hours.  Coagulation profile No results for input(s): INR, PROTIME in the last 168 hours.  No results for input(s): DDIMER in the last 72 hours.  Cardiac Enzymes No results for input(s): CKMB, TROPONINI, MYOGLOBIN in the last 168 hours.  Invalid input(s): CK ------------------------------------------------------------------------------------------------------------------    Component Value Date/Time   BNP 499.2 (H) 05/23/2020 1244    Micro Results Recent Results (from the past 240 hour(s))  Respiratory Panel by RT PCR (Flu A&B, Covid) - Nasopharyngeal Swab     Status: Abnormal   Collection Time: 05/21/20  1:23 AM   Specimen: Nasopharyngeal Swab  Result Value Ref Range Status   SARS Coronavirus 2 by RT PCR POSITIVE (A) NEGATIVE Final    Comment: RESULT CALLED TO, READ BACK BY AND VERIFIED WITH: A. COSTALES,RN 0252 05/21/2020 T. TYSOR (NOTE) SARS-CoV-2 target nucleic acids are DETECTED.  SARS-CoV-2 RNA is generally detectable in upper respiratory specimens  during the acute phase of infection. Positive results are indicative of the presence of the identified virus, but do not rule out bacterial infection or co-infection with other  pathogens not  detected by the test. Clinical correlation with patient history and other diagnostic information is necessary to determine patient infection status. The expected result is Negative.  Fact Sheet for Patients:  https://www.moore.com/  Fact Sheet for Healthcare Providers: https://www.young.biz/  This test is not yet approved or cleared by the [place] FDA and  has been authorized for detection and/or diagnosis of SARS-CoV-2 by FDA under an Emergency Use Authorization (EUA).  This EUA will remain in effect (meaning this test ca n be used) for the duration of  the COVID-19 declaration under Section 564(b)(1) of the Act, 21 U.S.C. section 360bbb-3(b)(1), unless the authorization is terminated or revoked sooner.      Influenza A by PCR NEGATIVE NEGATIVE Final   Influenza B by PCR NEGATIVE NEGATIVE Final    Comment: (NOTE) The Xpert Xpress SARS-CoV-2/FLU/RSV assay is intended as an aid in  the diagnosis of influenza from Nasopharyngeal swab specimens and  should not be used as a sole basis for treatment. Nasal washings and  aspirates are unacceptable for Xpert Xpress SARS-CoV-2/FLU/RSV  testing.  Fact Sheet for Patients: https://www.moore.com/  Fact Sheet for Healthcare Providers: https://www.young.biz/  This test is not yet approved or cleared by the [place] FDA and  has been authorized for detection and/or diagnosis of SARS-CoV-2 by  FDA under an Emergency Use Authorization (EUA). This EUA will remain  in effect (meaning this test can be used) for the duration of the  Covid-19 declaration under Section 564(b)(1) of the Act, 21  U.S.C. section 360bbb-3(b)(1), unless the authorization is  terminated or revoked. Performed at [facility], [address], [place], [place] [address]   Urine Culture     Status: Abnormal   Collection Time: 05/21/20  2:54 AM   Specimen: Urine,  Random  Result Value Ref Range Status   Specimen Description URINE, RANDOM  Final   Special Requests NONE  Final   Culture (A)  Final    <10,000 COLONIES/mL INSIGNIFICANT GROWTH Performed at [facility], [address], [place], [place] [address]    Report Status 05/22/2020 FINAL  Final    Radiology Reports CT HEAD WO CONTRAST  Result Date: 05/26/2020 CLINICAL DATA:  68 year old male with altered mental status. EXAM: CT HEAD WITHOUT CONTRAST TECHNIQUE: Contiguous axial images were obtained from the base of the skull through the vertex without intravenous contrast. COMPARISON:  Head CT dated 04/18/2010. FINDINGS: Brain: The ventricles and sulci appropriate size for patient's age. The gray-white matter discrimination is preserved. There is no acute intracranial hemorrhage. No mass effect midline shift. No extra-axial fluid collection. Vascular: No hyperdense vessel or unexpected calcification. Skull: Normal. Negative for fracture or focal lesion. Sinuses/Orbits: No acute finding. Other: None IMPRESSION: Unremarkable noncontrast CT of the brain. Electronically Signed   By: Elgie Collard M.D.   On: 05/26/2020 15:45   CT ANGIO CHEST PE W OR WO CONTRAST  Addendum Date: 05/21/2020   ADDENDUM REPORT: 05/21/2020 19:28 ADDENDUM: Critical Value/emergent results were called by telephone at the time of interpretation on 05/21/2020 at 7:27 pm to provider Dr. Dierdre Highman, who verbally acknowledged these results. Of note the airspace disease in the LEFT lower lobe is progressed from CT same day. Differential remains the same. Electronically Signed   By: Genevive Bi M.D.   On: 05/21/2020 19:28   Result Date: 05/21/2020 CLINICAL DATA:  PE suspected, high probability.  COVID-19 infection EXAM: CT ANGIOGRAPHY CHEST WITH CONTRAST TECHNIQUE: Multidetector CT imaging of the chest was performed using  the standard protocol during bolus administration of intravenous contrast. Multiplanar CT image  reconstructions and MIPs were obtained to evaluate the vascular anatomy. CONTRAST:  75mL OMNIPAQUE IOHEXOL 300 MG/ML  SOLN COMPARISON:  None. FINDINGS: Cardiovascular: Large filling defect within the proximal LEFT lower lobe pulmonary artery. Filling defect spans the bifurcation of the LEFT lower lobe and lingular branch pulmonary branches. This this filling defect/thrombus is occlusive to the LEFT lower lobe. No LEFT upper lobe filling defects identified. No filling defect identified within the RIGHT lung pulmonary arteries. No evidence of RIGHT ventricular strain with the RV to LV ratio less than 1. Coronary artery calcification and aortic atherosclerotic calcification. Mediastinum/Nodes: No axillary or supraclavicular adenopathy. No mediastinal or hilar adenopathy. No pericardial fluid. Esophagus normal. Lungs/Pleura: There is diffuse very fine airspace disease within the LEFT lower lobe involving the majority of the LEFT lower lobe. Similar milder findings in the posterior RIGHT lower lobe and lingula. Upper Abdomen: Limited view of the liver, kidneys, pancreas are unremarkable. Normal adrenal glands. Musculoskeletal: No aggressive osseous lesion. Review of the MIP images confirms the above findings. IMPRESSION: 1. Large occlusive pulmonary embolism within the proximal LEFT lower lobe pulmonary artery and lingular pulmonary artery. 2. Diffuse fine airspace disease within the LEFT lower lobe with differential including pulmonary infarction, pulmonary hemorrhage, or pneumonia (including COVID pneumonia). 3. No evidence of RIGHT ventricular strain. 4. Coronary artery calcification and aortic atherosclerotic calcification. Critical Value/emergent results were called by telephone at the time of interpretation on 05/21/2020 at 6:55 pm to [facility] , who verbally acknowledged these results. Electronically Signed: By: Genevive BiStewart  Edmunds M.D. On: 05/21/2020 18:59   IR IVC FILTER PLMT / S&I Lenise Arena/IMG GUID/MOD  SED  Result Date: 05/24/2020 INDICATION: 69 year old with recent cardiac arrest likely secondary to pulmonary embolism and COVID-19. Subsequent right ventricular failure. Patient has clot in the left pulmonary arteries and clot in left femoral vein. There is concern that the patient would not tolerate additional clot burden in the pulmonary arteries and request for IVC filter placement. EXAM: IVC FILTER PLACEMENT; IVC VENOGRAM; ULTRASOUND FOR VASCULAR ACCESS Physician: Rachelle HoraAdam R. Lowella DandyHenn, MD MEDICATIONS: None. ANESTHESIA/SEDATION: Versed 1 mg The patient was continuously monitored during the procedure by the interventional radiology nurse under my direct supervision. CONTRAST:  Carbon dioxide FLUOROSCOPY TIME:  Fluoroscopy Time: 3 minutes, 18 seconds, 18 mGy COMPLICATIONS: None immediate. PROCEDURE: Informed consent was obtained for an IVC filter placement from the patient's daughter. Ultrasound demonstrated a patent right internal jugular vein. Ultrasound images were obtained for documentation. The right neck was prepped and draped in a sterile fashion. Maximal barrier sterile technique was utilized including caps, mask, sterile gowns, sterile gloves, sterile drape, hand hygiene and skin antiseptic. The skin was anesthetized with 1% lidocaine. A 21 gauge needle was directed into the vein with ultrasound guidance and a micropuncture dilator set was placed. A wire was advanced into the IVC. The filter sheath was advanced over the wire into the IVC. An IVC venogram was performed with carbon dioxide. Bilateral renal veins were cannulated using a 5 French catheter and Bentson wire. Bilateral renal veins are identified. Fluoroscopic images were obtained for documentation. A Bard Denali filter was deployed below the lowest renal vein. Post placement images of the filter were obtained. The vascular sheath was removed with manual compression. Bandage placed over the puncture site. FINDINGS: IVC was patent. Bilateral renal  veins were identified. The filter was deployed below the lowest renal vein. IMPRESSION: Successful placement of a retrievable IVC  filter. PLAN: This IVC filter is potentially retrievable. The patient will be assessed for filter retrieval by Interventional Radiology in approximately 8-12 weeks. Further recommendations regarding filter retrieval, continued surveillance or declaration of device permanence, will be made at that time. Electronically Signed   By: Richarda Overlie M.D.   On: 05/24/2020 17:57   DG CHEST PORT 1 VIEW  Result Date: 05/26/2020 CLINICAL DATA:  Acute respiratory failure.  Hypoxia. EXAM: PORTABLE CHEST 1 VIEW COMPARISON:  05/22/2020. FINDINGS: Interim extubation and removal of NG tube. Left IJ line in stable position. Heart size normal. Diffuse left lung infiltrate. Right base infiltrate. No prominent pleural effusion. No pneumothorax. IMPRESSION: 1. Interim extubation and removal of NG tube. Left IJ line stable position. 2. Diffuse left lung infiltrate and right base infiltrate. Electronically Signed   By: Maisie Fus  Register   On: 05/26/2020 05:50   DG CHEST PORT 1 VIEW  Result Date: 05/22/2020 CLINICAL DATA:  Central line placement, intubation EXAM: PORTABLE CHEST 1 VIEW COMPARISON:  05/21/2020 FINDINGS: Left central line has been placed with the tip in the SVC. No pneumothorax. Endotracheal tube is 5 cm above the carina. Patchy airspace disease throughout the left lung. No focal opacity on the right. Heart is normal size. IMPRESSION: Endotracheal tube 5 cm above the carina. Left central line tip in the SVC. No pneumothorax. Stable patchy left lung airspace disease. Electronically Signed   By: Charlett Nose M.D.   On: 05/22/2020 21:06   DG Chest Port 1 View  Result Date: 05/21/2020 CLINICAL DATA:  Cough, lower back pain, has not eaten 5 days, emesis EXAM: PORTABLE CHEST 1 VIEW COMPARISON:  Radiograph 04/19/2010, CT 04/18/2010 FINDINGS: Heterogeneous opacities present predominantly along  the periphery of the left lung and minimally in the right lung base as well. No pneumothorax or effusion. The aorta is calcified. The remaining cardiomediastinal contours are unremarkable. No acute osseous or soft tissue abnormality. Degenerative changes are present in the imaged spine and shoulders. Telemetry leads overlie the chest. IMPRESSION: Heterogeneous opacities throughout the lungs, greatest in the left lung periphery concerning for pneumonia including potential viral etiology. Aortic Atherosclerosis (ICD10-I70.0). Electronically Signed   By: Kreg Shropshire M.D.   On: 05/21/2020 03:50   DG Abd Portable 1V  Result Date: 05/23/2020 CLINICAL DATA:  Encounter for orogastric tube placement. EXAM: PORTABLE ABDOMEN - 1 VIEW COMPARISON:  06/09/2009 and chest radiograph 05/22/2020 FINDINGS: Gastric tube extends into the abdomen and the tip is in the midline of the lower abdomen, likely in the distal stomach body region. Again noted are patchy densities at the left lung base. Few gas-filled loops of bowel in the abdomen. IMPRESSION: Orogastric tube in the lower abdominal midline and likely in the distal stomach body region. Electronically Signed   By: Richarda Overlie M.D.   On: 05/23/2020 12:17   ECHOCARDIOGRAM COMPLETE  Result Date: 05/23/2020    ECHOCARDIOGRAM REPORT   Patient Name:   Dwayne Barnes Date of Exam: 05/23/2020 Medical Rec #:  [id number]      Height:       72.0 in Accession #:    [id number]     Weight:       142.4 lb Date of Birth:  [birth year]      BSA:          1.844 m Patient Age:    69 years       BP:           112/85 mmHg Patient Gender: M  HR:           96 bpm. Exam Location:  Inpatient Procedure: 2D Echo, Cardiac Doppler and Color Doppler STAT ECHO Indications:    I26.02 Pulmonary embolus  History:        Patient has no prior history of Echocardiogram examinations.                 Arrythmias:Cardiac Arrest; Signs/Symptoms:Dyspnea, Hypotension                 and Altered Mental  Status. Covid 19 positive.  Sonographer:    Sheralyn Boatman RDCS Referring Phys: [id number] Martina Sinner  Sonographer Comments: Technically difficult study due to poor echo windows and echo performed with patient supine and on artificial respirator. IMPRESSIONS  1. Severe RV failure and cor pulmonale.  2. Abnormal septal motion and septal flattening consistent with RV pressure overload. Left ventricular ejection fraction, by estimation, is 50 to 55%. The left ventricle has low normal function. The left ventricle has no regional wall motion abnormalities. Left ventricular diastolic parameters were normal.  3. Right ventricular systolic function is severely reduced. The right ventricular size is severely enlarged. There is mildly elevated pulmonary artery systolic pressure.  4. The mitral valve is normal in structure. No evidence of mitral valve regurgitation. No evidence of mitral stenosis.  5. The aortic valve is normal in structure. Aortic valve regurgitation is not visualized. No aortic stenosis is present.  6. The inferior vena cava is dilated in size with <50% respiratory variability, suggesting right atrial pressure of 15 mmHg. FINDINGS  Left Ventricle: Abnormal septal motion and septal flattening consistent with RV pressure overload. Left ventricular ejection fraction, by estimation, is 50 to 55%. The left ventricle has low normal function. The left ventricle has no regional wall motion abnormalities. The left ventricular internal cavity size was normal in size. There is no left ventricular hypertrophy. Left ventricular diastolic parameters were normal. Right Ventricle: The right ventricular size is severely enlarged. Right vetricular wall thickness was not assessed. Right ventricular systolic function is severely reduced. There is mildly elevated pulmonary artery systolic pressure. The tricuspid regurgitant velocity is 2.67 m/s, and with an assumed right atrial pressure of 8 mmHg, the estimated right ventricular  systolic pressure is 36.5 mmHg. Left Atrium: Left atrial size was normal in size. Right Atrium: Right atrial size was normal in size. Pericardium: There is no evidence of pericardial effusion. Mitral Valve: The mitral valve is normal in structure. No evidence of mitral valve regurgitation. No evidence of mitral valve stenosis. Tricuspid Valve: The tricuspid valve is normal in structure. Tricuspid valve regurgitation is mild . No evidence of tricuspid stenosis. Aortic Valve: The aortic valve is normal in structure. Aortic valve regurgitation is not visualized. No aortic stenosis is present. Pulmonic Valve: The pulmonic valve was normal in structure. Pulmonic valve regurgitation is not visualized. No evidence of pulmonic stenosis. Aorta: The aortic root is normal in size and structure. Venous: The inferior vena cava is dilated in size with less than 50% respiratory variability, suggesting right atrial pressure of 15 mmHg. IAS/Shunts: No atrial level shunt detected by color flow Doppler. Additional Comments: Severe RV failure and cor pulmonale.  LEFT VENTRICLE PLAX 2D LVIDd:         3.70 cm     Diastology LVIDs:         2.80 cm     LV e' medial:    4.46 cm/s LV PW:  1.10 cm     LV E/e' medial:  4.9 LV IVS:        1.10 cm     LV e' lateral:   6.97 cm/s LVOT diam:     2.10 cm     LV E/e' lateral: 3.1 LV SV:         28 LV SV Index:   15 LVOT Area:     3.46 cm  LV Volumes (MOD) LV vol d, MOD A2C: 22.6 ml LV vol d, MOD A4C: 25.5 ml LV vol s, MOD A2C: 11.1 ml LV vol s, MOD A4C: 11.7 ml LV SV MOD A2C:     11.5 ml LV SV MOD A4C:     25.5 ml LV SV MOD BP:      13.7 ml RIGHT VENTRICLE            IVC RV S prime:     8.84 cm/s  IVC diam: 2.50 cm TAPSE (M-mode): 1.3 cm LEFT ATRIUM           Index      RIGHT ATRIUM           Index LA diam:      2.80 cm 1.52 cm/m RA Area:     14.30 cm LA Vol (A4C): 10.5 ml 5.69 ml/m RA Volume:   42.70 ml  23.15 ml/m  AORTIC VALVE LVOT Vmax:   53.80 cm/s LVOT Vmean:  41.900 cm/s LVOT VTI:     0.080 m  AORTA Ao Root diam: 3.60 cm Ao Asc diam:  3.50 cm MITRAL VALVE               TRICUSPID VALVE MV Area (PHT): 3.27 cm    TR Peak grad:   28.5 mmHg MV Decel Time: 232 msec    TR Vmax:        267.00 cm/s MV E velocity: 21.94 cm/s MV A velocity: 41.30 cm/s  SHUNTS MV E/A ratio:  0.53        Systemic VTI:  0.08 m                            Systemic Diam: 2.10 cm Charlton Haws MD Electronically signed by Charlton Haws MD Signature Date/Time: 05/23/2020/10:46:38 AM    Final    CT RENAL STONE STUDY  Result Date: 05/21/2020 CLINICAL DATA:  69 year old male with low back pain, flank pain, decreased p.o. EXAM: CT ABDOMEN AND PELVIS WITHOUT CONTRAST TECHNIQUE: Multidetector CT imaging of the abdomen and pelvis was performed following the standard protocol without IV contrast. COMPARISON:  Noncontrast CT Abdomen and Pelvis 10/28/2008. Portable chest radiograph 04/19/2010. FINDINGS: Lower chest: Patchy, irregular peribronchial and peripheral scattered pulmonary opacity at both lung bases. This seems to be new from last month. Underlying probable pulmonary hyperinflation. Some associated lung base bronchiectasis. No cavitating areas identified. No cardiomegaly, pericardial effusion or pleural effusion. Hepatobiliary: Negative noncontrast liver and gallbladder. Pancreas: Negative. Spleen: Negative. Adrenals/Urinary Tract: Bulky left nephrolithiasis measuring 13 mm at the renal pelvis. Additional left lower pole stone. No hydronephrosis. Negative noncontrast right kidney. No hydroureter. Unremarkable urinary bladder. Incidental pelvic phleboliths. Stomach/Bowel: Decompressed large bowel. There is long segment circumferential wall thickening in the right colon, up to the 12 mm series 4, image 49) with some areas of adjacent mesenteric stranding (coronal image 43). The wall thickening gradually decreases through the transverse colon. No free air. No free fluid. The terminal ileum appear spared. Appendix seems  to remain  normal on coronal image 32. No dilated small bowel.  Decompressed stomach and duodenum. Vascular/Lymphatic: Normal caliber abdominal aorta. Mild calcified atherosclerosis. Vascular patency is not evaluated in the absence of IV contrast. No lymphadenopathy identified in the absence of contrast. Reproductive: Negative. Other: Previous lower abdominal ventral hernia repair with mesh. No pelvic free fluid. Musculoskeletal: Degenerative changes in the spine. No acute osseous abnormality identified. IMPRESSION: 1. In the abdomen there is circumferential bowel wall thickening and mesenteric stranding involving the right colon. Wall thickening gradually abates through the transverse colon. This is compatible with Acute Nonspecific Colitis. 2. But the lung bases are also abnormal with patchy and irregular peribronchial and peripheral pulmonary opacity suspicious for acute viral/atypical respiratory infection. No areas of cavitation to suggest septic emboli. No pleural effusion. 3. Bulky left nephrolithiasis, but no obstructive uropathy. 4. Previous lower abdominal hernia repair with mesh. Electronically Signed   By: Odessa Fleming M.D.   On: 05/21/2020 02:46   VAS [place] LOWER EXTREMITY VENOUS (DVT)  Result Date: 05/24/2020  Lower Venous DVT Study Indications: Covid-19, pulmonary embolism.  Risk Factors: Confirmed PE. Anticoagulation: Heparin. Limitations: Body habitus, ventilation, did not perform compressions in thigh and popliteal fossa of the left lower extremity secondary to mobile thrombus, bandages and line. Comparison Study: No prior study Performing Technologist: Sherren Kerns RVS  Examination Guidelines: A complete evaluation includes B-mode imaging, spectral Doppler, color Doppler, and power Doppler as needed of all accessible portions of each vessel. Bilateral testing is considered an integral part of a complete examination. Limited examinations for reoccurring indications may be performed as noted. The reflux portion  of the exam is performed with the patient in reverse Trendelenburg.  +---------+---------------+---------+-----------+----------+--------------+ RIGHT    CompressibilityPhasicitySpontaneityPropertiesThrombus Aging +---------+---------------+---------+-----------+----------+--------------+ CFV      Full           Yes      No                                  +---------+---------------+---------+-----------+----------+--------------+ SFJ      Full                                                        +---------+---------------+---------+-----------+----------+--------------+ FV Prox  Full                                                        +---------+---------------+---------+-----------+----------+--------------+ FV Mid   Full                                                        +---------+---------------+---------+-----------+----------+--------------+ FV DistalFull                                                        +---------+---------------+---------+-----------+----------+--------------+  PFV      Full                                                        +---------+---------------+---------+-----------+----------+--------------+ POP      Full           Yes      No                                  +---------+---------------+---------+-----------+----------+--------------+ PTV      Full                                                        +---------+---------------+---------+-----------+----------+--------------+ PERO     Full                                                        +---------+---------------+---------+-----------+----------+--------------+   +---------+---------------+---------+-----------+----------+-------------------+ LEFT     CompressibilityPhasicitySpontaneityPropertiesThrombus Aging      +---------+---------------+---------+-----------+----------+-------------------+ CFV                                                    not visualized                                                            secondary to                                                              bandage/line        +---------+---------------+---------+-----------+----------+-------------------+ SFJ                                                   not visualized                                                            secondary to  bandage/line        +---------+---------------+---------+-----------+----------+-------------------+ FV Prox  Full                                         patent by color     +---------+---------------+---------+-----------+----------+-------------------+ FV Mid                                                patent by color     +---------+---------------+---------+-----------+----------+-------------------+ FV Distal                                             patent by color     +---------+---------------+---------+-----------+----------+-------------------+ PFV                                                   patent by color     +---------+---------------+---------+-----------+----------+-------------------+ POP                                                   patent by color     +---------+---------------+---------+-----------+----------+-------------------+ PTV      Full                                                             +---------+---------------+---------+-----------+----------+-------------------+ PERO     Full                                                             +---------+---------------+---------+-----------+----------+-------------------+     Summary: RIGHT: - There is no evidence of deep vein thrombosis in the lower extremity.  vessels are dilated with significantly sluggish flow, throughout  LEFT: - Findings consistent with acute deep vein  thrombosis involving the left femoral vein. - Acute, mobile thrombus noted in the proximal and mid femoral vein. Vessels are dilated with significantly sluggish flow, throughout.  *See table(s) above for measurements and observations. Electronically signed by Waverly Ferrari MD on 05/24/2020 at 6:21:22 PM.    Final

## 2020-05-30 NOTE — Progress Notes (Signed)
  Speech Language Pathology Treatment: Dysphagia  Patient Details Name: DESMON HITCHNER MRN: [id number] DOB: [birth year] Today's Date: 05/30/2020 Time: 9806-9996 SLP Time Calculation (min) (ACUTE ONLY): 14 min  Assessment / Plan / Recommendation Clinical Impression  Pt has had NG removed; alert and more communicative.  Voice and cough are stronger.  Trialed with thin liquids and regular solids. Pt demonstrated mild difficulty with mastication due to absence of teeth, but it was functional. He drank three oz of thin water with no s/s of aspiration and was very happy.  He consumed three more oz with excellent toleration.  Dysphagia appears to have resolved.  Recommend dysphagia 3 due to being edentulous; allow thin liquids. No further SLP f/u needed; will sign off. D/W RN.   HPI HPI: DAWSYN RAMSARAN is a 69 y.o. male who was admitted 11/18 with N/V, weakness and found to have pyuria, positive COVID test and LLL PE. Code blue for cardiac arrest. Intubated May 25, 2010/23 (self textubated). Coded again once in ICU.       SLP Plan  All goals met       Recommendations  Diet recommendations: Thin liquid;Dysphagia 3 (mechanical soft) Liquids provided via: Cup;Straw Medication Administration: Whole meds with puree Supervision: Patient able to self feed Postural Changes and/or Swallow Maneuvers: Seated upright 90 degrees                Oral Care Recommendations: Oral care BID Follow up Recommendations: None SLP Visit Diagnosis: Dysphagia, unspecified (R13.10) Plan: All goals met       GO                Juan Quam Laurice 05/30/2020, 3:28 PM  Jourdin Gens L. Tivis Ringer, [place] Office number [phone] Pager [phone]

## 2020-05-31 DIAGNOSIS — I2609 Other pulmonary embolism with acute cor pulmonale: Secondary | ICD-10-CM | POA: Diagnosis not present

## 2020-05-31 DIAGNOSIS — I824Y2 Acute embolism and thrombosis of unspecified deep veins of left proximal lower extremity: Secondary | ICD-10-CM | POA: Diagnosis not present

## 2020-05-31 DIAGNOSIS — U071 COVID-19: Secondary | ICD-10-CM | POA: Diagnosis not present

## 2020-05-31 DIAGNOSIS — J9601 Acute respiratory failure with hypoxia: Secondary | ICD-10-CM | POA: Diagnosis not present

## 2020-05-31 LAB — BASIC METABOLIC PANEL
Anion gap: 10 (ref 5–15)
BUN: 20 mg/dL (ref 8–23)
CO2: 24 mmol/L (ref 22–32)
Calcium: 8.1 mg/dL — ABNORMAL LOW (ref 8.9–10.3)
Chloride: 102 mmol/L (ref 98–111)
Creatinine, Ser: 1.04 mg/dL (ref 0.61–1.24)
GFR, Estimated: >60 mL/min (ref >60)
Glucose, Bld: 105 mg/dL — ABNORMAL HIGH (ref 70–99)
Potassium: 3.7 mmol/L (ref 3.5–5.1)
Sodium: 136 mmol/L (ref 135–145)

## 2020-05-31 LAB — CBC
HCT: 34.2 % — ABNORMAL LOW (ref 39.0–52.0)
Hemoglobin: 11.2 g/dL — ABNORMAL LOW (ref 13.0–17.0)
MCH: 28.9 pg (ref 26.0–34.0)
MCHC: 32.7 g/dL (ref 30.0–36.0)
MCV: 88.1 fL (ref 80.0–100.0)
Platelets: 246 10*3/uL (ref 150–400)
RBC: 3.88 MIL/uL — ABNORMAL LOW (ref 4.22–5.81)
RDW: 15.2 % (ref 11.5–15.5)
WBC: 24.1 10*3/uL — ABNORMAL HIGH (ref 4.0–10.5)
nRBC: 0 % (ref 0.0–0.2)

## 2020-05-31 LAB — GLUCOSE, CAPILLARY
Glucose-Capillary: 104 mg/dL — ABNORMAL HIGH (ref 70–99)
Glucose-Capillary: 99 mg/dL (ref 70–99)
Glucose-Capillary: 128 mg/dL — ABNORMAL HIGH (ref 70–99)
Glucose-Capillary: 84 mg/dL (ref 70–99)
Glucose-Capillary: 85 mg/dL (ref 70–99)

## 2020-05-31 MED ORDER — APIXABAN 5 MG PO TABS
10.0000 mg | ORAL_TABLET | Freq: Two times a day (BID) | ORAL | Status: AC
  Administered 2020-05-31 – 2020-06-06 (×14): 10 mg via ORAL
  Filled 2020-05-31 (×14): qty 2

## 2020-05-31 MED ORDER — APIXABAN 5 MG PO TABS
5.0000 mg | ORAL_TABLET | Freq: Two times a day (BID) | ORAL | Status: DC
  Administered 2020-06-07 – 2020-06-15 (×18): 5 mg via ORAL
  Filled 2020-05-31 (×18): qty 1

## 2020-05-31 NOTE — Progress Notes (Signed)
ANTICOAGULATION CONSULT NOTE - Initial Consult  Pharmacy Consult for lovenox>>eliquis Indication: pulmonary embolus and DVT  No Known Allergies  Patient Measurements: Height: 6' (182.9 cm) Weight: 62.3 kg (137 lb 5.6 oz) IBW/kg (Calculated) : 77.6   Vital Signs: Temp: 99 F (37.2 C) (11/28 0329) Temp Source: Oral (11/28 0329) BP: 122/62 (11/28 0400) Pulse Rate: 82 (11/28 0700)  Labs: Recent Labs    05/29/20 0341 05/29/20 0341 05/30/20 0208 05/31/20 0247  HGB 10.4*   < > 11.0* 11.2*  HCT 32.6*  --  34.2* 34.2*  PLT 296  --  284 246  CREATININE 0.89  --  0.93 1.04   < > = values in this interval not displayed.    Estimated Creatinine Clearance: 59.9 mL/min (by C-G formula based on SCr of 1.04 mg/dL).   Medical History: Past Medical History:  Diagnosis Date  . COVID-19 05/21/2020  . PE (pulmonary thromboembolism) (HCC) 05/2020     Assessment: 69 yo male with PE s/p tenecteplsae for PEA cardiac arrest who is now on therapeutic dose lovenox. Pharmacy consulted to transition patient from lovenox to eliquis for oral anticoagulation. Lovenox last given at 0030 on 11/28.   CBC: Hgb 11.2 and platelet 246.    Goal of Therapy:  Monitor platelets by anticoagulation protocol: Yes   Plan:  Discontinue lovenox Begin eliquis at 1200 on 11/28 using the following dosing regimen. Begin eliquis by mouth 10mg  BID x7 days then eliquis by mouth 5mg  BID thereafter for DVT treatment  Monitor s/sx bleeding, CBC, and clinical progress   PGY1 pharmacy resident 05/31/2020,11:26 AM

## 2020-05-31 NOTE — Progress Notes (Signed)
Pt is in no distress. Will recheck vitals within the hour. Charge nurse Lowella Bandy, RN notified.

## 2020-05-31 NOTE — Progress Notes (Signed)
PROGRESS NOTE                                                                                                                                                                                                             Patient Demographics:    Dwayne Barnes, is a 69 y.o. male, DOB - July 15, 1950, WUJ:[id number]  Outpatient Primary MD for the patient is [name], PA-C   Admit date - 05/20/2020   LOS - 10  Chief Complaint  Patient presents with  . Fatigue       Brief Narrative: Patient is a 69 y.o. male with no significant past medical history-presented to the ED on 11/17 with nausea/vomiting, weakness and back pain-further evaluation revealed COVID-19 infection and a large PE.  Patient was started on IV heparin infusion along with steroid/Remdesivir-on 11/19-patient had a cardiac arrest-was intubated during the code-total downtime of around 6 minutes before ROSC.  Patient was subsequently transferred to the ICU-unfortunately soon after arrival in the ICU-he had another cardiac arrest-and 4 rounds of CPR were provided with ROSC.  See below for further details.   COVID-19 vaccinated status: Vaccinated  Significant Events: 11/17>> Admit to [facility] for PE/COVID-19 infection 11/19>> PEA cardiac arrest x 2-given TPA-intubated-transfer to ICU 11/23>> Self extubated 11/25>> transfer to [facility]  Significant studies: 11/18>>CT renal stone: non specific colitis, GGO's, left nephrolithiasis without obstructive uropathy. 11/18>>CTA chest: large PC in LLL, GGO's LLL. 11/20 echo>> EF 50-55%, RV systolic function severely reduced. 11/21>> B/L extremity Doppler: acute DVTin left femoral vein, acute mobile thrombus in the proximal femoral vein. 11/23>>CT Head: unremarkable    COVID-19 medications: Steroids: 11/17>>11/26 Remdesivir: 11/17>> 11/22  Antibiotics: None  Microbiology data: 11/18>> urine culture: Insignificant  growth  Procedures: ETT>>11/19 - 11/23 IVC filter placement by IR>>11/21.  Consults: PCCM, IR  DVT prophylaxis: Therapeutic Lovenox>> Eliquis    Subjective:   Awake/alert-no major issues.Denies diarrhea-denies abdominal pain.   Assessment  & Plan :   Cardiac arrest due to large PE: S/p TPA-was on therapeutic Lovenox-we will transition to Eliquis today. PE likely provoked by COVID-19 infection.  PCCM MD discussed with Dr. Illa Level indication for Impella support.  Continue telemetry monitoring.  Echo as above.  Large PE with DVT: Continue therapeutic anticoagulation-he is s/p TPA on 11/19 when he had cardiac arrest.  Patient is s/p IVC filter given mobile  thrombus seen on lower extremity Doppler.  Since oral intake has stabilized-we will transition to Eliquis today.  Acute hypoxic respiratory failure: Due to PE/cardiac arrest/COVID-19 pneumonia-self extubated on 11/23-currently stable on room air.  Acute metabolic encephalopathy/ICU delirium/possible anoxic injury: Seems to be slowly improving-although slow to respond-follows commands with redirection.  Continue to follow mental status-CT head without any acute changes.  Continue minimize all sedating medications.  Dysphagia: In the setting of cardiac arrest-seen by SLP-required NG tube-subsequently seen by SLP-diet gradually upgraded to a dysphagia 3 diet.  AKI: Likely hemodynamically mediated-improved-monitor electrolytes.  Replete.  COVID-19 pneumonia: Completed a course of Remdesivir-an steroids.  Hypoxia has resolved.  Leukocytosis: Likely secondary to steroid use-no clinical evidence of bacterial infection.  Continue to follow-if develops fever-or clinical sign becomes apparent-will necessitate further work-up or initiation of antimicrobial therapy.  Fever: afebrile O2 requirements:  SpO2: 100 % O2 Flow Rate (L/min): 2 L/min FiO2 (%): 30 %   COVID-19 Labs: No results for input(s): DDIMER, FERRITIN, LDH, CRP in the  last 72 hours.     Component Value Date/Time   BNP 499.2 (H) 05/23/2020 1244    No results for input(s): PROCALCITON in the last 168 hours.  Lab Results  Component Value Date   SARSCOV2NAA POSITIVE (A) 05/21/2020    Paroxysmal atrial fibrillation: appears to be back in sinus rhythm-on anticoagulation for PE.  Echo as above.    Normocytic anemia: Secondary to critical illness-stable for monitoring-no indication for PRBC transfusion.  Transaminitis: Likely secondary to COVID-19-downtrending.  Deconditioning/debility: Secondary to critical illness- PT/OT eval completed-recommendations are for SNF  GI prophylaxis: PPI  ABG:    Component Value Date/Time   PHART 7.417 05/25/2020 0506   PCO2ART 45.4 05/25/2020 0506   PO2ART 164 (H) 05/25/2020 0506   HCO3 29.2 (H) 05/25/2020 0506   TCO2 31 05/25/2020 0506   ACIDBASEDEF 2.0 05/22/2020 1948   O2SAT 99.0 05/25/2020 0506    Vent Settings: N/    Condition - Extremely Guarded  Family Communication  :  Daughter Stormy Sabol [phone])  updated over the phone 11/28  Code Status :  Full Code  Diet :  Diet Order            DIET DYS 3 Room service appropriate? Yes; Fluid consistency: Thin  Diet effective now                  Disposition Plan  :   Status is: Inpatient  Remains inpatient appropriate because:Inpatient level of care appropriate due to severity of illness   Dispo: The patient is from: Home              Anticipated d/c is to: TBD              Anticipated d/c date is: > 3 days              Patient currently is not medically stable to d/c.   Barriers to discharge: Hypoxia requiring O2 supplementation/complete 5 days of IV Remdesivir  Antimicorbials  :    Anti-infectives (From admission, onward)   Start     Dose/Rate Route Frequency Ordered Stop   05/22/20 1000  remdesivir 100 mg in sodium chloride 0.9 % 100 mL IVPB       "Followed by" Linked Group Details   100 mg 200 mL/hr over 30 Minutes  Intravenous Daily 05/21/20 0857 05/25/20 1154   05/21/20 1030  remdesivir 200 mg in sodium chloride 0.9% 250 mL IVPB  Status:  Discontinued       "Followed by" Linked Group Details   200 mg 580 mL/hr over 30 Minutes Intravenous Once 05/21/20 0857 05/27/20 0734   05/21/20 0330  metroNIDAZOLE (FLAGYL) tablet 500 mg        500 mg Oral  Once 05/21/20 0320 05/21/20 0411   05/20/20 2330  cefTRIAXone (ROCEPHIN) 2 g in sodium chloride 0.9 % 100 mL IVPB        2 g 200 mL/hr over 30 Minutes Intravenous  Once 05/20/20 2324 05/21/20 0331      Inpatient Medications  Scheduled Meds: . Chlorhexidine Gluconate Cloth  6 each Topical Daily  . enoxaparin (LOVENOX) injection  60 mg Subcutaneous Q12H  . insulin aspart  0-9 Units Subcutaneous Q4H  . pantoprazole (PROTONIX) IV  40 mg Intravenous Q24H  . sodium chloride flush  10-40 mL Intracatheter Q12H   Continuous Infusions:  PRN Meds:.acetaminophen (TYLENOL) oral liquid 160 mg/5 mL, loperamide, [DISCONTINUED] ondansetron **OR** ondansetron (ZOFRAN) IV, sodium chloride flush   Time Spent in minutes  25   See all Orders from today for further details   Jeoffrey Massed M.D on 05/31/2020 at 11:15 AM  To page go to [email or web address] - use universal password  [facility] -  Office  [phone]    Objective:   Vitals:   05/31/20 0400 05/31/20 0500 05/31/20 0600 05/31/20 0700  BP: 122/62     Pulse: 82 87 86 82  Resp: 19 19 19  (!) 23  Temp:      TempSrc:      SpO2: 99% 100% 100% 100%  Weight:      Height:        Wt Readings from Last 3 Encounters:  05/31/20 62.3 kg  12/20/14 66 kg     Intake/Output Summary (Last 24 hours) at 05/31/2020 1115 Last data filed at 05/31/2020 0300 Gross per 24 hour  Intake 200 ml  Output 1250 ml  Net -1050 ml     Physical Exam Gen Exam:Alert awake-not in any distress HEENT:atraumatic, normocephalic Chest: B/L clear to auscultation anteriorly CVS:S1S2 regular Abdomen:soft non tender, non  distended Extremities:no edema Neurology: Non focal Skin: no rash   Data Review:    CBC Recent Labs  Lab 05/25/20 0500 05/25/20 0506 05/26/20 0500 05/26/20 0500 05/27/20 0500 05/28/20 0811 05/29/20 0341 05/30/20 0208 05/31/20 0247  WBC 12.3*   < > 14.6*   < > 10.8* 13.7* 19.3* 20.9* 24.1*  HGB 9.3*   < > 9.1*   < > 9.3* 10.0* 10.4* 11.0* 11.2*  HCT 29.8*   < > 28.6*   < > 28.5* 30.9* 32.6* 34.2* 34.2*  PLT 270   < > 257   < > 250 277 296 284 246  MCV 90.6   < > 92.6   < > 90.8 88.5 91.6 89.5 88.1  MCH 28.3   < > 29.4   < > 29.6 28.7 29.2 28.8 28.9  MCHC 31.2   < > 31.8   < > 32.6 32.4 31.9 32.2 32.7  RDW 14.6   < > 14.6   < > 14.6 14.7 15.0 15.1 15.2  LYMPHSABS 1.3  --  0.5*  --   --   --   --   --   --   MONOABS 1.3*  --  1.6*  --   --   --   --   --   --   EOSABS 0.0  --  0.0  --   --   --   --   --   --  BASOSABS 0.0  --  0.0  --   --   --   --   --   --    < > = values in this interval not displayed.    Chemistries  Recent Labs  Lab 05/25/20 0500 05/25/20 0506 05/26/20 0500 05/26/20 0500 05/27/20 0500 05/28/20 0811 05/29/20 0341 05/30/20 0208 05/31/20 0247  NA 143   < > 140   < > 142 146* 144 142 136  K 4.5   < > 3.9   < > 4.0 3.7 3.6 3.8 3.7  CL 110   < > 105   < > 106 110 109 109 102  CO2 26   < > 26   < > 27 26 27 25 24   GLUCOSE 121*   < > 149*   < > 137* 130* 124* 105* 105*  BUN 41*   < > 36*   < > 24* 29* 30* 24* 20  CREATININE 1.31*   < > 1.20   < > 1.00 0.97 0.89 0.93 1.04  CALCIUM 8.5*   < > 8.7*   < > 8.6* 8.4* 8.7* 8.4* 8.1*  MG 2.3  --  2.3  --   --   --  1.9  --   --   AST 33  --  39  --  50*  --  49* 59*  --   ALT 71*  --  57*  --  48*  --  52* 73*  --   ALKPHOS 114  --  103  --  89  --  119 139*  --   BILITOT 0.6  --  0.8  --  0.6  --  0.9 1.0  --    < > = values in this interval not displayed.   ------------------------------------------------------------------------------------------------------------------ No results for input(s):  CHOL, HDL, LDLCALC, TRIG, CHOLHDL, LDLDIRECT in the last 72 hours.  Lab Results  Component Value Date   HGBA1C 5.5 05/22/2020   ------------------------------------------------------------------------------------------------------------------ No results for input(s): TSH, T4TOTAL, T3FREE, THYROIDAB in the last 72 hours.  Invalid input(s): FREET3 ------------------------------------------------------------------------------------------------------------------ No results for input(s): VITAMINB12, FOLATE, FERRITIN, TIBC, IRON, RETICCTPCT in the last 72 hours.  Coagulation profile No results for input(s): INR, PROTIME in the last 168 hours.  No results for input(s): DDIMER in the last 72 hours.  Cardiac Enzymes No results for input(s): CKMB, TROPONINI, MYOGLOBIN in the last 168 hours.  Invalid input(s): CK ------------------------------------------------------------------------------------------------------------------    Component Value Date/Time   BNP 499.2 (H) 05/23/2020 1244    Micro Results No results found for this or any previous visit (from the past 240 hour(s)).  Radiology Reports CT HEAD WO CONTRAST  Result Date: 05/26/2020 CLINICAL DATA:  69 year old male with altered mental status. EXAM: CT HEAD WITHOUT CONTRAST TECHNIQUE: Contiguous axial images were obtained from the base of the skull through the vertex without intravenous contrast. COMPARISON:  Head CT dated 04/18/2010. FINDINGS: Brain: The ventricles and sulci appropriate size for patient's age. The gray-white matter discrimination is preserved. There is no acute intracranial hemorrhage. No mass effect midline shift. No extra-axial fluid collection. Vascular: No hyperdense vessel or unexpected calcification. Skull: Normal. Negative for fracture or focal lesion. Sinuses/Orbits: No acute finding. Other: None IMPRESSION: Unremarkable noncontrast CT of the brain. Electronically Signed   By: Elgie CollardArash  Radparvar M.D.   On:  05/26/2020 15:45   CT ANGIO CHEST PE W OR WO CONTRAST  Addendum Date: 05/21/2020   ADDENDUM REPORT: 05/21/2020 19:28 ADDENDUM: Critical Value/emergent results  were called by telephone at the time of interpretation on 05/21/2020 at 7:27 pm to provider Dr. Dierdre Highman, who verbally acknowledged these results. Of note the airspace disease in the LEFT lower lobe is progressed from CT same day. Differential remains the same. Electronically Signed   By: Genevive Bi M.D.   On: 05/21/2020 19:28   Result Date: 05/21/2020 CLINICAL DATA:  PE suspected, high probability.  COVID-19 infection EXAM: CT ANGIOGRAPHY CHEST WITH CONTRAST TECHNIQUE: Multidetector CT imaging of the chest was performed using the standard protocol during bolus administration of intravenous contrast. Multiplanar CT image reconstructions and MIPs were obtained to evaluate the vascular anatomy. CONTRAST:  75mL OMNIPAQUE IOHEXOL 300 MG/ML  SOLN COMPARISON:  None. FINDINGS: Cardiovascular: Large filling defect within the proximal LEFT lower lobe pulmonary artery. Filling defect spans the bifurcation of the LEFT lower lobe and lingular branch pulmonary branches. This this filling defect/thrombus is occlusive to the LEFT lower lobe. No LEFT upper lobe filling defects identified. No filling defect identified within the RIGHT lung pulmonary arteries. No evidence of RIGHT ventricular strain with the RV to LV ratio less than 1. Coronary artery calcification and aortic atherosclerotic calcification. Mediastinum/Nodes: No axillary or supraclavicular adenopathy. No mediastinal or hilar adenopathy. No pericardial fluid. Esophagus normal. Lungs/Pleura: There is diffuse very fine airspace disease within the LEFT lower lobe involving the majority of the LEFT lower lobe. Similar milder findings in the posterior RIGHT lower lobe and lingula. Upper Abdomen: Limited view of the liver, kidneys, pancreas are unremarkable. Normal adrenal glands. Musculoskeletal: No  aggressive osseous lesion. Review of the MIP images confirms the above findings. IMPRESSION: 1. Large occlusive pulmonary embolism within the proximal LEFT lower lobe pulmonary artery and lingular pulmonary artery. 2. Diffuse fine airspace disease within the LEFT lower lobe with differential including pulmonary infarction, pulmonary hemorrhage, or pneumonia (including COVID pneumonia). 3. No evidence of RIGHT ventricular strain. 4. Coronary artery calcification and aortic atherosclerotic calcification. Critical Value/emergent results were called by telephone at the time of interpretation on 05/21/2020 at 6:55 pm to provider [facility] , who verbally acknowledged these results. Electronically Signed: By: Genevive Bi M.D. On: 05/21/2020 18:59   IR IVC FILTER PLMT / S&I Lenise Arena GUID/MOD SED  Result Date: 05/24/2020 INDICATION: 69 year old with recent cardiac arrest likely secondary to pulmonary embolism and COVID-19. Subsequent right ventricular failure. Patient has clot in the left pulmonary arteries and clot in left femoral vein. There is concern that the patient would not tolerate additional clot burden in the pulmonary arteries and request for IVC filter placement. EXAM: IVC FILTER PLACEMENT; IVC VENOGRAM; ULTRASOUND FOR VASCULAR ACCESS Physician: Rachelle Hora. Lowella Dandy, MD MEDICATIONS: None. ANESTHESIA/SEDATION: Versed 1 mg The patient was continuously monitored during the procedure by the interventional radiology nurse under my direct supervision. CONTRAST:  Carbon dioxide FLUOROSCOPY TIME:  Fluoroscopy Time: 3 minutes, 18 seconds, 18 mGy COMPLICATIONS: None immediate. PROCEDURE: Informed consent was obtained for an IVC filter placement from the patient's daughter. Ultrasound demonstrated a patent right internal jugular vein. Ultrasound images were obtained for documentation. The right neck was prepped and draped in a sterile fashion. Maximal barrier sterile technique was utilized including caps, mask, sterile  gowns, sterile gloves, sterile drape, hand hygiene and skin antiseptic. The skin was anesthetized with 1% lidocaine. A 21 gauge needle was directed into the vein with ultrasound guidance and a micropuncture dilator set was placed. A wire was advanced into the IVC. The filter sheath was advanced over the wire into the IVC. An IVC venogram  was performed with carbon dioxide. Bilateral renal veins were cannulated using a 5 French catheter and Bentson wire. Bilateral renal veins are identified. Fluoroscopic images were obtained for documentation. A Bard Denali filter was deployed below the lowest renal vein. Post placement images of the filter were obtained. The vascular sheath was removed with manual compression. Bandage placed over the puncture site. FINDINGS: IVC was patent. Bilateral renal veins were identified. The filter was deployed below the lowest renal vein. IMPRESSION: Successful placement of a retrievable IVC filter. PLAN: This IVC filter is potentially retrievable. The patient will be assessed for filter retrieval by Interventional Radiology in approximately 8-12 weeks. Further recommendations regarding filter retrieval, continued surveillance or declaration of device permanence, will be made at that time. Electronically Signed   By: Richarda Overlie M.D.   On: 05/24/2020 17:57   DG CHEST PORT 1 VIEW  Result Date: 05/26/2020 CLINICAL DATA:  Acute respiratory failure.  Hypoxia. EXAM: PORTABLE CHEST 1 VIEW COMPARISON:  05/22/2020. FINDINGS: Interim extubation and removal of NG tube. Left IJ line in stable position. Heart size normal. Diffuse left lung infiltrate. Right base infiltrate. No prominent pleural effusion. No pneumothorax. IMPRESSION: 1. Interim extubation and removal of NG tube. Left IJ line stable position. 2. Diffuse left lung infiltrate and right base infiltrate. Electronically Signed   By: Maisie Fus  Register   On: 05/26/2020 05:50   DG CHEST PORT 1 VIEW  Result Date: 05/22/2020 CLINICAL DATA:   Central line placement, intubation EXAM: PORTABLE CHEST 1 VIEW COMPARISON:  05/21/2020 FINDINGS: Left central line has been placed with the tip in the SVC. No pneumothorax. Endotracheal tube is 5 cm above the carina. Patchy airspace disease throughout the left lung. No focal opacity on the right. Heart is normal size. IMPRESSION: Endotracheal tube 5 cm above the carina. Left central line tip in the SVC. No pneumothorax. Stable patchy left lung airspace disease. Electronically Signed   By: Charlett Nose M.D.   On: 05/22/2020 21:06   DG Chest Port 1 View  Result Date: 05/21/2020 CLINICAL DATA:  Cough, lower back pain, has not eaten 5 days, emesis EXAM: PORTABLE CHEST 1 VIEW COMPARISON:  Radiograph 04/19/2010, CT 04/18/2010 FINDINGS: Heterogeneous opacities present predominantly along the periphery of the left lung and minimally in the right lung base as well. No pneumothorax or effusion. The aorta is calcified. The remaining cardiomediastinal contours are unremarkable. No acute osseous or soft tissue abnormality. Degenerative changes are present in the imaged spine and shoulders. Telemetry leads overlie the chest. IMPRESSION: Heterogeneous opacities throughout the lungs, greatest in the left lung periphery concerning for pneumonia including potential viral etiology. Aortic Atherosclerosis (ICD10-I70.0). Electronically Signed   By: Kreg Shropshire M.D.   On: 05/21/2020 03:50   DG Abd Portable 1V  Result Date: 05/23/2020 CLINICAL DATA:  Encounter for orogastric tube placement. EXAM: PORTABLE ABDOMEN - 1 VIEW COMPARISON:  06/09/2009 and chest radiograph 05/22/2020 FINDINGS: Gastric tube extends into the abdomen and the tip is in the midline of the lower abdomen, likely in the distal stomach body region. Again noted are patchy densities at the left lung base. Few gas-filled loops of bowel in the abdomen. IMPRESSION: Orogastric tube in the lower abdominal midline and likely in the distal stomach body region.  Electronically Signed   By: Richarda Overlie M.D.   On: 05/23/2020 12:17   ECHOCARDIOGRAM COMPLETE  Result Date: 05/23/2020    ECHOCARDIOGRAM REPORT   Patient Name:   EARLAND REISH Date of Exam: 05/23/2020 Medical Rec #:  [id number]      Height:       72.0 in Accession #:    [id number]     Weight:       142.4 lb Date of Birth:  February 28, 1951      BSA:          1.844 m Patient Age:    69 years       BP:           112/85 mmHg Patient Gender: M              HR:           96 bpm. Exam Location:  Inpatient Procedure: 2D Echo, Cardiac Doppler and Color Doppler STAT ECHO Indications:    I26.02 Pulmonary embolus  History:        Patient has no prior history of Echocardiogram examinations.                 Arrythmias:Cardiac Arrest; Signs/Symptoms:Dyspnea, Hypotension                 and Altered Mental Status. Covid 19 positive.  Sonographer:    Sheralyn Boatman RDCS Referring Phys: [id number] Martina Sinner  Sonographer Comments: Technically difficult study due to poor echo windows and echo performed with patient supine and on artificial respirator. IMPRESSIONS  1. Severe RV failure and cor pulmonale.  2. Abnormal septal motion and septal flattening consistent with RV pressure overload. Left ventricular ejection fraction, by estimation, is 50 to 55%. The left ventricle has low normal function. The left ventricle has no regional wall motion abnormalities. Left ventricular diastolic parameters were normal.  3. Right ventricular systolic function is severely reduced. The right ventricular size is severely enlarged. There is mildly elevated pulmonary artery systolic pressure.  4. The mitral valve is normal in structure. No evidence of mitral valve regurgitation. No evidence of mitral stenosis.  5. The aortic valve is normal in structure. Aortic valve regurgitation is not visualized. No aortic stenosis is present.  6. The inferior vena cava is dilated in size with <50% respiratory variability, suggesting right atrial pressure of 15 mmHg.  FINDINGS  Left Ventricle: Abnormal septal motion and septal flattening consistent with RV pressure overload. Left ventricular ejection fraction, by estimation, is 50 to 55%. The left ventricle has low normal function. The left ventricle has no regional wall motion abnormalities. The left ventricular internal cavity size was normal in size. There is no left ventricular hypertrophy. Left ventricular diastolic parameters were normal. Right Ventricle: The right ventricular size is severely enlarged. Right vetricular wall thickness was not assessed. Right ventricular systolic function is severely reduced. There is mildly elevated pulmonary artery systolic pressure. The tricuspid regurgitant velocity is 2.67 m/s, and with an assumed right atrial pressure of 8 mmHg, the estimated right ventricular systolic pressure is 36.5 mmHg. Left Atrium: Left atrial size was normal in size. Right Atrium: Right atrial size was normal in size. Pericardium: There is no evidence of pericardial effusion. Mitral Valve: The mitral valve is normal in structure. No evidence of mitral valve regurgitation. No evidence of mitral valve stenosis. Tricuspid Valve: The tricuspid valve is normal in structure. Tricuspid valve regurgitation is mild . No evidence of tricuspid stenosis. Aortic Valve: The aortic valve is normal in structure. Aortic valve regurgitation is not visualized. No aortic stenosis is present. Pulmonic Valve: The pulmonic valve was normal in structure. Pulmonic valve regurgitation is not visualized. No evidence of pulmonic stenosis. Aorta: The aortic root is normal  in size and structure. Venous: The inferior vena cava is dilated in size with less than 50% respiratory variability, suggesting right atrial pressure of 15 mmHg. IAS/Shunts: No atrial level shunt detected by color flow Doppler. Additional Comments: Severe RV failure and cor pulmonale.  LEFT VENTRICLE PLAX 2D LVIDd:         3.70 cm     Diastology LVIDs:         2.80 cm      LV e' medial:    4.46 cm/s LV PW:         1.10 cm     LV E/e' medial:  4.9 LV IVS:        1.10 cm     LV e' lateral:   6.97 cm/s LVOT diam:     2.10 cm     LV E/e' lateral: 3.1 LV SV:         28 LV SV Index:   15 LVOT Area:     3.46 cm  LV Volumes (MOD) LV vol d, MOD A2C: 22.6 ml LV vol d, MOD A4C: 25.5 ml LV vol s, MOD A2C: 11.1 ml LV vol s, MOD A4C: 11.7 ml LV SV MOD A2C:     11.5 ml LV SV MOD A4C:     25.5 ml LV SV MOD BP:      13.7 ml RIGHT VENTRICLE            IVC RV S prime:     8.84 cm/s  IVC diam: 2.50 cm TAPSE (M-mode): 1.3 cm LEFT ATRIUM           Index      RIGHT ATRIUM           Index LA diam:      2.80 cm 1.52 cm/m RA Area:     14.30 cm LA Vol (A4C): 10.5 ml 5.69 ml/m RA Volume:   42.70 ml  23.15 ml/m  AORTIC VALVE LVOT Vmax:   53.80 cm/s LVOT Vmean:  41.900 cm/s LVOT VTI:    0.080 m  AORTA Ao Root diam: 3.60 cm Ao Asc diam:  3.50 cm MITRAL VALVE               TRICUSPID VALVE MV Area (PHT): 3.27 cm    TR Peak grad:   28.5 mmHg MV Decel Time: 232 msec    TR Vmax:        267.00 cm/s MV E velocity: 21.94 cm/s MV A velocity: 41.30 cm/s  SHUNTS MV E/A ratio:  0.53        Systemic VTI:  0.08 m                            Systemic Diam: 2.10 cm Charlton Haws MD Electronically signed by Charlton Haws MD Signature Date/Time: 05/23/2020/10:46:38 AM    Final    CT RENAL STONE STUDY  Result Date: 05/21/2020 CLINICAL DATA:  69 year old male with low back pain, flank pain, decreased p.o. EXAM: CT ABDOMEN AND PELVIS WITHOUT CONTRAST TECHNIQUE: Multidetector CT imaging of the abdomen and pelvis was performed following the standard protocol without IV contrast. COMPARISON:  Noncontrast CT Abdomen and Pelvis 10/28/2008. Portable chest radiograph 04/19/2010. FINDINGS: Lower chest: Patchy, irregular peribronchial and peripheral scattered pulmonary opacity at both lung bases. This seems to be new from last month. Underlying probable pulmonary hyperinflation. Some associated lung base bronchiectasis. No cavitating  areas identified. No cardiomegaly, pericardial effusion or  pleural effusion. Hepatobiliary: Negative noncontrast liver and gallbladder. Pancreas: Negative. Spleen: Negative. Adrenals/Urinary Tract: Bulky left nephrolithiasis measuring 13 mm at the renal pelvis. Additional left lower pole stone. No hydronephrosis. Negative noncontrast right kidney. No hydroureter. Unremarkable urinary bladder. Incidental pelvic phleboliths. Stomach/Bowel: Decompressed large bowel. There is long segment circumferential wall thickening in the right colon, up to the 12 mm series 4, image 49) with some areas of adjacent mesenteric stranding (coronal image 43). The wall thickening gradually decreases through the transverse colon. No free air. No free fluid. The terminal ileum appear spared. Appendix seems to remain normal on coronal image 32. No dilated small bowel.  Decompressed stomach and duodenum. Vascular/Lymphatic: Normal caliber abdominal aorta. Mild calcified atherosclerosis. Vascular patency is not evaluated in the absence of IV contrast. No lymphadenopathy identified in the absence of contrast. Reproductive: Negative. Other: Previous lower abdominal ventral hernia repair with mesh. No pelvic free fluid. Musculoskeletal: Degenerative changes in the spine. No acute osseous abnormality identified. IMPRESSION: 1. In the abdomen there is circumferential bowel wall thickening and mesenteric stranding involving the right colon. Wall thickening gradually abates through the transverse colon. This is compatible with Acute Nonspecific Colitis. 2. But the lung bases are also abnormal with patchy and irregular peribronchial and peripheral pulmonary opacity suspicious for acute viral/atypical respiratory infection. No areas of cavitation to suggest septic emboli. No pleural effusion. 3. Bulky left nephrolithiasis, but no obstructive uropathy. 4. Previous lower abdominal hernia repair with mesh. Electronically Signed   By: Odessa Fleming M.D.   On:  05/21/2020 02:46   VAS [place] LOWER EXTREMITY VENOUS (DVT)  Result Date: 05/24/2020  Lower Venous DVT Study Indications: Covid-19, pulmonary embolism.  Risk Factors: Confirmed PE. Anticoagulation: Heparin. Limitations: Body habitus, ventilation, did not perform compressions in thigh and popliteal fossa of the left lower extremity secondary to mobile thrombus, bandages and line. Comparison Study: No prior study Performing Technologist: Sherren Kerns RVS  Examination Guidelines: A complete evaluation includes B-mode imaging, spectral Doppler, color Doppler, and power Doppler as needed of all accessible portions of each vessel. Bilateral testing is considered an integral part of a complete examination. Limited examinations for reoccurring indications may be performed as noted. The reflux portion of the exam is performed with the patient in reverse Trendelenburg.  +---------+---------------+---------+-----------+----------+--------------+ RIGHT    CompressibilityPhasicitySpontaneityPropertiesThrombus Aging +---------+---------------+---------+-----------+----------+--------------+ CFV      Full           Yes      No                                  +---------+---------------+---------+-----------+----------+--------------+ SFJ      Full                                                        +---------+---------------+---------+-----------+----------+--------------+ FV Prox  Full                                                        +---------+---------------+---------+-----------+----------+--------------+ FV Mid   Full                                                        +---------+---------------+---------+-----------+----------+--------------+  FV DistalFull                                                        +---------+---------------+---------+-----------+----------+--------------+ PFV      Full                                                         +---------+---------------+---------+-----------+----------+--------------+ POP      Full           Yes      No                                  +---------+---------------+---------+-----------+----------+--------------+ PTV      Full                                                        +---------+---------------+---------+-----------+----------+--------------+ PERO     Full                                                        +---------+---------------+---------+-----------+----------+--------------+   +---------+---------------+---------+-----------+----------+-------------------+ LEFT     CompressibilityPhasicitySpontaneityPropertiesThrombus Aging      +---------+---------------+---------+-----------+----------+-------------------+ CFV                                                   not visualized                                                            secondary to                                                              bandage/line        +---------+---------------+---------+-----------+----------+-------------------+ SFJ                                                   not visualized  secondary to                                                              bandage/line        +---------+---------------+---------+-----------+----------+-------------------+ FV Prox  Full                                         patent by color     +---------+---------------+---------+-----------+----------+-------------------+ FV Mid                                                patent by color     +---------+---------------+---------+-----------+----------+-------------------+ FV Distal                                             patent by color     +---------+---------------+---------+-----------+----------+-------------------+ PFV                                                    patent by color     +---------+---------------+---------+-----------+----------+-------------------+ POP                                                   patent by color     +---------+---------------+---------+-----------+----------+-------------------+ PTV      Full                                                             +---------+---------------+---------+-----------+----------+-------------------+ PERO     Full                                                             +---------+---------------+---------+-----------+----------+-------------------+     Summary: RIGHT: - There is no evidence of deep vein thrombosis in the lower extremity.  vessels are dilated with significantly sluggish flow, throughout  LEFT: - Findings consistent with acute deep vein thrombosis involving the left femoral vein. - Acute, mobile thrombus noted in the proximal and mid femoral vein. Vessels are dilated with significantly sluggish flow, throughout.  *See table(s) above for measurements and observations. Electronically signed by Waverly Ferrari MD on 05/24/2020 at 6:21:22 PM.    Final

## 2020-06-01 ENCOUNTER — Inpatient Hospital Stay (HOSPITAL_COMMUNITY): Payer: Medicare HMO

## 2020-06-01 DIAGNOSIS — U071 COVID-19: Secondary | ICD-10-CM | POA: Diagnosis not present

## 2020-06-01 DIAGNOSIS — J9601 Acute respiratory failure with hypoxia: Secondary | ICD-10-CM | POA: Diagnosis not present

## 2020-06-01 DIAGNOSIS — I824Y2 Acute embolism and thrombosis of unspecified deep veins of left proximal lower extremity: Secondary | ICD-10-CM | POA: Diagnosis not present

## 2020-06-01 DIAGNOSIS — I2609 Other pulmonary embolism with acute cor pulmonale: Secondary | ICD-10-CM | POA: Diagnosis not present

## 2020-06-01 LAB — CBC
HCT: 30.9 % — ABNORMAL LOW (ref 39.0–52.0)
Hemoglobin: 10.3 g/dL — ABNORMAL LOW (ref 13.0–17.0)
MCH: 29 pg (ref 26.0–34.0)
MCHC: 33.3 g/dL (ref 30.0–36.0)
MCV: 87 fL (ref 80.0–100.0)
Platelets: 236 10*3/uL (ref 150–400)
RBC: 3.55 MIL/uL — ABNORMAL LOW (ref 4.22–5.81)
RDW: 14.8 % (ref 11.5–15.5)
WBC: 24.7 10*3/uL — ABNORMAL HIGH (ref 4.0–10.5)
nRBC: 0 % (ref 0.0–0.2)

## 2020-06-01 LAB — GLUCOSE, CAPILLARY
Glucose-Capillary: 98 mg/dL (ref 70–99)
Glucose-Capillary: 121 mg/dL — ABNORMAL HIGH (ref 70–99)
Glucose-Capillary: 115 mg/dL — ABNORMAL HIGH (ref 70–99)
Glucose-Capillary: 123 mg/dL — ABNORMAL HIGH (ref 70–99)
Glucose-Capillary: 122 mg/dL — ABNORMAL HIGH (ref 70–99)

## 2020-06-01 MED ORDER — IOHEXOL 9 MG/ML PO SOLN
ORAL | Status: AC
  Administered 2020-06-01: 500 mL
  Filled 2020-06-01: qty 1000

## 2020-06-01 MED ORDER — ENSURE ENLIVE PO LIQD
237.0000 mL | Freq: Three times a day (TID) | ORAL | Status: DC
  Administered 2020-06-01 – 2020-06-25 (×66): 237 mL via ORAL
  Filled 2020-06-01 (×3): qty 237

## 2020-06-01 MED ORDER — IOHEXOL 12 MG/ML PO SOLN
500.0000 mL | ORAL | Status: AC
  Administered 2020-06-01 (×2): 500 mL via ORAL

## 2020-06-01 NOTE — Progress Notes (Signed)
Nutrition Follow-up  DOCUMENTATION CODES:   Underweight  INTERVENTION:  Provide Ensure Enlive po TID, each supplement provides 350 kcal and 20 grams of protein.  Encourage adequate PO intake.   NUTRITION DIAGNOSIS:   Increased nutrient needs related to acute illness (COVID-19) as evidenced by estimated needs; ongoing  GOAL:   Patient will meet greater than or equal to 90% of their needs; progressing  MONITOR:   PO intake, Supplement acceptance, Diet advancement, Skin, Weight trends, I & O's, Labs  REASON FOR ASSESSMENT:   Malnutrition Screening Tool    ASSESSMENT:   69 yo male with no known PMH presents with N/V, weakness and back and admitted with COVID 19 pneumonia on 11/18, had PEA arrest on 11/19 and required intubation.  11/18 Admitted 11/19 PEA arrest, Intubated 11/23 self-extubated; agitated requiring Haldol 11/24 Cortrak placed (gastric tip) 11/25 Diet advanced to Dysphagia 1 with Honey Thick Liquids 11/26 Tube feeds discontinued, Cortrak NGT out   Pt is currently on a dysphagia 3 diet with thin liquids. Meal completion has been 65-75%. RD to order nutritional supplements to aid in caloric and protein needs. Pt encouraged to eat his food at meals to drink his supplements.   Labs and medications reviewed.   Diet Order:   Diet Order            DIET DYS 3 Room service appropriate? Yes; Fluid consistency: Thin  Diet effective now                 EDUCATION NEEDS:   No education needs have been identified at this time  Skin:  Skin Assessment: Reviewed RN Assessment Skin Integrity Issues:: Other (Comment) Other: puncture R throat  Last BM:  11/29  Height:   Ht Readings from Last 1 Encounters:  05/22/20 6' (1.829 m)    Weight:   Wt Readings from Last 1 Encounters:  06/01/20 59.5 kg    Ideal Body Weight:  80.9 kg  BMI:  Body mass index is 17.79 kg/m.  Estimated Nutritional Needs:   Kcal:  2671-2458 kcals  Protein:  95-130 g  Fluid:   > 2 L  Roslyn Smiling, MS, RD, LDN RD pager number/after hours weekend pager number on Amion.

## 2020-06-01 NOTE — Progress Notes (Signed)
PROGRESS NOTE                                                                                                                                                                                                             Patient Demographics:    Dwayne Barnes, is a 69 y.o. male, DOB - [birth year], EXH:[id number]  Outpatient Primary MD for the patient is [name], PA-C   Admit date - 05/20/2020   LOS - 11  Chief Complaint  Patient presents with  . Fatigue       Brief Narrative: Patient is a 69 y.o. male with no significant past medical history-presented to the ED on 11/17 with nausea/vomiting, weakness and back pain-further evaluation revealed COVID-19 infection and a large PE.  Patient was started on IV heparin infusion along with steroid/Remdesivir-on 11/19-patient had a cardiac arrest-was intubated during the code-total downtime of around 6 minutes before ROSC.  Patient was subsequently transferred to the ICU-unfortunately soon after arrival in the ICU-he had another cardiac arrest-and 4 rounds of CPR were provided with ROSC.  See below for further details.   COVID-19 vaccinated status: Vaccinated  Significant Events: 11/17>> Admit to [facility] for PE/COVID-19 infection 11/19>> PEA cardiac arrest x 2-given TPA-intubated-transfer to ICU 11/23>> Self extubated 11/25>> transfer to [facility]  Significant studies: 11/18>>CT renal stone: non specific colitis, GGO's, left nephrolithiasis without obstructive uropathy. 11/18>>CTA chest: large PC in LLL, GGO's LLL. 11/20 echo>> EF 50-55%, RV systolic function severely reduced. 11/21>> B/L extremity Doppler: acute DVTin left femoral vein, acute mobile thrombus in the proximal femoral vein. 11/23>>CT Head: unremarkable    COVID-19 medications: Steroids: 11/17>>11/26 Remdesivir: 11/17>> 11/22  Antibiotics: None  Microbiology data: 11/18>> urine culture: Insignificant  growth  Procedures: ETT>>11/19 - 11/23 IVC filter placement by IR>>11/21.  Consults: PCCM, IR  DVT prophylaxis: Therapeutic Lovenox>> Eliquis    Subjective:   Complains of left lower quadrant abdominal pain-Per nursing staff not much diarrhea yesterday but this morning has had 3 loose stools.  Remains awake and alert-is very hard of hearing.   Assessment  & Plan :   Cardiac arrest due to large PE: S/p TPA-was on therapeutic Lovenox-we will transition to Eliquis today. PE likely provoked by COVID-19 infection.  PCCM MD discussed with Dr. Illa Level indication for Impella support.  Continue telemetry monitoring.  Echo as above.  Large  PE with DVT: Continue therapeutic anticoagulation-he is s/p TPA on 11/19 when he had cardiac arrest.  Patient is s/p IVC filter given mobile thrombus seen on lower extremity Doppler.  Once oral intake was stabilized-he was transitioned to Eliquis.  Acute hypoxic respiratory failure: Due to PE/cardiac arrest/COVID-19 pneumonia-self extubated on 11/23-currently stable on room air.  Acute metabolic encephalopathy/ICU delirium/possible anoxic injury: Mental status continues to slowly improve-getting more fluent with responses.  CT head without acute changes.  Continue supportive care.  Minimize all sedating medications.    Dysphagia: In the setting of cardiac arrest-seen by SLP-required NG tube-subsequently seen by SLP-diet gradually upgraded to a dysphagia 3 diet.  AKI: Likely hemodynamically mediated-improved-monitor electrolytes.  Replete.  COVID-19 pneumonia: Completed a course of Remdesivir-an steroids.  Hypoxia has resolved.  Leukocytosis-?diarrhea: Leukocytosis was thought to be due to steroid use-however continues to have persistent leukocytosis in spite of steroid being discontinued.  He is now having some left lower quadrant abdominal pain as well this morning.  No diarrhea yesterday but starting to have more diarrhea this morning as well (had  diarrhea while in the ICU-was not persistent-thought to be due to tube feeds).  We will go and get a CT abdomen-if diarrhea persists-we will pursue stool studies.   Paroxysmal atrial fibrillation: appears to be back in sinus rhythm-on anticoagulation for PE.  Echo as above.    Normocytic anemia: Secondary to critical illness-stable for monitoring-no indication for PRBC transfusion.  Transaminitis: Likely secondary to COVID-19-downtrending.  Deconditioning/debility: Secondary to critical illness- PT/OT eval completed-recommendations are for SNF  GI prophylaxis: PPI  ABG:    Component Value Date/Time   PHART 7.417 05/25/2020 0506   PCO2ART 45.4 05/25/2020 0506   PO2ART 164 (H) 05/25/2020 0506   HCO3 29.2 (H) 05/25/2020 0506   TCO2 31 05/25/2020 0506   ACIDBASEDEF 2.0 05/22/2020 1948   O2SAT 99.0 05/25/2020 0506    Vent Settings: N/    Condition - Extremely Guarded  Family Communication  :  Daughter Aiken Withem [phone])  updated over the phone 11/29  Code Status :  Full Code  Diet :  Diet Order            DIET DYS 3 Room service appropriate? Yes; Fluid consistency: Thin  Diet effective now                  Disposition Plan  :   Status is: Inpatient  Remains inpatient appropriate because:Inpatient level of care appropriate due to severity of illness   Dispo: The patient is from: Home              Anticipated d/c is to: TBD              Anticipated d/c date is: > 3 days              Patient currently is not medically stable to d/c.   Barriers to discharge: Hypoxia requiring O2 supplementation/complete 5 days of IV Remdesivir  Antimicorbials  :    Anti-infectives (From admission, onward)   Start     Dose/Rate Route Frequency Ordered Stop   05/22/20 1000  remdesivir 100 mg in sodium chloride 0.9 % 100 mL IVPB       "Followed by" Linked Group Details   100 mg 200 mL/hr over 30 Minutes Intravenous Daily 05/21/20 0857 05/25/20 1154   05/21/20 1030   remdesivir 200 mg in sodium chloride 0.9% 250 mL IVPB  Status:  Discontinued       "  Followed by" Linked Group Details   200 mg 580 mL/hr over 30 Minutes Intravenous Once 05/21/20 0857 05/27/20 0734   05/21/20 0330  metroNIDAZOLE (FLAGYL) tablet 500 mg        500 mg Oral  Once 05/21/20 0320 05/21/20 0411   05/20/20 2330  cefTRIAXone (ROCEPHIN) 2 g in sodium chloride 0.9 % 100 mL IVPB        2 g 200 mL/hr over 30 Minutes Intravenous  Once 05/20/20 2324 05/21/20 0331      Inpatient Medications  Scheduled Meds: . apixaban  10 mg Oral BID   Followed by  . [START ON 06/07/2020] apixaban  5 mg Oral BID  . Chlorhexidine Gluconate Cloth  6 each Topical Daily  . insulin aspart  0-9 Units Subcutaneous Q4H  . pantoprazole (PROTONIX) IV  40 mg Intravenous Q24H  . sodium chloride flush  10-40 mL Intracatheter Q12H   Continuous Infusions:  PRN Meds:.acetaminophen (TYLENOL) oral liquid 160 mg/5 mL, loperamide, [DISCONTINUED] ondansetron **OR** ondansetron (ZOFRAN) IV, sodium chloride flush   Time Spent in minutes  25   See all Orders from today for further details   Jeoffrey Massed M.D on 06/01/2020 at 9:57 AM  To page go to [email or web address] - use universal password  [facility] -  Office  [phone]    Objective:   Vitals:   05/31/20 2300 06/01/20 0109 06/01/20 0459 06/01/20 0731  BP: 96/62 (!) 92/54 (!) 89/56 98/63  Pulse: 92 91 88 91  Resp: (!) 21 20 18 19   Temp: 98.7 F (37.1 C) 98.6 F (37 C) 98.6 F (37 C) 98.3 F (36.8 C)  TempSrc: Oral Oral Oral Oral  SpO2: 100% 100% 100% 100%  Weight:   59.5 kg   Height:        Wt Readings from Last 3 Encounters:  06/01/20 59.5 kg  12/20/14 66 kg     Intake/Output Summary (Last 24 hours) at 06/01/2020 0957 Last data filed at 06/01/2020 0859 Gross per 24 hour  Intake 300 ml  Output 500 ml  Net -200 ml     Physical Exam Gen Exam:Alert awake-not in any distress HEENT:atraumatic, normocephalic Chest: B/L clear to  auscultation anteriorly CVS:S1S2 regular Abdomen: Soft-some mild tenderness in the left lower quadrant-without any rebound. Extremities:no edema Neurology: Non focal Skin: no rash   Data Review:    CBC Recent Labs  Lab 05/26/20 0500 05/27/20 0500 05/28/20 0811 05/29/20 0341 05/30/20 0208 05/31/20 0247 06/01/20 0319  WBC 14.6*   < > 13.7* 19.3* 20.9* 24.1* 24.7*  HGB 9.1*   < > 10.0* 10.4* 11.0* 11.2* 10.3*  HCT 28.6*   < > 30.9* 32.6* 34.2* 34.2* 30.9*  PLT 257   < > 277 296 284 246 236  MCV 92.6   < > 88.5 91.6 89.5 88.1 87.0  MCH 29.4   < > 28.7 29.2 28.8 28.9 29.0  MCHC 31.8   < > 32.4 31.9 32.2 32.7 33.3  RDW 14.6   < > 14.7 15.0 15.1 15.2 14.8  LYMPHSABS 0.5*  --   --   --   --   --   --   MONOABS 1.6*  --   --   --   --   --   --   EOSABS 0.0  --   --   --   --   --   --   BASOSABS 0.0  --   --   --   --   --   --    < > =  values in this interval not displayed.    Chemistries  Recent Labs  Lab 05/26/20 0500 05/26/20 0500 05/27/20 0500 05/28/20 0811 05/29/20 0341 05/30/20 0208 05/31/20 0247  NA 140   < > 142 146* 144 142 136  K 3.9   < > 4.0 3.7 3.6 3.8 3.7  CL 105   < > 106 110 109 109 102  CO2 26   < > 27 26 27 25 24   GLUCOSE 149*   < > 137* 130* 124* 105* 105*  BUN 36*   < > 24* 29* 30* 24* 20  CREATININE 1.20   < > 1.00 0.97 0.89 0.93 1.04  CALCIUM 8.7*   < > 8.6* 8.4* 8.7* 8.4* 8.1*  MG 2.3  --   --   --  1.9  --   --   AST 39  --  50*  --  49* 59*  --   ALT 57*  --  48*  --  52* 73*  --   ALKPHOS 103  --  89  --  119 139*  --   BILITOT 0.8  --  0.6  --  0.9 1.0  --    < > = values in this interval not displayed.   ------------------------------------------------------------------------------------------------------------------ No results for input(s): CHOL, HDL, LDLCALC, TRIG, CHOLHDL, LDLDIRECT in the last 72 hours.  Lab Results  Component Value Date   HGBA1C 5.5 05/22/2020    ------------------------------------------------------------------------------------------------------------------ No results for input(s): TSH, T4TOTAL, T3FREE, THYROIDAB in the last 72 hours.  Invalid input(s): FREET3 ------------------------------------------------------------------------------------------------------------------ No results for input(s): VITAMINB12, FOLATE, FERRITIN, TIBC, IRON, RETICCTPCT in the last 72 hours.  Coagulation profile No results for input(s): INR, PROTIME in the last 168 hours.  No results for input(s): DDIMER in the last 72 hours.  Cardiac Enzymes No results for input(s): CKMB, TROPONINI, MYOGLOBIN in the last 168 hours.  Invalid input(s): CK ------------------------------------------------------------------------------------------------------------------    Component Value Date/Time   BNP 499.2 (H) 05/23/2020 1244    Micro Results No results found for this or any previous visit (from the past 240 hour(s)).  Radiology Reports CT HEAD WO CONTRAST  Result Date: 05/26/2020 CLINICAL DATA:  69 year old male with altered mental status. EXAM: CT HEAD WITHOUT CONTRAST TECHNIQUE: Contiguous axial images were obtained from the base of the skull through the vertex without intravenous contrast. COMPARISON:  Head CT dated 04/18/2010. FINDINGS: Brain: The ventricles and sulci appropriate size for patient's age. The gray-white matter discrimination is preserved. There is no acute intracranial hemorrhage. No mass effect midline shift. No extra-axial fluid collection. Vascular: No hyperdense vessel or unexpected calcification. Skull: Normal. Negative for fracture or focal lesion. Sinuses/Orbits: No acute finding. Other: None IMPRESSION: Unremarkable noncontrast CT of the brain. Electronically Signed   By: Elgie Collard M.D.   On: 05/26/2020 15:45   CT ANGIO CHEST PE W OR WO CONTRAST  Addendum Date: 05/21/2020   ADDENDUM REPORT: 05/21/2020 19:28 ADDENDUM:  Critical Value/emergent results were called by telephone at the time of interpretation on 05/21/2020 at 7:27 pm to provider Dr. Dierdre Highman, who verbally acknowledged these results. Of note the airspace disease in the LEFT lower lobe is progressed from CT same day. Differential remains the same. Electronically Signed   By: Genevive Bi M.D.   On: 05/21/2020 19:28   Result Date: 05/21/2020 CLINICAL DATA:  PE suspected, high probability.  COVID-19 infection EXAM: CT ANGIOGRAPHY CHEST WITH CONTRAST TECHNIQUE: Multidetector CT imaging of the chest was performed using the standard protocol during bolus  administration of intravenous contrast. Multiplanar CT image reconstructions and MIPs were obtained to evaluate the vascular anatomy. CONTRAST:  75mL OMNIPAQUE IOHEXOL 300 MG/ML  SOLN COMPARISON:  None. FINDINGS: Cardiovascular: Large filling defect within the proximal LEFT lower lobe pulmonary artery. Filling defect spans the bifurcation of the LEFT lower lobe and lingular branch pulmonary branches. This this filling defect/thrombus is occlusive to the LEFT lower lobe. No LEFT upper lobe filling defects identified. No filling defect identified within the RIGHT lung pulmonary arteries. No evidence of RIGHT ventricular strain with the RV to LV ratio less than 1. Coronary artery calcification and aortic atherosclerotic calcification. Mediastinum/Nodes: No axillary or supraclavicular adenopathy. No mediastinal or hilar adenopathy. No pericardial fluid. Esophagus normal. Lungs/Pleura: There is diffuse very fine airspace disease within the LEFT lower lobe involving the majority of the LEFT lower lobe. Similar milder findings in the posterior RIGHT lower lobe and lingula. Upper Abdomen: Limited view of the liver, kidneys, pancreas are unremarkable. Normal adrenal glands. Musculoskeletal: No aggressive osseous lesion. Review of the MIP images confirms the above findings. IMPRESSION: 1. Large occlusive pulmonary embolism within  the proximal LEFT lower lobe pulmonary artery and lingular pulmonary artery. 2. Diffuse fine airspace disease within the LEFT lower lobe with differential including pulmonary infarction, pulmonary hemorrhage, or pneumonia (including COVID pneumonia). 3. No evidence of RIGHT ventricular strain. 4. Coronary artery calcification and aortic atherosclerotic calcification. Critical Value/emergent results were called by telephone at the time of interpretation on 05/21/2020 at 6:55 pm to provider [facility] , who verbally acknowledged these results. Electronically Signed: By: Genevive Bi M.D. On: 05/21/2020 18:59   IR IVC FILTER PLMT / S&I Lenise Arena GUID/MOD SED  Result Date: 05/24/2020 INDICATION: 69 year old with recent cardiac arrest likely secondary to pulmonary embolism and COVID-19. Subsequent right ventricular failure. Patient has clot in the left pulmonary arteries and clot in left femoral vein. There is concern that the patient would not tolerate additional clot burden in the pulmonary arteries and request for IVC filter placement. EXAM: IVC FILTER PLACEMENT; IVC VENOGRAM; ULTRASOUND FOR VASCULAR ACCESS Physician: Rachelle Hora. Lowella Dandy, MD MEDICATIONS: None. ANESTHESIA/SEDATION: Versed 1 mg The patient was continuously monitored during the procedure by the interventional radiology nurse under my direct supervision. CONTRAST:  Carbon dioxide FLUOROSCOPY TIME:  Fluoroscopy Time: 3 minutes, 18 seconds, 18 mGy COMPLICATIONS: None immediate. PROCEDURE: Informed consent was obtained for an IVC filter placement from the patient's daughter. Ultrasound demonstrated a patent right internal jugular vein. Ultrasound images were obtained for documentation. The right neck was prepped and draped in a sterile fashion. Maximal barrier sterile technique was utilized including caps, mask, sterile gowns, sterile gloves, sterile drape, hand hygiene and skin antiseptic. The skin was anesthetized with 1% lidocaine. A 21 gauge needle was  directed into the vein with ultrasound guidance and a micropuncture dilator set was placed. A wire was advanced into the IVC. The filter sheath was advanced over the wire into the IVC. An IVC venogram was performed with carbon dioxide. Bilateral renal veins were cannulated using a 5 French catheter and Bentson wire. Bilateral renal veins are identified. Fluoroscopic images were obtained for documentation. A Bard Denali filter was deployed below the lowest renal vein. Post placement images of the filter were obtained. The vascular sheath was removed with manual compression. Bandage placed over the puncture site. FINDINGS: IVC was patent. Bilateral renal veins were identified. The filter was deployed below the lowest renal vein. IMPRESSION: Successful placement of a retrievable IVC filter. PLAN: This IVC filter is  potentially retrievable. The patient will be assessed for filter retrieval by Interventional Radiology in approximately 8-12 weeks. Further recommendations regarding filter retrieval, continued surveillance or declaration of device permanence, will be made at that time. Electronically Signed   By: Richarda Overlie M.D.   On: 05/24/2020 17:57   DG CHEST PORT 1 VIEW  Result Date: 05/26/2020 CLINICAL DATA:  Acute respiratory failure.  Hypoxia. EXAM: PORTABLE CHEST 1 VIEW COMPARISON:  05/22/2020. FINDINGS: Interim extubation and removal of NG tube. Left IJ line in stable position. Heart size normal. Diffuse left lung infiltrate. Right base infiltrate. No prominent pleural effusion. No pneumothorax. IMPRESSION: 1. Interim extubation and removal of NG tube. Left IJ line stable position. 2. Diffuse left lung infiltrate and right base infiltrate. Electronically Signed   By: Maisie Fus  Register   On: 05/26/2020 05:50   DG CHEST PORT 1 VIEW  Result Date: 05/22/2020 CLINICAL DATA:  Central line placement, intubation EXAM: PORTABLE CHEST 1 VIEW COMPARISON:  05/21/2020 FINDINGS: Left central line has been placed with  the tip in the SVC. No pneumothorax. Endotracheal tube is 5 cm above the carina. Patchy airspace disease throughout the left lung. No focal opacity on the right. Heart is normal size. IMPRESSION: Endotracheal tube 5 cm above the carina. Left central line tip in the SVC. No pneumothorax. Stable patchy left lung airspace disease. Electronically Signed   By: Charlett Nose M.D.   On: 05/22/2020 21:06   DG Chest Port 1 View  Result Date: 05/21/2020 CLINICAL DATA:  Cough, lower back pain, has not eaten 5 days, emesis EXAM: PORTABLE CHEST 1 VIEW COMPARISON:  Radiograph 04/19/2010, CT 04/18/2010 FINDINGS: Heterogeneous opacities present predominantly along the periphery of the left lung and minimally in the right lung base as well. No pneumothorax or effusion. The aorta is calcified. The remaining cardiomediastinal contours are unremarkable. No acute osseous or soft tissue abnormality. Degenerative changes are present in the imaged spine and shoulders. Telemetry leads overlie the chest. IMPRESSION: Heterogeneous opacities throughout the lungs, greatest in the left lung periphery concerning for pneumonia including potential viral etiology. Aortic Atherosclerosis (ICD10-I70.0). Electronically Signed   By: Kreg Shropshire M.D.   On: 05/21/2020 03:50   DG Abd Portable 1V  Result Date: 05/23/2020 CLINICAL DATA:  Encounter for orogastric tube placement. EXAM: PORTABLE ABDOMEN - 1 VIEW COMPARISON:  06/09/2009 and chest radiograph 05/22/2020 FINDINGS: Gastric tube extends into the abdomen and the tip is in the midline of the lower abdomen, likely in the distal stomach body region. Again noted are patchy densities at the left lung base. Few gas-filled loops of bowel in the abdomen. IMPRESSION: Orogastric tube in the lower abdominal midline and likely in the distal stomach body region. Electronically Signed   By: Richarda Overlie M.D.   On: 05/23/2020 12:17   ECHOCARDIOGRAM COMPLETE  Result Date: 05/23/2020    ECHOCARDIOGRAM  REPORT   Patient Name:   JESUS NEVILLS Date of Exam: 05/23/2020 Medical Rec #:  [id number]      Height:       72.0 in Accession #:    [id number]     Weight:       142.4 lb Date of Birth:  29-Feb-1951      BSA:          1.844 m Patient Age:    69 years       BP:           112/85 mmHg Patient Gender: M  HR:           96 bpm. Exam Location:  Inpatient Procedure: 2D Echo, Cardiac Doppler and Color Doppler STAT ECHO Indications:    I26.02 Pulmonary embolus  History:        Patient has no prior history of Echocardiogram examinations.                 Arrythmias:Cardiac Arrest; Signs/Symptoms:Dyspnea, Hypotension                 and Altered Mental Status. Covid 19 positive.  Sonographer:    Sheralyn Boatman RDCS Referring Phys: [id number] Martina Sinner  Sonographer Comments: Technically difficult study due to poor echo windows and echo performed with patient supine and on artificial respirator. IMPRESSIONS  1. Severe RV failure and cor pulmonale.  2. Abnormal septal motion and septal flattening consistent with RV pressure overload. Left ventricular ejection fraction, by estimation, is 50 to 55%. The left ventricle has low normal function. The left ventricle has no regional wall motion abnormalities. Left ventricular diastolic parameters were normal.  3. Right ventricular systolic function is severely reduced. The right ventricular size is severely enlarged. There is mildly elevated pulmonary artery systolic pressure.  4. The mitral valve is normal in structure. No evidence of mitral valve regurgitation. No evidence of mitral stenosis.  5. The aortic valve is normal in structure. Aortic valve regurgitation is not visualized. No aortic stenosis is present.  6. The inferior vena cava is dilated in size with <50% respiratory variability, suggesting right atrial pressure of 15 mmHg. FINDINGS  Left Ventricle: Abnormal septal motion and septal flattening consistent with RV pressure overload. Left ventricular ejection  fraction, by estimation, is 50 to 55%. The left ventricle has low normal function. The left ventricle has no regional wall motion abnormalities. The left ventricular internal cavity size was normal in size. There is no left ventricular hypertrophy. Left ventricular diastolic parameters were normal. Right Ventricle: The right ventricular size is severely enlarged. Right vetricular wall thickness was not assessed. Right ventricular systolic function is severely reduced. There is mildly elevated pulmonary artery systolic pressure. The tricuspid regurgitant velocity is 2.67 m/s, and with an assumed right atrial pressure of 8 mmHg, the estimated right ventricular systolic pressure is 36.5 mmHg. Left Atrium: Left atrial size was normal in size. Right Atrium: Right atrial size was normal in size. Pericardium: There is no evidence of pericardial effusion. Mitral Valve: The mitral valve is normal in structure. No evidence of mitral valve regurgitation. No evidence of mitral valve stenosis. Tricuspid Valve: The tricuspid valve is normal in structure. Tricuspid valve regurgitation is mild . No evidence of tricuspid stenosis. Aortic Valve: The aortic valve is normal in structure. Aortic valve regurgitation is not visualized. No aortic stenosis is present. Pulmonic Valve: The pulmonic valve was normal in structure. Pulmonic valve regurgitation is not visualized. No evidence of pulmonic stenosis. Aorta: The aortic root is normal in size and structure. Venous: The inferior vena cava is dilated in size with less than 50% respiratory variability, suggesting right atrial pressure of 15 mmHg. IAS/Shunts: No atrial level shunt detected by color flow Doppler. Additional Comments: Severe RV failure and cor pulmonale.  LEFT VENTRICLE PLAX 2D LVIDd:         3.70 cm     Diastology LVIDs:         2.80 cm     LV e' medial:    4.46 cm/s LV PW:  1.10 cm     LV E/e' medial:  4.9 LV IVS:        1.10 cm     LV e' lateral:   6.97 cm/s LVOT  diam:     2.10 cm     LV E/e' lateral: 3.1 LV SV:         28 LV SV Index:   15 LVOT Area:     3.46 cm  LV Volumes (MOD) LV vol d, MOD A2C: 22.6 ml LV vol d, MOD A4C: 25.5 ml LV vol s, MOD A2C: 11.1 ml LV vol s, MOD A4C: 11.7 ml LV SV MOD A2C:     11.5 ml LV SV MOD A4C:     25.5 ml LV SV MOD BP:      13.7 ml RIGHT VENTRICLE            IVC RV S prime:     8.84 cm/s  IVC diam: 2.50 cm TAPSE (M-mode): 1.3 cm LEFT ATRIUM           Index      RIGHT ATRIUM           Index LA diam:      2.80 cm 1.52 cm/m RA Area:     14.30 cm LA Vol (A4C): 10.5 ml 5.69 ml/m RA Volume:   42.70 ml  23.15 ml/m  AORTIC VALVE LVOT Vmax:   53.80 cm/s LVOT Vmean:  41.900 cm/s LVOT VTI:    0.080 m  AORTA Ao Root diam: 3.60 cm Ao Asc diam:  3.50 cm MITRAL VALVE               TRICUSPID VALVE MV Area (PHT): 3.27 cm    TR Peak grad:   28.5 mmHg MV Decel Time: 232 msec    TR Vmax:        267.00 cm/s MV E velocity: 21.94 cm/s MV A velocity: 41.30 cm/s  SHUNTS MV E/A ratio:  0.53        Systemic VTI:  0.08 m                            Systemic Diam: 2.10 cm Charlton Haws MD Electronically signed by Charlton Haws MD Signature Date/Time: 05/23/2020/10:46:38 AM    Final    CT RENAL STONE STUDY  Result Date: 05/21/2020 CLINICAL DATA:  69 year old male with low back pain, flank pain, decreased p.o. EXAM: CT ABDOMEN AND PELVIS WITHOUT CONTRAST TECHNIQUE: Multidetector CT imaging of the abdomen and pelvis was performed following the standard protocol without IV contrast. COMPARISON:  Noncontrast CT Abdomen and Pelvis 10/28/2008. Portable chest radiograph 04/19/2010. FINDINGS: Lower chest: Patchy, irregular peribronchial and peripheral scattered pulmonary opacity at both lung bases. This seems to be new from last month. Underlying probable pulmonary hyperinflation. Some associated lung base bronchiectasis. No cavitating areas identified. No cardiomegaly, pericardial effusion or pleural effusion. Hepatobiliary: Negative noncontrast liver and gallbladder.  Pancreas: Negative. Spleen: Negative. Adrenals/Urinary Tract: Bulky left nephrolithiasis measuring 13 mm at the renal pelvis. Additional left lower pole stone. No hydronephrosis. Negative noncontrast right kidney. No hydroureter. Unremarkable urinary bladder. Incidental pelvic phleboliths. Stomach/Bowel: Decompressed large bowel. There is long segment circumferential wall thickening in the right colon, up to the 12 mm series 4, image 49) with some areas of adjacent mesenteric stranding (coronal image 43). The wall thickening gradually decreases through the transverse colon. No free air. No free fluid. The terminal ileum appear spared. Appendix  seems to remain normal on coronal image 32. No dilated small bowel.  Decompressed stomach and duodenum. Vascular/Lymphatic: Normal caliber abdominal aorta. Mild calcified atherosclerosis. Vascular patency is not evaluated in the absence of IV contrast. No lymphadenopathy identified in the absence of contrast. Reproductive: Negative. Other: Previous lower abdominal ventral hernia repair with mesh. No pelvic free fluid. Musculoskeletal: Degenerative changes in the spine. No acute osseous abnormality identified. IMPRESSION: 1. In the abdomen there is circumferential bowel wall thickening and mesenteric stranding involving the right colon. Wall thickening gradually abates through the transverse colon. This is compatible with Acute Nonspecific Colitis. 2. But the lung bases are also abnormal with patchy and irregular peribronchial and peripheral pulmonary opacity suspicious for acute viral/atypical respiratory infection. No areas of cavitation to suggest septic emboli. No pleural effusion. 3. Bulky left nephrolithiasis, but no obstructive uropathy. 4. Previous lower abdominal hernia repair with mesh. Electronically Signed   By: Odessa Fleming M.D.   On: 05/21/2020 02:46   VAS [place] LOWER EXTREMITY VENOUS (DVT)  Result Date: 05/24/2020  Lower Venous DVT Study Indications: Covid-19,  pulmonary embolism.  Risk Factors: Confirmed PE. Anticoagulation: Heparin. Limitations: Body habitus, ventilation, did not perform compressions in thigh and popliteal fossa of the left lower extremity secondary to mobile thrombus, bandages and line. Comparison Study: No prior study Performing Technologist: Sherren Kerns RVS  Examination Guidelines: A complete evaluation includes B-mode imaging, spectral Doppler, color Doppler, and power Doppler as needed of all accessible portions of each vessel. Bilateral testing is considered an integral part of a complete examination. Limited examinations for reoccurring indications may be performed as noted. The reflux portion of the exam is performed with the patient in reverse Trendelenburg.  +---------+---------------+---------+-----------+----------+--------------+ RIGHT    CompressibilityPhasicitySpontaneityPropertiesThrombus Aging +---------+---------------+---------+-----------+----------+--------------+ CFV      Full           Yes      No                                  +---------+---------------+---------+-----------+----------+--------------+ SFJ      Full                                                        +---------+---------------+---------+-----------+----------+--------------+ FV Prox  Full                                                        +---------+---------------+---------+-----------+----------+--------------+ FV Mid   Full                                                        +---------+---------------+---------+-----------+----------+--------------+ FV DistalFull                                                        +---------+---------------+---------+-----------+----------+--------------+  PFV      Full                                                        +---------+---------------+---------+-----------+----------+--------------+ POP      Full           Yes      No                                   +---------+---------------+---------+-----------+----------+--------------+ PTV      Full                                                        +---------+---------------+---------+-----------+----------+--------------+ PERO     Full                                                        +---------+---------------+---------+-----------+----------+--------------+   +---------+---------------+---------+-----------+----------+-------------------+ LEFT     CompressibilityPhasicitySpontaneityPropertiesThrombus Aging      +---------+---------------+---------+-----------+----------+-------------------+ CFV                                                   not visualized                                                            secondary to                                                              bandage/line        +---------+---------------+---------+-----------+----------+-------------------+ SFJ                                                   not visualized                                                            secondary to  bandage/line        +---------+---------------+---------+-----------+----------+-------------------+ FV Prox  Full                                         patent by color     +---------+---------------+---------+-----------+----------+-------------------+ FV Mid                                                patent by color     +---------+---------------+---------+-----------+----------+-------------------+ FV Distal                                             patent by color     +---------+---------------+---------+-----------+----------+-------------------+ PFV                                                   patent by color     +---------+---------------+---------+-----------+----------+-------------------+ POP                                                    patent by color     +---------+---------------+---------+-----------+----------+-------------------+ PTV      Full                                                             +---------+---------------+---------+-----------+----------+-------------------+ PERO     Full                                                             +---------+---------------+---------+-----------+----------+-------------------+     Summary: RIGHT: - There is no evidence of deep vein thrombosis in the lower extremity.  vessels are dilated with significantly sluggish flow, throughout  LEFT: - Findings consistent with acute deep vein thrombosis involving the left femoral vein. - Acute, mobile thrombus noted in the proximal and mid femoral vein. Vessels are dilated with significantly sluggish flow, throughout.  *See table(s) above for measurements and observations. Electronically signed by Waverly Ferrarihristopher Dickson MD on 05/24/2020 at 6:21:22 PM.    Final

## 2020-06-01 NOTE — Progress Notes (Signed)
Physical Therapy Treatment Patient Details Name: Dwayne Barnes MRN: [id number] DOB: [birth year] Today's Date: 06/01/2020    History of Present Illness 69 yo admitted 11/18 with back pain, weakness and colitis found to be Covid (+) with LLL PE. 11/19 pt with cardiac arrest intubated, left femoral DVT with IVC filter placed 11/22. 11/23 pt self-extubated. 11/24 fem line removed. No significant PMHx    PT Comments    Pt with slow progress. Unable to attempt amb since every time pt moves he has liquid stood. Continue to recommend SNF.    Follow Up Recommendations  SNF     Equipment Recommendations  3in1 (PT);Rolling walker with 5" wheels    Recommendations for Other Services       Precautions / Restrictions Precautions Precautions: Fall Precaution Comments: pt is extremely HOH Restrictions Weight Bearing Restrictions: No    Mobility  Bed Mobility               General bed mobility comments: Pt up in chair  Transfers Overall transfer level: Needs assistance Equipment used: Rolling walker (2 wheeled) Transfers: Sit to/from [employer] Sit to Stand: Mod assist Stand pivot transfers: Mod assist       General transfer comment: Assist to bring hips up and for balance. Pivotal steps bed to bsc to bed with flexed trunk   Ambulation/Gait             General Gait Details: Unable to attempt due to liquid stool anytime pt moves   Stairs             Wheelchair Mobility    Modified Rankin (Stroke Patients Only)       Balance Overall balance assessment: Needs assistance Sitting-balance support: Feet supported Sitting balance-Leahy Scale: Fair     Standing balance support: Bilateral upper extremity supported Standing balance-Leahy Scale: Poor Standing balance comment: walker and min assist for static standing                            Cognition Arousal/Alertness: Awake/alert Behavior During Therapy: WFL for tasks  assessed/performed Overall Cognitive Status: No family/caregiver present to determine baseline cognitive functioning                                 General Comments: Followed 1 step commands with slow, loud speech. Difficulty hearing while moving      Exercises      General Comments        Pertinent Vitals/Pain Pain Assessment: No/denies pain Faces Pain Scale: No hurt    Home Living                      Prior Function            PT Goals (current goals can now be found in the care plan section) Acute Rehab PT Goals Patient Stated Goal: not stated Progress towards PT goals: Progressing toward goals    Frequency    Min 2X/week      PT Plan Current plan remains appropriate    Co-evaluation              AM-PAC PT "6 Clicks" Mobility   Outcome Measure  Help needed turning from your back to your side while in a flat bed without using bedrails?: A Little Help needed moving from lying on your back to sitting on the  side of a flat bed without using bedrails?: A Little Help needed moving to and from a bed to a chair (including a wheelchair)?: A Lot Help needed standing up from a chair using your arms (e.g., wheelchair or bedside chair)?: A Lot Help needed to walk in hospital room?: A Lot Help needed climbing 3-5 steps with a railing? : A Lot 6 Click Score: 14    End of Session   Activity Tolerance: Other (comment) (Limited by frequent liquid stool) Patient left: with call bell/phone within reach;in chair Nurse Communication: Mobility status;Other (comment) (condom cath fell off) PT Visit Diagnosis: Muscle weakness (generalized) (M62.81);Difficulty in walking, not elsewhere classified (R26.2)     Time: 4270-6237 PT Time Calculation (min) (ACUTE ONLY): 24 min  Charges:  $Therapeutic Activity: 23-37 mins                     [facility] PT Acute Rehabilitation Services Pager [phone] Office [phone]    Angelina Ok  [facility] 06/01/2020, 10:57 AM

## 2020-06-02 LAB — CBC
HCT: 27.1 % — ABNORMAL LOW (ref 39.0–52.0)
Hemoglobin: 9.2 g/dL — ABNORMAL LOW (ref 13.0–17.0)
MCH: 28.8 pg (ref 26.0–34.0)
MCHC: 33.9 g/dL (ref 30.0–36.0)
MCV: 85 fL (ref 80.0–100.0)
Platelets: 213 10*3/uL (ref 150–400)
RBC: 3.19 MIL/uL — ABNORMAL LOW (ref 4.22–5.81)
RDW: 14.1 % (ref 11.5–15.5)
WBC: 19.5 10*3/uL — ABNORMAL HIGH (ref 4.0–10.5)
nRBC: 0 % (ref 0.0–0.2)

## 2020-06-02 LAB — BASIC METABOLIC PANEL
Anion gap: 8 (ref 5–15)
Anion gap: 8 (ref 5–15)
BUN: 15 mg/dL (ref 8–23)
BUN: 13 mg/dL (ref 8–23)
CO2: 22 mmol/L (ref 22–32)
CO2: 23 mmol/L (ref 22–32)
Calcium: 7.4 mg/dL — ABNORMAL LOW (ref 8.9–10.3)
Calcium: 7.6 mg/dL — ABNORMAL LOW (ref 8.9–10.3)
Chloride: 94 mmol/L — ABNORMAL LOW (ref 98–111)
Chloride: 95 mmol/L — ABNORMAL LOW (ref 98–111)
Creatinine, Ser: 1.11 mg/dL (ref 0.61–1.24)
Creatinine, Ser: 1.02 mg/dL (ref 0.61–1.24)
GFR, Estimated: >60 mL/min (ref >60)
GFR, Estimated: >60 mL/min (ref >60)
Glucose, Bld: 116 mg/dL — ABNORMAL HIGH (ref 70–99)
Glucose, Bld: 119 mg/dL — ABNORMAL HIGH (ref 70–99)
Potassium: 3.5 mmol/L (ref 3.5–5.1)
Potassium: 3.2 mmol/L — ABNORMAL LOW (ref 3.5–5.1)
Sodium: 124 mmol/L — ABNORMAL LOW (ref 135–145)
Sodium: 126 mmol/L — ABNORMAL LOW (ref 135–145)

## 2020-06-02 LAB — COMPREHENSIVE METABOLIC PANEL
ALT: 53 U/L — ABNORMAL HIGH (ref 0–44)
AST: 43 U/L — ABNORMAL HIGH (ref 15–41)
Albumin: 1.4 g/dL — ABNORMAL LOW (ref 3.5–5.0)
Alkaline Phosphatase: 131 U/L — ABNORMAL HIGH (ref 38–126)
Anion gap: 10 (ref 5–15)
BUN: 21 mg/dL (ref 8–23)
CO2: 21 mmol/L — ABNORMAL LOW (ref 22–32)
Calcium: 7.3 mg/dL — ABNORMAL LOW (ref 8.9–10.3)
Chloride: 92 mmol/L — ABNORMAL LOW (ref 98–111)
Creatinine, Ser: 1.41 mg/dL — ABNORMAL HIGH (ref 0.61–1.24)
GFR, Estimated: 54 mL/min — ABNORMAL LOW (ref >60)
Glucose, Bld: 152 mg/dL — ABNORMAL HIGH (ref 70–99)
Potassium: 2.9 mmol/L — ABNORMAL LOW (ref 3.5–5.1)
Sodium: 123 mmol/L — ABNORMAL LOW (ref 135–145)
Total Bilirubin: 1.3 mg/dL — ABNORMAL HIGH (ref 0.3–1.2)
Total Protein: 4.1 g/dL — ABNORMAL LOW (ref 6.5–8.1)

## 2020-06-02 LAB — GLUCOSE, CAPILLARY
Glucose-Capillary: 103 mg/dL — ABNORMAL HIGH (ref 70–99)
Glucose-Capillary: 113 mg/dL — ABNORMAL HIGH (ref 70–99)
Glucose-Capillary: 124 mg/dL — ABNORMAL HIGH (ref 70–99)
Glucose-Capillary: 130 mg/dL — ABNORMAL HIGH (ref 70–99)
Glucose-Capillary: 87 mg/dL (ref 70–99)
Glucose-Capillary: 87 mg/dL (ref 70–99)
Glucose-Capillary: 94 mg/dL (ref 70–99)
Glucose-Capillary: 96 mg/dL (ref 70–99)

## 2020-06-02 LAB — C DIFFICILE (CDIFF) QUICK SCRN (NO PCR REFLEX)
C Diff antigen: POSITIVE — AB
C Diff interpretation: DETECTED
C Diff toxin: POSITIVE — AB

## 2020-06-02 MED ORDER — LACTATED RINGERS IV BOLUS
500.0000 mL | Freq: Once | INTRAVENOUS | Status: AC
  Administered 2020-06-02: 500 mL via INTRAVENOUS

## 2020-06-02 MED ORDER — POTASSIUM CHLORIDE 10 MEQ/100ML IV SOLN
10.0000 meq | INTRAVENOUS | Status: AC
  Administered 2020-06-02 (×3): 10 meq via INTRAVENOUS
  Filled 2020-06-02 (×3): qty 100

## 2020-06-02 MED ORDER — POTASSIUM CHLORIDE CRYS ER 20 MEQ PO TBCR
40.0000 meq | EXTENDED_RELEASE_TABLET | Freq: Once | ORAL | Status: AC
  Administered 2020-06-02: 40 meq via ORAL
  Filled 2020-06-02: qty 2

## 2020-06-02 MED ORDER — PANTOPRAZOLE SODIUM 40 MG PO TBEC
40.0000 mg | DELAYED_RELEASE_TABLET | Freq: Every day | ORAL | Status: DC
  Administered 2020-06-02 – 2020-06-04 (×3): 40 mg via ORAL
  Filled 2020-06-02 (×3): qty 1

## 2020-06-02 MED ORDER — VANCOMYCIN 50 MG/ML ORAL SOLUTION
125.0000 mg | Freq: Four times a day (QID) | ORAL | Status: AC
  Administered 2020-06-02 – 2020-06-11 (×40): 125 mg via ORAL
  Filled 2020-06-02 (×42): qty 2.5

## 2020-06-02 MED ORDER — POTASSIUM CHLORIDE 2 MEQ/ML IV SOLN
INTRAVENOUS | Status: DC
  Filled 2020-06-02 (×10): qty 1000

## 2020-06-02 NOTE — Progress Notes (Signed)
CRITICAL VALUE ALERT  Critical Value:  c-diff +  Date & Time Notied:  06/02/20 1200  Provider Notified: Dr. Jerral Ralph  Orders Received/Actions taken: Oral vancomycin

## 2020-06-02 NOTE — NC FL2 (Signed)
MEDICAID FL2 LEVEL OF CARE SCREENING TOOL     IDENTIFICATION  Patient Name: Dwayne Barnes Birthdate: 1951-03-22 Sex: male Admission Date (Current Location): 05/20/2020  [facility] and [place] Number:  [employer] and Address:  [facility], [address] [place], [place] [address]      Provider Number: [id number]  Attending Physician Name and Address:  Maretta Bees, MD  Relative Name and Phone Number:  Goar, daughter, [phone]    Current Level of Care: Hospital Recommended Level of Care: Skilled Nursing Facility Prior Approval Number:    Date Approved/Denied:   PASRR Number: [id number]  Discharge Plan: SNF    Current Diagnoses: Patient Active Problem List   Diagnosis Date Noted  . Generalized weakness 05/21/2020  . COVID-19 virus infection 05/21/2020  . Acute pulmonary embolism (HCC) 05/21/2020    Orientation RESPIRATION BLADDER Height & Weight     Self, Time, Place  Normal Incontinent, External catheter Weight: 136 lb 11 oz (62 kg) Height:  6' (182.9 cm)  BEHAVIORAL SYMPTOMS/MOOD NEUROLOGICAL BOWEL NUTRITION STATUS      Incontinent Diet (Please see DC Summary)  AMBULATORY STATUS COMMUNICATION OF NEEDS Skin   Extensive Assist Verbally Other (Comment) (Puncture on throat)                       Personal Care Assistance Level of Assistance  Bathing, Feeding, Dressing Bathing Assistance: Maximum assistance Feeding assistance: Limited assistance Dressing Assistance: Limited assistance     Functional Limitations Info  Hearing   Hearing Info: Impaired      SPECIAL CARE FACTORS FREQUENCY  PT (By licensed PT), OT (By licensed OT)     PT Frequency: 5x/week OT Frequency: 5x/week            Contractures Contractures Info: Not present    Additional Factors Info  Code Status, Allergies, Isolation Precautions, Insulin Sliding Scale Code Status Info: Full Allergies Info: NKA   Insulin  Sliding Scale Info: See DC Summary Isolation Precautions Info: COVID + 05/21/20; Enteric precautions     Current Medications (06/02/2020):  This is the current hospital active medication list Current Facility-Administered Medications  Medication Dose Route Frequency Provider Last Rate Last Admin  . acetaminophen (TYLENOL) 160 MG/5ML solution 650 mg  650 mg Oral Q6H PRN Maretta Bees, MD   650 mg at 05/30/20 1720  . apixaban (ELIQUIS) tablet 10 mg  10 mg Oral BID Calton Dach I, RPH   10 mg at 06/02/20 0175   Followed by  . [START ON 06/07/2020] apixaban (ELIQUIS) tablet 5 mg  5 mg Oral BID Calton Dach I, [facility]      . Chlorhexidine Gluconate Cloth 2 % PADS 6 each  6 each Topical Daily Martina Sinner, MD   6 each at 06/02/20 1055  . feeding supplement (ENSURE ENLIVE / ENSURE PLUS) liquid 237 mL  237 mL Oral TID BM Maretta Bees, MD   237 mL at 06/02/20 0934  . insulin aspart (novoLOG) injection 0-9 Units  0-9 Units Subcutaneous Q4H Migdalia Dk, MD   1 Units at 06/02/20 0843  . lactated ringers 1,000 mL with potassium chloride 40 mEq infusion   Intravenous Continuous Maretta Bees, MD 100 mL/hr at 06/02/20 0915 New Bag at 06/02/20 0915  . ondansetron (ZOFRAN) injection 4 mg  4 mg Intravenous Q6H PRN Jonah Blue, MD      . pantoprazole (PROTONIX) EC tablet 40 mg  40 mg Oral Daily Maretta Bees, MD   40 mg at 06/02/20 9528  . sodium chloride flush (NS) 0.9 % injection 10-40 mL  10-40 mL Intracatheter Q12H Icard, Bradley L, DO   10 mL at 06/02/20 0849  . sodium chloride flush (NS) 0.9 % injection 10-40 mL  10-40 mL Intracatheter PRN Icard, Bradley L, DO   10 mL at 05/26/20 0952  . vancomycin (VANCOCIN) 50 mg/mL oral solution 125 mg  125 mg Oral QID Maretta Bees, MD   125 mg at 06/02/20 4132     Discharge Medications: Please see discharge summary for a list of discharge medications.  Relevant Imaging Results:  Relevant Lab  Results:   Additional Information SSN: [id number] [place], [place]

## 2020-06-02 NOTE — Progress Notes (Signed)
Occupational Therapy Treatment Patient Details Name: Dwayne Barnes MRN: [id number] DOB: 1950/08/26 Today's Date: 06/02/2020    History of present illness 69 yo admitted 11/18 with back pain, weakness and colitis found to be Covid (+) with LLL PE. 11/19 pt with cardiac arrest intubated, left femoral DVT with IVC filter placed 11/22. 11/23 pt self-extubated. 11/24 fem line removed. No significant PMHx, as of 11/30 positive for CDiff   OT comments  Pt session limited by frequent liquid bowel incontinence. Pt cleaned up at bed level x2 with Pt performing rolling at supervision level and hip raises as bed level - mod A for moving up in the bed. OT will continue to follow acutely SNF remains appropriate.    Follow Up Recommendations  SNF    Equipment Recommendations  3 in 1 bedside commode    Recommendations for Other Services      Precautions / Restrictions Precautions Precautions: Fall Precaution Comments: pt is extremely HOH       Mobility Bed Mobility Overal bed mobility: Needs Assistance Bed Mobility: Rolling Rolling: Supervision         General bed mobility comments: Rolling for peri care  Transfers                 General transfer comment: deferred due to incontinence of bowel    Balance                                           ADL either performed or assessed with clinical judgement   ADL Overall ADL's : Needs assistance/impaired     Grooming: Set up;Bed level       Lower Body Bathing: Maximal assistance;Bed level   Upper Body Dressing : Minimal assistance;Bed level                     General ADL Comments: Pt limited by loose watery stool - constant     Vision       Perception     Praxis      Cognition Arousal/Alertness: Awake/alert Behavior During Therapy: WFL for tasks assessed/performed Overall Cognitive Status: No family/caregiver present to determine baseline cognitive functioning                                           Exercises     Shoulder Instructions       General Comments      Pertinent Vitals/ Pain       Pain Assessment: Faces Faces Pain Scale: Hurts a little bit Pain Location: peri area rash Pain Descriptors / Indicators: Discomfort Pain Intervention(s): Monitored during session;Repositioned;Other (comment) (barrier cream applied)  Home Living                                          Prior Functioning/Environment              Frequency  Min 2X/week        Progress Toward Goals  OT Goals(current goals can now be found in the care plan section)  Progress towards OT goals: Not progressing toward goals - comment (limited by bowel incontinence)  Acute Rehab OT Goals Patient Stated  Goal: not stated OT Goal Formulation: With patient Time For Goal Achievement: 06/10/20 Potential to Achieve Goals: Good  Plan Discharge plan remains appropriate    Co-evaluation                 AM-PAC OT "6 Clicks" Daily Activity     Outcome Measure   Help from another person eating meals?: A Little Help from another person taking care of personal grooming?: A Little Help from another person toileting, which includes using toliet, bedpan, or urinal?: A Lot Help from another person bathing (including washing, rinsing, drying)?: A Lot Help from another person to put on and taking off regular upper body clothing?: A Little Help from another person to put on and taking off regular lower body clothing?: A Lot 6 Click Score: 15    End of Session    OT Visit Diagnosis: Unsteadiness on feet (R26.81);Other abnormalities of gait and mobility (R26.89);Muscle weakness (generalized) (M62.81)   Activity Tolerance Other (comment) (limited by bowel incontinence)   Patient Left in bed;with call bell/phone within reach;with bed alarm set;with nursing/sitter in room   Nurse Communication Mobility status        Time: 1030-1057 OT Time  Calculation (min): 27 min  Charges: OT General Charges $OT Visit: 1 Visit OT Treatments $Self Care/Home Management : 23-37 mins  Nyoka Cowden OTR/L Acute Rehabilitation Services Pager: [phone] Office: [phone]   Evern Bio Naveya Ellerman 06/02/2020, 3:03 PM

## 2020-06-02 NOTE — Progress Notes (Signed)
PROGRESS NOTE                                                                                                                                                                                                             Patient Demographics:    Dwayne Barnes, is a 69 y.o. male, DOB - 10-22-1950, UJW:[id number]  Outpatient Primary MD for the patient is [name], PA-C   Admit date - 05/20/2020   LOS - 12  Chief Complaint  Patient presents with  . Fatigue       Brief Narrative: Patient is a 69 y.o. male with no significant past medical history-presented to the ED on 11/17 with nausea/vomiting, weakness and back pain-further evaluation revealed COVID-19 infection and a large PE.  Patient was started on IV heparin infusion along with steroid/Remdesivir-on 11/19-patient had a cardiac arrest-was intubated during the code-total downtime of around 6 minutes before ROSC.  Patient was subsequently transferred to the ICU-unfortunately soon after arrival in the ICU-he had another cardiac arrest-and 4 rounds of CPR were provided with ROSC.  See below for further details.   COVID-19 vaccinated status: Vaccinated  Significant Events: 11/17>> Admit to [facility] for PE/COVID-19 infection 11/19>> PEA cardiac arrest x 2-given TPA-intubated-transfer to ICU 11/23>> Self extubated 11/25>> transfer to [facility] 11/29>> worsening diarrhea-CT abdomen with progressive colitis  Significant studies: 11/18>>CT renal stone: non specific colitis, GGO's, left nephrolithiasis without obstructive uropathy. 11/18>>CTA chest: large PC in LLL, GGO's LLL. 11/20 echo>> EF 50-55%, RV systolic function severely reduced. 11/21>> B/L extremity Doppler: acute DVTin left femoral vein, acute mobile thrombus in the proximal femoral vein. 11/23>>CT Head: unremarkable   11/29>> CT abdomen/pelvis>> progressive colitis  COVID-19 medications: Steroids: 11/17>>11/26 Remdesivir:  11/17>> 11/22  Antibiotics: None  Microbiology data: 11/18>> urine culture: Insignificant growth 11/30>> stool C. difficile: Pending 11/30>> GI pathogen panel: Pending  Procedures: ETT>>11/19 - 11/23 IVC filter placement by IR>>11/21.  Consults: PCCM, IR  DVT prophylaxis: Therapeutic Lovenox>> Eliquis    Subjective:   Continues to have diarrhea-mild left lower quadrant abdominal pain.   Assessment  & Plan :   Cardiac arrest due to large PE: S/p TPA-was on therapeutic Lovenox-we will transition to Eliquis today. PE likely provoked by COVID-19 infection.  PCCM MD discussed with Dr. Illa Level indication for Impella support.  Continue telemetry monitoring.  Echo as above.  Large PE with DVT: Continue therapeutic anticoagulation-he is s/p TPA on 11/19 when he had cardiac arrest.  Patient is s/p IVC filter given mobile thrombus seen on lower extremity Doppler.  Once oral intake was stabilized-he was transitioned to Eliquis.  Acute hypoxic respiratory failure: Due to PE/cardiac arrest/COVID-19 pneumonia-self extubated on 11/23-currently stable on room air.  Acute metabolic encephalopathy/ICU delirium/possible anoxic injury: Mental status continues to slowly improve-getting more fluent with responses.  CT head without acute changes.  Continue supportive care.  Minimize all sedating medications.    Dysphagia: In the setting of cardiac arrest-seen by SLP-required NG tube-subsequently seen by SLP-diet gradually upgraded to a dysphagia 3 diet.  AKI: Likely hemodynamically mediated-had improved-but due to diarrhea worsening renal function today-have started IV fluids.  Hyponatremia/hypokalemia: I have started IV fluids-have started IV KCl supplementation.  Will replete and recheck.  COVID-19 pneumonia: Completed a course of Remdesivir-an steroids.  Hypoxia has resolved.  Diarrhea/leukocytosis-colitis on CT: Concern for C. difficile colitis-C. difficile PCR-GI pathogen panel ordered.   Empirically have started on oral vancomycin.   Paroxysmal atrial fibrillation: appears to be back in sinus rhythm-on anticoagulation for PE.  Echo as above.    Normocytic anemia: Secondary to critical illness-stable for monitoring-no indication for PRBC transfusion.  Transaminitis: Likely secondary to COVID-19-downtrending.  Deconditioning/debility: Secondary to critical illness- PT/OT eval completed-recommendations are for SNF  Hard of hearing  GI prophylaxis: PPI  ABG:    Component Value Date/Time   PHART 7.417 05/25/2020 0506   PCO2ART 45.4 05/25/2020 0506   PO2ART 164 (H) 05/25/2020 0506   HCO3 29.2 (H) 05/25/2020 0506   TCO2 31 05/25/2020 0506   ACIDBASEDEF 2.0 05/22/2020 1948   O2SAT 99.0 05/25/2020 0506    Vent Settings: N/A   Condition - Extremely Guarded  Family Communication  :  Daughter Kyshawn Teal [phone])  updated over the phone 11/30  Code Status :  Full Code  Diet :  Diet Order            DIET DYS 3 Room service appropriate? Yes; Fluid consistency: Thin  Diet effective now                  Disposition Plan  :   Status is: Inpatient  Remains inpatient appropriate because:Inpatient level of care appropriate due to severity of illness   Dispo: The patient is from: Home              Anticipated d/c is to: TBD              Anticipated d/c date is: > 3 days              Patient currently is not medically stable to d/c.   Barriers to discharge: Hypoxia requiring O2 supplementation/complete 5 days of IV Remdesivir  Antimicorbials  :    Anti-infectives (From admission, onward)   Start     Dose/Rate Route Frequency Ordered Stop   06/02/20 1000  vancomycin (VANCOCIN) 50 mg/mL oral solution 125 mg        125 mg Oral 4 times daily 06/02/20 0800 06/12/20 0959   05/22/20 1000  remdesivir 100 mg in sodium chloride 0.9 % 100 mL IVPB       "Followed by" Linked Group Details   100 mg 200 mL/hr over 30 Minutes Intravenous Daily 05/21/20 0857  05/25/20 1154   05/21/20 1030  remdesivir 200 mg in sodium chloride 0.9% 250 mL IVPB  Status:  Discontinued       "  Followed by" Linked Group Details   200 mg 580 mL/hr over 30 Minutes Intravenous Once 05/21/20 0857 05/27/20 0734   05/21/20 0330  metroNIDAZOLE (FLAGYL) tablet 500 mg        500 mg Oral  Once 05/21/20 0320 05/21/20 0411   05/20/20 2330  cefTRIAXone (ROCEPHIN) 2 g in sodium chloride 0.9 % 100 mL IVPB        2 g 200 mL/hr over 30 Minutes Intravenous  Once 05/20/20 2324 05/21/20 0331      Inpatient Medications  Scheduled Meds: . apixaban  10 mg Oral BID   Followed by  . [START ON 06/07/2020] apixaban  5 mg Oral BID  . Chlorhexidine Gluconate Cloth  6 each Topical Daily  . feeding supplement  237 mL Oral TID BM  . insulin aspart  0-9 Units Subcutaneous Q4H  . pantoprazole  40 mg Oral Daily  . sodium chloride flush  10-40 mL Intracatheter Q12H  . vancomycin  125 mg Oral QID   Continuous Infusions: . lactated ringers with kcl 100 mL/hr at 06/02/20 0915   PRN Meds:.acetaminophen (TYLENOL) oral liquid 160 mg/5 mL, [DISCONTINUED] ondansetron **OR** ondansetron (ZOFRAN) IV, sodium chloride flush   Time Spent in minutes  25   See all Orders from today for further details   Jeoffrey Massed M.D on 06/02/2020 at 11:24 AM  To page go to [email or web address] - use universal password  [facility] -  Office  [phone]    Objective:   Vitals:   06/02/20 0008 06/02/20 0300 06/02/20 0335 06/02/20 0639  BP:  (!) 91/58    Pulse:  89    Resp: 20 20 19    Temp:  97.9 F (36.6 C)    TempSrc:  Axillary    SpO2:  94%    Weight:    62 kg  Height:        Wt Readings from Last 3 Encounters:  06/02/20 62 kg  12/20/14 66 kg     Intake/Output Summary (Last 24 hours) at 06/02/2020 1124 Last data filed at 06/02/2020 0851 Gross per 24 hour  Intake 850 ml  Output 500 ml  Net 350 ml     Physical Exam  Gen Exam:Alert awake-not in any distress HEENT:atraumatic,  normocephalic Chest: B/L clear to auscultation anteriorly CVS:S1S2 regular Abdomen: Soft-minimal left lower quadrant abdominal pain-no rebound. Extremities:no edema Neurology: Non focal Skin: no rash   Data Review:    CBC Recent Labs  Lab 05/29/20 0341 05/30/20 0208 05/31/20 0247 06/01/20 0319 06/02/20 0134  WBC 19.3* 20.9* 24.1* 24.7* 19.5*  HGB 10.4* 11.0* 11.2* 10.3* 9.2*  HCT 32.6* 34.2* 34.2* 30.9* 27.1*  PLT 296 284 246 236 213  MCV 91.6 89.5 88.1 87.0 85.0  MCH 29.2 28.8 28.9 29.0 28.8  MCHC 31.9 32.2 32.7 33.3 33.9  RDW 15.0 15.1 15.2 14.8 14.1    Chemistries  Recent Labs  Lab 05/27/20 0500 05/27/20 0500 05/28/20 0811 05/29/20 0341 05/30/20 0208 05/31/20 0247 06/02/20 0134  NA 142   < > 146* 144 142 136 123*  K 4.0   < > 3.7 3.6 3.8 3.7 2.9*  CL 106   < > 110 109 109 102 92*  CO2 27   < > 26 27 25 24  21*  GLUCOSE 137*   < > 130* 124* 105* 105* 152*  BUN 24*   < > 29* 30* 24* 20 21  CREATININE 1.00   < > 0.97 0.89 0.93 1.04 1.41*  CALCIUM 8.6*   < >  8.4* 8.7* 8.4* 8.1* 7.3*  MG  --   --   --  1.9  --   --   --   AST 50*  --   --  49* 59*  --  43*  ALT 48*  --   --  52* 73*  --  53*  ALKPHOS 89  --   --  119 139*  --  131*  BILITOT 0.6  --   --  0.9 1.0  --  1.3*   < > = values in this interval not displayed.   ------------------------------------------------------------------------------------------------------------------ No results for input(s): CHOL, HDL, LDLCALC, TRIG, CHOLHDL, LDLDIRECT in the last 72 hours.  Lab Results  Component Value Date   HGBA1C 5.5 05/22/2020   ------------------------------------------------------------------------------------------------------------------ No results for input(s): TSH, T4TOTAL, T3FREE, THYROIDAB in the last 72 hours.  Invalid input(s): FREET3 ------------------------------------------------------------------------------------------------------------------ No results for input(s): VITAMINB12,  FOLATE, FERRITIN, TIBC, IRON, RETICCTPCT in the last 72 hours.  Coagulation profile No results for input(s): INR, PROTIME in the last 168 hours.  No results for input(s): DDIMER in the last 72 hours.  Cardiac Enzymes No results for input(s): CKMB, TROPONINI, MYOGLOBIN in the last 168 hours.  Invalid input(s): CK ------------------------------------------------------------------------------------------------------------------    Component Value Date/Time   BNP 499.2 (H) 05/23/2020 1244    Micro Results No results found for this or any previous visit (from the past 240 hour(s)).  Radiology Reports CT ABDOMEN PELVIS WO CONTRAST  Result Date: 06/01/2020 CLINICAL DATA:  Left upper quadrant diarrhea, history of COVID-19 pneumonia. EXAM: CT ABDOMEN AND PELVIS WITHOUT CONTRAST TECHNIQUE: Multidetector CT imaging of the abdomen and pelvis was performed following the standard protocol without IV contrast. COMPARISON:  05/21/2020 FINDINGS: Lower chest: Bibasilar consolidation is noted increased when compared with the prior exam. These changes are worse on the left than the right. Stable changes of prior COVID-19 pneumonia are noted. No sizable effusion is seen. Hepatobiliary: No focal liver abnormality is seen. No gallstones, gallbladder wall thickening, or biliary dilatation. Pancreas: Unremarkable. No pancreatic ductal dilatation or surrounding inflammatory changes. Spleen: Normal in size without focal abnormality. Adrenals/Urinary Tract: Adrenal glands are within normal limits. Kidneys demonstrate left renal pelvis stone stable in appearance. No obstructive changes are seen. The bladder is well distended. Stomach/Bowel: Diffuse edematous changes of the colonic wall are seen throughout its course increased when compared with the prior exam consistent with a generalized colitis. Some pericolonic inflammatory changes noted particularly on the right increased from the prior exam. The appendix appears  within normal limits. Small bowel and stomach are unremarkable. Vascular/Lymphatic: Atherosclerotic calcifications of the aorta are noted. IVC filter is noted in place. No sizable lymphadenopathy is noted. Reproductive: Prostate is unremarkable. Other: No abdominal wall hernia or abnormality. No abdominopelvic ascites. Changes of prior hernia repair are noted in the lower abdomen bilaterally Musculoskeletal: Degenerative changes of lumbar spine are noted. IMPRESSION: Increasing colonic wall thickness with pericolonic inflammatory change consistent with progressive colitis. Increasing bilateral lower lobe consolidation without sizable effusion. Some scattered changes consistent with the COVID-19 pneumonia are noted. Stable nonobstructing left renal stone. Electronically Signed   By: Alcide Clever M.D.   On: 06/01/2020 23:59   CT HEAD WO CONTRAST  Result Date: 05/26/2020 CLINICAL DATA:  69 year old male with altered mental status. EXAM: CT HEAD WITHOUT CONTRAST TECHNIQUE: Contiguous axial images were obtained from the base of the skull through the vertex without intravenous contrast. COMPARISON:  Head CT dated 04/18/2010. FINDINGS: Brain: The ventricles and sulci appropriate size for  patient's age. The gray-white matter discrimination is preserved. There is no acute intracranial hemorrhage. No mass effect midline shift. No extra-axial fluid collection. Vascular: No hyperdense vessel or unexpected calcification. Skull: Normal. Negative for fracture or focal lesion. Sinuses/Orbits: No acute finding. Other: None IMPRESSION: Unremarkable noncontrast CT of the brain. Electronically Signed   By: Elgie Collard M.D.   On: 05/26/2020 15:45   CT ANGIO CHEST PE W OR WO CONTRAST  Addendum Date: 05/21/2020   ADDENDUM REPORT: 05/21/2020 19:28 ADDENDUM: Critical Value/emergent results were called by telephone at the time of interpretation on 05/21/2020 at 7:27 pm to provider Dr. Dierdre Highman, who verbally acknowledged these  results. Of note the airspace disease in the LEFT lower lobe is progressed from CT same day. Differential remains the same. Electronically Signed   By: Genevive Bi M.D.   On: 05/21/2020 19:28   Result Date: 05/21/2020 CLINICAL DATA:  PE suspected, high probability.  COVID-19 infection EXAM: CT ANGIOGRAPHY CHEST WITH CONTRAST TECHNIQUE: Multidetector CT imaging of the chest was performed using the standard protocol during bolus administration of intravenous contrast. Multiplanar CT image reconstructions and MIPs were obtained to evaluate the vascular anatomy. CONTRAST:  75mL OMNIPAQUE IOHEXOL 300 MG/ML  SOLN COMPARISON:  None. FINDINGS: Cardiovascular: Large filling defect within the proximal LEFT lower lobe pulmonary artery. Filling defect spans the bifurcation of the LEFT lower lobe and lingular branch pulmonary branches. This this filling defect/thrombus is occlusive to the LEFT lower lobe. No LEFT upper lobe filling defects identified. No filling defect identified within the RIGHT lung pulmonary arteries. No evidence of RIGHT ventricular strain with the RV to LV ratio less than 1. Coronary artery calcification and aortic atherosclerotic calcification. Mediastinum/Nodes: No axillary or supraclavicular adenopathy. No mediastinal or hilar adenopathy. No pericardial fluid. Esophagus normal. Lungs/Pleura: There is diffuse very fine airspace disease within the LEFT lower lobe involving the majority of the LEFT lower lobe. Similar milder findings in the posterior RIGHT lower lobe and lingula. Upper Abdomen: Limited view of the liver, kidneys, pancreas are unremarkable. Normal adrenal glands. Musculoskeletal: No aggressive osseous lesion. Review of the MIP images confirms the above findings. IMPRESSION: 1. Large occlusive pulmonary embolism within the proximal LEFT lower lobe pulmonary artery and lingular pulmonary artery. 2. Diffuse fine airspace disease within the LEFT lower lobe with differential including  pulmonary infarction, pulmonary hemorrhage, or pneumonia (including COVID pneumonia). 3. No evidence of RIGHT ventricular strain. 4. Coronary artery calcification and aortic atherosclerotic calcification. Critical Value/emergent results were called by telephone at the time of interpretation on 05/21/2020 at 6:55 pm to provider [facility] , who verbally acknowledged these results. Electronically Signed: By: Genevive Bi M.D. On: 05/21/2020 18:59   IR IVC FILTER PLMT / S&I Lenise Arena GUID/MOD SED  Result Date: 05/24/2020 INDICATION: 69 year old with recent cardiac arrest likely secondary to pulmonary embolism and COVID-19. Subsequent right ventricular failure. Patient has clot in the left pulmonary arteries and clot in left femoral vein. There is concern that the patient would not tolerate additional clot burden in the pulmonary arteries and request for IVC filter placement. EXAM: IVC FILTER PLACEMENT; IVC VENOGRAM; ULTRASOUND FOR VASCULAR ACCESS Physician: Rachelle Hora. Lowella Dandy, MD MEDICATIONS: None. ANESTHESIA/SEDATION: Versed 1 mg The patient was continuously monitored during the procedure by the interventional radiology nurse under my direct supervision. CONTRAST:  Carbon dioxide FLUOROSCOPY TIME:  Fluoroscopy Time: 3 minutes, 18 seconds, 18 mGy COMPLICATIONS: None immediate. PROCEDURE: Informed consent was obtained for an IVC filter placement from the patient's daughter. Ultrasound demonstrated a  patent right internal jugular vein. Ultrasound images were obtained for documentation. The right neck was prepped and draped in a sterile fashion. Maximal barrier sterile technique was utilized including caps, mask, sterile gowns, sterile gloves, sterile drape, hand hygiene and skin antiseptic. The skin was anesthetized with 1% lidocaine. A 21 gauge needle was directed into the vein with ultrasound guidance and a micropuncture dilator set was placed. A wire was advanced into the IVC. The filter sheath was advanced over the  wire into the IVC. An IVC venogram was performed with carbon dioxide. Bilateral renal veins were cannulated using a 5 French catheter and Bentson wire. Bilateral renal veins are identified. Fluoroscopic images were obtained for documentation. A Bard Denali filter was deployed below the lowest renal vein. Post placement images of the filter were obtained. The vascular sheath was removed with manual compression. Bandage placed over the puncture site. FINDINGS: IVC was patent. Bilateral renal veins were identified. The filter was deployed below the lowest renal vein. IMPRESSION: Successful placement of a retrievable IVC filter. PLAN: This IVC filter is potentially retrievable. The patient will be assessed for filter retrieval by Interventional Radiology in approximately 8-12 weeks. Further recommendations regarding filter retrieval, continued surveillance or declaration of device permanence, will be made at that time. Electronically Signed   By: Richarda Overlie M.D.   On: 05/24/2020 17:57   DG CHEST PORT 1 VIEW  Result Date: 05/26/2020 CLINICAL DATA:  Acute respiratory failure.  Hypoxia. EXAM: PORTABLE CHEST 1 VIEW COMPARISON:  05/22/2020. FINDINGS: Interim extubation and removal of NG tube. Left IJ line in stable position. Heart size normal. Diffuse left lung infiltrate. Right base infiltrate. No prominent pleural effusion. No pneumothorax. IMPRESSION: 1. Interim extubation and removal of NG tube. Left IJ line stable position. 2. Diffuse left lung infiltrate and right base infiltrate. Electronically Signed   By: Maisie Fus  Register   On: 05/26/2020 05:50   DG CHEST PORT 1 VIEW  Result Date: 05/22/2020 CLINICAL DATA:  Central line placement, intubation EXAM: PORTABLE CHEST 1 VIEW COMPARISON:  05/21/2020 FINDINGS: Left central line has been placed with the tip in the SVC. No pneumothorax. Endotracheal tube is 5 cm above the carina. Patchy airspace disease throughout the left lung. No focal opacity on the right. Heart  is normal size. IMPRESSION: Endotracheal tube 5 cm above the carina. Left central line tip in the SVC. No pneumothorax. Stable patchy left lung airspace disease. Electronically Signed   By: Charlett Nose M.D.   On: 05/22/2020 21:06   DG Chest Port 1 View  Result Date: 05/21/2020 CLINICAL DATA:  Cough, lower back pain, has not eaten 5 days, emesis EXAM: PORTABLE CHEST 1 VIEW COMPARISON:  Radiograph 04/19/2010, CT 04/18/2010 FINDINGS: Heterogeneous opacities present predominantly along the periphery of the left lung and minimally in the right lung base as well. No pneumothorax or effusion. The aorta is calcified. The remaining cardiomediastinal contours are unremarkable. No acute osseous or soft tissue abnormality. Degenerative changes are present in the imaged spine and shoulders. Telemetry leads overlie the chest. IMPRESSION: Heterogeneous opacities throughout the lungs, greatest in the left lung periphery concerning for pneumonia including potential viral etiology. Aortic Atherosclerosis (ICD10-I70.0). Electronically Signed   By: Kreg Shropshire M.D.   On: 05/21/2020 03:50   DG Abd Portable 1V  Result Date: 05/23/2020 CLINICAL DATA:  Encounter for orogastric tube placement. EXAM: PORTABLE ABDOMEN - 1 VIEW COMPARISON:  06/09/2009 and chest radiograph 05/22/2020 FINDINGS: Gastric tube extends into the abdomen and the tip is in  the midline of the lower abdomen, likely in the distal stomach body region. Again noted are patchy densities at the left lung base. Few gas-filled loops of bowel in the abdomen. IMPRESSION: Orogastric tube in the lower abdominal midline and likely in the distal stomach body region. Electronically Signed   By: Richarda Overlie M.D.   On: 05/23/2020 12:17   ECHOCARDIOGRAM COMPLETE  Result Date: 05/23/2020    ECHOCARDIOGRAM REPORT   Patient Name:   ARJAN STROHM Date of Exam: 05/23/2020 Medical Rec #:  [id number]      Height:       72.0 in Accession #:    [id number]     Weight:       142.4 lb  Date of Birth:  Nov 02, 1950      BSA:          1.844 m Patient Age:    69 years       BP:           112/85 mmHg Patient Gender: M              HR:           96 bpm. Exam Location:  Inpatient Procedure: 2D Echo, Cardiac Doppler and Color Doppler STAT ECHO Indications:    I26.02 Pulmonary embolus  History:        Patient has no prior history of Echocardiogram examinations.                 Arrythmias:Cardiac Arrest; Signs/Symptoms:Dyspnea, Hypotension                 and Altered Mental Status. Covid 19 positive.  Sonographer:    Sheralyn Boatman RDCS Referring Phys: [id number] Martina Sinner  Sonographer Comments: Technically difficult study due to poor echo windows and echo performed with patient supine and on artificial respirator. IMPRESSIONS  1. Severe RV failure and cor pulmonale.  2. Abnormal septal motion and septal flattening consistent with RV pressure overload. Left ventricular ejection fraction, by estimation, is 50 to 55%. The left ventricle has low normal function. The left ventricle has no regional wall motion abnormalities. Left ventricular diastolic parameters were normal.  3. Right ventricular systolic function is severely reduced. The right ventricular size is severely enlarged. There is mildly elevated pulmonary artery systolic pressure.  4. The mitral valve is normal in structure. No evidence of mitral valve regurgitation. No evidence of mitral stenosis.  5. The aortic valve is normal in structure. Aortic valve regurgitation is not visualized. No aortic stenosis is present.  6. The inferior vena cava is dilated in size with <50% respiratory variability, suggesting right atrial pressure of 15 mmHg. FINDINGS  Left Ventricle: Abnormal septal motion and septal flattening consistent with RV pressure overload. Left ventricular ejection fraction, by estimation, is 50 to 55%. The left ventricle has low normal function. The left ventricle has no regional wall motion abnormalities. The left ventricular internal  cavity size was normal in size. There is no left ventricular hypertrophy. Left ventricular diastolic parameters were normal. Right Ventricle: The right ventricular size is severely enlarged. Right vetricular wall thickness was not assessed. Right ventricular systolic function is severely reduced. There is mildly elevated pulmonary artery systolic pressure. The tricuspid regurgitant velocity is 2.67 m/s, and with an assumed right atrial pressure of 8 mmHg, the estimated right ventricular systolic pressure is 36.5 mmHg. Left Atrium: Left atrial size was normal in size. Right Atrium: Right atrial size was normal in size. Pericardium: There is  no evidence of pericardial effusion. Mitral Valve: The mitral valve is normal in structure. No evidence of mitral valve regurgitation. No evidence of mitral valve stenosis. Tricuspid Valve: The tricuspid valve is normal in structure. Tricuspid valve regurgitation is mild . No evidence of tricuspid stenosis. Aortic Valve: The aortic valve is normal in structure. Aortic valve regurgitation is not visualized. No aortic stenosis is present. Pulmonic Valve: The pulmonic valve was normal in structure. Pulmonic valve regurgitation is not visualized. No evidence of pulmonic stenosis. Aorta: The aortic root is normal in size and structure. Venous: The inferior vena cava is dilated in size with less than 50% respiratory variability, suggesting right atrial pressure of 15 mmHg. IAS/Shunts: No atrial level shunt detected by color flow Doppler. Additional Comments: Severe RV failure and cor pulmonale.  LEFT VENTRICLE PLAX 2D LVIDd:         3.70 cm     Diastology LVIDs:         2.80 cm     LV e' medial:    4.46 cm/s LV PW:         1.10 cm     LV E/e' medial:  4.9 LV IVS:        1.10 cm     LV e' lateral:   6.97 cm/s LVOT diam:     2.10 cm     LV E/e' lateral: 3.1 LV SV:         28 LV SV Index:   15 LVOT Area:     3.46 cm  LV Volumes (MOD) LV vol d, MOD A2C: 22.6 ml LV vol d, MOD A4C: 25.5 ml  LV vol s, MOD A2C: 11.1 ml LV vol s, MOD A4C: 11.7 ml LV SV MOD A2C:     11.5 ml LV SV MOD A4C:     25.5 ml LV SV MOD BP:      13.7 ml RIGHT VENTRICLE            IVC RV S prime:     8.84 cm/s  IVC diam: 2.50 cm TAPSE (M-mode): 1.3 cm LEFT ATRIUM           Index      RIGHT ATRIUM           Index LA diam:      2.80 cm 1.52 cm/m RA Area:     14.30 cm LA Vol (A4C): 10.5 ml 5.69 ml/m RA Volume:   42.70 ml  23.15 ml/m  AORTIC VALVE LVOT Vmax:   53.80 cm/s LVOT Vmean:  41.900 cm/s LVOT VTI:    0.080 m  AORTA Ao Root diam: 3.60 cm Ao Asc diam:  3.50 cm MITRAL VALVE               TRICUSPID VALVE MV Area (PHT): 3.27 cm    TR Peak grad:   28.5 mmHg MV Decel Time: 232 msec    TR Vmax:        267.00 cm/s MV E velocity: 21.94 cm/s MV A velocity: 41.30 cm/s  SHUNTS MV E/A ratio:  0.53        Systemic VTI:  0.08 m                            Systemic Diam: 2.10 cm Charlton Haws MD Electronically signed by Charlton Haws MD Signature Date/Time: 05/23/2020/10:46:38 AM    Final    CT RENAL STONE STUDY  Result Date: 05/21/2020  CLINICAL DATA:  69 year old male with low back pain, flank pain, decreased p.o. EXAM: CT ABDOMEN AND PELVIS WITHOUT CONTRAST TECHNIQUE: Multidetector CT imaging of the abdomen and pelvis was performed following the standard protocol without IV contrast. COMPARISON:  Noncontrast CT Abdomen and Pelvis 10/28/2008. Portable chest radiograph 04/19/2010. FINDINGS: Lower chest: Patchy, irregular peribronchial and peripheral scattered pulmonary opacity at both lung bases. This seems to be new from last month. Underlying probable pulmonary hyperinflation. Some associated lung base bronchiectasis. No cavitating areas identified. No cardiomegaly, pericardial effusion or pleural effusion. Hepatobiliary: Negative noncontrast liver and gallbladder. Pancreas: Negative. Spleen: Negative. Adrenals/Urinary Tract: Bulky left nephrolithiasis measuring 13 mm at the renal pelvis. Additional left lower pole stone. No  hydronephrosis. Negative noncontrast right kidney. No hydroureter. Unremarkable urinary bladder. Incidental pelvic phleboliths. Stomach/Bowel: Decompressed large bowel. There is long segment circumferential wall thickening in the right colon, up to the 12 mm series 4, image 49) with some areas of adjacent mesenteric stranding (coronal image 43). The wall thickening gradually decreases through the transverse colon. No free air. No free fluid. The terminal ileum appear spared. Appendix seems to remain normal on coronal image 32. No dilated small bowel.  Decompressed stomach and duodenum. Vascular/Lymphatic: Normal caliber abdominal aorta. Mild calcified atherosclerosis. Vascular patency is not evaluated in the absence of IV contrast. No lymphadenopathy identified in the absence of contrast. Reproductive: Negative. Other: Previous lower abdominal ventral hernia repair with mesh. No pelvic free fluid. Musculoskeletal: Degenerative changes in the spine. No acute osseous abnormality identified. IMPRESSION: 1. In the abdomen there is circumferential bowel wall thickening and mesenteric stranding involving the right colon. Wall thickening gradually abates through the transverse colon. This is compatible with Acute Nonspecific Colitis. 2. But the lung bases are also abnormal with patchy and irregular peribronchial and peripheral pulmonary opacity suspicious for acute viral/atypical respiratory infection. No areas of cavitation to suggest septic emboli. No pleural effusion. 3. Bulky left nephrolithiasis, but no obstructive uropathy. 4. Previous lower abdominal hernia repair with mesh. Electronically Signed   By: Odessa Fleming M.D.   On: 05/21/2020 02:46   VAS [place] LOWER EXTREMITY VENOUS (DVT)  Result Date: 05/24/2020  Lower Venous DVT Study Indications: Covid-19, pulmonary embolism.  Risk Factors: Confirmed PE. Anticoagulation: Heparin. Limitations: Body habitus, ventilation, did not perform compressions in thigh and popliteal  fossa of the left lower extremity secondary to mobile thrombus, bandages and line. Comparison Study: No prior study Performing Technologist: Sherren Kerns RVS  Examination Guidelines: A complete evaluation includes B-mode imaging, spectral Doppler, color Doppler, and power Doppler as needed of all accessible portions of each vessel. Bilateral testing is considered an integral part of a complete examination. Limited examinations for reoccurring indications may be performed as noted. The reflux portion of the exam is performed with the patient in reverse Trendelenburg.  +---------+---------------+---------+-----------+----------+--------------+ RIGHT    CompressibilityPhasicitySpontaneityPropertiesThrombus Aging +---------+---------------+---------+-----------+----------+--------------+ CFV      Full           Yes      No                                  +---------+---------------+---------+-----------+----------+--------------+ SFJ      Full                                                        +---------+---------------+---------+-----------+----------+--------------+  FV Prox  Full                                                        +---------+---------------+---------+-----------+----------+--------------+ FV Mid   Full                                                        +---------+---------------+---------+-----------+----------+--------------+ FV DistalFull                                                        +---------+---------------+---------+-----------+----------+--------------+ PFV      Full                                                        +---------+---------------+---------+-----------+----------+--------------+ POP      Full           Yes      No                                  +---------+---------------+---------+-----------+----------+--------------+ PTV      Full                                                         +---------+---------------+---------+-----------+----------+--------------+ PERO     Full                                                        +---------+---------------+---------+-----------+----------+--------------+   +---------+---------------+---------+-----------+----------+-------------------+ LEFT     CompressibilityPhasicitySpontaneityPropertiesThrombus Aging      +---------+---------------+---------+-----------+----------+-------------------+ CFV                                                   not visualized                                                            secondary to  bandage/line        +---------+---------------+---------+-----------+----------+-------------------+ SFJ                                                   not visualized                                                            secondary to                                                              bandage/line        +---------+---------------+---------+-----------+----------+-------------------+ FV Prox  Full                                         patent by color     +---------+---------------+---------+-----------+----------+-------------------+ FV Mid                                                patent by color     +---------+---------------+---------+-----------+----------+-------------------+ FV Distal                                             patent by color     +---------+---------------+---------+-----------+----------+-------------------+ PFV                                                   patent by color     +---------+---------------+---------+-----------+----------+-------------------+ POP                                                   patent by color     +---------+---------------+---------+-----------+----------+-------------------+ PTV      Full                                                              +---------+---------------+---------+-----------+----------+-------------------+ PERO     Full                                                             +---------+---------------+---------+-----------+----------+-------------------+  Summary: RIGHT: - There is no evidence of deep vein thrombosis in the lower extremity.  vessels are dilated with significantly sluggish flow, throughout  LEFT: - Findings consistent with acute deep vein thrombosis involving the left femoral vein. - Acute, mobile thrombus noted in the proximal and mid femoral vein. Vessels are dilated with significantly sluggish flow, throughout.  *See table(s) above for measurements and observations. Electronically signed by Waverly Ferrarihristopher Dickson MD on 05/24/2020 at 6:21:22 PM.    Final

## 2020-06-02 NOTE — Progress Notes (Signed)
Patient had several loose BMs early yesterday and was given imodium. Per night shift RN, had two loose BMs last night as well.

## 2020-06-03 LAB — CBC
HCT: 26.7 % — ABNORMAL LOW (ref 39.0–52.0)
Hemoglobin: 9.3 g/dL — ABNORMAL LOW (ref 13.0–17.0)
MCH: 29.2 pg (ref 26.0–34.0)
MCHC: 34.8 g/dL (ref 30.0–36.0)
MCV: 84 fL (ref 80.0–100.0)
Platelets: 250 10*3/uL (ref 150–400)
RBC: 3.18 MIL/uL — ABNORMAL LOW (ref 4.22–5.81)
RDW: 14 % (ref 11.5–15.5)
WBC: 17 10*3/uL — ABNORMAL HIGH (ref 4.0–10.5)
nRBC: 0 % (ref 0.0–0.2)

## 2020-06-03 LAB — GLUCOSE, CAPILLARY
Glucose-Capillary: 101 mg/dL — ABNORMAL HIGH (ref 70–99)
Glucose-Capillary: 110 mg/dL — ABNORMAL HIGH (ref 70–99)
Glucose-Capillary: 152 mg/dL — ABNORMAL HIGH (ref 70–99)
Glucose-Capillary: 153 mg/dL — ABNORMAL HIGH (ref 70–99)
Glucose-Capillary: 93 mg/dL (ref 70–99)
Glucose-Capillary: 99 mg/dL (ref 70–99)

## 2020-06-03 LAB — GASTROINTESTINAL PANEL BY PCR, STOOL (REPLACES STOOL CULTURE)

## 2020-06-03 LAB — COMPREHENSIVE METABOLIC PANEL
ALT: 57 U/L — ABNORMAL HIGH (ref 0–44)
AST: 46 U/L — ABNORMAL HIGH (ref 15–41)
Albumin: 1.5 g/dL — ABNORMAL LOW (ref 3.5–5.0)
Alkaline Phosphatase: 129 U/L — ABNORMAL HIGH (ref 38–126)
Anion gap: 8 (ref 5–15)
BUN: 12 mg/dL (ref 8–23)
CO2: 21 mmol/L — ABNORMAL LOW (ref 22–32)
Calcium: 7.5 mg/dL — ABNORMAL LOW (ref 8.9–10.3)
Chloride: 99 mmol/L (ref 98–111)
Creatinine, Ser: 0.99 mg/dL (ref 0.61–1.24)
GFR, Estimated: >60 mL/min (ref >60)
Glucose, Bld: 105 mg/dL — ABNORMAL HIGH (ref 70–99)
Potassium: 3.9 mmol/L (ref 3.5–5.1)
Sodium: 128 mmol/L — ABNORMAL LOW (ref 135–145)
Total Bilirubin: 1.2 mg/dL (ref 0.3–1.2)
Total Protein: 4.2 g/dL — ABNORMAL LOW (ref 6.5–8.1)

## 2020-06-03 LAB — MAGNESIUM: Magnesium: 1.7 mg/dL (ref 1.7–2.4)

## 2020-06-03 MED ORDER — MAGNESIUM SULFATE 2 GM/50ML IV SOLN
2.0000 g | Freq: Once | INTRAVENOUS | Status: AC
  Administered 2020-06-03: 2 g via INTRAVENOUS
  Filled 2020-06-03: qty 50

## 2020-06-03 NOTE — Progress Notes (Signed)
PROGRESS NOTE                                                                                                                                                                                                             Patient Demographics:    Dwayne Barnes, is a 69 y.o. male, DOB - 1951-02-13, ZOX:[id number]  Outpatient Primary MD for the patient is [name], PA-C   Admit date - 05/20/2020   LOS - 13  Chief Complaint  Patient presents with  . Fatigue       Brief Narrative: Patient is a 69 y.o. male with no significant past medical history-presented to the ED on 11/17 with nausea/vomiting, weakness and back pain-further evaluation revealed COVID-19 infection and a large PE.  Patient was started on IV heparin infusion along with steroid/Remdesivir-on 11/19-patient had a cardiac arrest-was intubated during the code-total downtime of around 6 minutes before ROSC.  Patient was subsequently transferred to the ICU-unfortunately soon after arrival in the ICU-he had another cardiac arrest-and 4 rounds of CPR were provided with ROSC.  See below for further details.   COVID-19 vaccinated status: Vaccinated  Significant Events: 11/17>> Admit to [facility] for PE/COVID-19 infection 11/19>> PEA cardiac arrest x 2-given TPA-intubated-transfer to ICU 11/23>> Self extubated 11/25>> transfer to [facility] 11/29>> worsening diarrhea-CT abdomen with progressive colitis  Significant studies: 11/18>>CT renal stone: non specific colitis, GGO's, left nephrolithiasis without obstructive uropathy. 11/18>>CTA chest: large PC in LLL, GGO's LLL. 11/20 echo>> EF 50-55%, RV systolic function severely reduced. 11/21>> B/L extremity Doppler: acute DVTin left femoral vein, acute mobile thrombus in the proximal femoral vein. 11/23>>CT Head: unremarkable   11/29>> CT abdomen/pelvis>> progressive colitis  COVID-19 medications: Steroids: 11/17>>11/26 Remdesivir:  11/17>> 11/22  Antibiotics: Oral vancomycin: 11/30>>  Microbiology data: 11/18>> urine culture: Insignificant growth 11/30>> stool C. difficile: +ve 11/30>> GI pathogen panel: negative  Procedures: ETT>>11/19 - 11/23 IVC filter placement by IR>>11/21.  Consults: PCCM, IR  DVT prophylaxis: Therapeutic Lovenox>> Eliquis    Subjective:   Diarrhea slowing down per nursing staff.  Patient claims LLQ pain has improved.   Assessment  & Plan :   Cardiac arrest due to large PE: S/p TPA-was on therapeutic Lovenox-we will transition to Eliquis today. PE likely provoked by COVID-19 infection.  PCCM MD discussed with Dr. Illa Level indication for Impella support.  Continue telemetry  monitoring.  Echo as above.  Large PE with DVT: Continue therapeutic anticoagulation-he is s/p TPA on 11/19 when he had cardiac arrest.  Patient is s/p IVC filter given mobile thrombus seen on lower extremity Doppler.  Once oral intake was stabilized-he was transitioned to Eliquis.  Acute hypoxic respiratory failure: Due to PE/cardiac arrest/COVID-19 pneumonia-self extubated on 11/23-currently stable on room air.  Acute metabolic encephalopathy/ICU delirium/possible anoxic injury: Mental status continues to slowly improve-getting more fluent with responses.  CT head without acute changes.  Continue supportive care.  Minimize all sedating medications.    Dysphagia: In the setting of cardiac arrest-seen by SLP-required NG tube-subsequently seen by SLP-diet gradually upgraded to a dysphagia 3 diet.  C. difficile colitis: Diarrhea improving-leukocytosis/AKI all getting better.  Continue oral vancomycin.  AKI: Likely hemodynamically mediated-due to diarrhea-improving.  Hyponatremia: Due to diarrhea/volume loss-improved with gentle hydration.  Hypokalemia: Repleted.  COVID-19 pneumonia: Completed a course of Remdesivir-an steroids.  Hypoxia has resolved.   Paroxysmal atrial fibrillation: appears to be back  in sinus rhythm-on anticoagulation for PE.  Echo as above.    Normocytic anemia: Secondary to critical illness-stable for monitoring-no indication for PRBC transfusion.  Transaminitis: Likely secondary to COVID-19-downtrending.  Deconditioning/debility: Secondary to critical illness- PT/OT eval completed-recommendations are for SNF  Hard of hearing  GI prophylaxis: PPI  ABG:    Component Value Date/Time   PHART 7.417 05/25/2020 0506   PCO2ART 45.4 05/25/2020 0506   PO2ART 164 (H) 05/25/2020 0506   HCO3 29.2 (H) 05/25/2020 0506   TCO2 31 05/25/2020 0506   ACIDBASEDEF 2.0 05/22/2020 1948   O2SAT 99.0 05/25/2020 0506    Vent Settings: N/A   Condition - Extremely Guarded  Family Communication  :  Daughter [name]([name] [phone])  updated over the phone 12/1  Code Status :  Full Code  Diet :  Diet Order            DIET DYS 3 Room service appropriate? Yes; Fluid consistency: Thin  Diet effective now                  Disposition Plan  :   Status is: Inpatient  Remains inpatient appropriate because:Inpatient level of care appropriate due to severity of illness   Dispo: The patient is from: Home              Anticipated d/c is to: SNF              Anticipated d/c date is: > 3 days              Patient currently is not medically stable to d/c.   Barriers to discharge: C Diff colitis-AKI-Hyponatremia/Hypokalemia  Antimicorbials  :    Anti-infectives (From admission, onward)   Start     Dose/Rate Route Frequency Ordered Stop   06/02/20 1000  vancomycin (VANCOCIN) 50 mg/mL oral solution 125 mg        125 mg Oral 4 times daily 06/02/20 0800 06/12/20 0959   05/22/20 1000  remdesivir 100 mg in sodium chloride 0.9 % 100 mL IVPB       "Followed by" Linked Group Details   100 mg 200 mL/hr over 30 Minutes Intravenous Daily 05/21/20 0857 05/25/20 1154   05/21/20 1030  remdesivir 200 mg in sodium chloride 0.9% 250 mL IVPB  Status:  Discontinued       "Followed by"  Linked Group Details   200 mg 580 mL/hr over 30 Minutes Intravenous Once 05/21/20 0857 05/27/20 0734  05/21/20 0330  metroNIDAZOLE (FLAGYL) tablet 500 mg        500 mg Oral  Once 05/21/20 0320 05/21/20 0411   05/20/20 2330  cefTRIAXone (ROCEPHIN) 2 g in sodium chloride 0.9 % 100 mL IVPB        2 g 200 mL/hr over 30 Minutes Intravenous  Once 05/20/20 2324 05/21/20 0331      Inpatient Medications  Scheduled Meds: . apixaban  10 mg Oral BID   Followed by  . [START ON 06/07/2020] apixaban  5 mg Oral BID  . Chlorhexidine Gluconate Cloth  6 each Topical Daily  . feeding supplement  237 mL Oral TID BM  . insulin aspart  0-9 Units Subcutaneous Q4H  . pantoprazole  40 mg Oral Daily  . sodium chloride flush  10-40 mL Intracatheter Q12H  . vancomycin  125 mg Oral QID   Continuous Infusions: . lactated ringers with kcl 75 mL/hr at 06/03/20 1004   PRN Meds:.acetaminophen (TYLENOL) oral liquid 160 mg/5 mL, [DISCONTINUED] ondansetron **OR** ondansetron (ZOFRAN) IV, sodium chloride flush   Time Spent in minutes  25   See all Orders from today for further details   Jeoffrey Massed M.D on 06/03/2020 at 12:50 PM  To page go to [email or web address] - use universal password  [facility] -  Office  [phone]    Objective:   Vitals:   06/02/20 1344 06/02/20 2059 06/03/20 0509 06/03/20 0910  BP:  (!) 110/57 106/78 107/62  Pulse:  89 71 83  Resp:  19 14 (!) 21  Temp:  98.9 F (37.2 C) 98.3 F (36.8 C) 98.2 F (36.8 C)  TempSrc:  Oral Oral Oral  SpO2: 90% 97% 93% 100%  Weight:   63.6 kg   Height:        Wt Readings from Last 3 Encounters:  06/03/20 63.6 kg  12/20/14 66 kg     Intake/Output Summary (Last 24 hours) at 06/03/2020 1250 Last data filed at 06/03/2020 1101 Gross per 24 hour  Intake 2093.13 ml  Output 2877 ml  Net -783.87 ml     Physical Exam  Gen Exam:Alert awake-not in any distress HEENT:atraumatic, normocephalic Chest: B/L clear to auscultation  anteriorly CVS:S1S2 regular Abdomen:soft non tender, non distended Extremities:no edema Neurology: Non focal Skin: no rash   Data Review:    CBC Recent Labs  Lab 05/30/20 0208 05/31/20 0247 06/01/20 0319 06/02/20 0134 06/03/20 0117  WBC 20.9* 24.1* 24.7* 19.5* 17.0*  HGB 11.0* 11.2* 10.3* 9.2* 9.3*  HCT 34.2* 34.2* 30.9* 27.1* 26.7*  PLT 284 246 236 213 250  MCV 89.5 88.1 87.0 85.0 84.0  MCH 28.8 28.9 29.0 28.8 29.2  MCHC 32.2 32.7 33.3 33.9 34.8  RDW 15.1 15.2 14.8 14.1 14.0    Chemistries  Recent Labs  Lab 05/29/20 0341 05/29/20 0341 05/30/20 0208 05/30/20 0208 05/31/20 0247 06/02/20 0134 06/02/20 1203 06/02/20 1837 06/03/20 0117  NA 144   < > 142   < > 136 123* 124* 126* 128*  K 3.6   < > 3.8   < > 3.7 2.9* 3.5 3.2* 3.9  CL 109   < > 109   < > 102 92* 94* 95* 99  CO2 27   < > 25   < > 24 21* 22 23 21*  GLUCOSE 124*   < > 105*   < > 105* 152* 116* 119* 105*  BUN 30*   < > 24*   < > 20 21 15 13  12  CREATININE 0.89   < > 0.93   < > 1.04 1.41* 1.11 1.02 0.99  CALCIUM 8.7*   < > 8.4*   < > 8.1* 7.3* 7.4* 7.6* 7.5*  MG 1.9  --   --   --   --   --   --   --  1.7  AST 49*  --  59*  --   --  43*  --   --  46*  ALT 52*  --  73*  --   --  53*  --   --  57*  ALKPHOS 119  --  139*  --   --  131*  --   --  129*  BILITOT 0.9  --  1.0  --   --  1.3*  --   --  1.2   < > = values in this interval not displayed.   ------------------------------------------------------------------------------------------------------------------ No results for input(s): CHOL, HDL, LDLCALC, TRIG, CHOLHDL, LDLDIRECT in the last 72 hours.  Lab Results  Component Value Date   HGBA1C 5.5 05/22/2020   ------------------------------------------------------------------------------------------------------------------ No results for input(s): TSH, T4TOTAL, T3FREE, THYROIDAB in the last 72 hours.  Invalid input(s):  FREET3 ------------------------------------------------------------------------------------------------------------------ No results for input(s): VITAMINB12, FOLATE, FERRITIN, TIBC, IRON, RETICCTPCT in the last 72 hours.  Coagulation profile No results for input(s): INR, PROTIME in the last 168 hours.  No results for input(s): DDIMER in the last 72 hours.  Cardiac Enzymes No results for input(s): CKMB, TROPONINI, MYOGLOBIN in the last 168 hours.  Invalid input(s): CK ------------------------------------------------------------------------------------------------------------------    Component Value Date/Time   BNP 499.2 (H) 05/23/2020 1244    Micro Results Recent Results (from the past 240 hour(s))  C Difficile Quick Screen (NO PCR Reflex)     Status: Abnormal   Collection Time: 06/02/20  8:41 AM   Specimen: STOOL  Result Value Ref Range Status   C Diff antigen POSITIVE (A) NEGATIVE Final   C Diff toxin POSITIVE (A) NEGATIVE Final   C Diff interpretation Toxin producing C. difficile detected.  Final    Comment: CRITICAL RESULT CALLED TO, READ BACK BY AND VERIFIED WITH: Tenna Child RN 12:05 06/02/20 ([email or web address]) Performed at [facility], [address], [place], [place] [address]   Gastrointestinal Panel by PCR , Stool     Status: None   Collection Time: 06/02/20  9:41 AM   Specimen: STOOL  Result Value Ref Range Status   Campylobacter species NOT DETECTED NOT DETECTED Final   Plesimonas shigelloides NOT DETECTED NOT DETECTED Final   Salmonella species NOT DETECTED NOT DETECTED Final   Yersinia enterocolitica NOT DETECTED NOT DETECTED Final   Vibrio species NOT DETECTED NOT DETECTED Final   Vibrio cholerae NOT DETECTED NOT DETECTED Final   Enteroaggregative E coli (EAEC) NOT DETECTED NOT DETECTED Final   Enteropathogenic E coli (EPEC) NOT DETECTED NOT DETECTED Final   Enterotoxigenic E coli (ETEC) NOT DETECTED NOT DETECTED Final   Shiga like toxin producing E coli  (STEC) NOT DETECTED NOT DETECTED Final   Shigella/Enteroinvasive E coli (EIEC) NOT DETECTED NOT DETECTED Final   Cryptosporidium NOT DETECTED NOT DETECTED Final   Cyclospora cayetanensis NOT DETECTED NOT DETECTED Final   Entamoeba histolytica NOT DETECTED NOT DETECTED Final   Giardia lamblia NOT DETECTED NOT DETECTED Final   Adenovirus F40/41 NOT DETECTED NOT DETECTED Final   Astrovirus NOT DETECTED NOT DETECTED Final   Norovirus GI/GII NOT DETECTED NOT DETECTED Final   Rotavirus A NOT DETECTED NOT DETECTED Final  Sapovirus (I, II, IV, and V) NOT DETECTED NOT DETECTED Final    Comment: Performed at [facility], [address] [place], [place] [address]    Radiology Reports CT ABDOMEN PELVIS WO CONTRAST  Result Date: 06/01/2020 CLINICAL DATA:  Left upper quadrant diarrhea, history of COVID-19 pneumonia. EXAM: CT ABDOMEN AND PELVIS WITHOUT CONTRAST TECHNIQUE: Multidetector CT imaging of the abdomen and pelvis was performed following the standard protocol without IV contrast. COMPARISON:  05/21/2020 FINDINGS: Lower chest: Bibasilar consolidation is noted increased when compared with the prior exam. These changes are worse on the left than the right. Stable changes of prior COVID-19 pneumonia are noted. No sizable effusion is seen. Hepatobiliary: No focal liver abnormality is seen. No gallstones, gallbladder wall thickening, or biliary dilatation. Pancreas: Unremarkable. No pancreatic ductal dilatation or surrounding inflammatory changes. Spleen: Normal in size without focal abnormality. Adrenals/Urinary Tract: Adrenal glands are within normal limits. Kidneys demonstrate left renal pelvis stone stable in appearance. No obstructive changes are seen. The bladder is well distended. Stomach/Bowel: Diffuse edematous changes of the colonic wall are seen throughout its course increased when compared with the prior exam consistent with a generalized colitis. Some pericolonic inflammatory changes  noted particularly on the right increased from the prior exam. The appendix appears within normal limits. Small bowel and stomach are unremarkable. Vascular/Lymphatic: Atherosclerotic calcifications of the aorta are noted. IVC filter is noted in place. No sizable lymphadenopathy is noted. Reproductive: Prostate is unremarkable. Other: No abdominal wall hernia or abnormality. No abdominopelvic ascites. Changes of prior hernia repair are noted in the lower abdomen bilaterally Musculoskeletal: Degenerative changes of lumbar spine are noted. IMPRESSION: Increasing colonic wall thickness with pericolonic inflammatory change consistent with progressive colitis. Increasing bilateral lower lobe consolidation without sizable effusion. Some scattered changes consistent with the COVID-19 pneumonia are noted. Stable nonobstructing left renal stone. Electronically Signed   By: Alcide Clever M.D.   On: 06/01/2020 23:59   CT HEAD WO CONTRAST  Result Date: 05/26/2020 CLINICAL DATA:  69 year old male with altered mental status. EXAM: CT HEAD WITHOUT CONTRAST TECHNIQUE: Contiguous axial images were obtained from the base of the skull through the vertex without intravenous contrast. COMPARISON:  Head CT dated 04/18/2010. FINDINGS: Brain: The ventricles and sulci appropriate size for patient's age. The gray-white matter discrimination is preserved. There is no acute intracranial hemorrhage. No mass effect midline shift. No extra-axial fluid collection. Vascular: No hyperdense vessel or unexpected calcification. Skull: Normal. Negative for fracture or focal lesion. Sinuses/Orbits: No acute finding. Other: None IMPRESSION: Unremarkable noncontrast CT of the brain. Electronically Signed   By: Elgie Collard M.D.   On: 05/26/2020 15:45   CT ANGIO CHEST PE W OR WO CONTRAST  Addendum Date: 05/21/2020   ADDENDUM REPORT: 05/21/2020 19:28 ADDENDUM: Critical Value/emergent results were called by telephone at the time of interpretation  on 05/21/2020 at 7:27 pm to provider Dr. Dierdre Highman, who verbally acknowledged these results. Of note the airspace disease in the LEFT lower lobe is progressed from CT same day. Differential remains the same. Electronically Signed   By: Genevive Bi M.D.   On: 05/21/2020 19:28   Result Date: 05/21/2020 CLINICAL DATA:  PE suspected, high probability.  COVID-19 infection EXAM: CT ANGIOGRAPHY CHEST WITH CONTRAST TECHNIQUE: Multidetector CT imaging of the chest was performed using the standard protocol during bolus administration of intravenous contrast. Multiplanar CT image reconstructions and MIPs were obtained to evaluate the vascular anatomy. CONTRAST:  75mL OMNIPAQUE IOHEXOL 300 MG/ML  SOLN COMPARISON:  None.  FINDINGS: Cardiovascular: Large filling defect within the proximal LEFT lower lobe pulmonary artery. Filling defect spans the bifurcation of the LEFT lower lobe and lingular branch pulmonary branches. This this filling defect/thrombus is occlusive to the LEFT lower lobe. No LEFT upper lobe filling defects identified. No filling defect identified within the RIGHT lung pulmonary arteries. No evidence of RIGHT ventricular strain with the RV to LV ratio less than 1. Coronary artery calcification and aortic atherosclerotic calcification. Mediastinum/Nodes: No axillary or supraclavicular adenopathy. No mediastinal or hilar adenopathy. No pericardial fluid. Esophagus normal. Lungs/Pleura: There is diffuse very fine airspace disease within the LEFT lower lobe involving the majority of the LEFT lower lobe. Similar milder findings in the posterior RIGHT lower lobe and lingula. Upper Abdomen: Limited view of the liver, kidneys, pancreas are unremarkable. Normal adrenal glands. Musculoskeletal: No aggressive osseous lesion. Review of the MIP images confirms the above findings. IMPRESSION: 1. Large occlusive pulmonary embolism within the proximal LEFT lower lobe pulmonary artery and lingular pulmonary artery. 2. Diffuse  fine airspace disease within the LEFT lower lobe with differential including pulmonary infarction, pulmonary hemorrhage, or pneumonia (including COVID pneumonia). 3. No evidence of RIGHT ventricular strain. 4. Coronary artery calcification and aortic atherosclerotic calcification. Critical Value/emergent results were called by telephone at the time of interpretation on 05/21/2020 at 6:55 pm to provider [facility] , who verbally acknowledged these results. Electronically Signed: By: Genevive Bi M.D. On: 05/21/2020 18:59   IR IVC FILTER PLMT / S&I Lenise Arena GUID/MOD SED  Result Date: 05/24/2020 INDICATION: 69 year old with recent cardiac arrest likely secondary to pulmonary embolism and COVID-19. Subsequent right ventricular failure. Patient has clot in the left pulmonary arteries and clot in left femoral vein. There is concern that the patient would not tolerate additional clot burden in the pulmonary arteries and request for IVC filter placement. EXAM: IVC FILTER PLACEMENT; IVC VENOGRAM; ULTRASOUND FOR VASCULAR ACCESS Physician: Rachelle Hora. Lowella Dandy, MD MEDICATIONS: None. ANESTHESIA/SEDATION: Versed 1 mg The patient was continuously monitored during the procedure by the interventional radiology nurse under my direct supervision. CONTRAST:  Carbon dioxide FLUOROSCOPY TIME:  Fluoroscopy Time: 3 minutes, 18 seconds, 18 mGy COMPLICATIONS: None immediate. PROCEDURE: Informed consent was obtained for an IVC filter placement from the patient's daughter. Ultrasound demonstrated a patent right internal jugular vein. Ultrasound images were obtained for documentation. The right neck was prepped and draped in a sterile fashion. Maximal barrier sterile technique was utilized including caps, mask, sterile gowns, sterile gloves, sterile drape, hand hygiene and skin antiseptic. The skin was anesthetized with 1% lidocaine. A 21 gauge needle was directed into the vein with ultrasound guidance and a micropuncture dilator set was  placed. A wire was advanced into the IVC. The filter sheath was advanced over the wire into the IVC. An IVC venogram was performed with carbon dioxide. Bilateral renal veins were cannulated using a 5 French catheter and Bentson wire. Bilateral renal veins are identified. Fluoroscopic images were obtained for documentation. A Bard Denali filter was deployed below the lowest renal vein. Post placement images of the filter were obtained. The vascular sheath was removed with manual compression. Bandage placed over the puncture site. FINDINGS: IVC was patent. Bilateral renal veins were identified. The filter was deployed below the lowest renal vein. IMPRESSION: Successful placement of a retrievable IVC filter. PLAN: This IVC filter is potentially retrievable. The patient will be assessed for filter retrieval by Interventional Radiology in approximately 8-12 weeks. Further recommendations regarding filter retrieval, continued surveillance or declaration of device permanence,  will be made at that time. Electronically Signed   By: Richarda Overlie M.D.   On: 05/24/2020 17:57   DG CHEST PORT 1 VIEW  Result Date: 05/26/2020 CLINICAL DATA:  Acute respiratory failure.  Hypoxia. EXAM: PORTABLE CHEST 1 VIEW COMPARISON:  05/22/2020. FINDINGS: Interim extubation and removal of NG tube. Left IJ line in stable position. Heart size normal. Diffuse left lung infiltrate. Right base infiltrate. No prominent pleural effusion. No pneumothorax. IMPRESSION: 1. Interim extubation and removal of NG tube. Left IJ line stable position. 2. Diffuse left lung infiltrate and right base infiltrate. Electronically Signed   By: Maisie Fus  Register   On: 05/26/2020 05:50   DG CHEST PORT 1 VIEW  Result Date: 05/22/2020 CLINICAL DATA:  Central line placement, intubation EXAM: PORTABLE CHEST 1 VIEW COMPARISON:  05/21/2020 FINDINGS: Left central line has been placed with the tip in the SVC. No pneumothorax. Endotracheal tube is 5 cm above the carina.  Patchy airspace disease throughout the left lung. No focal opacity on the right. Heart is normal size. IMPRESSION: Endotracheal tube 5 cm above the carina. Left central line tip in the SVC. No pneumothorax. Stable patchy left lung airspace disease. Electronically Signed   By: Charlett Nose M.D.   On: 05/22/2020 21:06   DG Chest Port 1 View  Result Date: 05/21/2020 CLINICAL DATA:  Cough, lower back pain, has not eaten 5 days, emesis EXAM: PORTABLE CHEST 1 VIEW COMPARISON:  Radiograph 04/19/2010, CT 04/18/2010 FINDINGS: Heterogeneous opacities present predominantly along the periphery of the left lung and minimally in the right lung base as well. No pneumothorax or effusion. The aorta is calcified. The remaining cardiomediastinal contours are unremarkable. No acute osseous or soft tissue abnormality. Degenerative changes are present in the imaged spine and shoulders. Telemetry leads overlie the chest. IMPRESSION: Heterogeneous opacities throughout the lungs, greatest in the left lung periphery concerning for pneumonia including potential viral etiology. Aortic Atherosclerosis (ICD10-I70.0). Electronically Signed   By: Kreg Shropshire M.D.   On: 05/21/2020 03:50   DG Abd Portable 1V  Result Date: 05/23/2020 CLINICAL DATA:  Encounter for orogastric tube placement. EXAM: PORTABLE ABDOMEN - 1 VIEW COMPARISON:  06/09/2009 and chest radiograph 05/22/2020 FINDINGS: Gastric tube extends into the abdomen and the tip is in the midline of the lower abdomen, likely in the distal stomach body region. Again noted are patchy densities at the left lung base. Few gas-filled loops of bowel in the abdomen. IMPRESSION: Orogastric tube in the lower abdominal midline and likely in the distal stomach body region. Electronically Signed   By: Richarda Overlie M.D.   On: 05/23/2020 12:17   ECHOCARDIOGRAM COMPLETE  Result Date: 05/23/2020    ECHOCARDIOGRAM REPORT   Patient Name:   BALIAN SCHALLER Date of Exam: 05/23/2020 Medical Rec #:   [id number]      Height:       72.0 in Accession #:    [id number]     Weight:       142.4 lb Date of Birth:  August 18, 1950      BSA:          1.844 m Patient Age:    69 years       BP:           112/85 mmHg Patient Gender: M              HR:           96 bpm. Exam Location:  Inpatient Procedure: 2D Echo, Cardiac Doppler  and Color Doppler STAT ECHO Indications:    I26.02 Pulmonary embolus  History:        Patient has no prior history of Echocardiogram examinations.                 Arrythmias:Cardiac Arrest; Signs/Symptoms:Dyspnea, Hypotension                 and Altered Mental Status. Covid 19 positive.  Sonographer:    Sheralyn Boatman RDCS Referring Phys: [id number] Martina Sinner  Sonographer Comments: Technically difficult study due to poor echo windows and echo performed with patient supine and on artificial respirator. IMPRESSIONS  1. Severe RV failure and cor pulmonale.  2. Abnormal septal motion and septal flattening consistent with RV pressure overload. Left ventricular ejection fraction, by estimation, is 50 to 55%. The left ventricle has low normal function. The left ventricle has no regional wall motion abnormalities. Left ventricular diastolic parameters were normal.  3. Right ventricular systolic function is severely reduced. The right ventricular size is severely enlarged. There is mildly elevated pulmonary artery systolic pressure.  4. The mitral valve is normal in structure. No evidence of mitral valve regurgitation. No evidence of mitral stenosis.  5. The aortic valve is normal in structure. Aortic valve regurgitation is not visualized. No aortic stenosis is present.  6. The inferior vena cava is dilated in size with <50% respiratory variability, suggesting right atrial pressure of 15 mmHg. FINDINGS  Left Ventricle: Abnormal septal motion and septal flattening consistent with RV pressure overload. Left ventricular ejection fraction, by estimation, is 50 to 55%. The left ventricle has low normal function. The  left ventricle has no regional wall motion abnormalities. The left ventricular internal cavity size was normal in size. There is no left ventricular hypertrophy. Left ventricular diastolic parameters were normal. Right Ventricle: The right ventricular size is severely enlarged. Right vetricular wall thickness was not assessed. Right ventricular systolic function is severely reduced. There is mildly elevated pulmonary artery systolic pressure. The tricuspid regurgitant velocity is 2.67 m/s, and with an assumed right atrial pressure of 8 mmHg, the estimated right ventricular systolic pressure is 36.5 mmHg. Left Atrium: Left atrial size was normal in size. Right Atrium: Right atrial size was normal in size. Pericardium: There is no evidence of pericardial effusion. Mitral Valve: The mitral valve is normal in structure. No evidence of mitral valve regurgitation. No evidence of mitral valve stenosis. Tricuspid Valve: The tricuspid valve is normal in structure. Tricuspid valve regurgitation is mild . No evidence of tricuspid stenosis. Aortic Valve: The aortic valve is normal in structure. Aortic valve regurgitation is not visualized. No aortic stenosis is present. Pulmonic Valve: The pulmonic valve was normal in structure. Pulmonic valve regurgitation is not visualized. No evidence of pulmonic stenosis. Aorta: The aortic root is normal in size and structure. Venous: The inferior vena cava is dilated in size with less than 50% respiratory variability, suggesting right atrial pressure of 15 mmHg. IAS/Shunts: No atrial level shunt detected by color flow Doppler. Additional Comments: Severe RV failure and cor pulmonale.  LEFT VENTRICLE PLAX 2D LVIDd:         3.70 cm     Diastology LVIDs:         2.80 cm     LV e' medial:    4.46 cm/s LV PW:         1.10 cm     LV E/e' medial:  4.9 LV IVS:        1.10 cm  LV e' lateral:   6.97 cm/s LVOT diam:     2.10 cm     LV E/e' lateral: 3.1 LV SV:         28 LV SV Index:   15 LVOT  Area:     3.46 cm  LV Volumes (MOD) LV vol d, MOD A2C: 22.6 ml LV vol d, MOD A4C: 25.5 ml LV vol s, MOD A2C: 11.1 ml LV vol s, MOD A4C: 11.7 ml LV SV MOD A2C:     11.5 ml LV SV MOD A4C:     25.5 ml LV SV MOD BP:      13.7 ml RIGHT VENTRICLE            IVC RV S prime:     8.84 cm/s  IVC diam: 2.50 cm TAPSE (M-mode): 1.3 cm LEFT ATRIUM           Index      RIGHT ATRIUM           Index LA diam:      2.80 cm 1.52 cm/m RA Area:     14.30 cm LA Vol (A4C): 10.5 ml 5.69 ml/m RA Volume:   42.70 ml  23.15 ml/m  AORTIC VALVE LVOT Vmax:   53.80 cm/s LVOT Vmean:  41.900 cm/s LVOT VTI:    0.080 m  AORTA Ao Root diam: 3.60 cm Ao Asc diam:  3.50 cm MITRAL VALVE               TRICUSPID VALVE MV Area (PHT): 3.27 cm    TR Peak grad:   28.5 mmHg MV Decel Time: 232 msec    TR Vmax:        267.00 cm/s MV E velocity: 21.94 cm/s MV A velocity: 41.30 cm/s  SHUNTS MV E/A ratio:  0.53        Systemic VTI:  0.08 m                            Systemic Diam: 2.10 cm Charlton Haws MD Electronically signed by Charlton Haws MD Signature Date/Time: 05/23/2020/10:46:38 AM    Final    CT RENAL STONE STUDY  Result Date: 05/21/2020 CLINICAL DATA:  69 year old male with low back pain, flank pain, decreased p.o. EXAM: CT ABDOMEN AND PELVIS WITHOUT CONTRAST TECHNIQUE: Multidetector CT imaging of the abdomen and pelvis was performed following the standard protocol without IV contrast. COMPARISON:  Noncontrast CT Abdomen and Pelvis 10/28/2008. Portable chest radiograph 04/19/2010. FINDINGS: Lower chest: Patchy, irregular peribronchial and peripheral scattered pulmonary opacity at both lung bases. This seems to be new from last month. Underlying probable pulmonary hyperinflation. Some associated lung base bronchiectasis. No cavitating areas identified. No cardiomegaly, pericardial effusion or pleural effusion. Hepatobiliary: Negative noncontrast liver and gallbladder. Pancreas: Negative. Spleen: Negative. Adrenals/Urinary Tract: Bulky left  nephrolithiasis measuring 13 mm at the renal pelvis. Additional left lower pole stone. No hydronephrosis. Negative noncontrast right kidney. No hydroureter. Unremarkable urinary bladder. Incidental pelvic phleboliths. Stomach/Bowel: Decompressed large bowel. There is long segment circumferential wall thickening in the right colon, up to the 12 mm series 4, image 49) with some areas of adjacent mesenteric stranding (coronal image 43). The wall thickening gradually decreases through the transverse colon. No free air. No free fluid. The terminal ileum appear spared. Appendix seems to remain normal on coronal image 32. No dilated small bowel.  Decompressed stomach and duodenum. Vascular/Lymphatic: Normal caliber abdominal aorta. Mild calcified atherosclerosis. Vascular  patency is not evaluated in the absence of IV contrast. No lymphadenopathy identified in the absence of contrast. Reproductive: Negative. Other: Previous lower abdominal ventral hernia repair with mesh. No pelvic free fluid. Musculoskeletal: Degenerative changes in the spine. No acute osseous abnormality identified. IMPRESSION: 1. In the abdomen there is circumferential bowel wall thickening and mesenteric stranding involving the right colon. Wall thickening gradually abates through the transverse colon. This is compatible with Acute Nonspecific Colitis. 2. But the lung bases are also abnormal with patchy and irregular peribronchial and peripheral pulmonary opacity suspicious for acute viral/atypical respiratory infection. No areas of cavitation to suggest septic emboli. No pleural effusion. 3. Bulky left nephrolithiasis, but no obstructive uropathy. 4. Previous lower abdominal hernia repair with mesh. Electronically Signed   By: Odessa Fleming M.D.   On: 05/21/2020 02:46   VAS [place] LOWER EXTREMITY VENOUS (DVT)  Result Date: 05/24/2020  Lower Venous DVT Study Indications: Covid-19, pulmonary embolism.  Risk Factors: Confirmed PE. Anticoagulation: Heparin.  Limitations: Body habitus, ventilation, did not perform compressions in thigh and popliteal fossa of the left lower extremity secondary to mobile thrombus, bandages and line. Comparison Study: No prior study Performing Technologist: Sherren Kerns RVS  Examination Guidelines: A complete evaluation includes B-mode imaging, spectral Doppler, color Doppler, and power Doppler as needed of all accessible portions of each vessel. Bilateral testing is considered an integral part of a complete examination. Limited examinations for reoccurring indications may be performed as noted. The reflux portion of the exam is performed with the patient in reverse Trendelenburg.  +---------+---------------+---------+-----------+----------+--------------+ RIGHT    CompressibilityPhasicitySpontaneityPropertiesThrombus Aging +---------+---------------+---------+-----------+----------+--------------+ CFV      Full           Yes      No                                  +---------+---------------+---------+-----------+----------+--------------+ SFJ      Full                                                        +---------+---------------+---------+-----------+----------+--------------+ FV Prox  Full                                                        +---------+---------------+---------+-----------+----------+--------------+ FV Mid   Full                                                        +---------+---------------+---------+-----------+----------+--------------+ FV DistalFull                                                        +---------+---------------+---------+-----------+----------+--------------+ PFV      Full                                                        +---------+---------------+---------+-----------+----------+--------------+  POP      Full           Yes      No                                   +---------+---------------+---------+-----------+----------+--------------+ PTV      Full                                                        +---------+---------------+---------+-----------+----------+--------------+ PERO     Full                                                        +---------+---------------+---------+-----------+----------+--------------+   +---------+---------------+---------+-----------+----------+-------------------+ LEFT     CompressibilityPhasicitySpontaneityPropertiesThrombus Aging      +---------+---------------+---------+-----------+----------+-------------------+ CFV                                                   not visualized                                                            secondary to                                                              bandage/line        +---------+---------------+---------+-----------+----------+-------------------+ SFJ                                                   not visualized                                                            secondary to                                                              bandage/line        +---------+---------------+---------+-----------+----------+-------------------+ FV Prox  Full  patent by color     +---------+---------------+---------+-----------+----------+-------------------+ FV Mid                                                patent by color     +---------+---------------+---------+-----------+----------+-------------------+ FV Distal                                             patent by color     +---------+---------------+---------+-----------+----------+-------------------+ PFV                                                   patent by color     +---------+---------------+---------+-----------+----------+-------------------+ POP                                                    patent by color     +---------+---------------+---------+-----------+----------+-------------------+ PTV      Full                                                             +---------+---------------+---------+-----------+----------+-------------------+ PERO     Full                                                             +---------+---------------+---------+-----------+----------+-------------------+     Summary: RIGHT: - There is no evidence of deep vein thrombosis in the lower extremity.  vessels are dilated with significantly sluggish flow, throughout  LEFT: - Findings consistent with acute deep vein thrombosis involving the left femoral vein. - Acute, mobile thrombus noted in the proximal and mid femoral vein. Vessels are dilated with significantly sluggish flow, throughout.  *See table(s) above for measurements and observations. Electronically signed by Waverly Ferrari MD on 05/24/2020 at 6:21:22 PM.    Final

## 2020-06-03 NOTE — Progress Notes (Signed)
CSW unable to leave vm on daughter's phone. CSW requested [facility] review referral as patient is 14 days past his COVID diagnosis.   Sydelle Sherfield LCSW

## 2020-06-04 LAB — COMPREHENSIVE METABOLIC PANEL
ALT: 57 U/L — ABNORMAL HIGH (ref 0–44)
AST: 57 U/L — ABNORMAL HIGH (ref 15–41)
Albumin: 1.4 g/dL — ABNORMAL LOW (ref 3.5–5.0)
Alkaline Phosphatase: 151 U/L — ABNORMAL HIGH (ref 38–126)
Anion gap: 7 (ref 5–15)
BUN: 11 mg/dL (ref 8–23)
CO2: 21 mmol/L — ABNORMAL LOW (ref 22–32)
Calcium: 7.5 mg/dL — ABNORMAL LOW (ref 8.9–10.3)
Chloride: 101 mmol/L (ref 98–111)
Creatinine, Ser: 1.04 mg/dL (ref 0.61–1.24)
GFR, Estimated: >60 mL/min (ref >60)
Glucose, Bld: 153 mg/dL — ABNORMAL HIGH (ref 70–99)
Potassium: 4.2 mmol/L (ref 3.5–5.1)
Sodium: 129 mmol/L — ABNORMAL LOW (ref 135–145)
Total Bilirubin: 0.9 mg/dL (ref 0.3–1.2)
Total Protein: 4.1 g/dL — ABNORMAL LOW (ref 6.5–8.1)

## 2020-06-04 LAB — CBC
HCT: 27.9 % — ABNORMAL LOW (ref 39.0–52.0)
Hemoglobin: 9.2 g/dL — ABNORMAL LOW (ref 13.0–17.0)
MCH: 28.5 pg (ref 26.0–34.0)
MCHC: 33 g/dL (ref 30.0–36.0)
MCV: 86.4 fL (ref 80.0–100.0)
Platelets: 305 10*3/uL (ref 150–400)
RBC: 3.23 MIL/uL — ABNORMAL LOW (ref 4.22–5.81)
RDW: 14.3 % (ref 11.5–15.5)
WBC: 14.5 10*3/uL — ABNORMAL HIGH (ref 4.0–10.5)
nRBC: 0 % (ref 0.0–0.2)

## 2020-06-04 LAB — GLUCOSE, CAPILLARY
Glucose-Capillary: 100 mg/dL — ABNORMAL HIGH (ref 70–99)
Glucose-Capillary: 106 mg/dL — ABNORMAL HIGH (ref 70–99)
Glucose-Capillary: 120 mg/dL — ABNORMAL HIGH (ref 70–99)
Glucose-Capillary: 86 mg/dL (ref 70–99)
Glucose-Capillary: 96 mg/dL (ref 70–99)
Glucose-Capillary: 97 mg/dL (ref 70–99)

## 2020-06-04 LAB — MAGNESIUM: Magnesium: 1.8 mg/dL (ref 1.7–2.4)

## 2020-06-04 MED ORDER — MAGNESIUM SULFATE 2 GM/50ML IV SOLN
2.0000 g | Freq: Once | INTRAVENOUS | Status: AC
  Administered 2020-06-04: 2 g via INTRAVENOUS
  Filled 2020-06-04: qty 50

## 2020-06-04 MED ORDER — FAMOTIDINE 20 MG PO TABS
20.0000 mg | ORAL_TABLET | Freq: Every day | ORAL | Status: DC
  Administered 2020-06-04 – 2020-06-25 (×22): 20 mg via ORAL
  Filled 2020-06-04 (×23): qty 1

## 2020-06-04 NOTE — Plan of Care (Signed)
  Problem: Education: Goal: Knowledge of risk factors and measures for prevention of condition will improve Outcome: Progressing   Problem: Coping: Goal: Psychosocial and spiritual needs will be supported Outcome: Progressing   Problem: Respiratory: Goal: Will maintain a patent airway Outcome: Progressing Goal: Complications related to the disease process, condition or treatment will be avoided or minimized Outcome: Progressing   

## 2020-06-04 NOTE — Progress Notes (Signed)
Physical Therapy Treatment Patient Details Name: Dwayne Barnes MRN: [id number] DOB: 1950-10-11 Today's Date: 06/04/2020    History of Present Illness Pt is a 69 y.o. male admitted 05/20/20 with back pain, weakness and colitis; found to have (+) COVID-19 and LLL PE. Pt with 2x cardiac arrests on 11/19 and intubated; self-exubated 11/22. 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 Head CT 11/23 unremarkable. S/p femoral line removal 11/24. Course complicated by progressive diarrhea; abdominal CT 11/29 with progressive colitis. PMH includes HOH.   PT Comments    Pt slowly progressing with mobility. Remains limited by bowel incontinence with activity, but able to tolerate multiple sit<>stands and standing transfers this session, requiring consistent modA for stability. Pt limited by generalized weakness, decreased activity tolerance and significantly impaired balance strategies/postural reactions. Continue to recommend SNF-level therapies to maximize functional mobility and independence prior to return home.    Follow Up Recommendations  SNF;Supervision for mobility/OOB     Equipment Recommendations  3in1 (PT);Rolling walker with 5" wheels    Recommendations for Other Services       Precautions / Restrictions Precautions Precautions: Fall Precaution Comments: Extremely HOH; bowel incontinence with mobility Restrictions Weight Bearing Restrictions: No    Mobility  Bed Mobility Overal bed mobility: Needs Assistance Bed Mobility: Supine to Sit     Supine to sit: Supervision;HOB elevated        Transfers Overall transfer level: Needs assistance Equipment used: 1 person hand held assist Transfers: Sit to/from [employer] Sit to Stand: Mod assist Stand pivot transfers: Mod assist       General transfer comment: Multiple sit<>stands from bed, BSC and recliner with consistent modA for stability; pt wanting to attempt as independently as possible, but  falling back into sitting on bed 2x before allowing PT to assist, requiring modA and UE support to take steps bed<>BSC<>recliner  Ambulation/Gait             General Gait Details: Pivotal steps - limited by bowel incontinence and fatigue after multiple standing trials and transfers to use BSC and cleanup from incontinence   Stairs             Wheelchair Mobility    Modified Rankin (Stroke Patients Only)       Balance Overall balance assessment: Needs assistance Sitting-balance support: Single extremity supported Sitting balance-Leahy Scale: Fair     Standing balance support: Single extremity supported Standing balance-Leahy Scale: Poor Standing balance comment: Reliant on external assist and at least single UE support to maintain balance; dependent for posterior pericare                            Cognition Arousal/Alertness: Awake/alert Behavior During Therapy: WFL for tasks assessed/performed Overall Cognitive Status: Difficult to assess                                 General Comments: Difficult to assess due to very HOH. Following majority of simple gesutural and verbal commands appropriately; appropriate conversation      Exercises      General Comments        Pertinent Vitals/Pain Pain Assessment: No/denies pain Pain Intervention(s): Monitored during session    Home Living                      Prior Function  PT Goals (current goals can now be found in the care plan section) Progress towards PT goals: Progressing toward goals    Frequency    Min 2X/week      PT Plan Current plan remains appropriate    Co-evaluation              AM-PAC PT "6 Clicks" Mobility   Outcome Measure  Help needed turning from your back to your side while in a flat bed without using bedrails?: A Little Help needed moving from lying on your back to sitting on the side of a flat bed without using bedrails?:  A Little Help needed moving to and from a bed to a chair (including a wheelchair)?: A Lot Help needed standing up from a chair using your arms (e.g., wheelchair or bedside chair)?: A Lot Help needed to walk in hospital room?: A Lot Help needed climbing 3-5 steps with a railing? : A Lot 6 Click Score: 14    End of Session   Activity Tolerance: Patient tolerated treatment well;Treatment limited secondary to medical complications (Comment) Patient left: in chair;with call bell/phone within reach;with nursing/sitter in room Nurse Communication: Mobility status PT Visit Diagnosis: Muscle weakness (generalized) (M62.81);Difficulty in walking, not elsewhere classified (R26.2)     Time: 4166-0630 PT Time Calculation (min) (ACUTE ONLY): 24 min  Charges:  $Therapeutic Activity: 23-37 mins                     Dwayne Barnes, PT, DPT Acute Rehabilitation Services  Pager [phone] Office [phone]  Dwayne Barnes 06/04/2020, 6:20 PM

## 2020-06-04 NOTE — Consult Note (Addendum)
   [facility] Inpatient Consult   06/04/2020  MACK ALVIDREZ 1950/07/15 [id number]  [place] [facility] [ACO] Patient: [facility] Medicare   Patient screened for lenght of stay [14 days]  hospitalizations to check for progress for disposition needs and [facility] Care Management needs.  Review of patient's medical record reveals patient is being recommended for a skilled nursing level of care.  Primary Care Provider is Nicholes Rough, PA-C this provider is listed with [facility] is not in the [employer].  Patient is currently for SNF needs.  Post hospital needs to be met at that level of care. Patient is listed as unattributed in [place].  Will sign off.  For questions contact:   Natividad Brood, RN BSN [facility] Liaison  [phone] business mobile phone Toll free office [phone]  Fax number: [phone] [email or web address]

## 2020-06-04 NOTE — TOC Initial Note (Signed)
Transition of Care [facility]) - Initial/Assessment Note    Patient Details  Name: Dwayne Barnes MRN: [id number] Date of Birth: [birth year]  [facility] of [facility]) CM/SW Contact:    Mearl Latin, LCSW Phone Number: 06/04/2020, 6:26 PM  Clinical Narrative:                 CSW received consult for possible SNF placement at time of discharge. CSW spoke with patient's daughter regarding PT recommendation of SNF placement at time of discharge. Patient's daughter reported that patient lives alone and she cannot care for patient at her home given patient's current physical needs and fall risk, though after rehab he will come stay with her. CSW explained that [facility] is the only facility able to accept him at this time. She reported understanding. CSW discussed insurance authorization process and provided Medicare SNF ratings list. [facility] is expecting a bed to open up soon and will begin [facility] authorization process.   Expected Discharge Plan: Skilled Nursing Facility Barriers to Discharge: Continued Medical Work up, English as a second language teacher, SNF Pending bed offer   Patient Goals and CMS Choice Patient states their goals for this hospitalization and ongoing recovery are:: Rehab CMS Medicare.gov Compare Post Acute Care list provided to:: Patient Represenative (must comment) Choice offered to / list presented to : Patient, Adult Children  Expected Discharge Plan and Services Expected Discharge Plan: Skilled Nursing Facility In-house Referral: Clinical Social Work Discharge Planning Services: CM Consult Post Acute Care Choice: Skilled Nursing Facility Living arrangements for the past 2 months: Single Family Home                                      Prior Living Arrangements/Services Living arrangements for the past 2 months: Single Family Home Lives with:: Self Patient language and need for interpreter reviewed:: Yes Do you feel safe going back to the  place where you live?: Yes      Need for Family Participation in Patient Care: Yes (Comment) Care giver support system in place?: Yes (comment)   Criminal Activity/Legal Involvement Pertinent to Current Situation/Hospitalization: No - Comment as needed  Activities of Daily Living Home Assistive Devices/Equipment: None ADL Screening (condition at time of admission) Patient's cognitive ability adequate to safely complete daily activities?: No Is the patient deaf or have difficulty hearing?: Yes Does the patient have difficulty seeing, even when wearing glasses/contacts?: No Does the patient have difficulty concentrating, remembering, or making decisions?: Yes Patient able to express need for assistance with ADLs?: Yes Does the patient have difficulty dressing or bathing?: Yes Independently performs ADLs?: No Communication: Independent Dressing (OT): Needs assistance Is this a change from baseline?: Pre-admission baseline Grooming: Independent with device (comment) Feeding: Independent Bathing: Needs assistance Is this a change from baseline?: Pre-admission baseline Toileting: Needs assistance Is this a change from baseline?: Pre-admission baseline In/Out Bed: Needs assistance Is this a change from baseline?: Pre-admission baseline Walks in Home: Independent Does the patient have difficulty walking or climbing stairs?: Yes Weakness of Legs: Right Weakness of Arms/Hands: Left  Permission Sought/Granted Permission sought to share information with : Facility Medical sales representative, Family Supports Permission granted to share information with : Yes, Verbal Permission Granted  Share Information with NAME: Paule  Permission granted to share info w AGENCY: SNFs  Permission granted to share info w Relationship: Daughter  Permission granted to share info w Contact Information: [phone]  Emotional Assessment     Affect (typically observed): Appropriate Orientation: : Oriented to  Self, Oriented to Place, Oriented to  Time, Oriented to Situation Alcohol / Substance Use: Not Applicable Psych Involvement: No (comment)  Admission diagnosis:  Colitis [K52.9] Generalized weakness [R53.1] Urinary tract infection without hematuria, site unspecified [N39.0] Pneumonia due to COVID-19 virus [U07.1, J12.82] Patient Active Problem List   Diagnosis Date Noted  . Generalized weakness 05/21/2020  . COVID-19 virus infection 05/21/2020  . Acute pulmonary embolism (HCC) 05/21/2020   PCP:  [name], PA-C Pharmacy:   [facility] [phone] - [place], [place] - [address] [place] [address] Phone: [phone] Fax: [phone]     Social Determinants of Health (SDOH) Interventions    Readmission Risk Interventions No flowsheet data found.

## 2020-06-04 NOTE — Progress Notes (Signed)
PROGRESS NOTE                                                                                                                                                                                                             Patient Demographics:    Dwayne Barnes, is a 69 y.o. male, DOB - September 26, 1950, WUJ:[id number]  Outpatient Primary MD for the patient is [name], PA-C   Admit date - 05/20/2020   LOS - 14  Chief Complaint  Patient presents with  . Fatigue       Brief Narrative: Patient is a 69 y.o. male with no significant past medical history-presented to the ED on 11/17 with nausea/vomiting, weakness and back pain-further evaluation revealed COVID-19 infection and a large PE.  Patient was started on IV heparin infusion along with steroid/Remdesivir-on 11/19-patient had a cardiac arrest-was intubated during the code-total downtime of around 6 minutes before ROSC.  Patient was subsequently transferred to the ICU-unfortunately soon after arrival in the ICU-he had another cardiac arrest-and 4 rounds of CPR were provided with ROSC.  Patient was provided supportive care-he subsequently improved-self extubated on 11/23-he was [facility] on 11/25.  Further hospital course complicated by diarrhea secondary to C. difficile along with AKI and hyponatremia.See below for further details.   COVID-19 vaccinated status: Vaccinated  Significant Events: 11/17>> Admit to [facility] for PE/COVID-19 infection 11/19>> PEA cardiac arrest x 2-given TPA-intubated-transfer to ICU 11/23>> Self extubated 11/25>> transfer to [facility] 11/29>> worsening diarrhea-CT abdomen with progressive colitis  Significant studies: 11/18>>CT renal stone: non specific colitis, GGO's, left nephrolithiasis without obstructive uropathy. 11/18>>CTA chest: large PC in LLL, GGO's LLL. 11/20 echo>> EF 50-55%, RV systolic function severely  reduced. 11/21>> B/L extremity Doppler: acute DVTin left femoral vein, acute mobile thrombus in the proximal femoral vein. 11/23>>CT Head: unremarkable   11/29>> CT abdomen/pelvis>> progressive colitis  COVID-19 medications: Steroids: 11/17>>11/26 Remdesivir: 11/17>> 11/22  Antibiotics: Oral vancomycin: 11/30>>  Microbiology data: 11/18>> urine culture: Insignificant growth 11/30>> stool C. difficile: +ve 11/30>> GI pathogen panel: negative  Procedures: ETT>>11/19 - 11/23 IVC filter placement by IR>>11/21.  Consults: PCCM, IR  DVT prophylaxis: Therapeutic Lovenox>> Eliquis    Subjective:   Abdominal pain better-diarrhea slowing down but still present.   Assessment  & Plan :   Cardiac arrest due to large PE: S/p  TPA-was on therapeutic Lovenox-we will transition to Eliquis today. PE likely provoked by COVID-19 infection.  PCCM MD discussed with Dr. Illa Level indication for Impella support.  Continue telemetry monitoring.  Echo as above.  Large PE with DVT: Continue therapeutic anticoagulation-he is s/p TPA on 11/19 when he had cardiac arrest.  Patient is s/p IVC filter given mobile thrombus seen on lower extremity Doppler.  Once oral intake was stabilized-he was transitioned to Eliquis.  Acute hypoxic respiratory failure: Due to PE/cardiac arrest/COVID-19 pneumonia-self extubated on 11/23-currently stable on room air.  Acute metabolic encephalopathy/ICU delirium/possible anoxic injury: Mental status continues to slowly improve-getting more fluent with responses.  CT head without acute changes.  Continue supportive care.  Minimize all sedating medications.    Dysphagia: In the setting of cardiac arrest-seen by SLP-required NG tube-subsequently seen by SLP-diet gradually upgraded to a dysphagia 3 diet.  C. difficile colitis: Diarrhea improving-leukocytosis/AKI all getting better.  Continue oral vancomycin.  AKI: Likely hemodynamically mediated-due to diarrhea-has resolved  with IV fluids and improvement in diarrhea.  Hyponatremia: Due to diarrhea/volume loss-better once diarrhea started improving and with IV fluids.  Hypokalemia: Repleted.  Hypomagnesemia: Replete and recheck.  COVID-19 pneumonia: Completed a course of Remdesivir-an steroids.  Hypoxia has resolved.   Paroxysmal atrial fibrillation: appears to be back in sinus rhythm-on anticoagulation for PE.  Echo as above.    Normocytic anemia: Secondary to critical illness-stable for monitoring-no indication for PRBC transfusion.  Transaminitis: Likely secondary to COVID-19-downtrending.  Deconditioning/debility: Secondary to critical illness- PT/OT eval completed-recommendations are for SNF  Hard of hearing  GI prophylaxis: H2 blocker.  ABG:    Component Value Date/Time   PHART 7.417 05/25/2020 0506   PCO2ART 45.4 05/25/2020 0506   PO2ART 164 (H) 05/25/2020 0506   HCO3 29.2 (H) 05/25/2020 0506   TCO2 31 05/25/2020 0506   ACIDBASEDEF 2.0 05/22/2020 1948   O2SAT 99.0 05/25/2020 0506    Vent Settings: N/A   Condition - Extremely Guarded  Family Communication  :  Daughter Neldon Shepard [phone])  updated over the phone 12/2  Code Status :  Full Code  Diet :  Diet Order            DIET DYS 3 Room service appropriate? Yes; Fluid consistency: Thin  Diet effective now                  Disposition Plan  :   Status is: Inpatient  Remains inpatient appropriate because:Inpatient level of care appropriate due to severity of illness   Dispo: The patient is from: Home              Anticipated d/c is to: SNF              Anticipated d/c date is: > 1-2 days              Patient currently is not medically stable to d/c.   Barriers to discharge: C Diff colitis-AKI-Hyponatremia/Hypokalemia  Antimicorbials  :    Anti-infectives (From admission, onward)   Start     Dose/Rate Route Frequency Ordered Stop   06/02/20 1000  vancomycin (VANCOCIN) 50 mg/mL oral solution 125 mg         125 mg Oral 4 times daily 06/02/20 0800 06/12/20 0959   05/22/20 1000  remdesivir 100 mg in sodium chloride 0.9 % 100 mL IVPB       "Followed by" Linked Group Details   100 mg 200 mL/hr over 30 Minutes Intravenous Daily 05/21/20  7035 05/25/20 1154   05/21/20 1030  remdesivir 200 mg in sodium chloride 0.9% 250 mL IVPB  Status:  Discontinued       "Followed by" Linked Group Details   200 mg 580 mL/hr over 30 Minutes Intravenous Once 05/21/20 0857 05/27/20 0734   05/21/20 0330  metroNIDAZOLE (FLAGYL) tablet 500 mg        500 mg Oral  Once 05/21/20 0320 05/21/20 0411   05/20/20 2330  cefTRIAXone (ROCEPHIN) 2 g in sodium chloride 0.9 % 100 mL IVPB        2 g 200 mL/hr over 30 Minutes Intravenous  Once 05/20/20 2324 05/21/20 0331      Inpatient Medications  Scheduled Meds: . apixaban  10 mg Oral BID   Followed by  . [START ON 06/07/2020] apixaban  5 mg Oral BID  . Chlorhexidine Gluconate Cloth  6 each Topical Daily  . feeding supplement  237 mL Oral TID BM  . insulin aspart  0-9 Units Subcutaneous Q4H  . pantoprazole  40 mg Oral Daily  . sodium chloride flush  10-40 mL Intracatheter Q12H  . vancomycin  125 mg Oral QID   Continuous Infusions: . lactated ringers with kcl 75 mL/hr at 06/04/20 1200   PRN Meds:.acetaminophen (TYLENOL) oral liquid 160 mg/5 mL, [DISCONTINUED] ondansetron **OR** ondansetron (ZOFRAN) IV, sodium chloride flush   Time Spent in minutes  25   See all Orders from today for further details   Jeoffrey Massed M.D on 06/04/2020 at 3:00 PM  To page go to [email or web address] - use universal password  [facility] -  Office  [phone]    Objective:   Vitals:   06/04/20 0451 06/04/20 0742 06/04/20 0750 06/04/20 1159  BP:  105/60 105/61 (!) 110/50  Pulse:  74 73 72  Resp:  17 17 18   Temp:  98.5 F (36.9 C) 98.2 F (36.8 C) 98.4 F (36.9 C)  TempSrc:  Oral Oral Oral  SpO2:  100% 100% 96%  Weight: 62.8 kg     Height:        Wt Readings from Last 3  Encounters:  06/04/20 62.8 kg  12/20/14 66 kg     Intake/Output Summary (Last 24 hours) at 06/04/2020 1500 Last data filed at 06/04/2020 1200 Gross per 24 hour  Intake 1568.88 ml  Output 1950 ml  Net -381.12 ml     Physical Exam Gen Exam:Alert awake-not in any distress HEENT:atraumatic, normocephalic Chest: B/L clear to auscultation anteriorly CVS:S1S2 regular Abdomen:soft non tender, non distended Extremities:no edema Neurology: Non focal Skin: no rash   Data Review:    CBC Recent Labs  Lab 05/31/20 0247 06/01/20 0319 06/02/20 0134 06/03/20 0117 06/04/20 0010  WBC 24.1* 24.7* 19.5* 17.0* 14.5*  HGB 11.2* 10.3* 9.2* 9.3* 9.2*  HCT 34.2* 30.9* 27.1* 26.7* 27.9*  PLT 246 236 213 250 305  MCV 88.1 87.0 85.0 84.0 86.4  MCH 28.9 29.0 28.8 29.2 28.5  MCHC 32.7 33.3 33.9 34.8 33.0  RDW 15.2 14.8 14.1 14.0 14.3    Chemistries  Recent Labs  Lab 05/29/20 0341 05/29/20 0341 05/30/20 0208 05/31/20 0247 06/02/20 0134 06/02/20 1203 06/02/20 1837 06/03/20 0117 06/04/20 0010  NA 144   < > 142   < > 123* 124* 126* 128* 129*  K 3.6   < > 3.8   < > 2.9* 3.5 3.2* 3.9 4.2  CL 109   < > 109   < > 92* 94* 95* 99 101  CO2 27   < >  25   < > 21* 22 23 21* 21*  GLUCOSE 124*   < > 105*   < > 152* 116* 119* 105* 153*  BUN 30*   < > 24*   < > 21 15 13 12 11   CREATININE 0.89   < > 0.93   < > 1.41* 1.11 1.02 0.99 1.04  CALCIUM 8.7*   < > 8.4*   < > 7.3* 7.4* 7.6* 7.5* 7.5*  MG 1.9  --   --   --   --   --   --  1.7 1.8  AST 49*  --  59*  --  43*  --   --  46* 57*  ALT 52*  --  73*  --  53*  --   --  57* 57*  ALKPHOS 119  --  139*  --  131*  --   --  129* 151*  BILITOT 0.9  --  1.0  --  1.3*  --   --  1.2 0.9   < > = values in this interval not displayed.   ------------------------------------------------------------------------------------------------------------------ No results for input(s): CHOL, HDL, LDLCALC, TRIG, CHOLHDL, LDLDIRECT in the last 72 hours.  Lab Results   Component Value Date   HGBA1C 5.5 05/22/2020   ------------------------------------------------------------------------------------------------------------------ No results for input(s): TSH, T4TOTAL, T3FREE, THYROIDAB in the last 72 hours.  Invalid input(s): FREET3 ------------------------------------------------------------------------------------------------------------------ No results for input(s): VITAMINB12, FOLATE, FERRITIN, TIBC, IRON, RETICCTPCT in the last 72 hours.  Coagulation profile No results for input(s): INR, PROTIME in the last 168 hours.  No results for input(s): DDIMER in the last 72 hours.  Cardiac Enzymes No results for input(s): CKMB, TROPONINI, MYOGLOBIN in the last 168 hours.  Invalid input(s): CK ------------------------------------------------------------------------------------------------------------------    Component Value Date/Time   BNP 499.2 (H) 05/23/2020 1244    Micro Results Recent Results (from the past 240 hour(s))  C Difficile Quick Screen (NO PCR Reflex)     Status: Abnormal   Collection Time: 06/02/20  8:41 AM   Specimen: STOOL  Result Value Ref Range Status   C Diff antigen POSITIVE (A) NEGATIVE Final   C Diff toxin POSITIVE (A) NEGATIVE Final   C Diff interpretation Toxin producing C. difficile detected.  Final    Comment: CRITICAL RESULT CALLED TO, READ BACK BY AND VERIFIED WITH: Tenna Child RN 12:05 06/02/20 ([email or web address]) Performed at [facility], [address] [place], [place] [address]   Gastrointestinal Panel by PCR , Stool     Status: None   Collection Time: 06/02/20  9:41 AM   Specimen: STOOL  Result Value Ref Range Status   Campylobacter species NOT DETECTED NOT DETECTED Final   Plesimonas shigelloides NOT DETECTED NOT DETECTED Final   Salmonella species NOT DETECTED NOT DETECTED Final   Yersinia enterocolitica NOT DETECTED NOT DETECTED Final   Vibrio species NOT DETECTED NOT DETECTED Final   Vibrio cholerae NOT  DETECTED NOT DETECTED Final   Enteroaggregative E coli (EAEC) NOT DETECTED NOT DETECTED Final   Enteropathogenic E coli (EPEC) NOT DETECTED NOT DETECTED Final   Enterotoxigenic E coli (ETEC) NOT DETECTED NOT DETECTED Final   Shiga like toxin producing E coli (STEC) NOT DETECTED NOT DETECTED Final   Shigella/Enteroinvasive E coli (EIEC) NOT DETECTED NOT DETECTED Final   Cryptosporidium NOT DETECTED NOT DETECTED Final   Cyclospora cayetanensis NOT DETECTED NOT DETECTED Final   Entamoeba histolytica NOT DETECTED NOT DETECTED Final   Giardia lamblia NOT DETECTED NOT DETECTED Final  Adenovirus F40/41 NOT DETECTED NOT DETECTED Final   Astrovirus NOT DETECTED NOT DETECTED Final   Norovirus GI/GII NOT DETECTED NOT DETECTED Final   Rotavirus A NOT DETECTED NOT DETECTED Final   Sapovirus (I, II, IV, and V) NOT DETECTED NOT DETECTED Final    Comment: Performed at [facility], [address], [place], [place] [address]    Radiology Reports CT ABDOMEN PELVIS WO CONTRAST  Result Date: 06/01/2020 CLINICAL DATA:  Left upper quadrant diarrhea, history of COVID-19 pneumonia. EXAM: CT ABDOMEN AND PELVIS WITHOUT CONTRAST TECHNIQUE: Multidetector CT imaging of the abdomen and pelvis was performed following the standard protocol without IV contrast. COMPARISON:  05/21/2020 FINDINGS: Lower chest: Bibasilar consolidation is noted increased when compared with the prior exam. These changes are worse on the left than the right. Stable changes of prior COVID-19 pneumonia are noted. No sizable effusion is seen. Hepatobiliary: No focal liver abnormality is seen. No gallstones, gallbladder wall thickening, or biliary dilatation. Pancreas: Unremarkable. No pancreatic ductal dilatation or surrounding inflammatory changes. Spleen: Normal in size without focal abnormality. Adrenals/Urinary Tract: Adrenal glands are within normal limits. Kidneys demonstrate left renal pelvis stone stable in appearance. No obstructive  changes are seen. The bladder is well distended. Stomach/Bowel: Diffuse edematous changes of the colonic wall are seen throughout its course increased when compared with the prior exam consistent with a generalized colitis. Some pericolonic inflammatory changes noted particularly on the right increased from the prior exam. The appendix appears within normal limits. Small bowel and stomach are unremarkable. Vascular/Lymphatic: Atherosclerotic calcifications of the aorta are noted. IVC filter is noted in place. No sizable lymphadenopathy is noted. Reproductive: Prostate is unremarkable. Other: No abdominal wall hernia or abnormality. No abdominopelvic ascites. Changes of prior hernia repair are noted in the lower abdomen bilaterally Musculoskeletal: Degenerative changes of lumbar spine are noted. IMPRESSION: Increasing colonic wall thickness with pericolonic inflammatory change consistent with progressive colitis. Increasing bilateral lower lobe consolidation without sizable effusion. Some scattered changes consistent with the COVID-19 pneumonia are noted. Stable nonobstructing left renal stone. Electronically Signed   By: Alcide Clever M.D.   On: 06/01/2020 23:59   CT HEAD WO CONTRAST  Result Date: 05/26/2020 CLINICAL DATA:  69 year old male with altered mental status. EXAM: CT HEAD WITHOUT CONTRAST TECHNIQUE: Contiguous axial images were obtained from the base of the skull through the vertex without intravenous contrast. COMPARISON:  Head CT dated 04/18/2010. FINDINGS: Brain: The ventricles and sulci appropriate size for patient's age. The gray-white matter discrimination is preserved. There is no acute intracranial hemorrhage. No mass effect midline shift. No extra-axial fluid collection. Vascular: No hyperdense vessel or unexpected calcification. Skull: Normal. Negative for fracture or focal lesion. Sinuses/Orbits: No acute finding. Other: None IMPRESSION: Unremarkable noncontrast CT of the brain.  Electronically Signed   By: Elgie Collard M.D.   On: 05/26/2020 15:45   CT ANGIO CHEST PE W OR WO CONTRAST  Addendum Date: 05/21/2020   ADDENDUM REPORT: 05/21/2020 19:28 ADDENDUM: Critical Value/emergent results were called by telephone at the time of interpretation on 05/21/2020 at 7:27 pm to provider Dr. Dierdre Highman, who verbally acknowledged these results. Of note the airspace disease in the LEFT lower lobe is progressed from CT same day. Differential remains the same. Electronically Signed   By: Genevive Bi M.D.   On: 05/21/2020 19:28   Result Date: 05/21/2020 CLINICAL DATA:  PE suspected, high probability.  COVID-19 infection EXAM: CT ANGIOGRAPHY CHEST WITH CONTRAST TECHNIQUE: Multidetector CT imaging of the chest was performed  using the standard protocol during bolus administration of intravenous contrast. Multiplanar CT image reconstructions and MIPs were obtained to evaluate the vascular anatomy. CONTRAST:  75mL OMNIPAQUE IOHEXOL 300 MG/ML  SOLN COMPARISON:  None. FINDINGS: Cardiovascular: Large filling defect within the proximal LEFT lower lobe pulmonary artery. Filling defect spans the bifurcation of the LEFT lower lobe and lingular branch pulmonary branches. This this filling defect/thrombus is occlusive to the LEFT lower lobe. No LEFT upper lobe filling defects identified. No filling defect identified within the RIGHT lung pulmonary arteries. No evidence of RIGHT ventricular strain with the RV to LV ratio less than 1. Coronary artery calcification and aortic atherosclerotic calcification. Mediastinum/Nodes: No axillary or supraclavicular adenopathy. No mediastinal or hilar adenopathy. No pericardial fluid. Esophagus normal. Lungs/Pleura: There is diffuse very fine airspace disease within the LEFT lower lobe involving the majority of the LEFT lower lobe. Similar milder findings in the posterior RIGHT lower lobe and lingula. Upper Abdomen: Limited view of the liver, kidneys, pancreas are  unremarkable. Normal adrenal glands. Musculoskeletal: No aggressive osseous lesion. Review of the MIP images confirms the above findings. IMPRESSION: 1. Large occlusive pulmonary embolism within the proximal LEFT lower lobe pulmonary artery and lingular pulmonary artery. 2. Diffuse fine airspace disease within the LEFT lower lobe with differential including pulmonary infarction, pulmonary hemorrhage, or pneumonia (including COVID pneumonia). 3. No evidence of RIGHT ventricular strain. 4. Coronary artery calcification and aortic atherosclerotic calcification. Critical Value/emergent results were called by telephone at the time of interpretation on 05/21/2020 at 6:55 pm to provider [facility] , who verbally acknowledged these results. Electronically Signed: By: Genevive Bi M.D. On: 05/21/2020 18:59   IR IVC FILTER PLMT / S&I Lenise Arena GUID/MOD SED  Result Date: 05/24/2020 INDICATION: 69 year old with recent cardiac arrest likely secondary to pulmonary embolism and COVID-19. Subsequent right ventricular failure. Patient has clot in the left pulmonary arteries and clot in left femoral vein. There is concern that the patient would not tolerate additional clot burden in the pulmonary arteries and request for IVC filter placement. EXAM: IVC FILTER PLACEMENT; IVC VENOGRAM; ULTRASOUND FOR VASCULAR ACCESS Physician: Rachelle Hora. Lowella Dandy, MD MEDICATIONS: None. ANESTHESIA/SEDATION: Versed 1 mg The patient was continuously monitored during the procedure by the interventional radiology nurse under my direct supervision. CONTRAST:  Carbon dioxide FLUOROSCOPY TIME:  Fluoroscopy Time: 3 minutes, 18 seconds, 18 mGy COMPLICATIONS: None immediate. PROCEDURE: Informed consent was obtained for an IVC filter placement from the patient's daughter. Ultrasound demonstrated a patent right internal jugular vein. Ultrasound images were obtained for documentation. The right neck was prepped and draped in a sterile fashion. Maximal barrier  sterile technique was utilized including caps, mask, sterile gowns, sterile gloves, sterile drape, hand hygiene and skin antiseptic. The skin was anesthetized with 1% lidocaine. A 21 gauge needle was directed into the vein with ultrasound guidance and a micropuncture dilator set was placed. A wire was advanced into the IVC. The filter sheath was advanced over the wire into the IVC. An IVC venogram was performed with carbon dioxide. Bilateral renal veins were cannulated using a 5 French catheter and Bentson wire. Bilateral renal veins are identified. Fluoroscopic images were obtained for documentation. A Bard Denali filter was deployed below the lowest renal vein. Post placement images of the filter were obtained. The vascular sheath was removed with manual compression. Bandage placed over the puncture site. FINDINGS: IVC was patent. Bilateral renal veins were identified. The filter was deployed below the lowest renal vein. IMPRESSION: Successful placement of a retrievable IVC  filter. PLAN: This IVC filter is potentially retrievable. The patient will be assessed for filter retrieval by Interventional Radiology in approximately 8-12 weeks. Further recommendations regarding filter retrieval, continued surveillance or declaration of device permanence, will be made at that time. Electronically Signed   By: Richarda Overlie M.D.   On: 05/24/2020 17:57   DG CHEST PORT 1 VIEW  Result Date: 05/26/2020 CLINICAL DATA:  Acute respiratory failure.  Hypoxia. EXAM: PORTABLE CHEST 1 VIEW COMPARISON:  05/22/2020. FINDINGS: Interim extubation and removal of NG tube. Left IJ line in stable position. Heart size normal. Diffuse left lung infiltrate. Right base infiltrate. No prominent pleural effusion. No pneumothorax. IMPRESSION: 1. Interim extubation and removal of NG tube. Left IJ line stable position. 2. Diffuse left lung infiltrate and right base infiltrate. Electronically Signed   By: Maisie Fus  Register   On: 05/26/2020 05:50   DG  CHEST PORT 1 VIEW  Result Date: 05/22/2020 CLINICAL DATA:  Central line placement, intubation EXAM: PORTABLE CHEST 1 VIEW COMPARISON:  05/21/2020 FINDINGS: Left central line has been placed with the tip in the SVC. No pneumothorax. Endotracheal tube is 5 cm above the carina. Patchy airspace disease throughout the left lung. No focal opacity on the right. Heart is normal size. IMPRESSION: Endotracheal tube 5 cm above the carina. Left central line tip in the SVC. No pneumothorax. Stable patchy left lung airspace disease. Electronically Signed   By: Charlett Nose M.D.   On: 05/22/2020 21:06   DG Chest Port 1 View  Result Date: 05/21/2020 CLINICAL DATA:  Cough, lower back pain, has not eaten 5 days, emesis EXAM: PORTABLE CHEST 1 VIEW COMPARISON:  Radiograph 04/19/2010, CT 04/18/2010 FINDINGS: Heterogeneous opacities present predominantly along the periphery of the left lung and minimally in the right lung base as well. No pneumothorax or effusion. The aorta is calcified. The remaining cardiomediastinal contours are unremarkable. No acute osseous or soft tissue abnormality. Degenerative changes are present in the imaged spine and shoulders. Telemetry leads overlie the chest. IMPRESSION: Heterogeneous opacities throughout the lungs, greatest in the left lung periphery concerning for pneumonia including potential viral etiology. Aortic Atherosclerosis (ICD10-I70.0). Electronically Signed   By: Kreg Shropshire M.D.   On: 05/21/2020 03:50   DG Abd Portable 1V  Result Date: 05/23/2020 CLINICAL DATA:  Encounter for orogastric tube placement. EXAM: PORTABLE ABDOMEN - 1 VIEW COMPARISON:  06/09/2009 and chest radiograph 05/22/2020 FINDINGS: Gastric tube extends into the abdomen and the tip is in the midline of the lower abdomen, likely in the distal stomach body region. Again noted are patchy densities at the left lung base. Few gas-filled loops of bowel in the abdomen. IMPRESSION: Orogastric tube in the lower abdominal  midline and likely in the distal stomach body region. Electronically Signed   By: Richarda Overlie M.D.   On: 05/23/2020 12:17   ECHOCARDIOGRAM COMPLETE  Result Date: 05/23/2020    ECHOCARDIOGRAM REPORT   Patient Name:   JOLON DEGANTE Date of Exam: 05/23/2020 Medical Rec #:  [id number]      Height:       72.0 in Accession #:    [id number]     Weight:       142.4 lb Date of Birth:  03-16-1951      BSA:          1.844 m Patient Age:    69 years       BP:           112/85 mmHg Patient Gender: M  HR:           96 bpm. Exam Location:  Inpatient Procedure: 2D Echo, Cardiac Doppler and Color Doppler STAT ECHO Indications:    I26.02 Pulmonary embolus  History:        Patient has no prior history of Echocardiogram examinations.                 Arrythmias:Cardiac Arrest; Signs/Symptoms:Dyspnea, Hypotension                 and Altered Mental Status. Covid 19 positive.  Sonographer:    Sheralyn Boatman RDCS Referring Phys: [id number] Martina Sinner  Sonographer Comments: Technically difficult study due to poor echo windows and echo performed with patient supine and on artificial respirator. IMPRESSIONS  1. Severe RV failure and cor pulmonale.  2. Abnormal septal motion and septal flattening consistent with RV pressure overload. Left ventricular ejection fraction, by estimation, is 50 to 55%. The left ventricle has low normal function. The left ventricle has no regional wall motion abnormalities. Left ventricular diastolic parameters were normal.  3. Right ventricular systolic function is severely reduced. The right ventricular size is severely enlarged. There is mildly elevated pulmonary artery systolic pressure.  4. The mitral valve is normal in structure. No evidence of mitral valve regurgitation. No evidence of mitral stenosis.  5. The aortic valve is normal in structure. Aortic valve regurgitation is not visualized. No aortic stenosis is present.  6. The inferior vena cava is dilated in size with <50% respiratory  variability, suggesting right atrial pressure of 15 mmHg. FINDINGS  Left Ventricle: Abnormal septal motion and septal flattening consistent with RV pressure overload. Left ventricular ejection fraction, by estimation, is 50 to 55%. The left ventricle has low normal function. The left ventricle has no regional wall motion abnormalities. The left ventricular internal cavity size was normal in size. There is no left ventricular hypertrophy. Left ventricular diastolic parameters were normal. Right Ventricle: The right ventricular size is severely enlarged. Right vetricular wall thickness was not assessed. Right ventricular systolic function is severely reduced. There is mildly elevated pulmonary artery systolic pressure. The tricuspid regurgitant velocity is 2.67 m/s, and with an assumed right atrial pressure of 8 mmHg, the estimated right ventricular systolic pressure is 36.5 mmHg. Left Atrium: Left atrial size was normal in size. Right Atrium: Right atrial size was normal in size. Pericardium: There is no evidence of pericardial effusion. Mitral Valve: The mitral valve is normal in structure. No evidence of mitral valve regurgitation. No evidence of mitral valve stenosis. Tricuspid Valve: The tricuspid valve is normal in structure. Tricuspid valve regurgitation is mild . No evidence of tricuspid stenosis. Aortic Valve: The aortic valve is normal in structure. Aortic valve regurgitation is not visualized. No aortic stenosis is present. Pulmonic Valve: The pulmonic valve was normal in structure. Pulmonic valve regurgitation is not visualized. No evidence of pulmonic stenosis. Aorta: The aortic root is normal in size and structure. Venous: The inferior vena cava is dilated in size with less than 50% respiratory variability, suggesting right atrial pressure of 15 mmHg. IAS/Shunts: No atrial level shunt detected by color flow Doppler. Additional Comments: Severe RV failure and cor pulmonale.  LEFT VENTRICLE PLAX 2D LVIDd:          3.70 cm     Diastology LVIDs:         2.80 cm     LV e' medial:    4.46 cm/s LV PW:  1.10 cm     LV E/e' medial:  4.9 LV IVS:        1.10 cm     LV e' lateral:   6.97 cm/s LVOT diam:     2.10 cm     LV E/e' lateral: 3.1 LV SV:         28 LV SV Index:   15 LVOT Area:     3.46 cm  LV Volumes (MOD) LV vol d, MOD A2C: 22.6 ml LV vol d, MOD A4C: 25.5 ml LV vol s, MOD A2C: 11.1 ml LV vol s, MOD A4C: 11.7 ml LV SV MOD A2C:     11.5 ml LV SV MOD A4C:     25.5 ml LV SV MOD BP:      13.7 ml RIGHT VENTRICLE            IVC RV S prime:     8.84 cm/s  IVC diam: 2.50 cm TAPSE (M-mode): 1.3 cm LEFT ATRIUM           Index      RIGHT ATRIUM           Index LA diam:      2.80 cm 1.52 cm/m RA Area:     14.30 cm LA Vol (A4C): 10.5 ml 5.69 ml/m RA Volume:   42.70 ml  23.15 ml/m  AORTIC VALVE LVOT Vmax:   53.80 cm/s LVOT Vmean:  41.900 cm/s LVOT VTI:    0.080 m  AORTA Ao Root diam: 3.60 cm Ao Asc diam:  3.50 cm MITRAL VALVE               TRICUSPID VALVE MV Area (PHT): 3.27 cm    TR Peak grad:   28.5 mmHg MV Decel Time: 232 msec    TR Vmax:        267.00 cm/s MV E velocity: 21.94 cm/s MV A velocity: 41.30 cm/s  SHUNTS MV E/A ratio:  0.53        Systemic VTI:  0.08 m                            Systemic Diam: 2.10 cm Charlton HawsPeter Nishan MD Electronically signed by Charlton HawsPeter Nishan MD Signature Date/Time: 05/23/2020/10:46:38 AM    Final    CT RENAL STONE STUDY  Result Date: 05/21/2020 CLINICAL DATA:  [id number] year old male with low back pain, flank pain, decreased p.o. EXAM: CT ABDOMEN AND PELVIS WITHOUT CONTRAST TECHNIQUE: Multidetector CT imaging of the abdomen and pelvis was performed following the standard protocol without IV contrast. COMPARISON:  Noncontrast CT Abdomen and Pelvis 10/28/2008. Portable chest radiograph 04/19/2010. FINDINGS: Lower chest: Patchy, irregular peribronchial and peripheral scattered pulmonary opacity at both lung bases. This seems to be new from last month. Underlying probable pulmonary hyperinflation.  Some associated lung base bronchiectasis. No cavitating areas identified. No cardiomegaly, pericardial effusion or pleural effusion. Hepatobiliary: Negative noncontrast liver and gallbladder. Pancreas: Negative. Spleen: Negative. Adrenals/Urinary Tract: Bulky left nephrolithiasis measuring 13 mm at the renal pelvis. Additional left lower pole stone. No hydronephrosis. Negative noncontrast right kidney. No hydroureter. Unremarkable urinary bladder. Incidental pelvic phleboliths. Stomach/Bowel: Decompressed large bowel. There is long segment circumferential wall thickening in the right colon, up to the 12 mm series 4, image 49) with some areas of adjacent mesenteric stranding (coronal image 43). The wall thickening gradually decreases through the transverse colon. No free air. No free fluid. The terminal ileum appear spared. Appendix seems  to remain normal on coronal image 32. No dilated small bowel.  Decompressed stomach and duodenum. Vascular/Lymphatic: Normal caliber abdominal aorta. Mild calcified atherosclerosis. Vascular patency is not evaluated in the absence of IV contrast. No lymphadenopathy identified in the absence of contrast. Reproductive: Negative. Other: Previous lower abdominal ventral hernia repair with mesh. No pelvic free fluid. Musculoskeletal: Degenerative changes in the spine. No acute osseous abnormality identified. IMPRESSION: 1. In the abdomen there is circumferential bowel wall thickening and mesenteric stranding involving the right colon. Wall thickening gradually abates through the transverse colon. This is compatible with Acute Nonspecific Colitis. 2. But the lung bases are also abnormal with patchy and irregular peribronchial and peripheral pulmonary opacity suspicious for acute viral/atypical respiratory infection. No areas of cavitation to suggest septic emboli. No pleural effusion. 3. Bulky left nephrolithiasis, but no obstructive uropathy. 4. Previous lower abdominal hernia repair  with mesh. Electronically Signed   By: Odessa Fleming M.D.   On: 05/21/2020 02:46   VAS [place] LOWER EXTREMITY VENOUS (DVT)  Result Date: 05/24/2020  Lower Venous DVT Study Indications: Covid-19, pulmonary embolism.  Risk Factors: Confirmed PE. Anticoagulation: Heparin. Limitations: Body habitus, ventilation, did not perform compressions in thigh and popliteal fossa of the left lower extremity secondary to mobile thrombus, bandages and line. Comparison Study: No prior study Performing Technologist: Sherren Kerns RVS  Examination Guidelines: A complete evaluation includes B-mode imaging, spectral Doppler, color Doppler, and power Doppler as needed of all accessible portions of each vessel. Bilateral testing is considered an integral part of a complete examination. Limited examinations for reoccurring indications may be performed as noted. The reflux portion of the exam is performed with the patient in reverse Trendelenburg.  +---------+---------------+---------+-----------+----------+--------------+ RIGHT    CompressibilityPhasicitySpontaneityPropertiesThrombus Aging +---------+---------------+---------+-----------+----------+--------------+ CFV      Full           Yes      No                                  +---------+---------------+---------+-----------+----------+--------------+ SFJ      Full                                                        +---------+---------------+---------+-----------+----------+--------------+ FV Prox  Full                                                        +---------+---------------+---------+-----------+----------+--------------+ FV Mid   Full                                                        +---------+---------------+---------+-----------+----------+--------------+ FV DistalFull                                                        +---------+---------------+---------+-----------+----------+--------------+ PFV  Full                                                         +---------+---------------+---------+-----------+----------+--------------+ POP      Full           Yes      No                                  +---------+---------------+---------+-----------+----------+--------------+ PTV      Full                                                        +---------+---------------+---------+-----------+----------+--------------+ PERO     Full                                                        +---------+---------------+---------+-----------+----------+--------------+   +---------+---------------+---------+-----------+----------+-------------------+ LEFT     CompressibilityPhasicitySpontaneityPropertiesThrombus Aging      +---------+---------------+---------+-----------+----------+-------------------+ CFV                                                   not visualized                                                            secondary to                                                              bandage/line        +---------+---------------+---------+-----------+----------+-------------------+ SFJ                                                   not visualized                                                            secondary to  bandage/line        +---------+---------------+---------+-----------+----------+-------------------+ FV Prox  Full                                         patent by color     +---------+---------------+---------+-----------+----------+-------------------+ FV Mid                                                patent by color     +---------+---------------+---------+-----------+----------+-------------------+ FV Distal                                             patent by color      +---------+---------------+---------+-----------+----------+-------------------+ PFV                                                   patent by color     +---------+---------------+---------+-----------+----------+-------------------+ POP                                                   patent by color     +---------+---------------+---------+-----------+----------+-------------------+ PTV      Full                                                             +---------+---------------+---------+-----------+----------+-------------------+ PERO     Full                                                             +---------+---------------+---------+-----------+----------+-------------------+     Summary: RIGHT: - There is no evidence of deep vein thrombosis in the lower extremity.  vessels are dilated with significantly sluggish flow, throughout  LEFT: - Findings consistent with acute deep vein thrombosis involving the left femoral vein. - Acute, mobile thrombus noted in the proximal and mid femoral vein. Vessels are dilated with significantly sluggish flow, throughout.  *See table(s) above for measurements and observations. Electronically signed by Waverly Ferrari MD on 05/24/2020 at 6:21:22 PM.    Final

## 2020-06-05 LAB — GLUCOSE, CAPILLARY
Glucose-Capillary: 109 mg/dL — ABNORMAL HIGH (ref 70–99)
Glucose-Capillary: 111 mg/dL — ABNORMAL HIGH (ref 70–99)
Glucose-Capillary: 131 mg/dL — ABNORMAL HIGH (ref 70–99)
Glucose-Capillary: 134 mg/dL — ABNORMAL HIGH (ref 70–99)
Glucose-Capillary: 84 mg/dL (ref 70–99)
Glucose-Capillary: 95 mg/dL (ref 70–99)

## 2020-06-05 LAB — CBC
HCT: 30 % — ABNORMAL LOW (ref 39.0–52.0)
Hemoglobin: 9.9 g/dL — ABNORMAL LOW (ref 13.0–17.0)
MCH: 28.9 pg (ref 26.0–34.0)
MCHC: 33 g/dL (ref 30.0–36.0)
MCV: 87.7 fL (ref 80.0–100.0)
Platelets: 387 10*3/uL (ref 150–400)
RBC: 3.42 MIL/uL — ABNORMAL LOW (ref 4.22–5.81)
RDW: 14.5 % (ref 11.5–15.5)
WBC: 10.7 10*3/uL — ABNORMAL HIGH (ref 4.0–10.5)
nRBC: 0 % (ref 0.0–0.2)

## 2020-06-05 LAB — COMPREHENSIVE METABOLIC PANEL
ALT: 66 U/L — ABNORMAL HIGH (ref 0–44)
AST: 49 U/L — ABNORMAL HIGH (ref 15–41)
Albumin: 1.5 g/dL — ABNORMAL LOW (ref 3.5–5.0)
Alkaline Phosphatase: 154 U/L — ABNORMAL HIGH (ref 38–126)
Anion gap: 8 (ref 5–15)
BUN: 8 mg/dL (ref 8–23)
CO2: 23 mmol/L (ref 22–32)
Calcium: 7.9 mg/dL — ABNORMAL LOW (ref 8.9–10.3)
Chloride: 100 mmol/L (ref 98–111)
Creatinine, Ser: 0.97 mg/dL (ref 0.61–1.24)
GFR, Estimated: >60 mL/min (ref >60)
Glucose, Bld: 97 mg/dL (ref 70–99)
Potassium: 4.9 mmol/L (ref 3.5–5.1)
Sodium: 131 mmol/L — ABNORMAL LOW (ref 135–145)
Total Bilirubin: 1.1 mg/dL (ref 0.3–1.2)
Total Protein: 4.5 g/dL — ABNORMAL LOW (ref 6.5–8.1)

## 2020-06-05 LAB — MAGNESIUM: Magnesium: 1.8 mg/dL (ref 1.7–2.4)

## 2020-06-05 MED ORDER — MAGNESIUM SULFATE 2 GM/50ML IV SOLN
2.0000 g | Freq: Once | INTRAVENOUS | Status: AC
  Administered 2020-06-05: 2 g via INTRAVENOUS
  Filled 2020-06-05: qty 50

## 2020-06-05 NOTE — Progress Notes (Signed)
PROGRESS NOTE                                                                                                                                                                                                             Patient Demographics:    Dwayne Barnes, is a 69 y.o. male, DOB - 01/03/1951, ZSW:[id number]  Outpatient Primary MD for the patient is [name], PA-C   Admit date - 05/20/2020   LOS - 15  Chief Complaint  Patient presents with  . Fatigue       Brief Narrative: Patient is a 69 y.o. male with no significant past medical history-presented to the ED on 11/17 with nausea/vomiting, weakness and back pain-further evaluation revealed COVID-19 infection and a large PE.  Patient was started on IV heparin infusion along with steroid/Remdesivir-on 11/19-patient had a cardiac arrest-was intubated during the code-total downtime of around 6 minutes before ROSC.  Patient was subsequently transferred to the ICU-unfortunately soon after arrival in the ICU-he had another cardiac arrest-and 4 rounds of CPR were provided with ROSC.  Patient was provided supportive care-he subsequently improved-self extubated on 11/23-he was [facility] on 11/25.  Further hospital course complicated by diarrhea secondary to C. difficile along with AKI and hyponatremia.See below for further details.   COVID-19 vaccinated status: Vaccinated  Significant Events: 11/17>> Admit to [facility] for PE/COVID-19 infection 11/19>> PEA cardiac arrest x 2-given TPA-intubated-transfer to ICU 11/23>> Self extubated 11/25>> transfer to [facility] 11/29>> worsening diarrhea-CT abdomen with progressive colitis  Significant studies: 11/18>>CT renal stone: non specific colitis, GGO's, left nephrolithiasis without obstructive uropathy. 11/18>>CTA chest: large PC in LLL, GGO's LLL. 11/20 echo>> EF 50-55%, RV systolic function severely  reduced. 11/21>> B/L extremity Doppler: acute DVTin left femoral vein, acute mobile thrombus in the proximal femoral vein. 11/23>>CT Head: unremarkable   11/29>> CT abdomen/pelvis>> progressive colitis  COVID-19 medications: Steroids: 11/17>>11/26 Remdesivir: 11/17>> 11/22  Antibiotics: Oral vancomycin: 11/30>>  Microbiology data: 11/18>> urine culture: Insignificant growth 11/30>> stool C. difficile: +ve 11/30>> GI pathogen panel: negative  Procedures: ETT>>11/19 - 11/23 IVC filter placement by IR>>11/21.  Consults: PCCM, IR  DVT prophylaxis: Therapeutic Lovenox>> Eliquis    Subjective:   Very hard of hearing-Per nursing staff-continues to have some loose stools but clearly slowing down.   Assessment  & Plan :  Cardiac arrest due to large PE: S/p TPA-was on therapeutic Lovenox-we will transition to Eliquis today. PE likely provoked by COVID-19 infection.  PCCM MD discussed with Dr. Illa Level indication for Impella support.  Continue telemetry monitoring.  Echo as above.  Large PE with DVT: Continue therapeutic anticoagulation-he is s/p TPA on 11/19 when he had cardiac arrest.  Patient is s/p IVC filter given mobile thrombus seen on lower extremity Doppler.  Once oral intake was stabilized-he was transitioned to Eliquis.  Acute hypoxic respiratory failure: Due to PE/cardiac arrest/COVID-19 pneumonia-self extubated on 11/23-currently stable on room air.  Acute metabolic encephalopathy/ICU delirium/possible anoxic injury: Mental status continues to slowly improve-getting more fluent with responses.  CT head without acute changes.  Continue supportive care.  Minimize all sedating medications.    Dysphagia: In the setting of cardiac arrest-seen by SLP-required NG tube-subsequently seen by SLP-diet gradually upgraded to a dysphagia 3 diet.  C. difficile colitis: Diarrhea improving-leukocytosis/AKI all getting better.  Continue oral vancomycin.  AKI: Likely hemodynamically  mediated-due to diarrhea-has resolved with IV fluids and improvement in diarrhea.  Hyponatremia: Due to diarrhea/volume loss-improved with IV fluids-continue close monitoring-decrease LR to 50 cc an hour.  Hypokalemia: Repleted.  Hypomagnesemia: Continue to replete and recheck.  COVID-19 pneumonia: Completed a course of Remdesivir-an steroids.  Hypoxia has resolved.   Paroxysmal atrial fibrillation: appears to be back in sinus rhythm-on anticoagulation for PE.  Echo as above.    Normocytic anemia: Secondary to critical illness-stable for monitoring-no indication for PRBC transfusion.  Transaminitis: Likely secondary to COVID-19-downtrending.  Deconditioning/debility: Secondary to critical illness- PT/OT eval completed-recommendations are for SNF   Hard of hearing  GI prophylaxis: H2 blocker.  ABG:    Component Value Date/Time   PHART 7.417 05/25/2020 0506   PCO2ART 45.4 05/25/2020 0506   PO2ART 164 (H) 05/25/2020 0506   HCO3 29.2 (H) 05/25/2020 0506   TCO2 31 05/25/2020 0506   ACIDBASEDEF 2.0 05/22/2020 1948   O2SAT 99.0 05/25/2020 0506    Vent Settings: N/A   Condition - Extremely Guarded  Family Communication  :  Daughter Gianfranco Araki [phone])  updated over the phone 12/3  Code Status :  Full Code  Diet :  Diet Order            DIET DYS 3 Room service appropriate? Yes; Fluid consistency: Thin  Diet effective now                  Disposition Plan  :   Status is: Inpatient  Remains inpatient appropriate because:Inpatient level of care appropriate due to severity of illness   Dispo: The patient is from: Home              Anticipated d/c is to: SNF              Anticipated d/c date is: > 1-2 days              Patient currently is not medically stable to d/c.   Barriers to discharge: C Diff colitis-AKI-Hyponatremia/Hypokalemia  Antimicorbials  :    Anti-infectives (From admission, onward)   Start     Dose/Rate Route Frequency Ordered Stop    06/02/20 1000  vancomycin (VANCOCIN) 50 mg/mL oral solution 125 mg        125 mg Oral 4 times daily 06/02/20 0800 06/12/20 0959   05/22/20 1000  remdesivir 100 mg in sodium chloride 0.9 % 100 mL IVPB       "Followed by" Linked Group  Details   100 mg 200 mL/hr over 30 Minutes Intravenous Daily 05/21/20 0857 05/25/20 1154   05/21/20 1030  remdesivir 200 mg in sodium chloride 0.9% 250 mL IVPB  Status:  Discontinued       "Followed by" Linked Group Details   200 mg 580 mL/hr over 30 Minutes Intravenous Once 05/21/20 0857 05/27/20 0734   05/21/20 0330  metroNIDAZOLE (FLAGYL) tablet 500 mg        500 mg Oral  Once 05/21/20 0320 05/21/20 0411   05/20/20 2330  cefTRIAXone (ROCEPHIN) 2 g in sodium chloride 0.9 % 100 mL IVPB        2 g 200 mL/hr over 30 Minutes Intravenous  Once 05/20/20 2324 05/21/20 0331      Inpatient Medications  Scheduled Meds: . apixaban  10 mg Oral BID   Followed by  . [START ON 06/07/2020] apixaban  5 mg Oral BID  . Chlorhexidine Gluconate Cloth  6 each Topical Daily  . famotidine  20 mg Oral Daily  . feeding supplement  237 mL Oral TID BM  . insulin aspart  0-9 Units Subcutaneous Q4H  . sodium chloride flush  10-40 mL Intracatheter Q12H  . vancomycin  125 mg Oral QID   Continuous Infusions: . lactated ringers with kcl 75 mL/hr at 06/04/20 2225   PRN Meds:.acetaminophen (TYLENOL) oral liquid 160 mg/5 mL, [DISCONTINUED] ondansetron **OR** ondansetron (ZOFRAN) IV, sodium chloride flush   Time Spent in minutes  25   See all Orders from today for further details   Jeoffrey Massed M.D on 06/05/2020 at 12:38 PM  To page go to [email or web address] - use universal password  [facility] -  Office  [phone]    Objective:   Vitals:   06/04/20 1159 06/04/20 2011 06/05/20 0446 06/05/20 0451  BP: (!) 110/50 101/60  (!) 101/59  Pulse: 72 85  75  Resp: 18 20  18   Temp: 98.4 F (36.9 C) 98.4 F (36.9 C)  98.3 F (36.8 C)  TempSrc: Oral Oral  Oral  SpO2: 96%  97%  97%  Weight:   60.7 kg   Height:        Wt Readings from Last 3 Encounters:  06/05/20 60.7 kg  12/20/14 66 kg     Intake/Output Summary (Last 24 hours) at 06/05/2020 1238 Last data filed at 06/05/2020 1203 Gross per 24 hour  Intake 240 ml  Output 2153 ml  Net -1913 ml     Physical Exam Gen Exam:Alert awake-not in any distress HEENT:atraumatic, normocephalic Chest: B/L clear to auscultation anteriorly CVS:S1S2 regular Abdomen:soft non tender, non distended Extremities:no edema Neurology: Non focal Skin: no rash   Data Review:    CBC Recent Labs  Lab 06/01/20 0319 06/02/20 0134 06/03/20 0117 06/04/20 0010 06/05/20 0316  WBC 24.7* 19.5* 17.0* 14.5* 10.7*  HGB 10.3* 9.2* 9.3* 9.2* 9.9*  HCT 30.9* 27.1* 26.7* 27.9* 30.0*  PLT 236 213 250 305 387  MCV 87.0 85.0 84.0 86.4 87.7  MCH 29.0 28.8 29.2 28.5 28.9  MCHC 33.3 33.9 34.8 33.0 33.0  RDW 14.8 14.1 14.0 14.3 14.5    Chemistries  Recent Labs  Lab 05/30/20 0208 05/31/20 0247 06/02/20 0134 06/02/20 0134 06/02/20 1203 06/02/20 1837 06/03/20 0117 06/04/20 0010 06/05/20 0316  NA 142   < > 123*   < > 124* 126* 128* 129* 131*  K 3.8   < > 2.9*   < > 3.5 3.2* 3.9 4.2 4.9  CL 109   < >  92*   < > 94* 95* 99 101 100  CO2 25   < > 21*   < > 22 23 21* 21* 23  GLUCOSE 105*   < > 152*   < > 116* 119* 105* 153* 97  BUN 24*   < > 21   < > CREATININE 0.93   < > 1.41*   < > 1.11 1.02 0.99 1.04 0.97  CALCIUM 8.4*   < > 7.3*   < > 7.4* 7.6* 7.5* 7.5* 7.9*  MG  --   --   --   --   --   --  1.7 1.8 1.8  AST 59*  --  43*  --   --   --  46* 57* 49*  ALT 73*  --  53*  --   --   --  57* 57* 66*  ALKPHOS 139*  --  131*  --   --   --  129* 151* 154*  BILITOT 1.0  --  1.3*  --   --   --  1.2 0.9 1.1   < > = values in this interval not displayed.   ------------------------------------------------------------------------------------------------------------------ No results for input(s): CHOL, HDL, LDLCALC,  TRIG, CHOLHDL, LDLDIRECT in the last 72 hours.  Lab Results  Component Value Date   HGBA1C 5.5 05/22/2020   ------------------------------------------------------------------------------------------------------------------ No results for input(s): TSH, T4TOTAL, T3FREE, THYROIDAB in the last 72 hours.  Invalid input(s): FREET3 ------------------------------------------------------------------------------------------------------------------ No results for input(s): VITAMINB12, FOLATE, FERRITIN, TIBC, IRON, RETICCTPCT in the last 72 hours.  Coagulation profile No results for input(s): INR, PROTIME in the last 168 hours.  No results for input(s): DDIMER in the last 72 hours.  Cardiac Enzymes No results for input(s): CKMB, TROPONINI, MYOGLOBIN in the last 168 hours.  Invalid input(s): CK ------------------------------------------------------------------------------------------------------------------    Component Value Date/Time   BNP 499.2 (H) 05/23/2020 1244    Micro Results Recent Results (from the past 240 hour(s))  C Difficile Quick Screen (NO PCR Reflex)     Status: Abnormal   Collection Time: 06/02/20  8:41 AM   Specimen: STOOL  Result Value Ref Range Status   C Diff antigen POSITIVE (A) NEGATIVE Final   C Diff toxin POSITIVE (A) NEGATIVE Final   C Diff interpretation Toxin producing C. difficile detected.  Final    Comment: CRITICAL RESULT CALLED TO, READ BACK BY AND VERIFIED WITH: Tenna Child RN 12:05 06/02/20 ([email or web address]) Performed at [facility], [address], [place], [place] [address]   Gastrointestinal Panel by PCR , Stool     Status: None   Collection Time: 06/02/20  9:41 AM   Specimen: STOOL  Result Value Ref Range Status   Campylobacter species NOT DETECTED NOT DETECTED Final   Plesimonas shigelloides NOT DETECTED NOT DETECTED Final   Salmonella species NOT DETECTED NOT DETECTED Final   Yersinia enterocolitica NOT DETECTED NOT DETECTED Final   Vibrio  species NOT DETECTED NOT DETECTED Final   Vibrio cholerae NOT DETECTED NOT DETECTED Final   Enteroaggregative E coli (EAEC) NOT DETECTED NOT DETECTED Final   Enteropathogenic E coli (EPEC) NOT DETECTED NOT DETECTED Final   Enterotoxigenic E coli (ETEC) NOT DETECTED NOT DETECTED Final   Shiga like toxin producing E coli (STEC) NOT DETECTED NOT DETECTED Final   Shigella/Enteroinvasive E coli (EIEC) NOT DETECTED NOT DETECTED Final   Cryptosporidium NOT DETECTED NOT DETECTED Final   Cyclospora cayetanensis NOT DETECTED NOT DETECTED Final  Entamoeba histolytica NOT DETECTED NOT DETECTED Final   Giardia lamblia NOT DETECTED NOT DETECTED Final   Adenovirus F40/41 NOT DETECTED NOT DETECTED Final   Astrovirus NOT DETECTED NOT DETECTED Final   Norovirus GI/GII NOT DETECTED NOT DETECTED Final   Rotavirus A NOT DETECTED NOT DETECTED Final   Sapovirus (I, II, IV, and V) NOT DETECTED NOT DETECTED Final    Comment: Performed at [facility], [address], [place], [place] [address]    Radiology Reports CT ABDOMEN PELVIS WO CONTRAST  Result Date: 06/01/2020 CLINICAL DATA:  Left upper quadrant diarrhea, history of COVID-19 pneumonia. EXAM: CT ABDOMEN AND PELVIS WITHOUT CONTRAST TECHNIQUE: Multidetector CT imaging of the abdomen and pelvis was performed following the standard protocol without IV contrast. COMPARISON:  05/21/2020 FINDINGS: Lower chest: Bibasilar consolidation is noted increased when compared with the prior exam. These changes are worse on the left than the right. Stable changes of prior COVID-19 pneumonia are noted. No sizable effusion is seen. Hepatobiliary: No focal liver abnormality is seen. No gallstones, gallbladder wall thickening, or biliary dilatation. Pancreas: Unremarkable. No pancreatic ductal dilatation or surrounding inflammatory changes. Spleen: Normal in size without focal abnormality. Adrenals/Urinary Tract: Adrenal glands are within normal limits. Kidneys  demonstrate left renal pelvis stone stable in appearance. No obstructive changes are seen. The bladder is well distended. Stomach/Bowel: Diffuse edematous changes of the colonic wall are seen throughout its course increased when compared with the prior exam consistent with a generalized colitis. Some pericolonic inflammatory changes noted particularly on the right increased from the prior exam. The appendix appears within normal limits. Small bowel and stomach are unremarkable. Vascular/Lymphatic: Atherosclerotic calcifications of the aorta are noted. IVC filter is noted in place. No sizable lymphadenopathy is noted. Reproductive: Prostate is unremarkable. Other: No abdominal wall hernia or abnormality. No abdominopelvic ascites. Changes of prior hernia repair are noted in the lower abdomen bilaterally Musculoskeletal: Degenerative changes of lumbar spine are noted. IMPRESSION: Increasing colonic wall thickness with pericolonic inflammatory change consistent with progressive colitis. Increasing bilateral lower lobe consolidation without sizable effusion. Some scattered changes consistent with the COVID-19 pneumonia are noted. Stable nonobstructing left renal stone. Electronically Signed   By: Alcide Clever M.D.   On: 06/01/2020 23:59   CT HEAD WO CONTRAST  Result Date: 05/26/2020 CLINICAL DATA:  69 year old male with altered mental status. EXAM: CT HEAD WITHOUT CONTRAST TECHNIQUE: Contiguous axial images were obtained from the base of the skull through the vertex without intravenous contrast. COMPARISON:  Head CT dated 04/18/2010. FINDINGS: Brain: The ventricles and sulci appropriate size for patient's age. The gray-white matter discrimination is preserved. There is no acute intracranial hemorrhage. No mass effect midline shift. No extra-axial fluid collection. Vascular: No hyperdense vessel or unexpected calcification. Skull: Normal. Negative for fracture or focal lesion. Sinuses/Orbits: No acute finding.  Other: None IMPRESSION: Unremarkable noncontrast CT of the brain. Electronically Signed   By: Elgie Collard M.D.   On: 05/26/2020 15:45   CT ANGIO CHEST PE W OR WO CONTRAST  Addendum Date: 05/21/2020   ADDENDUM REPORT: 05/21/2020 19:28 ADDENDUM: Critical Value/emergent results were called by telephone at the time of interpretation on 05/21/2020 at 7:27 pm to provider Dr. Dierdre Highman, who verbally acknowledged these results. Of note the airspace disease in the LEFT lower lobe is progressed from CT same day. Differential remains the same. Electronically Signed   By: Genevive Bi M.D.   On: 05/21/2020 19:28   Result Date: 05/21/2020 CLINICAL DATA:  PE suspected, high probability.  COVID-19 infection EXAM: CT ANGIOGRAPHY CHEST WITH CONTRAST TECHNIQUE: Multidetector CT imaging of the chest was performed using the standard protocol during bolus administration of intravenous contrast. Multiplanar CT image reconstructions and MIPs were obtained to evaluate the vascular anatomy. CONTRAST:  75mL OMNIPAQUE IOHEXOL 300 MG/ML  SOLN COMPARISON:  None. FINDINGS: Cardiovascular: Large filling defect within the proximal LEFT lower lobe pulmonary artery. Filling defect spans the bifurcation of the LEFT lower lobe and lingular branch pulmonary branches. This this filling defect/thrombus is occlusive to the LEFT lower lobe. No LEFT upper lobe filling defects identified. No filling defect identified within the RIGHT lung pulmonary arteries. No evidence of RIGHT ventricular strain with the RV to LV ratio less than 1. Coronary artery calcification and aortic atherosclerotic calcification. Mediastinum/Nodes: No axillary or supraclavicular adenopathy. No mediastinal or hilar adenopathy. No pericardial fluid. Esophagus normal. Lungs/Pleura: There is diffuse very fine airspace disease within the LEFT lower lobe involving the majority of the LEFT lower lobe. Similar milder findings in the posterior RIGHT lower lobe and lingula. Upper  Abdomen: Limited view of the liver, kidneys, pancreas are unremarkable. Normal adrenal glands. Musculoskeletal: No aggressive osseous lesion. Review of the MIP images confirms the above findings. IMPRESSION: 1. Large occlusive pulmonary embolism within the proximal LEFT lower lobe pulmonary artery and lingular pulmonary artery. 2. Diffuse fine airspace disease within the LEFT lower lobe with differential including pulmonary infarction, pulmonary hemorrhage, or pneumonia (including COVID pneumonia). 3. No evidence of RIGHT ventricular strain. 4. Coronary artery calcification and aortic atherosclerotic calcification. Critical Value/emergent results were called by telephone at the time of interpretation on 05/21/2020 at 6:55 pm to provider [facility] , who verbally acknowledged these results. Electronically Signed: By: Genevive Bi M.D. On: 05/21/2020 18:59   IR IVC FILTER PLMT / S&I Lenise Arena GUID/MOD SED  Result Date: 05/24/2020 INDICATION: 69 year old with recent cardiac arrest likely secondary to pulmonary embolism and COVID-19. Subsequent right ventricular failure. Patient has clot in the left pulmonary arteries and clot in left femoral vein. There is concern that the patient would not tolerate additional clot burden in the pulmonary arteries and request for IVC filter placement. EXAM: IVC FILTER PLACEMENT; IVC VENOGRAM; ULTRASOUND FOR VASCULAR ACCESS Physician: Rachelle Hora. Lowella Dandy, MD MEDICATIONS: None. ANESTHESIA/SEDATION: Versed 1 mg The patient was continuously monitored during the procedure by the interventional radiology nurse under my direct supervision. CONTRAST:  Carbon dioxide FLUOROSCOPY TIME:  Fluoroscopy Time: 3 minutes, 18 seconds, 18 mGy COMPLICATIONS: None immediate. PROCEDURE: Informed consent was obtained for an IVC filter placement from the patient's daughter. Ultrasound demonstrated a patent right internal jugular vein. Ultrasound images were obtained for documentation. The right neck was  prepped and draped in a sterile fashion. Maximal barrier sterile technique was utilized including caps, mask, sterile gowns, sterile gloves, sterile drape, hand hygiene and skin antiseptic. The skin was anesthetized with 1% lidocaine. A 21 gauge needle was directed into the vein with ultrasound guidance and a micropuncture dilator set was placed. A wire was advanced into the IVC. The filter sheath was advanced over the wire into the IVC. An IVC venogram was performed with carbon dioxide. Bilateral renal veins were cannulated using a 5 French catheter and Bentson wire. Bilateral renal veins are identified. Fluoroscopic images were obtained for documentation. A Bard Denali filter was deployed below the lowest renal vein. Post placement images of the filter were obtained. The vascular sheath was removed with manual compression. Bandage placed over the puncture site. FINDINGS: IVC was patent. Bilateral renal veins were  identified. The filter was deployed below the lowest renal vein. IMPRESSION: Successful placement of a retrievable IVC filter. PLAN: This IVC filter is potentially retrievable. The patient will be assessed for filter retrieval by Interventional Radiology in approximately 8-12 weeks. Further recommendations regarding filter retrieval, continued surveillance or declaration of device permanence, will be made at that time. Electronically Signed   By: Richarda Overlie M.D.   On: 05/24/2020 17:57   DG CHEST PORT 1 VIEW  Result Date: 05/26/2020 CLINICAL DATA:  Acute respiratory failure.  Hypoxia. EXAM: PORTABLE CHEST 1 VIEW COMPARISON:  05/22/2020. FINDINGS: Interim extubation and removal of NG tube. Left IJ line in stable position. Heart size normal. Diffuse left lung infiltrate. Right base infiltrate. No prominent pleural effusion. No pneumothorax. IMPRESSION: 1. Interim extubation and removal of NG tube. Left IJ line stable position. 2. Diffuse left lung infiltrate and right base infiltrate. Electronically  Signed   By: Maisie Fus  Register   On: 05/26/2020 05:50   DG CHEST PORT 1 VIEW  Result Date: 05/22/2020 CLINICAL DATA:  Central line placement, intubation EXAM: PORTABLE CHEST 1 VIEW COMPARISON:  05/21/2020 FINDINGS: Left central line has been placed with the tip in the SVC. No pneumothorax. Endotracheal tube is 5 cm above the carina. Patchy airspace disease throughout the left lung. No focal opacity on the right. Heart is normal size. IMPRESSION: Endotracheal tube 5 cm above the carina. Left central line tip in the SVC. No pneumothorax. Stable patchy left lung airspace disease. Electronically Signed   By: Charlett Nose M.D.   On: 05/22/2020 21:06   DG Chest Port 1 View  Result Date: 05/21/2020 CLINICAL DATA:  Cough, lower back pain, has not eaten 5 days, emesis EXAM: PORTABLE CHEST 1 VIEW COMPARISON:  Radiograph 04/19/2010, CT 04/18/2010 FINDINGS: Heterogeneous opacities present predominantly along the periphery of the left lung and minimally in the right lung base as well. No pneumothorax or effusion. The aorta is calcified. The remaining cardiomediastinal contours are unremarkable. No acute osseous or soft tissue abnormality. Degenerative changes are present in the imaged spine and shoulders. Telemetry leads overlie the chest. IMPRESSION: Heterogeneous opacities throughout the lungs, greatest in the left lung periphery concerning for pneumonia including potential viral etiology. Aortic Atherosclerosis (ICD10-I70.0). Electronically Signed   By: Kreg Shropshire M.D.   On: 05/21/2020 03:50   DG Abd Portable 1V  Result Date: 05/23/2020 CLINICAL DATA:  Encounter for orogastric tube placement. EXAM: PORTABLE ABDOMEN - 1 VIEW COMPARISON:  06/09/2009 and chest radiograph 05/22/2020 FINDINGS: Gastric tube extends into the abdomen and the tip is in the midline of the lower abdomen, likely in the distal stomach body region. Again noted are patchy densities at the left lung base. Few gas-filled loops of bowel in the  abdomen. IMPRESSION: Orogastric tube in the lower abdominal midline and likely in the distal stomach body region. Electronically Signed   By: Richarda Overlie M.D.   On: 05/23/2020 12:17   ECHOCARDIOGRAM COMPLETE  Result Date: 05/23/2020    ECHOCARDIOGRAM REPORT   Patient Name:   KHOLE BRANCH Date of Exam: 05/23/2020 Medical Rec #:  [id number]      Height:       72.0 in Accession #:    [id number]     Weight:       142.4 lb Date of Birth:  Oct 14, 1950      BSA:          1.844 m Patient Age:    24 years  BP:           112/85 mmHg Patient Gender: M              HR:           96 bpm. Exam Location:  Inpatient Procedure: 2D Echo, Cardiac Doppler and Color Doppler STAT ECHO Indications:    I26.02 Pulmonary embolus  History:        Patient has no prior history of Echocardiogram examinations.                 Arrythmias:Cardiac Arrest; Signs/Symptoms:Dyspnea, Hypotension                 and Altered Mental Status. Covid 19 positive.  Sonographer:    Sheralyn Boatmanina West RDCS Referring Phys: [id number] Martina SinnerJONATHAN B DEWALD  Sonographer Comments: Technically difficult study due to poor echo windows and echo performed with patient supine and on artificial respirator. IMPRESSIONS  1. Severe RV failure and cor pulmonale.  2. Abnormal septal motion and septal flattening consistent with RV pressure overload. Left ventricular ejection fraction, by estimation, is 50 to 55%. The left ventricle has low normal function. The left ventricle has no regional wall motion abnormalities. Left ventricular diastolic parameters were normal.  3. Right ventricular systolic function is severely reduced. The right ventricular size is severely enlarged. There is mildly elevated pulmonary artery systolic pressure.  4. The mitral valve is normal in structure. No evidence of mitral valve regurgitation. No evidence of mitral stenosis.  5. The aortic valve is normal in structure. Aortic valve regurgitation is not visualized. No aortic stenosis is present.  6. The  inferior vena cava is dilated in size with <50% respiratory variability, suggesting right atrial pressure of 15 mmHg. FINDINGS  Left Ventricle: Abnormal septal motion and septal flattening consistent with RV pressure overload. Left ventricular ejection fraction, by estimation, is 50 to 55%. The left ventricle has low normal function. The left ventricle has no regional wall motion abnormalities. The left ventricular internal cavity size was normal in size. There is no left ventricular hypertrophy. Left ventricular diastolic parameters were normal. Right Ventricle: The right ventricular size is severely enlarged. Right vetricular wall thickness was not assessed. Right ventricular systolic function is severely reduced. There is mildly elevated pulmonary artery systolic pressure. The tricuspid regurgitant velocity is 2.67 m/s, and with an assumed right atrial pressure of 8 mmHg, the estimated right ventricular systolic pressure is 36.5 mmHg. Left Atrium: Left atrial size was normal in size. Right Atrium: Right atrial size was normal in size. Pericardium: There is no evidence of pericardial effusion. Mitral Valve: The mitral valve is normal in structure. No evidence of mitral valve regurgitation. No evidence of mitral valve stenosis. Tricuspid Valve: The tricuspid valve is normal in structure. Tricuspid valve regurgitation is mild . No evidence of tricuspid stenosis. Aortic Valve: The aortic valve is normal in structure. Aortic valve regurgitation is not visualized. No aortic stenosis is present. Pulmonic Valve: The pulmonic valve was normal in structure. Pulmonic valve regurgitation is not visualized. No evidence of pulmonic stenosis. Aorta: The aortic root is normal in size and structure. Venous: The inferior vena cava is dilated in size with less than 50% respiratory variability, suggesting right atrial pressure of 15 mmHg. IAS/Shunts: No atrial level shunt detected by color flow Doppler. Additional Comments: Severe  RV failure and cor pulmonale.  LEFT VENTRICLE PLAX 2D LVIDd:         3.70 cm     Diastology LVIDs:  2.80 cm     LV e' medial:    4.46 cm/s LV PW:         1.10 cm     LV E/e' medial:  4.9 LV IVS:        1.10 cm     LV e' lateral:   6.97 cm/s LVOT diam:     2.10 cm     LV E/e' lateral: 3.1 LV SV:         28 LV SV Index:   15 LVOT Area:     3.46 cm  LV Volumes (MOD) LV vol d, MOD A2C: 22.6 ml LV vol d, MOD A4C: 25.5 ml LV vol s, MOD A2C: 11.1 ml LV vol s, MOD A4C: 11.7 ml LV SV MOD A2C:     11.5 ml LV SV MOD A4C:     25.5 ml LV SV MOD BP:      13.7 ml RIGHT VENTRICLE            IVC RV S prime:     8.84 cm/s  IVC diam: 2.50 cm TAPSE (M-mode): 1.3 cm LEFT ATRIUM           Index      RIGHT ATRIUM           Index LA diam:      2.80 cm 1.52 cm/m RA Area:     14.30 cm LA Vol (A4C): 10.5 ml 5.69 ml/m RA Volume:   42.70 ml  23.15 ml/m  AORTIC VALVE LVOT Vmax:   53.80 cm/s LVOT Vmean:  41.900 cm/s LVOT VTI:    0.080 m  AORTA Ao Root diam: 3.60 cm Ao Asc diam:  3.50 cm MITRAL VALVE               TRICUSPID VALVE MV Area (PHT): 3.27 cm    TR Peak grad:   28.5 mmHg MV Decel Time: 232 msec    TR Vmax:        267.00 cm/s MV E velocity: 21.94 cm/s MV A velocity: 41.30 cm/s  SHUNTS MV E/A ratio:  0.53        Systemic VTI:  0.08 m                            Systemic Diam: 2.10 cm Charlton Haws MD Electronically signed by Charlton Haws MD Signature Date/Time: 05/23/2020/10:46:38 AM    Final    CT RENAL STONE STUDY  Result Date: 05/21/2020 CLINICAL DATA:  69 year old male with low back pain, flank pain, decreased p.o. EXAM: CT ABDOMEN AND PELVIS WITHOUT CONTRAST TECHNIQUE: Multidetector CT imaging of the abdomen and pelvis was performed following the standard protocol without IV contrast. COMPARISON:  Noncontrast CT Abdomen and Pelvis 10/28/2008. Portable chest radiograph 04/19/2010. FINDINGS: Lower chest: Patchy, irregular peribronchial and peripheral scattered pulmonary opacity at both lung bases. This seems to be new  from last month. Underlying probable pulmonary hyperinflation. Some associated lung base bronchiectasis. No cavitating areas identified. No cardiomegaly, pericardial effusion or pleural effusion. Hepatobiliary: Negative noncontrast liver and gallbladder. Pancreas: Negative. Spleen: Negative. Adrenals/Urinary Tract: Bulky left nephrolithiasis measuring 13 mm at the renal pelvis. Additional left lower pole stone. No hydronephrosis. Negative noncontrast right kidney. No hydroureter. Unremarkable urinary bladder. Incidental pelvic phleboliths. Stomach/Bowel: Decompressed large bowel. There is long segment circumferential wall thickening in the right colon, up to the 12 mm series 4, image 49) with some areas of adjacent mesenteric stranding (coronal  image 43). The wall thickening gradually decreases through the transverse colon. No free air. No free fluid. The terminal ileum appear spared. Appendix seems to remain normal on coronal image 32. No dilated small bowel.  Decompressed stomach and duodenum. Vascular/Lymphatic: Normal caliber abdominal aorta. Mild calcified atherosclerosis. Vascular patency is not evaluated in the absence of IV contrast. No lymphadenopathy identified in the absence of contrast. Reproductive: Negative. Other: Previous lower abdominal ventral hernia repair with mesh. No pelvic free fluid. Musculoskeletal: Degenerative changes in the spine. No acute osseous abnormality identified. IMPRESSION: 1. In the abdomen there is circumferential bowel wall thickening and mesenteric stranding involving the right colon. Wall thickening gradually abates through the transverse colon. This is compatible with Acute Nonspecific Colitis. 2. But the lung bases are also abnormal with patchy and irregular peribronchial and peripheral pulmonary opacity suspicious for acute viral/atypical respiratory infection. No areas of cavitation to suggest septic emboli. No pleural effusion. 3. Bulky left nephrolithiasis, but no  obstructive uropathy. 4. Previous lower abdominal hernia repair with mesh. Electronically Signed   By: Odessa Fleming M.D.   On: 05/21/2020 02:46   VAS [place] LOWER EXTREMITY VENOUS (DVT)  Result Date: 05/24/2020  Lower Venous DVT Study Indications: Covid-19, pulmonary embolism.  Risk Factors: Confirmed PE. Anticoagulation: Heparin. Limitations: Body habitus, ventilation, did not perform compressions in thigh and popliteal fossa of the left lower extremity secondary to mobile thrombus, bandages and line. Comparison Study: No prior study Performing Technologist: Sherren Kerns RVS  Examination Guidelines: A complete evaluation includes B-mode imaging, spectral Doppler, color Doppler, and power Doppler as needed of all accessible portions of each vessel. Bilateral testing is considered an integral part of a complete examination. Limited examinations for reoccurring indications may be performed as noted. The reflux portion of the exam is performed with the patient in reverse Trendelenburg.  +---------+---------------+---------+-----------+----------+--------------+ RIGHT    CompressibilityPhasicitySpontaneityPropertiesThrombus Aging +---------+---------------+---------+-----------+----------+--------------+ CFV      Full           Yes      No                                  +---------+---------------+---------+-----------+----------+--------------+ SFJ      Full                                                        +---------+---------------+---------+-----------+----------+--------------+ FV Prox  Full                                                        +---------+---------------+---------+-----------+----------+--------------+ FV Mid   Full                                                        +---------+---------------+---------+-----------+----------+--------------+ FV DistalFull                                                         +---------+---------------+---------+-----------+----------+--------------+  PFV      Full                                                        +---------+---------------+---------+-----------+----------+--------------+ POP      Full           Yes      No                                  +---------+---------------+---------+-----------+----------+--------------+ PTV      Full                                                        +---------+---------------+---------+-----------+----------+--------------+ PERO     Full                                                        +---------+---------------+---------+-----------+----------+--------------+   +---------+---------------+---------+-----------+----------+-------------------+ LEFT     CompressibilityPhasicitySpontaneityPropertiesThrombus Aging      +---------+---------------+---------+-----------+----------+-------------------+ CFV                                                   not visualized                                                            secondary to                                                              bandage/line        +---------+---------------+---------+-----------+----------+-------------------+ SFJ                                                   not visualized                                                            secondary to  bandage/line        +---------+---------------+---------+-----------+----------+-------------------+ FV Prox  Full                                         patent by color     +---------+---------------+---------+-----------+----------+-------------------+ FV Mid                                                patent by color     +---------+---------------+---------+-----------+----------+-------------------+ FV Distal                                              patent by color     +---------+---------------+---------+-----------+----------+-------------------+ PFV                                                   patent by color     +---------+---------------+---------+-----------+----------+-------------------+ POP                                                   patent by color     +---------+---------------+---------+-----------+----------+-------------------+ PTV      Full                                                             +---------+---------------+---------+-----------+----------+-------------------+ PERO     Full                                                             +---------+---------------+---------+-----------+----------+-------------------+     Summary: RIGHT: - There is no evidence of deep vein thrombosis in the lower extremity.  vessels are dilated with significantly sluggish flow, throughout  LEFT: - Findings consistent with acute deep vein thrombosis involving the left femoral vein. - Acute, mobile thrombus noted in the proximal and mid femoral vein. Vessels are dilated with significantly sluggish flow, throughout.  *See table(s) above for measurements and observations. Electronically signed by Waverly Ferrari MD on 05/24/2020 at 6:21:22 PM.    Final

## 2020-06-06 DIAGNOSIS — U071 COVID-19: Secondary | ICD-10-CM | POA: Diagnosis not present

## 2020-06-06 LAB — CBC
HCT: 28.5 % — ABNORMAL LOW (ref 39.0–52.0)
Hemoglobin: 9.4 g/dL — ABNORMAL LOW (ref 13.0–17.0)
MCH: 28.7 pg (ref 26.0–34.0)
MCHC: 33 g/dL (ref 30.0–36.0)
MCV: 87.2 fL (ref 80.0–100.0)
Platelets: 430 10*3/uL — ABNORMAL HIGH (ref 150–400)
RBC: 3.27 MIL/uL — ABNORMAL LOW (ref 4.22–5.81)
RDW: 14.4 % (ref 11.5–15.5)
WBC: 8.2 10*3/uL (ref 4.0–10.5)
nRBC: 0 % (ref 0.0–0.2)

## 2020-06-06 LAB — COMPREHENSIVE METABOLIC PANEL
ALT: 100 U/L — ABNORMAL HIGH (ref 0–44)
AST: 88 U/L — ABNORMAL HIGH (ref 15–41)
Albumin: 1.5 g/dL — ABNORMAL LOW (ref 3.5–5.0)
Alkaline Phosphatase: 207 U/L — ABNORMAL HIGH (ref 38–126)
Anion gap: 11 (ref 5–15)
BUN: 6 mg/dL — ABNORMAL LOW (ref 8–23)
CO2: 22 mmol/L (ref 22–32)
Calcium: 8 mg/dL — ABNORMAL LOW (ref 8.9–10.3)
Chloride: 98 mmol/L (ref 98–111)
Creatinine, Ser: 0.92 mg/dL (ref 0.61–1.24)
GFR, Estimated: >60 mL/min (ref >60)
Glucose, Bld: 103 mg/dL — ABNORMAL HIGH (ref 70–99)
Potassium: 4.3 mmol/L (ref 3.5–5.1)
Sodium: 131 mmol/L — ABNORMAL LOW (ref 135–145)
Total Bilirubin: 1 mg/dL (ref 0.3–1.2)
Total Protein: 4.4 g/dL — ABNORMAL LOW (ref 6.5–8.1)

## 2020-06-06 LAB — GLUCOSE, CAPILLARY
Glucose-Capillary: 98 mg/dL (ref 70–99)
Glucose-Capillary: 99 mg/dL (ref 70–99)
Glucose-Capillary: 117 mg/dL — ABNORMAL HIGH (ref 70–99)
Glucose-Capillary: 118 mg/dL — ABNORMAL HIGH (ref 70–99)
Glucose-Capillary: 152 mg/dL — ABNORMAL HIGH (ref 70–99)
Glucose-Capillary: 140 mg/dL — ABNORMAL HIGH (ref 70–99)

## 2020-06-06 LAB — MAGNESIUM: Magnesium: 2 mg/dL (ref 1.7–2.4)

## 2020-06-06 NOTE — Progress Notes (Addendum)
PROGRESS NOTE                                                                                                                                                                                                             Patient Demographics:    Dwayne Barnes, is a 69 y.o. male, DOB - [birth year], KDT:[id number]  Outpatient Primary MD for the patient is [name], PA-C   Admit date - 05/20/2020   LOS - 16  Chief Complaint  Patient presents with  . Fatigue       Brief Narrative: Patient is a 69 y.o. male with no significant past medical history-presented to the ED on 11/17 with nausea/vomiting, weakness and back pain-further evaluation revealed COVID-19 infection and a large PE.  Patient was started on IV heparin infusion along with steroid/Remdesivir-on 11/19-patient had a cardiac arrest-was intubated during the code-total downtime of around 6 minutes before ROSC.  Patient was subsequently transferred to the ICU-unfortunately soon after arrival in the ICU-he had another cardiac arrest-and 4 rounds of CPR were provided with ROSC.  Patient was provided supportive care-he subsequently improved-self extubated on 11/23-he was [facility] on 11/25.  Further hospital course complicated by diarrhea secondary to C. difficile along with AKI and hyponatremia.See below for further details.   COVID-19 vaccinated status: Vaccinated  Significant Events: 11/17>> Admit to [facility] for PE/COVID-19 infection 11/19>> PEA cardiac arrest x 2-given TPA-intubated-transfer to ICU 11/23>> Self extubated 11/25>> transfer to [facility] 11/29>> worsening diarrhea-CT abdomen with progressive colitis  Significant studies: 11/18>>CT renal stone: non specific colitis, GGO's, left nephrolithiasis without obstructive uropathy. 11/18>>CTA chest: large PC in LLL, GGO's LLL. 11/20 echo>> EF 50-55%, RV systolic function severely  reduced. 11/21>> B/L extremity Doppler: acute DVTin left femoral vein, acute mobile thrombus in the proximal femoral vein. 11/23>>CT Head: unremarkable   11/29>> CT abdomen/pelvis>> progressive colitis  COVID-19 medications: Steroids: 11/17>>11/26 Remdesivir: 11/17>> 11/22  Antibiotics: Oral vancomycin: 11/30>>  Microbiology data: 11/18>> urine culture: Insignificant growth 11/30>> stool C. difficile: +ve 11/30>> GI pathogen panel: negative  Procedures: ETT>>11/19 - 11/23 IVC filter placement by IR>>11/21.  Consults: PCCM, IR  DVT prophylaxis: Therapeutic Lovenox>> Eliquis    Subjective:   Patient in bed, appears comfortable, denies any headache, no fever, no chest pain or pressure, no shortness of breath , no abdominal pain. No  focal weakness.   Assessment  & Plan :   Cardiac arrest due to large PE: S/p TPA-was on therapeutic Lovenox-we will transition to Eliquis today. PE likely provoked by COVID-19 infection.  PCCM MD discussed with Dr. Illa LevelBensimhon-no indication for Impella support.  Continue telemetry monitoring.  Echo as above.  Large PE with DVT: Continue therapeutic anticoagulation-he is s/p TPA on 11/19 when he had cardiac arrest.  Patient is s/p IVC filter given mobile thrombus seen on lower extremity Doppler.  Once oral intake was stabilized-he was transitioned to Eliquis.  Acute hypoxic respiratory failure: Due to PE/cardiac arrest/COVID-19 pneumonia-self extubated on 11/23-currently stable on room air.  Acute metabolic encephalopathy/ICU delirium/possible anoxic injury: Mental status continues to slowly improve-getting more fluent with responses.  CT head without acute changes.  Continue supportive care.  Minimize all sedating medications.    Dysphagia: In the setting of cardiac arrest-seen by SLP-required NG tube-subsequently seen by SLP-diet gradually upgraded to a dysphagia 3 diet.  C. difficile colitis: Diarrhea improving-leukocytosis/AKI all getting better.   Continue oral vancomycin.  AKI: Likely hemodynamically mediated-due to diarrhea-has resolved with IV fluids and improvement in diarrhea.  COVID-19 pneumonia: Completed a course of Remdesivir-an steroids.  Hypoxia has resolved.   Paroxysmal atrial fibrillation: appears to be back in sinus rhythm-on anticoagulation for PE.  Echo as above.    Normocytic anemia: Secondary to critical illness-stable for monitoring-no indication for PRBC transfusion.  Transaminitis: Likely secondary to COVID-19-downtrending.  Deconditioning/debility: Secondary to critical illness- PT/OT eval completed-recommendations are for SNF   Hard of hearing -supportive care.   GI prophylaxis: H2 blocker.  Condition - Extremely Guarded  Family Communication  :  Daughter [name]([name] [phone])  updated over the phone 06/05/20 by previous MD.  I called on 06/06/2020.    Code Status :  Full Code  Diet :  Diet Order            DIET DYS 3 Room service appropriate? No; Fluid consistency: Thin  Diet effective now                  Disposition Plan  :   Status is: Inpatient  Remains inpatient appropriate because:Inpatient level of care appropriate due to severity of illness   Dispo: The patient is from: Home              Anticipated d/c is to: SNF              Anticipated d/c date is: > 1 day              Patient currently is not medically stable to d/c.   Barriers to discharge: C Diff colitis-AKI-Hyponatremia/Hypokalemia  Antimicorbials  :    Anti-infectives (From admission, onward)   Start     Dose/Rate Route Frequency Ordered Stop   06/02/20 1000  vancomycin (VANCOCIN) 50 mg/mL oral solution 125 mg        125 mg Oral 4 times daily 06/02/20 0800 06/12/20 0959   05/22/20 1000  remdesivir 100 mg in sodium chloride 0.9 % 100 mL IVPB       "Followed by" Linked Group Details   100 mg 200 mL/hr over 30 Minutes Intravenous Daily 05/21/20 0857 05/25/20 1154   05/21/20 1030  remdesivir 200 mg in sodium  chloride 0.9% 250 mL IVPB  Status:  Discontinued       "Followed by" Linked Group Details   200 mg 580 mL/hr over 30 Minutes Intravenous Once 05/21/20 0857 05/27/20 0734  05/21/20 0330  metroNIDAZOLE (FLAGYL) tablet 500 mg        500 mg Oral  Once 05/21/20 0320 05/21/20 0411   05/20/20 2330  cefTRIAXone (ROCEPHIN) 2 g in sodium chloride 0.9 % 100 mL IVPB        2 g 200 mL/hr over 30 Minutes Intravenous  Once 05/20/20 2324 05/21/20 0331      Inpatient Medications  Scheduled Meds: . apixaban  10 mg Oral BID   Followed by  . [START ON 06/07/2020] apixaban  5 mg Oral BID  . Chlorhexidine Gluconate Cloth  6 each Topical Daily  . famotidine  20 mg Oral Daily  . feeding supplement  237 mL Oral TID BM  . insulin aspart  0-9 Units Subcutaneous Q4H  . sodium chloride flush  10-40 mL Intracatheter Q12H  . vancomycin  125 mg Oral QID   Continuous Infusions: . lactated ringers with kcl 50 mL/hr at 06/05/20 1547   PRN Meds:.acetaminophen (TYLENOL) oral liquid 160 mg/5 mL, [DISCONTINUED] ondansetron **OR** ondansetron (ZOFRAN) IV, sodium chloride flush   Time Spent in minutes  25   See all Orders from today for further details   Susa Raring M.D on 06/06/2020 at 11:57 AM  To page go to [email or web address] - use universal password  [facility] -  Office  [phone]    Objective:   Vitals:   06/05/20 1532 06/05/20 2027 06/06/20 0400 06/06/20 0758  BP: (!) 104/58 109/64 112/76 93/60  Pulse: 89 88 80 80  Resp: 20 19 18 19   Temp: 98.3 F (36.8 C) 98.2 F (36.8 C) 98 F (36.7 C) 99.1 F (37.3 C)  TempSrc: Oral Oral Oral Oral  SpO2: 99% 98% 95% 96%  Weight:   61.5 kg   Height:        Wt Readings from Last 3 Encounters:  06/06/20 61.5 kg  12/20/14 66 kg     Intake/Output Summary (Last 24 hours) at 06/06/2020 1157 Last data filed at 06/06/2020 0510 Gross per 24 hour  Intake 720 ml  Output 3900 ml  Net -3180 ml     Physical Exam  Awake Alert, No new F.N deficits,  Normal affect [place].AT,PERRAL Supple Neck,No JVD, No cervical lymphadenopathy appriciated.  Symmetrical Chest wall movement, Good air movement bilaterally, CTAB RRR,No Gallops, Rubs or new Murmurs, No Parasternal Heave +ve B.Sounds, Abd Soft, No tenderness, No organomegaly appriciated, No rebound - guarding or rigidity. No Cyanosis, Clubbing or edema, No new Rash or bruise    Data Review:    CBC Recent Labs  Lab 06/02/20 0134 06/03/20 0117 06/04/20 0010 06/05/20 0316 06/06/20 0520  WBC 19.5* 17.0* 14.5* 10.7* 8.2  HGB 9.2* 9.3* 9.2* 9.9* 9.4*  HCT 27.1* 26.7* 27.9* 30.0* 28.5*  PLT 213 250 305 387 430*  MCV 85.0 84.0 86.4 87.7 87.2  MCH 28.8 29.2 28.5 28.9 28.7  MCHC 33.9 34.8 33.0 33.0 33.0  RDW 14.1 14.0 14.3 14.5 14.4    Chemistries  Recent Labs  Lab 06/02/20 0134 06/02/20 1203 06/02/20 1837 06/03/20 0117 06/04/20 0010 06/05/20 0316 06/06/20 0520  NA 123*   < > 126* 128* 129* 131* 131*  K 2.9*   < > 3.2* 3.9 4.2 4.9 4.3  CL 92*   < > 95* 99 101 100 98  CO2 21*   < > 23 21* 21* 23 22  GLUCOSE 152*   < > 119* 105* 153* 97 103*  BUN 21   < > 13 12 11 8  6*  CREATININE 1.41*   < > 1.02 0.99 1.04 0.97 0.92  CALCIUM 7.3*   < > 7.6* 7.5* 7.5* 7.9* 8.0*  MG  --   --   --  1.7 1.8 1.8 2.0  AST 43*  --   --  46* 57* 49* 88*  ALT 53*  --   --  57* 57* 66* 100*  ALKPHOS 131*  --   --  129* 151* 154* 207*  BILITOT 1.3*  --   --  1.2 0.9 1.1 1.0   < > = values in this interval not displayed.   ------------------------------------------------------------------------------------------------------------------ No results for input(s): CHOL, HDL, LDLCALC, TRIG, CHOLHDL, LDLDIRECT in the last 72 hours.  Lab Results  Component Value Date   HGBA1C 5.5 05/22/2020   ------------------------------------------------------------------------------------------------------------------ No results for input(s): TSH, T4TOTAL, T3FREE, THYROIDAB in the last 72 hours.  Invalid input(s):  FREET3 ------------------------------------------------------------------------------------------------------------------ No results for input(s): VITAMINB12, FOLATE, FERRITIN, TIBC, IRON, RETICCTPCT in the last 72 hours.  Coagulation profile No results for input(s): INR, PROTIME in the last 168 hours.  No results for input(s): DDIMER in the last 72 hours.  Cardiac Enzymes No results for input(s): CKMB, TROPONINI, MYOGLOBIN in the last 168 hours.  Invalid input(s): CK ------------------------------------------------------------------------------------------------------------------    Component Value Date/Time   BNP 499.2 (H) 05/23/2020 1244    Micro Results Recent Results (from the past 240 hour(s))  C Difficile Quick Screen (NO PCR Reflex)     Status: Abnormal   Collection Time: 06/02/20  8:41 AM   Specimen: STOOL  Result Value Ref Range Status   C Diff antigen POSITIVE (A) NEGATIVE Final   C Diff toxin POSITIVE (A) NEGATIVE Final   C Diff interpretation Toxin producing C. difficile detected.  Final    Comment: CRITICAL RESULT CALLED TO, READ BACK BY AND VERIFIED WITH: Tenna Child RN 12:05 06/02/20 ([email or web address]) Performed at [facility], [address] [place], [place] [address]   Gastrointestinal Panel by PCR , Stool     Status: None   Collection Time: 06/02/20  9:41 AM   Specimen: STOOL  Result Value Ref Range Status   Campylobacter species NOT DETECTED NOT DETECTED Final   Plesimonas shigelloides NOT DETECTED NOT DETECTED Final   Salmonella species NOT DETECTED NOT DETECTED Final   Yersinia enterocolitica NOT DETECTED NOT DETECTED Final   Vibrio species NOT DETECTED NOT DETECTED Final   Vibrio cholerae NOT DETECTED NOT DETECTED Final   Enteroaggregative E coli (EAEC) NOT DETECTED NOT DETECTED Final   Enteropathogenic E coli (EPEC) NOT DETECTED NOT DETECTED Final   Enterotoxigenic E coli (ETEC) NOT DETECTED NOT DETECTED Final   Shiga like toxin producing E coli  (STEC) NOT DETECTED NOT DETECTED Final   Shigella/Enteroinvasive E coli (EIEC) NOT DETECTED NOT DETECTED Final   Cryptosporidium NOT DETECTED NOT DETECTED Final   Cyclospora cayetanensis NOT DETECTED NOT DETECTED Final   Entamoeba histolytica NOT DETECTED NOT DETECTED Final   Giardia lamblia NOT DETECTED NOT DETECTED Final   Adenovirus F40/41 NOT DETECTED NOT DETECTED Final   Astrovirus NOT DETECTED NOT DETECTED Final   Norovirus GI/GII NOT DETECTED NOT DETECTED Final   Rotavirus A NOT DETECTED NOT DETECTED Final   Sapovirus (I, II, IV, and V) NOT DETECTED NOT DETECTED Final    Comment: Performed at [facility], [address], [place], [place] [address]    Radiology Reports CT ABDOMEN PELVIS WO CONTRAST  Result Date: 06/01/2020 CLINICAL DATA:  Left upper quadrant diarrhea, history of  COVID-19 pneumonia. EXAM: CT ABDOMEN AND PELVIS WITHOUT CONTRAST TECHNIQUE: Multidetector CT imaging of the abdomen and pelvis was performed following the standard protocol without IV contrast. COMPARISON:  05/21/2020 FINDINGS: Lower chest: Bibasilar consolidation is noted increased when compared with the prior exam. These changes are worse on the left than the right. Stable changes of prior COVID-19 pneumonia are noted. No sizable effusion is seen. Hepatobiliary: No focal liver abnormality is seen. No gallstones, gallbladder wall thickening, or biliary dilatation. Pancreas: Unremarkable. No pancreatic ductal dilatation or surrounding inflammatory changes. Spleen: Normal in size without focal abnormality. Adrenals/Urinary Tract: Adrenal glands are within normal limits. Kidneys demonstrate left renal pelvis stone stable in appearance. No obstructive changes are seen. The bladder is well distended. Stomach/Bowel: Diffuse edematous changes of the colonic wall are seen throughout its course increased when compared with the prior exam consistent with a generalized colitis. Some pericolonic inflammatory changes  noted particularly on the right increased from the prior exam. The appendix appears within normal limits. Small bowel and stomach are unremarkable. Vascular/Lymphatic: Atherosclerotic calcifications of the aorta are noted. IVC filter is noted in place. No sizable lymphadenopathy is noted. Reproductive: Prostate is unremarkable. Other: No abdominal wall hernia or abnormality. No abdominopelvic ascites. Changes of prior hernia repair are noted in the lower abdomen bilaterally Musculoskeletal: Degenerative changes of lumbar spine are noted. IMPRESSION: Increasing colonic wall thickness with pericolonic inflammatory change consistent with progressive colitis. Increasing bilateral lower lobe consolidation without sizable effusion. Some scattered changes consistent with the COVID-19 pneumonia are noted. Stable nonobstructing left renal stone. Electronically Signed   By: Alcide Clever M.D.   On: 06/01/2020 23:59   CT HEAD WO CONTRAST  Result Date: 05/26/2020 CLINICAL DATA:  69 year old male with altered mental status. EXAM: CT HEAD WITHOUT CONTRAST TECHNIQUE: Contiguous axial images were obtained from the base of the skull through the vertex without intravenous contrast. COMPARISON:  Head CT dated 04/18/2010. FINDINGS: Brain: The ventricles and sulci appropriate size for patient's age. The gray-white matter discrimination is preserved. There is no acute intracranial hemorrhage. No mass effect midline shift. No extra-axial fluid collection. Vascular: No hyperdense vessel or unexpected calcification. Skull: Normal. Negative for fracture or focal lesion. Sinuses/Orbits: No acute finding. Other: None IMPRESSION: Unremarkable noncontrast CT of the brain. Electronically Signed   By: Elgie Collard M.D.   On: 05/26/2020 15:45   CT ANGIO CHEST PE W OR WO CONTRAST  Addendum Date: 05/21/2020   ADDENDUM REPORT: 05/21/2020 19:28 ADDENDUM: Critical Value/emergent results were called by telephone at the time of interpretation  on 05/21/2020 at 7:27 pm to provider Dr. Dierdre Highman, who verbally acknowledged these results. Of note the airspace disease in the LEFT lower lobe is progressed from CT same day. Differential remains the same. Electronically Signed   By: Genevive Bi M.D.   On: 05/21/2020 19:28   Result Date: 05/21/2020 CLINICAL DATA:  PE suspected, high probability.  COVID-19 infection EXAM: CT ANGIOGRAPHY CHEST WITH CONTRAST TECHNIQUE: Multidetector CT imaging of the chest was performed using the standard protocol during bolus administration of intravenous contrast. Multiplanar CT image reconstructions and MIPs were obtained to evaluate the vascular anatomy. CONTRAST:  75mL OMNIPAQUE IOHEXOL 300 MG/ML  SOLN COMPARISON:  None. FINDINGS: Cardiovascular: Large filling defect within the proximal LEFT lower lobe pulmonary artery. Filling defect spans the bifurcation of the LEFT lower lobe and lingular branch pulmonary branches. This this filling defect/thrombus is occlusive to the LEFT lower lobe. No LEFT upper lobe filling defects identified. No filling defect identified  within the RIGHT lung pulmonary arteries. No evidence of RIGHT ventricular strain with the RV to LV ratio less than 1. Coronary artery calcification and aortic atherosclerotic calcification. Mediastinum/Nodes: No axillary or supraclavicular adenopathy. No mediastinal or hilar adenopathy. No pericardial fluid. Esophagus normal. Lungs/Pleura: There is diffuse very fine airspace disease within the LEFT lower lobe involving the majority of the LEFT lower lobe. Similar milder findings in the posterior RIGHT lower lobe and lingula. Upper Abdomen: Limited view of the liver, kidneys, pancreas are unremarkable. Normal adrenal glands. Musculoskeletal: No aggressive osseous lesion. Review of the MIP images confirms the above findings. IMPRESSION: 1. Large occlusive pulmonary embolism within the proximal LEFT lower lobe pulmonary artery and lingular pulmonary artery. 2. Diffuse  fine airspace disease within the LEFT lower lobe with differential including pulmonary infarction, pulmonary hemorrhage, or pneumonia (including COVID pneumonia). 3. No evidence of RIGHT ventricular strain. 4. Coronary artery calcification and aortic atherosclerotic calcification. Critical Value/emergent results were called by telephone at the time of interpretation on 05/21/2020 at 6:55 pm to provider [facility] , who verbally acknowledged these results. Electronically Signed: By: Genevive Bi M.D. On: 05/21/2020 18:59   IR IVC FILTER PLMT / S&I Lenise Arena GUID/MOD SED  Result Date: 05/24/2020 INDICATION: 69 year old with recent cardiac arrest likely secondary to pulmonary embolism and COVID-19. Subsequent right ventricular failure. Patient has clot in the left pulmonary arteries and clot in left femoral vein. There is concern that the patient would not tolerate additional clot burden in the pulmonary arteries and request for IVC filter placement. EXAM: IVC FILTER PLACEMENT; IVC VENOGRAM; ULTRASOUND FOR VASCULAR ACCESS Physician: Rachelle Hora. Lowella Dandy, MD MEDICATIONS: None. ANESTHESIA/SEDATION: Versed 1 mg The patient was continuously monitored during the procedure by the interventional radiology nurse under my direct supervision. CONTRAST:  Carbon dioxide FLUOROSCOPY TIME:  Fluoroscopy Time: 3 minutes, 18 seconds, 18 mGy COMPLICATIONS: None immediate. PROCEDURE: Informed consent was obtained for an IVC filter placement from the patient's daughter. Ultrasound demonstrated a patent right internal jugular vein. Ultrasound images were obtained for documentation. The right neck was prepped and draped in a sterile fashion. Maximal barrier sterile technique was utilized including caps, mask, sterile gowns, sterile gloves, sterile drape, hand hygiene and skin antiseptic. The skin was anesthetized with 1% lidocaine. A 21 gauge needle was directed into the vein with ultrasound guidance and a micropuncture dilator set was  placed. A wire was advanced into the IVC. The filter sheath was advanced over the wire into the IVC. An IVC venogram was performed with carbon dioxide. Bilateral renal veins were cannulated using a 5 French catheter and Bentson wire. Bilateral renal veins are identified. Fluoroscopic images were obtained for documentation. A Bard Denali filter was deployed below the lowest renal vein. Post placement images of the filter were obtained. The vascular sheath was removed with manual compression. Bandage placed over the puncture site. FINDINGS: IVC was patent. Bilateral renal veins were identified. The filter was deployed below the lowest renal vein. IMPRESSION: Successful placement of a retrievable IVC filter. PLAN: This IVC filter is potentially retrievable. The patient will be assessed for filter retrieval by Interventional Radiology in approximately 8-12 weeks. Further recommendations regarding filter retrieval, continued surveillance or declaration of device permanence, will be made at that time. Electronically Signed   By: Richarda Overlie M.D.   On: 05/24/2020 17:57   DG CHEST PORT 1 VIEW  Result Date: 05/26/2020 CLINICAL DATA:  Acute respiratory failure.  Hypoxia. EXAM: PORTABLE CHEST 1 VIEW COMPARISON:  05/22/2020. FINDINGS: Interim extubation  and removal of NG tube. Left IJ line in stable position. Heart size normal. Diffuse left lung infiltrate. Right base infiltrate. No prominent pleural effusion. No pneumothorax. IMPRESSION: 1. Interim extubation and removal of NG tube. Left IJ line stable position. 2. Diffuse left lung infiltrate and right base infiltrate. Electronically Signed   By: Maisie Fus  Register   On: 05/26/2020 05:50   DG CHEST PORT 1 VIEW  Result Date: 05/22/2020 CLINICAL DATA:  Central line placement, intubation EXAM: PORTABLE CHEST 1 VIEW COMPARISON:  05/21/2020 FINDINGS: Left central line has been placed with the tip in the SVC. No pneumothorax. Endotracheal tube is 5 cm above the carina.  Patchy airspace disease throughout the left lung. No focal opacity on the right. Heart is normal size. IMPRESSION: Endotracheal tube 5 cm above the carina. Left central line tip in the SVC. No pneumothorax. Stable patchy left lung airspace disease. Electronically Signed   By: Charlett Nose M.D.   On: 05/22/2020 21:06   DG Chest Port 1 View  Result Date: 05/21/2020 CLINICAL DATA:  Cough, lower back pain, has not eaten 5 days, emesis EXAM: PORTABLE CHEST 1 VIEW COMPARISON:  Radiograph 04/19/2010, CT 04/18/2010 FINDINGS: Heterogeneous opacities present predominantly along the periphery of the left lung and minimally in the right lung base as well. No pneumothorax or effusion. The aorta is calcified. The remaining cardiomediastinal contours are unremarkable. No acute osseous or soft tissue abnormality. Degenerative changes are present in the imaged spine and shoulders. Telemetry leads overlie the chest. IMPRESSION: Heterogeneous opacities throughout the lungs, greatest in the left lung periphery concerning for pneumonia including potential viral etiology. Aortic Atherosclerosis (ICD10-I70.0). Electronically Signed   By: Kreg Shropshire M.D.   On: 05/21/2020 03:50   DG Abd Portable 1V  Result Date: 05/23/2020 CLINICAL DATA:  Encounter for orogastric tube placement. EXAM: PORTABLE ABDOMEN - 1 VIEW COMPARISON:  06/09/2009 and chest radiograph 05/22/2020 FINDINGS: Gastric tube extends into the abdomen and the tip is in the midline of the lower abdomen, likely in the distal stomach body region. Again noted are patchy densities at the left lung base. Few gas-filled loops of bowel in the abdomen. IMPRESSION: Orogastric tube in the lower abdominal midline and likely in the distal stomach body region. Electronically Signed   By: Richarda Overlie M.D.   On: 05/23/2020 12:17   ECHOCARDIOGRAM COMPLETE  Result Date: 05/23/2020    ECHOCARDIOGRAM REPORT   Patient Name:   ELIJAH PHOMMACHANH Date of Exam: 05/23/2020 Medical Rec #:   [id number]      Height:       72.0 in Accession #:    [id number]     Weight:       142.4 lb Date of Birth:  10/13/1950      BSA:          1.844 m Patient Age:    69 years       BP:           112/85 mmHg Patient Gender: M              HR:           96 bpm. Exam Location:  Inpatient Procedure: 2D Echo, Cardiac Doppler and Color Doppler STAT ECHO Indications:    I26.02 Pulmonary embolus  History:        Patient has no prior history of Echocardiogram examinations.                 Arrythmias:Cardiac Arrest; Signs/Symptoms:Dyspnea, Hypotension  and Altered Mental Status. Covid 19 positive.  Sonographer:    Sheralyn Boatman RDCS Referring Phys: [id number] Martina Sinner  Sonographer Comments: Technically difficult study due to poor echo windows and echo performed with patient supine and on artificial respirator. IMPRESSIONS  1. Severe RV failure and cor pulmonale.  2. Abnormal septal motion and septal flattening consistent with RV pressure overload. Left ventricular ejection fraction, by estimation, is 50 to 55%. The left ventricle has low normal function. The left ventricle has no regional wall motion abnormalities. Left ventricular diastolic parameters were normal.  3. Right ventricular systolic function is severely reduced. The right ventricular size is severely enlarged. There is mildly elevated pulmonary artery systolic pressure.  4. The mitral valve is normal in structure. No evidence of mitral valve regurgitation. No evidence of mitral stenosis.  5. The aortic valve is normal in structure. Aortic valve regurgitation is not visualized. No aortic stenosis is present.  6. The inferior vena cava is dilated in size with <50% respiratory variability, suggesting right atrial pressure of 15 mmHg. FINDINGS  Left Ventricle: Abnormal septal motion and septal flattening consistent with RV pressure overload. Left ventricular ejection fraction, by estimation, is 50 to 55%. The left ventricle has low normal function. The  left ventricle has no regional wall motion abnormalities. The left ventricular internal cavity size was normal in size. There is no left ventricular hypertrophy. Left ventricular diastolic parameters were normal. Right Ventricle: The right ventricular size is severely enlarged. Right vetricular wall thickness was not assessed. Right ventricular systolic function is severely reduced. There is mildly elevated pulmonary artery systolic pressure. The tricuspid regurgitant velocity is 2.67 m/s, and with an assumed right atrial pressure of 8 mmHg, the estimated right ventricular systolic pressure is 36.5 mmHg. Left Atrium: Left atrial size was normal in size. Right Atrium: Right atrial size was normal in size. Pericardium: There is no evidence of pericardial effusion. Mitral Valve: The mitral valve is normal in structure. No evidence of mitral valve regurgitation. No evidence of mitral valve stenosis. Tricuspid Valve: The tricuspid valve is normal in structure. Tricuspid valve regurgitation is mild . No evidence of tricuspid stenosis. Aortic Valve: The aortic valve is normal in structure. Aortic valve regurgitation is not visualized. No aortic stenosis is present. Pulmonic Valve: The pulmonic valve was normal in structure. Pulmonic valve regurgitation is not visualized. No evidence of pulmonic stenosis. Aorta: The aortic root is normal in size and structure. Venous: The inferior vena cava is dilated in size with less than 50% respiratory variability, suggesting right atrial pressure of 15 mmHg. IAS/Shunts: No atrial level shunt detected by color flow Doppler. Additional Comments: Severe RV failure and cor pulmonale.  LEFT VENTRICLE PLAX 2D LVIDd:         3.70 cm     Diastology LVIDs:         2.80 cm     LV e' medial:    4.46 cm/s LV PW:         1.10 cm     LV E/e' medial:  4.9 LV IVS:        1.10 cm     LV e' lateral:   6.97 cm/s LVOT diam:     2.10 cm     LV E/e' lateral: 3.1 LV SV:         28 LV SV Index:   15 LVOT  Area:     3.46 cm  LV Volumes (MOD) LV vol d, MOD A2C: 22.6 ml LV  vol d, MOD A4C: 25.5 ml LV vol s, MOD A2C: 11.1 ml LV vol s, MOD A4C: 11.7 ml LV SV MOD A2C:     11.5 ml LV SV MOD A4C:     25.5 ml LV SV MOD BP:      13.7 ml RIGHT VENTRICLE            IVC RV S prime:     8.84 cm/s  IVC diam: 2.50 cm TAPSE (M-mode): 1.3 cm LEFT ATRIUM           Index      RIGHT ATRIUM           Index LA diam:      2.80 cm 1.52 cm/m RA Area:     14.30 cm LA Vol (A4C): 10.5 ml 5.69 ml/m RA Volume:   42.70 ml  23.15 ml/m  AORTIC VALVE LVOT Vmax:   53.80 cm/s LVOT Vmean:  41.900 cm/s LVOT VTI:    0.080 m  AORTA Ao Root diam: 3.60 cm Ao Asc diam:  3.50 cm MITRAL VALVE               TRICUSPID VALVE MV Area (PHT): 3.27 cm    TR Peak grad:   28.5 mmHg MV Decel Time: 232 msec    TR Vmax:        267.00 cm/s MV E velocity: 21.94 cm/s MV A velocity: 41.30 cm/s  SHUNTS MV E/A ratio:  0.53        Systemic VTI:  0.08 m                            Systemic Diam: 2.10 cm Charlton Haws MD Electronically signed by Charlton Haws MD Signature Date/Time: 05/23/2020/10:46:38 AM    Final    CT RENAL STONE STUDY  Result Date: 05/21/2020 CLINICAL DATA:  69 year old male with low back pain, flank pain, decreased p.o. EXAM: CT ABDOMEN AND PELVIS WITHOUT CONTRAST TECHNIQUE: Multidetector CT imaging of the abdomen and pelvis was performed following the standard protocol without IV contrast. COMPARISON:  Noncontrast CT Abdomen and Pelvis 10/28/2008. Portable chest radiograph 04/19/2010. FINDINGS: Lower chest: Patchy, irregular peribronchial and peripheral scattered pulmonary opacity at both lung bases. This seems to be new from last month. Underlying probable pulmonary hyperinflation. Some associated lung base bronchiectasis. No cavitating areas identified. No cardiomegaly, pericardial effusion or pleural effusion. Hepatobiliary: Negative noncontrast liver and gallbladder. Pancreas: Negative. Spleen: Negative. Adrenals/Urinary Tract: Bulky left  nephrolithiasis measuring 13 mm at the renal pelvis. Additional left lower pole stone. No hydronephrosis. Negative noncontrast right kidney. No hydroureter. Unremarkable urinary bladder. Incidental pelvic phleboliths. Stomach/Bowel: Decompressed large bowel. There is long segment circumferential wall thickening in the right colon, up to the 12 mm series 4, image 49) with some areas of adjacent mesenteric stranding (coronal image 43). The wall thickening gradually decreases through the transverse colon. No free air. No free fluid. The terminal ileum appear spared. Appendix seems to remain normal on coronal image 32. No dilated small bowel.  Decompressed stomach and duodenum. Vascular/Lymphatic: Normal caliber abdominal aorta. Mild calcified atherosclerosis. Vascular patency is not evaluated in the absence of IV contrast. No lymphadenopathy identified in the absence of contrast. Reproductive: Negative. Other: Previous lower abdominal ventral hernia repair with mesh. No pelvic free fluid. Musculoskeletal: Degenerative changes in the spine. No acute osseous abnormality identified. IMPRESSION: 1. In the abdomen there is circumferential bowel wall thickening and mesenteric stranding involving the right  colon. Wall thickening gradually abates through the transverse colon. This is compatible with Acute Nonspecific Colitis. 2. But the lung bases are also abnormal with patchy and irregular peribronchial and peripheral pulmonary opacity suspicious for acute viral/atypical respiratory infection. No areas of cavitation to suggest septic emboli. No pleural effusion. 3. Bulky left nephrolithiasis, but no obstructive uropathy. 4. Previous lower abdominal hernia repair with mesh. Electronically Signed   By: Odessa Fleming M.D.   On: 05/21/2020 02:46   VAS [place] LOWER EXTREMITY VENOUS (DVT)  Result Date: 05/24/2020  Lower Venous DVT Study Indications: Covid-19, pulmonary embolism.  Risk Factors: Confirmed PE. Anticoagulation: Heparin.  Limitations: Body habitus, ventilation, did not perform compressions in thigh and popliteal fossa of the left lower extremity secondary to mobile thrombus, bandages and line. Comparison Study: No prior study Performing Technologist: Sherren Kerns RVS  Examination Guidelines: A complete evaluation includes B-mode imaging, spectral Doppler, color Doppler, and power Doppler as needed of all accessible portions of each vessel. Bilateral testing is considered an integral part of a complete examination. Limited examinations for reoccurring indications may be performed as noted. The reflux portion of the exam is performed with the patient in reverse Trendelenburg.  +---------+---------------+---------+-----------+----------+--------------+ RIGHT    CompressibilityPhasicitySpontaneityPropertiesThrombus Aging +---------+---------------+---------+-----------+----------+--------------+ CFV      Full           Yes      No                                  +---------+---------------+---------+-----------+----------+--------------+ SFJ      Full                                                        +---------+---------------+---------+-----------+----------+--------------+ FV Prox  Full                                                        +---------+---------------+---------+-----------+----------+--------------+ FV Mid   Full                                                        +---------+---------------+---------+-----------+----------+--------------+ FV DistalFull                                                        +---------+---------------+---------+-----------+----------+--------------+ PFV      Full                                                        +---------+---------------+---------+-----------+----------+--------------+ POP      Full           Yes      No                                   +---------+---------------+---------+-----------+----------+--------------+  PTV      Full                                                        +---------+---------------+---------+-----------+----------+--------------+ PERO     Full                                                        +---------+---------------+---------+-----------+----------+--------------+   +---------+---------------+---------+-----------+----------+-------------------+ LEFT     CompressibilityPhasicitySpontaneityPropertiesThrombus Aging      +---------+---------------+---------+-----------+----------+-------------------+ CFV                                                   not visualized                                                            secondary to                                                              bandage/line        +---------+---------------+---------+-----------+----------+-------------------+ SFJ                                                   not visualized                                                            secondary to                                                              bandage/line        +---------+---------------+---------+-----------+----------+-------------------+ FV Prox  Full                                         patent by color     +---------+---------------+---------+-----------+----------+-------------------+ FV Mid  patent by color     +---------+---------------+---------+-----------+----------+-------------------+ FV Distal                                             patent by color     +---------+---------------+---------+-----------+----------+-------------------+ PFV                                                   patent by color     +---------+---------------+---------+-----------+----------+-------------------+ POP                                                    patent by color     +---------+---------------+---------+-----------+----------+-------------------+ PTV      Full                                                             +---------+---------------+---------+-----------+----------+-------------------+ PERO     Full                                                             +---------+---------------+---------+-----------+----------+-------------------+     Summary: RIGHT: - There is no evidence of deep vein thrombosis in the lower extremity.  vessels are dilated with significantly sluggish flow, throughout  LEFT: - Findings consistent with acute deep vein thrombosis involving the left femoral vein. - Acute, mobile thrombus noted in the proximal and mid femoral vein. Vessels are dilated with significantly sluggish flow, throughout.  *See table(s) above for measurements and observations. Electronically signed by Waverly Ferrarihristopher Dickson MD on 05/24/2020 at 6:21:22 PM.    Final

## 2020-06-06 NOTE — Discharge Instructions (Addendum)
 COVID-19 COVID-19 is a respiratory infection that is caused by a virus called severe acute respiratory syndrome coronavirus 2 (SARS-CoV-2). The disease is also known as coronavirus disease or novel coronavirus. In some people, the virus may not cause any symptoms. In others, it may cause a serious infection. The infection can get worse quickly and can lead to complications, such as:  Pneumonia, or infection of the lungs.  Acute respiratory distress syndrome or ARDS. This is a condition in which fluid build-up in the lungs prevents the lungs from filling with air and passing oxygen into the blood.  Acute respiratory failure. This is a condition in which there is not enough oxygen passing from the lungs to the body or when carbon dioxide is not passing from the lungs out of the body.  Sepsis or septic shock. This is a serious bodily reaction to an infection.  Blood clotting problems.  Secondary infections due to bacteria or fungus.  Organ failure. This is when your body's organs stop working. The virus that causes COVID-19 is contagious. This means that it can spread from person to person through droplets from coughs and sneezes (respiratory secretions). What are the causes? This illness is caused by a virus. You may catch the virus by:  Breathing in droplets from an infected person. Droplets can be spread by a person breathing, speaking, singing, coughing, or sneezing.  Touching something, like a table or a doorknob, that was exposed to the virus (contaminated) and then touching your mouth, nose, or eyes. What increases the risk? Risk for infection You are more likely to be infected with this virus if you:  Are within 6 feet (2 meters) of a person with COVID-19.  Provide care for or live with a person who is infected with COVID-19.  Spend time in crowded indoor spaces or live in shared housing. Risk for serious illness You are more likely to become seriously ill from the virus if  you:  Are 50 years of age or older. The higher your age, the more you are at risk for serious illness.  Live in a nursing home or long-term care facility.  Have cancer.  Have a long-term (chronic) disease such as: ? Chronic lung disease, including chronic obstructive pulmonary disease or asthma. ? A long-term disease that lowers your body's ability to fight infection (immunocompromised). ? Heart disease, including heart failure, a condition in which the arteries that lead to the heart become narrow or blocked (coronary artery disease), a disease which makes the heart muscle thick, weak, or stiff (cardiomyopathy). ? Diabetes. ? Chronic kidney disease. ? Sickle cell disease, a condition in which red blood cells have an abnormal "sickle" shape. ? Liver disease.  Are obese. What are the signs or symptoms? Symptoms of this condition can range from mild to severe. Symptoms may appear any time from 2 to 14 days after being exposed to the virus. They include:  A fever or chills.  A cough.  Difficulty breathing.  Headaches, body aches, or muscle aches.  Runny or stuffy (congested) nose.  A sore throat.  New loss of taste or smell. Some people may also have stomach problems, such as nausea, vomiting, or diarrhea. Other people may not have any symptoms of COVID-19. How is this diagnosed? This condition may be diagnosed based on:  Your signs and symptoms, especially if: ? You live in an area with a COVID-19 outbreak. ? You recently traveled to or from an area where the virus is common. ?   You provide care for or live with a person who was diagnosed with COVID-19. ? You were exposed to a person who was diagnosed with COVID-19.  A physical exam.  Lab tests, which may include: ? Taking a sample of fluid from the back of your nose and throat (nasopharyngeal fluid), your nose, or your throat using a swab. ? A sample of mucus from your lungs (sputum). ? Blood tests.  Imaging tests,  which may include, X-rays, CT scan, or ultrasound. How is this treated? At present, there is no medicine to treat COVID-19. Medicines that treat other diseases are being used on a trial basis to see if they are effective against COVID-19. Your health care provider will talk with you about ways to treat your symptoms. For most people, the infection is mild and can be managed at home with rest, fluids, and over-the-counter medicines. Treatment for a serious infection usually takes places in a hospital intensive care unit (ICU). It may include one or more of the following treatments. These treatments are given until your symptoms improve.  Receiving fluids and medicines through an IV.  Supplemental oxygen. Extra oxygen is given through a tube in the nose, a face mask, or a hood.  Positioning you to lie on your stomach (prone position). This makes it easier for oxygen to get into the lungs.  Continuous positive airway pressure (CPAP) or bi-level positive airway pressure (BPAP) machine. This treatment uses mild air pressure to keep the airways open. A tube that is connected to a motor delivers oxygen to the body.  Ventilator. This treatment moves air into and out of the lungs by using a tube that is placed in your windpipe.  Tracheostomy. This is a procedure to create a hole in the neck so that a breathing tube can be inserted.  Extracorporeal membrane oxygenation (ECMO). This procedure gives the lungs a chance to recover by taking over the functions of the heart and lungs. It supplies oxygen to the body and removes carbon dioxide. Follow these instructions at home: Lifestyle  If you are sick, stay home except to get medical care. Your health care provider will tell you how long to stay home. Call your health care provider before you go for medical care.  Rest at home as told by your health care provider.  Do not use any products that contain nicotine or tobacco, such as cigarettes,  e-cigarettes, and chewing tobacco. If you need help quitting, ask your health care provider.  Return to your normal activities as told by your health care provider. Ask your health care provider what activities are safe for you. General instructions  Take over-the-counter and prescription medicines only as told by your health care provider.  Drink enough fluid to keep your urine pale yellow.  Keep all follow-up visits as told by your health care provider. This is important. How is this prevented?  There is no vaccine to help prevent COVID-19 infection. However, there are steps you can take to protect yourself and others from this virus. To protect yourself:   Do not travel to areas where COVID-19 is a risk. The areas where COVID-19 is reported change often. To identify high-risk areas and travel restrictions, check the CDC travel website: wwwnc.cdc.gov/travel/notices  If you live in, or must travel to, an area where COVID-19 is a risk, take precautions to avoid infection. ? Stay away from people who are sick. ? Wash your hands often with soap and water for 20 seconds. If soap and   water are not available, use an alcohol-based hand sanitizer. ? Avoid touching your mouth, face, eyes, or nose. ? Avoid going out in public, follow guidance from your state and local health authorities. ? If you must go out in public, wear a cloth face covering or face mask. Make sure your mask covers your nose and mouth. ? Avoid crowded indoor spaces. Stay at least 6 feet (2 meters) away from others. ? Disinfect objects and surfaces that are frequently touched every day. This may include:  Counters and tables.  Doorknobs and light switches.  Sinks and faucets.  Electronics, such as phones, remote controls, keyboards, computers, and tablets. To protect others: If you have symptoms of COVID-19, take steps to prevent the virus from spreading to others.  If you think you have a COVID-19 infection, contact  your health care provider right away. Tell your health care team that you think you may have a COVID-19 infection.  Stay home. Leave your house only to seek medical care. Do not use public transport.  Do not travel while you are sick.  Wash your hands often with soap and water for 20 seconds. If soap and water are not available, use alcohol-based hand sanitizer.  Stay away from other members of your household. Let healthy household members care for children and pets, if possible. If you have to care for children or pets, wash your hands often and wear a mask. If possible, stay in your own room, separate from others. Use a different bathroom.  Make sure that all people in your household wash their hands well and often.  Cough or sneeze into a tissue or your sleeve or elbow. Do not cough or sneeze into your hand or into the air.  Wear a cloth face covering or face mask. Make sure your mask covers your nose and mouth. Where to find more information  Centers for Disease Control and Prevention: www.cdc.gov/coronavirus/2019-ncov/index.html  World Health Organization: www.who.int/health-topics/coronavirus Contact a health care provider if:  You live in or have traveled to an area where COVID-19 is a risk and you have symptoms of the infection.  You have had contact with someone who has COVID-19 and you have symptoms of the infection. Get help right away if:  You have trouble breathing.  You have pain or pressure in your chest.  You have confusion.  You have bluish lips and fingernails.  You have difficulty waking from sleep.  You have symptoms that get worse. These symptoms may represent a serious problem that is an emergency. Do not wait to see if the symptoms will go away. Get medical help right away. Call your local emergency services (911 in the [place] Do not drive yourself to the hospital. Let the emergency medical personnel know if you think you have  COVID-19. Summary  COVID-19 is a respiratory infection that is caused by a virus. It is also known as coronavirus disease or novel coronavirus. It can cause serious infections, such as pneumonia, acute respiratory distress syndrome, acute respiratory failure, or sepsis.  The virus that causes COVID-19 is contagious. This means that it can spread from person to person through droplets from breathing, speaking, singing, coughing, or sneezing.  You are more likely to develop a serious illness if you are 50 years of age or older, have a weak immune system, live in a nursing home, or have chronic disease.  There is no medicine to treat COVID-19. Your health care provider will talk with you about ways to treat your   symptoms.  Take steps to protect yourself and others from infection. Wash your hands often and disinfect objects and surfaces that are frequently touched every day. Stay away from people who are sick and wear a mask if you are sick. This information is not intended to replace advice given to you by your health care provider. Make sure you discuss any questions you have with your health care provider. Document Revised: 04/19/2019 Document Reviewed: 07/26/2018 Elsevier Patient Education  2020 [employer] Follow with Primary MD [name], PA-C in 7 days   Get CBC, CMP, 2 view Chest X ray -  checked next visit within 1 week by Primary MD or SNF MD    Activity: As tolerated with Full fall precautions use walker/cane & assistance as needed  Disposition SNF  Diet: Dysphagia 3 diet with feeding assistance and aspiration precautions.   Special Instructions: If you have smoked or chewed Tobacco  in the last 2 yrs please stop smoking, stop any regular Alcohol  and or any Recreational drug use.  On your next visit with your primary care physician please Get Medicines reviewed and adjusted.  Please request your Prim.MD to go over all Hospital Tests and Procedure/Radiological results at the  follow up, please get all Hospital records sent to your Prim MD by signing hospital release before you go home.  If you experience worsening of your admission symptoms, develop shortness of breath, life threatening emergency, suicidal or homicidal thoughts you must seek medical attention immediately by calling 911 or calling your MD immediately  if symptoms less severe.  You Must read complete instructions/literature along with all the possible adverse reactions/side effects for all the Medicines you take and that have been prescribed to you. Take any new Medicines after you have completely understood and accpet all the possible adverse reactions/side effects.      Information on my medicine - ELIQUIS (apixaban)  This medication education was reviewed with me or my healthcare representative as part of my discharge preparation.   Why was Eliquis prescribed for you? Eliquis was prescribed to treat blood clots that may have been found in the veins of your legs (deep vein thrombosis) or in your lungs (pulmonary embolism) and to reduce the risk of them occurring again.  What do You need to know about Eliquis ? The starting dose is ONE 5 mg tablet taken TWICE daily.  Eliquis may be taken with or without food.   Try to take the dose about the same time in the morning and in the evening. If you have difficulty swallowing the tablet whole please discuss with your pharmacist how to take the medication safely.  Take Eliquis exactly as prescribed and DO NOT stop taking Eliquis without talking to the doctor who prescribed the medication.  Stopping may increase your risk of developing a new blood clot.  Refill your prescription before you run out.  After discharge, you should have regular check-up appointments with your healthcare provider that is prescribing your Eliquis.    What do you do if you miss a dose? If a dose of ELIQUIS is not taken at the scheduled time, take it as soon as possible on  the same day and twice-daily administration should be resumed. The dose should not be doubled to make up for a missed dose.  Important Safety Information A possible side effect of Eliquis is bleeding. You should call your healthcare provider right away if you experience any of the following: ? Bleeding from an injury  or your nose that does not stop. ? Unusual colored urine (red or dark brown) or unusual colored stools (red or black). ? Unusual bruising for unknown reasons. ? A serious fall or if you hit your head (even if there is no bleeding).  Some medicines may interact with Eliquis and might increase your risk of bleeding or clotting while on Eliquis. To help avoid this, consult your healthcare provider or pharmacist prior to using any new prescription or non-prescription medications, including herbals, vitamins, non-steroidal anti-inflammatory drugs (NSAIDs) and supplements.  This website has more information on Eliquis (apixaban): http://www.eliquis.com/eliquis/home

## 2020-06-06 NOTE — TOC Progression Note (Signed)
Transition of Care [facility]) - Progression Note    Patient Details  Name: Dwayne Barnes MRN: [id number] Date of Birth: 05-Aug-1950  [facility]) CM/SW Contact  Aleiah Mohammed, [place], [place] Phone Number: 06/06/2020, 1:03 PM  Clinical Narrative:     Patient's daughter informed of plan for patient to discharge tomorrow to [facility] by non-emergent ambulance.  [address] LCSW Transition of Care [phone]   Expected Discharge Plan: Skilled Nursing Facility Barriers to Discharge: Continued Medical Work up, English as a second language teacher, SNF Pending bed offer  Expected Discharge Plan and Services Expected Discharge Plan: Skilled Nursing Facility In-house Referral: Clinical Social Work Discharge Planning Services: CM Consult Post Acute Care Choice: Skilled Nursing Facility Living arrangements for the past 2 months: Single Family Home                                       Social Determinants of Health (SDOH) Interventions    Readmission Risk Interventions No flowsheet data found.

## 2020-06-06 NOTE — TOC Progression Note (Addendum)
Transition of Care [facility]) - Progression Note    Patient Details  Name: SAHAJ BONA MRN: [id number] Date of Birth: 03-May-1951  Transition of Care [facility]) CM/SW Contact  Cecilia Nishikawa, [place], [place] Phone Number: 06/06/2020, 11:42 AM  Clinical Narrative:    Phone call to [facility] to discuss patient's admission there today. Voicemail message left for a return call.  11:53pm Contact made with Allison-Admissions Director at the [facility]. They will have a bed ready for patient tomorrow.  [address] LCSW Transition of Care [phone], [place] Transition of Care [phone]    Expected Discharge Plan: Skilled Nursing Facility Barriers to Discharge: Continued Medical Work up, English as a second language teacher, SNF Pending bed offer  Expected Discharge Plan and Services Expected Discharge Plan: Skilled Nursing Facility In-house Referral: Clinical Social Work Discharge Planning Services: CM Consult Post Acute Care Choice: Skilled Nursing Facility Living arrangements for the past 2 months: Single Family Home                                       Social Determinants of Health (SDOH) Interventions    Readmission Risk Interventions No flowsheet data found.

## 2020-06-07 DIAGNOSIS — U071 COVID-19: Secondary | ICD-10-CM | POA: Diagnosis not present

## 2020-06-07 LAB — CBC WITH DIFFERENTIAL/PLATELET
Abs Immature Granulocytes: 0.1 10*3/uL — ABNORMAL HIGH (ref 0.00–0.07)
Basophils Absolute: 0 10*3/uL (ref 0.0–0.1)
Basophils Relative: 1 %
Eosinophils Absolute: 0.1 10*3/uL (ref 0.0–0.5)
Eosinophils Relative: 2 %
HCT: 29.6 % — ABNORMAL LOW (ref 39.0–52.0)
Hemoglobin: 9.6 g/dL — ABNORMAL LOW (ref 13.0–17.0)
Immature Granulocytes: 1 %
Lymphocytes Relative: 10 %
Lymphs Abs: 0.8 10*3/uL (ref 0.7–4.0)
MCH: 28.3 pg (ref 26.0–34.0)
MCHC: 32.4 g/dL (ref 30.0–36.0)
MCV: 87.3 fL (ref 80.0–100.0)
Monocytes Absolute: 0.7 10*3/uL (ref 0.1–1.0)
Monocytes Relative: 8 %
Neutro Abs: 6.3 10*3/uL (ref 1.7–7.7)
Neutrophils Relative %: 78 %
Platelets: 490 10*3/uL — ABNORMAL HIGH (ref 150–400)
RBC: 3.39 MIL/uL — ABNORMAL LOW (ref 4.22–5.81)
RDW: 14.4 % (ref 11.5–15.5)
WBC: 8.1 10*3/uL (ref 4.0–10.5)
nRBC: 0 % (ref 0.0–0.2)

## 2020-06-07 LAB — COMPREHENSIVE METABOLIC PANEL
ALT: 103 U/L — ABNORMAL HIGH (ref 0–44)
AST: 74 U/L — ABNORMAL HIGH (ref 15–41)
Albumin: 1.5 g/dL — ABNORMAL LOW (ref 3.5–5.0)
Alkaline Phosphatase: 201 U/L — ABNORMAL HIGH (ref 38–126)
Anion gap: 8 (ref 5–15)
BUN: 8 mg/dL (ref 8–23)
CO2: 23 mmol/L (ref 22–32)
Calcium: 7.9 mg/dL — ABNORMAL LOW (ref 8.9–10.3)
Chloride: 97 mmol/L — ABNORMAL LOW (ref 98–111)
Creatinine, Ser: 0.89 mg/dL (ref 0.61–1.24)
GFR, Estimated: >60 mL/min (ref >60)
Glucose, Bld: 104 mg/dL — ABNORMAL HIGH (ref 70–99)
Potassium: 4.5 mmol/L (ref 3.5–5.1)
Sodium: 128 mmol/L — ABNORMAL LOW (ref 135–145)
Total Bilirubin: 1 mg/dL (ref 0.3–1.2)
Total Protein: 4.7 g/dL — ABNORMAL LOW (ref 6.5–8.1)

## 2020-06-07 LAB — MAGNESIUM: Magnesium: 1.8 mg/dL (ref 1.7–2.4)

## 2020-06-07 LAB — CREATININE, URINE, RANDOM: Creatinine, Urine: 49.23 mg/dL

## 2020-06-07 LAB — OSMOLALITY, URINE: Osmolality, Ur: 311 mOsm/kg (ref 300–900)

## 2020-06-07 LAB — GLUCOSE, CAPILLARY
Glucose-Capillary: 97 mg/dL (ref 70–99)
Glucose-Capillary: 96 mg/dL (ref 70–99)
Glucose-Capillary: 116 mg/dL — ABNORMAL HIGH (ref 70–99)
Glucose-Capillary: 188 mg/dL — ABNORMAL HIGH (ref 70–99)
Glucose-Capillary: 121 mg/dL — ABNORMAL HIGH (ref 70–99)

## 2020-06-07 LAB — URIC ACID: Uric Acid, Serum: 2.1 mg/dL — ABNORMAL LOW (ref 3.7–8.6)

## 2020-06-07 LAB — OSMOLALITY: Osmolality: 270 mOsm/kg — ABNORMAL LOW (ref 275–295)

## 2020-06-07 LAB — SODIUM, URINE, RANDOM: Sodium, Ur: 56 mmol/L

## 2020-06-07 NOTE — Progress Notes (Signed)
Patient has had one bowel movement throughout the shift (type 5).

## 2020-06-07 NOTE — Progress Notes (Signed)
PROGRESS NOTE                                                                                                                                                                                                             Patient Demographics:    Dwayne Barnes, is a 69 y.o. male, DOB - 05/31/1951, BJY:[id number]  Outpatient Primary MD for the patient is [name], PA-C   Admit date - 05/20/2020   LOS - 17  Chief Complaint  Patient presents with  . Fatigue       Brief Narrative: Patient is a 69 y.o. male with no significant past medical history-presented to the ED on 11/17 with nausea/vomiting, weakness and back pain-further evaluation revealed COVID-19 infection and a large PE.  Patient was started on IV heparin infusion along with steroid/Remdesivir-on 11/19-patient had a cardiac arrest-was intubated during the code-total downtime of around 6 minutes before ROSC.  Patient was subsequently transferred to the ICU-unfortunately soon after arrival in the ICU-he had another cardiac arrest-and 4 rounds of CPR were provided with ROSC.  Patient was provided supportive care-he subsequently improved-self extubated on 11/23-he was transferred to the [facility] on 11/25.  Further hospital course complicated by diarrhea secondary to C. difficile along with AKI and hyponatremia.See below for further details.   COVID-19 vaccinated status: Vaccinated  Significant Events: 11/17>> Admit to [facility] for PE/COVID-19 infection 11/19>> PEA cardiac arrest x 2-given TPA-intubated-transfer to ICU 11/23>> Self extubated 11/25>> transfer to [facility] 11/29>> worsening diarrhea-CT abdomen with progressive colitis  Significant studies: 11/18>>CT renal stone: non specific colitis, GGO's, left nephrolithiasis without obstructive uropathy. 11/18>>CTA chest: large PC in LLL, GGO's LLL. 11/20 echo>> EF 50-55%, RV systolic function severely  reduced. 11/21>> B/L extremity Doppler: acute DVTin left femoral vein, acute mobile thrombus in the proximal femoral vein. 11/23>>CT Head: unremarkable   11/29>> CT abdomen/pelvis>> progressive colitis  COVID-19 medications: Steroids: 11/17>>11/26 Remdesivir: 11/17>> 11/22  Antibiotics: Oral vancomycin: 11/30>>  Microbiology data: 11/18>> urine culture: Insignificant growth 11/30>> stool C. difficile: +ve 11/30>> GI pathogen panel: negative  Procedures: ETT>>11/19 - 11/23 IVC filter placement by IR>>11/21.  Consults: PCCM, IR  DVT prophylaxis: Therapeutic Lovenox>> Eliquis    Subjective:   Patient in bed, appears comfortable, denies any headache, no fever, no chest pain or pressure, no shortness of breath , no abdominal pain. No  focal weakness.   Assessment  & Plan :   Cardiac arrest due to large PE: S/p TPA-was on therapeutic Lovenox-we will transition to Eliquis today. PE likely provoked by COVID-19 infection.  PCCM MD discussed with Dr. Illa Level indication for Impella support.  Continue telemetry monitoring.  Echo as above.  Large PE with DVT: Continue therapeutic anticoagulation-he is s/p TPA on 11/19 when he had cardiac arrest.  Patient is s/p IVC filter given mobile thrombus seen on lower extremity Doppler.  Once oral intake was stabilized-he was transitioned to Eliquis.  Acute hypoxic respiratory failure: Due to PE/cardiac arrest/COVID-19 pneumonia-self extubated on 11/23-currently stable on room air.  Acute metabolic encephalopathy/ICU delirium/possible anoxic injury: Mental status continues to slowly improve-getting more fluent with responses.  CT head without acute changes.  Continue supportive care.  Minimize all sedating medications.    Dysphagia: In the setting of cardiac arrest-seen by SLP-required NG tube-subsequently seen by SLP-diet gradually upgraded to a dysphagia 3 diet.  C. difficile colitis: Diarrhea improving-leukocytosis/AKI all getting better.   Continue oral vancomycin.  AKI: Likely hemodynamically mediated-due to diarrhea-has resolved with IV fluids and improvement in diarrhea.  COVID-19 pneumonia: Completed a course of Remdesivir-an steroids.  Hypoxia has resolved.   Paroxysmal atrial fibrillation: appears to be back in sinus rhythm-on anticoagulation for PE.  Echo as above.    Normocytic anemia: Secondary to critical illness-stable for monitoring-no indication for PRBC transfusion.  Transaminitis: Likely secondary to COVID-19-downtrending.  Deconditioning/debility: Secondary to critical illness- PT/OT eval completed-recommendations are for SNF   Hard of hearing - supportive care.  Hyponatremia.  Persistent.  Has been adequately hydrated, will check serum osmolality along with urine electrolytes.  Hold further fluids.  Could be SIADH.    GI prophylaxis: H2 blocker.  Condition - Extremely Guarded  Family Communication  :  Daughter Tayven Renteria [phone])  updated over the phone 06/05/20 by previous MD.  I called on 06/06/2020.    Code Status :  Full Code  Diet :  Diet Order            DIET DYS 3 Room service appropriate? No; Fluid consistency: Thin  Diet effective now                  Disposition Plan  :  Status is: Inpatient  Remains inpatient appropriate because:Inpatient level of care appropriate due to severity of illness   Dispo: The patient is from: Home              Anticipated d/c is to: SNF              Anticipated d/c date is: > 1 day              Patient currently is not medically stable to d/c.   Barriers to discharge: Persistent hyponatremia  Antimicorbials  :    Anti-infectives (From admission, onward)   Start     Dose/Rate Route Frequency Ordered Stop   06/02/20 1000  vancomycin (VANCOCIN) 50 mg/mL oral solution 125 mg        125 mg Oral 4 times daily 06/02/20 0800 06/12/20 0959   05/22/20 1000  remdesivir 100 mg in sodium chloride 0.9 % 100 mL IVPB       "Followed by" Linked  Group Details   100 mg 200 mL/hr over 30 Minutes Intravenous Daily 05/21/20 0857 05/25/20 1154   05/21/20 1030  remdesivir 200 mg in sodium chloride 0.9% 250 mL IVPB  Status:  Discontinued       "  Followed by" Linked Group Details   200 mg 580 mL/hr over 30 Minutes Intravenous Once 05/21/20 0857 05/27/20 0734   05/21/20 0330  metroNIDAZOLE (FLAGYL) tablet 500 mg        500 mg Oral  Once 05/21/20 0320 05/21/20 0411   05/20/20 2330  cefTRIAXone (ROCEPHIN) 2 g in sodium chloride 0.9 % 100 mL IVPB        2 g 200 mL/hr over 30 Minutes Intravenous  Once 05/20/20 2324 05/21/20 0331      Inpatient Medications  Scheduled Meds: . apixaban  5 mg Oral BID  . Chlorhexidine Gluconate Cloth  6 each Topical Daily  . famotidine  20 mg Oral Daily  . feeding supplement  237 mL Oral TID BM  . insulin aspart  0-9 Units Subcutaneous Q4H  . sodium chloride flush  10-40 mL Intracatheter Q12H  . vancomycin  125 mg Oral QID   Continuous Infusions:  PRN Meds:.acetaminophen (TYLENOL) oral liquid 160 mg/5 mL, [DISCONTINUED] ondansetron **OR** ondansetron (ZOFRAN) IV   Time Spent in minutes  25   See all Orders from today for further details   Susa Raring M.D on 06/07/2020 at 9:46 AM  To page go to [email or web address] - use universal password  [facility] -  Office  [phone]    Objective:   Vitals:   06/06/20 0758 06/06/20 1426 06/06/20 2100 06/07/20 0559  BP: 93/60 106/62 116/65 (!) 106/59  Pulse: 80 88 82 87  Resp: 19 19 18 19   Temp: 99.1 F (37.3 C) 99.1 F (37.3 C) 98.7 F (37.1 C) 98.9 F (37.2 C)  TempSrc: Oral Oral Oral Oral  SpO2: 96% 96% 96% 97%  Weight:    61.3 kg  Height:        Wt Readings from Last 3 Encounters:  06/07/20 61.3 kg  12/20/14 66 kg     Intake/Output Summary (Last 24 hours) at 06/07/2020 0946 Last data filed at 06/07/2020 14/11/2019 Gross per 24 hour  Intake --  Output 3150 ml  Net -3150 ml     Physical Exam  Awake Alert, No new F.N deficits,  extremely hard of hearing [place].AT,PERRAL Supple Neck,No JVD, No cervical lymphadenopathy appriciated.  Symmetrical Chest wall movement, Good air movement bilaterally, CTAB RRR,No Gallops, Rubs or new Murmurs, No Parasternal Heave +ve B.Sounds, Abd Soft, No tenderness, No organomegaly appriciated, No rebound - guarding or rigidity. No Cyanosis, Clubbing or edema, No new Rash or bruise     Data Review:    CBC Recent Labs  Lab 06/03/20 0117 06/04/20 0010 06/05/20 0316 06/06/20 0520 06/07/20 0500  WBC 17.0* 14.5* 10.7* 8.2 8.1  HGB 9.3* 9.2* 9.9* 9.4* 9.6*  HCT 26.7* 27.9* 30.0* 28.5* 29.6*  PLT 250 305 387 430* 490*  MCV 84.0 86.4 87.7 87.2 87.3  MCH 29.2 28.5 28.9 28.7 28.3  MCHC 34.8 33.0 33.0 33.0 32.4  RDW 14.0 14.3 14.5 14.4 14.4  LYMPHSABS  --   --   --   --  0.8  MONOABS  --   --   --   --  0.7  EOSABS  --   --   --   --  0.1  BASOSABS  --   --   --   --  0.0    Chemistries  Recent Labs  Lab 06/03/20 0117 06/04/20 0010 06/05/20 0316 06/06/20 0520 06/07/20 0500  NA 128* 129* 131* 131* 128*  K 3.9 4.2 4.9 4.3 4.5  CL 99 101 100 98 97*  CO2 21* 21* 23 22 23   GLUCOSE 105* 153* 97 103* 104*  BUN 12 11 8  6* 8  CREATININE 0.99 1.04 0.97 0.92 0.89  CALCIUM 7.5* 7.5* 7.9* 8.0* 7.9*  MG 1.7 1.8 1.8 2.0 1.8  AST 46* 57* 49* 88* 74*  ALT 57* 57* 66* 100* 103*  ALKPHOS 129* 151* 154* 207* 201*  BILITOT 1.2 0.9 1.1 1.0 1.0   ------------------------------------------------------------------------------------------------------------------ No results for input(s): CHOL, HDL, LDLCALC, TRIG, CHOLHDL, LDLDIRECT in the last 72 hours.  Lab Results  Component Value Date   HGBA1C 5.5 05/22/2020   ------------------------------------------------------------------------------------------------------------------ No results for input(s): TSH, T4TOTAL, T3FREE, THYROIDAB in the last 72 hours.  Invalid input(s):  FREET3 ------------------------------------------------------------------------------------------------------------------ No results for input(s): VITAMINB12, FOLATE, FERRITIN, TIBC, IRON, RETICCTPCT in the last 72 hours.  Coagulation profile No results for input(s): INR, PROTIME in the last 168 hours.  No results for input(s): DDIMER in the last 72 hours.  Cardiac Enzymes No results for input(s): CKMB, TROPONINI, MYOGLOBIN in the last 168 hours.  Invalid input(s): CK ------------------------------------------------------------------------------------------------------------------    Component Value Date/Time   BNP 499.2 (H) 05/23/2020 1244    Micro Results Recent Results (from the past 240 hour(s))  C Difficile Quick Screen (NO PCR Reflex)     Status: Abnormal   Collection Time: 06/02/20  8:41 AM   Specimen: STOOL  Result Value Ref Range Status   C Diff antigen POSITIVE (A) NEGATIVE Final   C Diff toxin POSITIVE (A) NEGATIVE Final   C Diff interpretation Toxin producing C. difficile detected.  Final    Comment: CRITICAL RESULT CALLED TO, READ BACK BY AND VERIFIED WITH: 05/25/2020 RN 12:05 06/02/20 ([email or web address]) Performed at [facility], [address], [place], [address] [place]   Gastrointestinal Panel by PCR , Stool     Status: None   Collection Time: 06/02/20  9:41 AM   Specimen: STOOL  Result Value Ref Range Status   Campylobacter species NOT DETECTED NOT DETECTED Final   Plesimonas shigelloides NOT DETECTED NOT DETECTED Final   Salmonella species NOT DETECTED NOT DETECTED Final   Yersinia enterocolitica NOT DETECTED NOT DETECTED Final   Vibrio species NOT DETECTED NOT DETECTED Final   Vibrio cholerae NOT DETECTED NOT DETECTED Final   Enteroaggregative E coli (EAEC) NOT DETECTED NOT DETECTED Final   Enteropathogenic E coli (EPEC) NOT DETECTED NOT DETECTED Final   Enterotoxigenic E coli (ETEC) NOT DETECTED NOT DETECTED Final   Shiga like toxin producing E coli  (STEC) NOT DETECTED NOT DETECTED Final   Shigella/Enteroinvasive E coli (EIEC) NOT DETECTED NOT DETECTED Final   Cryptosporidium NOT DETECTED NOT DETECTED Final   Cyclospora cayetanensis NOT DETECTED NOT DETECTED Final   Entamoeba histolytica NOT DETECTED NOT DETECTED Final   Giardia lamblia NOT DETECTED NOT DETECTED Final   Adenovirus F40/41 NOT DETECTED NOT DETECTED Final   Astrovirus NOT DETECTED NOT DETECTED Final   Norovirus GI/GII NOT DETECTED NOT DETECTED Final   Rotavirus A NOT DETECTED NOT DETECTED Final   Sapovirus (I, II, IV, and V) NOT DETECTED NOT DETECTED Final    Comment: Performed at [facility], [address], [place], [address] [place]    Radiology Reports CT ABDOMEN PELVIS WO CONTRAST  Result Date: 06/01/2020 CLINICAL DATA:  Left upper quadrant diarrhea, history of COVID-19 pneumonia. EXAM: CT ABDOMEN AND PELVIS WITHOUT CONTRAST TECHNIQUE: Multidetector CT imaging of the abdomen and pelvis was performed following the standard protocol without IV contrast. COMPARISON:  05/21/2020 FINDINGS: Lower  chest: Bibasilar consolidation is noted increased when compared with the prior exam. These changes are worse on the left than the right. Stable changes of prior COVID-19 pneumonia are noted. No sizable effusion is seen. Hepatobiliary: No focal liver abnormality is seen. No gallstones, gallbladder wall thickening, or biliary dilatation. Pancreas: Unremarkable. No pancreatic ductal dilatation or surrounding inflammatory changes. Spleen: Normal in size without focal abnormality. Adrenals/Urinary Tract: Adrenal glands are within normal limits. Kidneys demonstrate left renal pelvis stone stable in appearance. No obstructive changes are seen. The bladder is well distended. Stomach/Bowel: Diffuse edematous changes of the colonic wall are seen throughout its course increased when compared with the prior exam consistent with a generalized colitis. Some pericolonic inflammatory changes  noted particularly on the right increased from the prior exam. The appendix appears within normal limits. Small bowel and stomach are unremarkable. Vascular/Lymphatic: Atherosclerotic calcifications of the aorta are noted. IVC filter is noted in place. No sizable lymphadenopathy is noted. Reproductive: Prostate is unremarkable. Other: No abdominal wall hernia or abnormality. No abdominopelvic ascites. Changes of prior hernia repair are noted in the lower abdomen bilaterally Musculoskeletal: Degenerative changes of lumbar spine are noted. IMPRESSION: Increasing colonic wall thickness with pericolonic inflammatory change consistent with progressive colitis. Increasing bilateral lower lobe consolidation without sizable effusion. Some scattered changes consistent with the COVID-19 pneumonia are noted. Stable nonobstructing left renal stone. Electronically Signed   By: Alcide Clever M.D.   On: 06/01/2020 23:59   CT HEAD WO CONTRAST  Result Date: 05/26/2020 CLINICAL DATA:  69 year old male with altered mental status. EXAM: CT HEAD WITHOUT CONTRAST TECHNIQUE: Contiguous axial images were obtained from the base of the skull through the vertex without intravenous contrast. COMPARISON:  Head CT dated 04/18/2010. FINDINGS: Brain: The ventricles and sulci appropriate size for patient's age. The gray-white matter discrimination is preserved. There is no acute intracranial hemorrhage. No mass effect midline shift. No extra-axial fluid collection. Vascular: No hyperdense vessel or unexpected calcification. Skull: Normal. Negative for fracture or focal lesion. Sinuses/Orbits: No acute finding. Other: None IMPRESSION: Unremarkable noncontrast CT of the brain. Electronically Signed   By: Elgie Collard M.D.   On: 05/26/2020 15:45   CT ANGIO CHEST PE W OR WO CONTRAST  Addendum Date: 05/21/2020   ADDENDUM REPORT: 05/21/2020 19:28 ADDENDUM: Critical Value/emergent results were called by telephone at the time of interpretation  on 05/21/2020 at 7:27 pm to provider Dr. Dierdre Highman, who verbally acknowledged these results. Of note the airspace disease in the LEFT lower lobe is progressed from CT same day. Differential remains the same. Electronically Signed   By: Genevive Bi M.D.   On: 05/21/2020 19:28   Result Date: 05/21/2020 CLINICAL DATA:  PE suspected, high probability.  COVID-19 infection EXAM: CT ANGIOGRAPHY CHEST WITH CONTRAST TECHNIQUE: Multidetector CT imaging of the chest was performed using the standard protocol during bolus administration of intravenous contrast. Multiplanar CT image reconstructions and MIPs were obtained to evaluate the vascular anatomy. CONTRAST:  75mL OMNIPAQUE IOHEXOL 300 MG/ML  SOLN COMPARISON:  None. FINDINGS: Cardiovascular: Large filling defect within the proximal LEFT lower lobe pulmonary artery. Filling defect spans the bifurcation of the LEFT lower lobe and lingular branch pulmonary branches. This this filling defect/thrombus is occlusive to the LEFT lower lobe. No LEFT upper lobe filling defects identified. No filling defect identified within the RIGHT lung pulmonary arteries. No evidence of RIGHT ventricular strain with the RV to LV ratio less than 1. Coronary artery calcification and aortic atherosclerotic calcification. Mediastinum/Nodes: No axillary or  supraclavicular adenopathy. No mediastinal or hilar adenopathy. No pericardial fluid. Esophagus normal. Lungs/Pleura: There is diffuse very fine airspace disease within the LEFT lower lobe involving the majority of the LEFT lower lobe. Similar milder findings in the posterior RIGHT lower lobe and lingula. Upper Abdomen: Limited view of the liver, kidneys, pancreas are unremarkable. Normal adrenal glands. Musculoskeletal: No aggressive osseous lesion. Review of the MIP images confirms the above findings. IMPRESSION: 1. Large occlusive pulmonary embolism within the proximal LEFT lower lobe pulmonary artery and lingular pulmonary artery. 2. Diffuse  fine airspace disease within the LEFT lower lobe with differential including pulmonary infarction, pulmonary hemorrhage, or pneumonia (including COVID pneumonia). 3. No evidence of RIGHT ventricular strain. 4. Coronary artery calcification and aortic atherosclerotic calcification. Critical Value/emergent results were called by telephone at the time of interpretation on 05/21/2020 at 6:55 pm to provider [facility] , who verbally acknowledged these results. Electronically Signed: By: Genevive Bi M.D. On: 05/21/2020 18:59   IR IVC FILTER PLMT / S&I Lenise Arena GUID/MOD SED  Result Date: 05/24/2020 INDICATION: 69 year old with recent cardiac arrest likely secondary to pulmonary embolism and COVID-19. Subsequent right ventricular failure. Patient has clot in the left pulmonary arteries and clot in left femoral vein. There is concern that the patient would not tolerate additional clot burden in the pulmonary arteries and request for IVC filter placement. EXAM: IVC FILTER PLACEMENT; IVC VENOGRAM; ULTRASOUND FOR VASCULAR ACCESS Physician: Rachelle Hora. Lowella Dandy, MD MEDICATIONS: None. ANESTHESIA/SEDATION: Versed 1 mg The patient was continuously monitored during the procedure by the interventional radiology nurse under my direct supervision. CONTRAST:  Carbon dioxide FLUOROSCOPY TIME:  Fluoroscopy Time: 3 minutes, 18 seconds, 18 mGy COMPLICATIONS: None immediate. PROCEDURE: Informed consent was obtained for an IVC filter placement from the patient's daughter. Ultrasound demonstrated a patent right internal jugular vein. Ultrasound images were obtained for documentation. The right neck was prepped and draped in a sterile fashion. Maximal barrier sterile technique was utilized including caps, mask, sterile gowns, sterile gloves, sterile drape, hand hygiene and skin antiseptic. The skin was anesthetized with 1% lidocaine. A 21 gauge needle was directed into the vein with ultrasound guidance and a micropuncture dilator set was  placed. A wire was advanced into the IVC. The filter sheath was advanced over the wire into the IVC. An IVC venogram was performed with carbon dioxide. Bilateral renal veins were cannulated using a 5 French catheter and Bentson wire. Bilateral renal veins are identified. Fluoroscopic images were obtained for documentation. A Bard Denali filter was deployed below the lowest renal vein. Post placement images of the filter were obtained. The vascular sheath was removed with manual compression. Bandage placed over the puncture site. FINDINGS: IVC was patent. Bilateral renal veins were identified. The filter was deployed below the lowest renal vein. IMPRESSION: Successful placement of a retrievable IVC filter. PLAN: This IVC filter is potentially retrievable. The patient will be assessed for filter retrieval by Interventional Radiology in approximately 8-12 weeks. Further recommendations regarding filter retrieval, continued surveillance or declaration of device permanence, will be made at that time. Electronically Signed   By: Richarda Overlie M.D.   On: 05/24/2020 17:57   DG CHEST PORT 1 VIEW  Result Date: 05/26/2020 CLINICAL DATA:  Acute respiratory failure.  Hypoxia. EXAM: PORTABLE CHEST 1 VIEW COMPARISON:  05/22/2020. FINDINGS: Interim extubation and removal of NG tube. Left IJ line in stable position. Heart size normal. Diffuse left lung infiltrate. Right base infiltrate. No prominent pleural effusion. No pneumothorax. IMPRESSION: 1. Interim extubation and  removal of NG tube. Left IJ line stable position. 2. Diffuse left lung infiltrate and right base infiltrate. Electronically Signed   By: Maisie Fus  Register   On: 05/26/2020 05:50   DG CHEST PORT 1 VIEW  Result Date: 05/22/2020 CLINICAL DATA:  Central line placement, intubation EXAM: PORTABLE CHEST 1 VIEW COMPARISON:  05/21/2020 FINDINGS: Left central line has been placed with the tip in the SVC. No pneumothorax. Endotracheal tube is 5 cm above the carina.  Patchy airspace disease throughout the left lung. No focal opacity on the right. Heart is normal size. IMPRESSION: Endotracheal tube 5 cm above the carina. Left central line tip in the SVC. No pneumothorax. Stable patchy left lung airspace disease. Electronically Signed   By: Charlett Nose M.D.   On: 05/22/2020 21:06   DG Chest Port 1 View  Result Date: 05/21/2020 CLINICAL DATA:  Cough, lower back pain, has not eaten 5 days, emesis EXAM: PORTABLE CHEST 1 VIEW COMPARISON:  Radiograph 04/19/2010, CT 04/18/2010 FINDINGS: Heterogeneous opacities present predominantly along the periphery of the left lung and minimally in the right lung base as well. No pneumothorax or effusion. The aorta is calcified. The remaining cardiomediastinal contours are unremarkable. No acute osseous or soft tissue abnormality. Degenerative changes are present in the imaged spine and shoulders. Telemetry leads overlie the chest. IMPRESSION: Heterogeneous opacities throughout the lungs, greatest in the left lung periphery concerning for pneumonia including potential viral etiology. Aortic Atherosclerosis (ICD10-I70.0). Electronically Signed   By: Kreg Shropshire M.D.   On: 05/21/2020 03:50   DG Abd Portable 1V  Result Date: 05/23/2020 CLINICAL DATA:  Encounter for orogastric tube placement. EXAM: PORTABLE ABDOMEN - 1 VIEW COMPARISON:  06/09/2009 and chest radiograph 05/22/2020 FINDINGS: Gastric tube extends into the abdomen and the tip is in the midline of the lower abdomen, likely in the distal stomach body region. Again noted are patchy densities at the left lung base. Few gas-filled loops of bowel in the abdomen. IMPRESSION: Orogastric tube in the lower abdominal midline and likely in the distal stomach body region. Electronically Signed   By: Richarda Overlie M.D.   On: 05/23/2020 12:17   ECHOCARDIOGRAM COMPLETE  Result Date: 05/23/2020    ECHOCARDIOGRAM REPORT   Patient Name:   ESGAR BARNICK Date of Exam: 05/23/2020 Medical Rec #:   [id number]      Height:       72.0 in Accession #:    [id number]     Weight:       142.4 lb Date of Birth:  Dec 14, 1950      BSA:          1.844 m Patient Age:    69 years       BP:           112/85 mmHg Patient Gender: M              HR:           96 bpm. Exam Location:  Inpatient Procedure: 2D Echo, Cardiac Doppler and Color Doppler STAT ECHO Indications:    I26.02 Pulmonary embolus  History:        Patient has no prior history of Echocardiogram examinations.                 Arrythmias:Cardiac Arrest; Signs/Symptoms:Dyspnea, Hypotension                 and Altered Mental Status. Covid 19 positive.  Sonographer:    Sheralyn Boatman RDCS Referring Phys:  [id number] JONATHAN B DEWALD  Sonographer Comments: Technically difficult study due to poor echo windows and echo performed with patient supine and on artificial respirator. IMPRESSIONS  1. Severe RV failure and cor pulmonale.  2. Abnormal septal motion and septal flattening consistent with RV pressure overload. Left ventricular ejection fraction, by estimation, is 50 to 55%. The left ventricle has low normal function. The left ventricle has no regional wall motion abnormalities. Left ventricular diastolic parameters were normal.  3. Right ventricular systolic function is severely reduced. The right ventricular size is severely enlarged. There is mildly elevated pulmonary artery systolic pressure.  4. The mitral valve is normal in structure. No evidence of mitral valve regurgitation. No evidence of mitral stenosis.  5. The aortic valve is normal in structure. Aortic valve regurgitation is not visualized. No aortic stenosis is present.  6. The inferior vena cava is dilated in size with <50% respiratory variability, suggesting right atrial pressure of 15 mmHg. FINDINGS  Left Ventricle: Abnormal septal motion and septal flattening consistent with RV pressure overload. Left ventricular ejection fraction, by estimation, is 50 to 55%. The left ventricle has low normal function. The  left ventricle has no regional wall motion abnormalities. The left ventricular internal cavity size was normal in size. There is no left ventricular hypertrophy. Left ventricular diastolic parameters were normal. Right Ventricle: The right ventricular size is severely enlarged. Right vetricular wall thickness was not assessed. Right ventricular systolic function is severely reduced. There is mildly elevated pulmonary artery systolic pressure. The tricuspid regurgitant velocity is 2.67 m/s, and with an assumed right atrial pressure of 8 mmHg, the estimated right ventricular systolic pressure is 36.5 mmHg. Left Atrium: Left atrial size was normal in size. Right Atrium: Right atrial size was normal in size. Pericardium: There is no evidence of pericardial effusion. Mitral Valve: The mitral valve is normal in structure. No evidence of mitral valve regurgitation. No evidence of mitral valve stenosis. Tricuspid Valve: The tricuspid valve is normal in structure. Tricuspid valve regurgitation is mild . No evidence of tricuspid stenosis. Aortic Valve: The aortic valve is normal in structure. Aortic valve regurgitation is not visualized. No aortic stenosis is present. Pulmonic Valve: The pulmonic valve was normal in structure. Pulmonic valve regurgitation is not visualized. No evidence of pulmonic stenosis. Aorta: The aortic root is normal in size and structure. Venous: The inferior vena cava is dilated in size with less than 50% respiratory variability, suggesting right atrial pressure of 15 mmHg. IAS/Shunts: No atrial level shunt detected by color flow Doppler. Additional Comments: Severe RV failure and cor pulmonale.  LEFT VENTRICLE PLAX 2D LVIDd:         3.70 cm     Diastology LVIDs:         2.80 cm     LV e' medial:    4.46 cm/s LV PW:         1.10 cm     LV E/e' medial:  4.9 LV IVS:        1.10 cm     LV e' lateral:   6.97 cm/s LVOT diam:     2.10 cm     LV E/e' lateral: 3.1 LV SV:         28 LV SV Index:   15 LVOT  Area:     3.46 cm  LV Volumes (MOD) LV vol d, MOD A2C: 22.6 ml LV vol d, MOD A4C: 25.5 ml LV vol s, MOD A2C: 11.1 ml LV vol s, MOD  A4C: 11.7 ml LV SV MOD A2C:     11.5 ml LV SV MOD A4C:     25.5 ml LV SV MOD BP:      13.7 ml RIGHT VENTRICLE            IVC RV S prime:     8.84 cm/s  IVC diam: 2.50 cm TAPSE (M-mode): 1.3 cm LEFT ATRIUM           Index      RIGHT ATRIUM           Index LA diam:      2.80 cm 1.52 cm/m RA Area:     14.30 cm LA Vol (A4C): 10.5 ml 5.69 ml/m RA Volume:   42.70 ml  23.15 ml/m  AORTIC VALVE LVOT Vmax:   53.80 cm/s LVOT Vmean:  41.900 cm/s LVOT VTI:    0.080 m  AORTA Ao Root diam: 3.60 cm Ao Asc diam:  3.50 cm MITRAL VALVE               TRICUSPID VALVE MV Area (PHT): 3.27 cm    TR Peak grad:   28.5 mmHg MV Decel Time: 232 msec    TR Vmax:        267.00 cm/s MV E velocity: 21.94 cm/s MV A velocity: 41.30 cm/s  SHUNTS MV E/A ratio:  0.53        Systemic VTI:  0.08 m                            Systemic Diam: 2.10 cm Charlton Haws MD Electronically signed by Charlton Haws MD Signature Date/Time: 05/23/2020/10:46:38 AM    Final    CT RENAL STONE STUDY  Result Date: 05/21/2020 CLINICAL DATA:  69 year old male with low back pain, flank pain, decreased p.o. EXAM: CT ABDOMEN AND PELVIS WITHOUT CONTRAST TECHNIQUE: Multidetector CT imaging of the abdomen and pelvis was performed following the standard protocol without IV contrast. COMPARISON:  Noncontrast CT Abdomen and Pelvis 10/28/2008. Portable chest radiograph 04/19/2010. FINDINGS: Lower chest: Patchy, irregular peribronchial and peripheral scattered pulmonary opacity at both lung bases. This seems to be new from last month. Underlying probable pulmonary hyperinflation. Some associated lung base bronchiectasis. No cavitating areas identified. No cardiomegaly, pericardial effusion or pleural effusion. Hepatobiliary: Negative noncontrast liver and gallbladder. Pancreas: Negative. Spleen: Negative. Adrenals/Urinary Tract: Bulky left  nephrolithiasis measuring 13 mm at the renal pelvis. Additional left lower pole stone. No hydronephrosis. Negative noncontrast right kidney. No hydroureter. Unremarkable urinary bladder. Incidental pelvic phleboliths. Stomach/Bowel: Decompressed large bowel. There is long segment circumferential wall thickening in the right colon, up to the 12 mm series 4, image 49) with some areas of adjacent mesenteric stranding (coronal image 43). The wall thickening gradually decreases through the transverse colon. No free air. No free fluid. The terminal ileum appear spared. Appendix seems to remain normal on coronal image 32. No dilated small bowel.  Decompressed stomach and duodenum. Vascular/Lymphatic: Normal caliber abdominal aorta. Mild calcified atherosclerosis. Vascular patency is not evaluated in the absence of IV contrast. No lymphadenopathy identified in the absence of contrast. Reproductive: Negative. Other: Previous lower abdominal ventral hernia repair with mesh. No pelvic free fluid. Musculoskeletal: Degenerative changes in the spine. No acute osseous abnormality identified. IMPRESSION: 1. In the abdomen there is circumferential bowel wall thickening and mesenteric stranding involving the right colon. Wall thickening gradually abates through the transverse colon. This is compatible with Acute Nonspecific Colitis. 2.  But the lung bases are also abnormal with patchy and irregular peribronchial and peripheral pulmonary opacity suspicious for acute viral/atypical respiratory infection. No areas of cavitation to suggest septic emboli. No pleural effusion. 3. Bulky left nephrolithiasis, but no obstructive uropathy. 4. Previous lower abdominal hernia repair with mesh. Electronically Signed   By: Odessa Fleming M.D.   On: 05/21/2020 02:46   VAS [place] LOWER EXTREMITY VENOUS (DVT)  Result Date: 05/24/2020  Lower Venous DVT Study Indications: Covid-19, pulmonary embolism.  Risk Factors: Confirmed PE. Anticoagulation: Heparin.  Limitations: Body habitus, ventilation, did not perform compressions in thigh and popliteal fossa of the left lower extremity secondary to mobile thrombus, bandages and line. Comparison Study: No prior study Performing Technologist: Sherren Kerns RVS  Examination Guidelines: A complete evaluation includes B-mode imaging, spectral Doppler, color Doppler, and power Doppler as needed of all accessible portions of each vessel. Bilateral testing is considered an integral part of a complete examination. Limited examinations for reoccurring indications may be performed as noted. The reflux portion of the exam is performed with the patient in reverse Trendelenburg.  +---------+---------------+---------+-----------+----------+--------------+ RIGHT    CompressibilityPhasicitySpontaneityPropertiesThrombus Aging +---------+---------------+---------+-----------+----------+--------------+ CFV      Full           Yes      No                                  +---------+---------------+---------+-----------+----------+--------------+ SFJ      Full                                                        +---------+---------------+---------+-----------+----------+--------------+ FV Prox  Full                                                        +---------+---------------+---------+-----------+----------+--------------+ FV Mid   Full                                                        +---------+---------------+---------+-----------+----------+--------------+ FV DistalFull                                                        +---------+---------------+---------+-----------+----------+--------------+ PFV      Full                                                        +---------+---------------+---------+-----------+----------+--------------+ POP      Full           Yes      No                                   +---------+---------------+---------+-----------+----------+--------------+  PTV      Full                                                        +---------+---------------+---------+-----------+----------+--------------+ PERO     Full                                                        +---------+---------------+---------+-----------+----------+--------------+   +---------+---------------+---------+-----------+----------+-------------------+ LEFT     CompressibilityPhasicitySpontaneityPropertiesThrombus Aging      +---------+---------------+---------+-----------+----------+-------------------+ CFV                                                   not visualized                                                            secondary to                                                              bandage/line        +---------+---------------+---------+-----------+----------+-------------------+ SFJ                                                   not visualized                                                            secondary to                                                              bandage/line        +---------+---------------+---------+-----------+----------+-------------------+ FV Prox  Full                                         patent by color     +---------+---------------+---------+-----------+----------+-------------------+ FV Mid  patent by color     +---------+---------------+---------+-----------+----------+-------------------+ FV Distal                                             patent by color     +---------+---------------+---------+-----------+----------+-------------------+ PFV                                                   patent by color     +---------+---------------+---------+-----------+----------+-------------------+ POP                                                    patent by color     +---------+---------------+---------+-----------+----------+-------------------+ PTV      Full                                                             +---------+---------------+---------+-----------+----------+-------------------+ PERO     Full                                                             +---------+---------------+---------+-----------+----------+-------------------+     Summary: RIGHT: - There is no evidence of deep vein thrombosis in the lower extremity.  vessels are dilated with significantly sluggish flow, throughout  LEFT: - Findings consistent with acute deep vein thrombosis involving the left femoral vein. - Acute, mobile thrombus noted in the proximal and mid femoral vein. Vessels are dilated with significantly sluggish flow, throughout.  *See table(s) above for measurements and observations. Electronically signed by Waverly Ferrarihristopher Dickson MD on 05/24/2020 at 6:21:22 PM.    Final

## 2020-06-07 NOTE — TOC Progression Note (Signed)
Transition of Care [facility]) - Progression Note    Patient Details  Name: Dwayne Barnes MRN: [id number] Date of Birth: 01-03-1951  Transition [facility]) CM/SW Contact  [address] [place], [place] Phone Number: 06/07/2020, 10:17 AM  Clinical Narrative:    Phone call from [facility]. Patient's acceptance delayed until treatment is completed for C-diff and he has no further episodes of diarrhea. Patient's daughter to be informed.   [address], LCSW Transition of Care [phone]    Expected Discharge Plan: Skilled Nursing Facility Barriers to Discharge: Continued Medical Work up, English as a second language teacher, SNF Pending bed offer  Expected Discharge Plan and Services Expected Discharge Plan: Skilled Nursing Facility In-house Referral: Clinical Social Work Discharge Planning Services: CM Consult Post Acute Care Choice: Skilled Nursing Facility Living arrangements for the past 2 months: Single Family Home                                       Social Determinants of Health (SDOH) Interventions    Readmission Risk Interventions No flowsheet data found.

## 2020-06-08 DIAGNOSIS — U071 COVID-19: Secondary | ICD-10-CM | POA: Diagnosis not present

## 2020-06-08 LAB — MAGNESIUM: Magnesium: 1.9 mg/dL (ref 1.7–2.4)

## 2020-06-08 LAB — CBC WITH DIFFERENTIAL/PLATELET
Abs Immature Granulocytes: 0.13 10*3/uL — ABNORMAL HIGH (ref 0.00–0.07)
Basophils Absolute: 0 10*3/uL (ref 0.0–0.1)
Basophils Relative: 0 %
Eosinophils Absolute: 0.1 10*3/uL (ref 0.0–0.5)
Eosinophils Relative: 2 %
HCT: 28.5 % — ABNORMAL LOW (ref 39.0–52.0)
Hemoglobin: 9.2 g/dL — ABNORMAL LOW (ref 13.0–17.0)
Immature Granulocytes: 1 %
Lymphocytes Relative: 12 %
Lymphs Abs: 1.2 10*3/uL (ref 0.7–4.0)
MCH: 28.2 pg (ref 26.0–34.0)
MCHC: 32.3 g/dL (ref 30.0–36.0)
MCV: 87.4 fL (ref 80.0–100.0)
Monocytes Absolute: 0.7 10*3/uL (ref 0.1–1.0)
Monocytes Relative: 7 %
Neutro Abs: 7.3 10*3/uL (ref 1.7–7.7)
Neutrophils Relative %: 78 %
Platelets: 510 10*3/uL — ABNORMAL HIGH (ref 150–400)
RBC: 3.26 MIL/uL — ABNORMAL LOW (ref 4.22–5.81)
RDW: 14.2 % (ref 11.5–15.5)
WBC: 9.4 10*3/uL (ref 4.0–10.5)
nRBC: 0 % (ref 0.0–0.2)

## 2020-06-08 LAB — GLUCOSE, CAPILLARY
Glucose-Capillary: 105 mg/dL — ABNORMAL HIGH (ref 70–99)
Glucose-Capillary: 105 mg/dL — ABNORMAL HIGH (ref 70–99)
Glucose-Capillary: 108 mg/dL — ABNORMAL HIGH (ref 70–99)
Glucose-Capillary: 117 mg/dL — ABNORMAL HIGH (ref 70–99)
Glucose-Capillary: 136 mg/dL — ABNORMAL HIGH (ref 70–99)
Glucose-Capillary: 141 mg/dL — ABNORMAL HIGH (ref 70–99)
Glucose-Capillary: 95 mg/dL (ref 70–99)

## 2020-06-08 LAB — COMPREHENSIVE METABOLIC PANEL
ALT: 105 U/L — ABNORMAL HIGH (ref 0–44)
AST: 79 U/L — ABNORMAL HIGH (ref 15–41)
Albumin: 1.5 g/dL — ABNORMAL LOW (ref 3.5–5.0)
Alkaline Phosphatase: 203 U/L — ABNORMAL HIGH (ref 38–126)
Anion gap: 9 (ref 5–15)
BUN: 11 mg/dL (ref 8–23)
CO2: 23 mmol/L (ref 22–32)
Calcium: 8 mg/dL — ABNORMAL LOW (ref 8.9–10.3)
Chloride: 95 mmol/L — ABNORMAL LOW (ref 98–111)
Creatinine, Ser: 1.08 mg/dL (ref 0.61–1.24)
GFR, Estimated: >60 mL/min (ref >60)
Glucose, Bld: 108 mg/dL — ABNORMAL HIGH (ref 70–99)
Potassium: 4.5 mmol/L (ref 3.5–5.1)
Sodium: 127 mmol/L — ABNORMAL LOW (ref 135–145)
Total Bilirubin: 1 mg/dL (ref 0.3–1.2)
Total Protein: 4.8 g/dL — ABNORMAL LOW (ref 6.5–8.1)

## 2020-06-08 MED ORDER — LACTATED RINGERS IV SOLN: INTRAVENOUS | Status: AC

## 2020-06-08 NOTE — Progress Notes (Signed)
This morning, patient had a large mushy BM (type 6) dark brown in color and this afternoon had a medium BM (type 5) brown in color.

## 2020-06-08 NOTE — Progress Notes (Signed)
PROGRESS NOTE                                                                                                                                                                                                             Patient Demographics:    Dwayne Barnes, is a 69 y.o. male, DOB - [birth year], ONG:[id number]  Outpatient Primary MD for the patient is [name], PA-C   Admit date - 05/20/2020   LOS - 18  Chief Complaint  Patient presents with  . Fatigue       Brief Narrative: Patient is a 69 y.o. male with no significant past medical history-presented to the ED on 11/17 with nausea/vomiting, weakness and back pain-further evaluation revealed COVID-19 infection and a large PE.  Patient was started on IV heparin infusion along with steroid/Remdesivir-on 11/19-patient had a cardiac arrest-was intubated during the code-total downtime of around 6 minutes before ROSC.  Patient was subsequently transferred to the ICU-unfortunately soon after arrival in the ICU-he had another cardiac arrest-and 4 rounds of CPR were provided with ROSC.  Patient was provided supportive care-he subsequently improved-self extubated on 11/23-he was [facility] on 11/25.  Further hospital course complicated by diarrhea secondary to C. difficile along with AKI and hyponatremia.See below for further details.   COVID-19 vaccinated status: Vaccinated  Significant Events: 11/17>> Admit to [facility] for PE/COVID-19 infection 11/19>> PEA cardiac arrest x 2-given TPA-intubated-transfer to ICU 11/23>> Self extubated 11/25>> transfer to [facility] 11/29>> worsening diarrhea-CT abdomen with progressive colitis  Significant studies: 11/18>>CT renal stone: non specific colitis, GGO's, left nephrolithiasis without obstructive uropathy. 11/18>>CTA chest: large PC in LLL, GGO's LLL. 11/20 echo>> EF 50-55%, RV systolic function severely  reduced. 11/21>> B/L extremity Doppler: acute DVTin left femoral vein, acute mobile thrombus in the proximal femoral vein. 11/23>>CT Head: unremarkable   11/29>> CT abdomen/pelvis>> progressive colitis  COVID-19 medications: Steroids: 11/17>>11/26 Remdesivir: 11/17>> 11/22  Antibiotics: Oral vancomycin: 11/30>>  Microbiology data: 11/18>> urine culture: Insignificant growth 11/30>> stool C. difficile: +ve 11/30>> GI pathogen panel: negative  Procedures: ETT>>11/19 - 11/23 IVC filter placement by IR>>11/21.  Consults: PCCM, IR  DVT prophylaxis: Therapeutic Lovenox>> Eliquis    Subjective:   Patient in bed, appears comfortable, denies any headache, no fever, no chest pain or pressure, no shortness of breath , no abdominal pain. No  focal weakness.  Improving diarrhea.   Assessment  & Plan :   Cardiac arrest due to large PE: S/p TPA-was on therapeutic Lovenox-we will transition to Eliquis today. PE likely provoked by COVID-19 infection.  PCCM MD discussed with Dr. Illa Level indication for any acute intervention.  Continue telemetry monitoring.  Echo as above.  Large PE with DVT: Continue therapeutic anticoagulation-he is s/p TPA on 11/19 when he had cardiac arrest.  Patient is s/p IVC filter given mobile thrombus seen on lower extremity Doppler.  Once oral intake was stabilized-he was transitioned to Eliquis.  Acute hypoxic respiratory failure: Due to PE/cardiac arrest/COVID-19 pneumonia-self extubated on 11/23-currently stable on room air.  Acute metabolic encephalopathy/ICU delirium/possible anoxic injury: Mental status continues to slowly improve-getting more fluent with responses.  CT head without acute changes.  Continue supportive care.  Minimize all sedating medications.    Dysphagia: In the setting of cardiac arrest-seen by SLP-required NG tube-subsequently seen by SLP-diet gradually upgraded to a dysphagia 3 diet.  C. difficile colitis: Diarrhea  improving-leukocytosis/AKI all getting better.  Continue oral vancomycin.  AKI: Likely hemodynamically mediated-due to diarrhea-has resolved with IV fluids and improvement in diarrhea.  COVID-19 pneumonia: Completed a course of Remdesivir-an steroids.  Hypoxia has resolved.   Paroxysmal atrial fibrillation: appears to be back in sinus rhythm-on anticoagulation for PE.  Echo as above.    Normocytic anemia: Secondary to critical illness-stable for monitoring-no indication for PRBC transfusion.  Transaminitis: Likely secondary to COVID-19-downtrending.  Deconditioning/debility: Secondary to critical illness- PT/OT eval completed-recommendations are for SNF, SNF does not want him at the SNF till his stools are formed and C. difficile has clinically resolved.  Monitoring closely.  Hard of hearing - supportive care.  Hyponatremia.  Persistent.  Became hypotensive 06/08/2020 hence IV fluids, other electrolytes including low serum uric acid point more towards SIADH but due to hypotension requires fluids.  Will monitor.    GI prophylaxis: H2 blocker.  Condition - Extremely Guarded  Family Communication  :  Daughter Kasyn Rolph [phone])  updated over the phone 06/05/20 by previous MD.  I called on 06/06/2020.    Code Status :  Full Code  Diet :  Diet Order            DIET DYS 3 Room service appropriate? No; Fluid consistency: Thin; Fluid restriction: 1200 mL Fluid  Diet effective now                  Disposition Plan  :  Status is: Inpatient  Remains inpatient appropriate because:Inpatient level of care appropriate due to severity of illness   Dispo: The patient is from: Home              Anticipated d/c is to: SNF              Anticipated d/c date is: > 1 day              Patient currently is not medically stable to d/c.   Barriers to discharge: Persistent hyponatremia  Antimicorbials  :    Anti-infectives (From admission, onward)   Start     Dose/Rate Route  Frequency Ordered Stop   06/02/20 1000  vancomycin (VANCOCIN) 50 mg/mL oral solution 125 mg        125 mg Oral 4 times daily 06/02/20 0800 06/12/20 0959   05/22/20 1000  remdesivir 100 mg in sodium chloride 0.9 % 100 mL IVPB       "Followed by" Linked Group Details  100 mg 200 mL/hr over 30 Minutes Intravenous Daily 05/21/20 0857 05/25/20 1154   05/21/20 1030  remdesivir 200 mg in sodium chloride 0.9% 250 mL IVPB  Status:  Discontinued       "Followed by" Linked Group Details   200 mg 580 mL/hr over 30 Minutes Intravenous Once 05/21/20 0857 05/27/20 0734   05/21/20 0330  metroNIDAZOLE (FLAGYL) tablet 500 mg        500 mg Oral  Once 05/21/20 0320 05/21/20 0411   05/20/20 2330  cefTRIAXone (ROCEPHIN) 2 g in sodium chloride 0.9 % 100 mL IVPB        2 g 200 mL/hr over 30 Minutes Intravenous  Once 05/20/20 2324 05/21/20 0331      Inpatient Medications  Scheduled Meds: . apixaban  5 mg Oral BID  . Chlorhexidine Gluconate Cloth  6 each Topical Daily  . famotidine  20 mg Oral Daily  . feeding supplement  237 mL Oral TID BM  . insulin aspart  0-9 Units Subcutaneous Q4H  . sodium chloride flush  10-40 mL Intracatheter Q12H  . vancomycin  125 mg Oral QID   Continuous Infusions: . lactated ringers 125 mL/hr at 06/08/20 0759   PRN Meds:.acetaminophen (TYLENOL) oral liquid 160 mg/5 mL, [DISCONTINUED] ondansetron **OR** ondansetron (ZOFRAN) IV   Time Spent in minutes  25   See all Orders from today for further details   Susa Raring M.D on 06/08/2020 at 9:56 AM  To page go to [email or web address] - use universal password  [facility] -  Office  [phone]    Objective:   Vitals:   06/07/20 2015 06/07/20 2100 06/08/20 0500 06/08/20 0505  BP:  107/61  (!) 83/55  Pulse:  91  84  Resp:  18  20  Temp:  99.1 F (37.3 C)  98.7 F (37.1 C)  TempSrc:  Oral  Oral  SpO2: 96% 96%  94%  Weight:   59 kg   Height:        Wt Readings from Last 3 Encounters:  06/08/20 59 kg   12/20/14 66 kg     Intake/Output Summary (Last 24 hours) at 06/08/2020 0956 Last data filed at 06/08/2020 0900 Gross per 24 hour  Intake 540 ml  Output 2100 ml  Net -1560 ml     Physical Exam  Awake Alert, No new F.N deficits, Normal affect, extremely hard of hearing [place].AT,PERRAL Supple Neck,No JVD, No cervical lymphadenopathy appriciated.  Symmetrical Chest wall movement, Good air movement bilaterally, CTAB RRR,No Gallops, Rubs or new Murmurs, No Parasternal Heave +ve B.Sounds, Abd Soft, No tenderness, No organomegaly appriciated, No rebound - guarding or rigidity. No Cyanosis, Clubbing or edema, No new Rash or bruise    Data Review:    CBC Recent Labs  Lab 06/04/20 0010 06/05/20 0316 06/06/20 0520 06/07/20 0500 06/08/20 0514  WBC 14.5* 10.7* 8.2 8.1 9.4  HGB 9.2* 9.9* 9.4* 9.6* 9.2*  HCT 27.9* 30.0* 28.5* 29.6* 28.5*  PLT 305 387 430* 490* 510*  MCV 86.4 87.7 87.2 87.3 87.4  MCH 28.5 28.9 28.7 28.3 28.2  MCHC 33.0 33.0 33.0 32.4 32.3  RDW 14.3 14.5 14.4 14.4 14.2  LYMPHSABS  --   --   --  0.8 1.2  MONOABS  --   --   --  0.7 0.7  EOSABS  --   --   --  0.1 0.1  BASOSABS  --   --   --  0.0 0.0    Chemistries  Recent Labs  Lab 06/04/20 0010 06/05/20 0316 06/06/20 0520 06/07/20 0500 06/08/20 0514  NA 129* 131* 131* 128* 127*  K 4.2 4.9 4.3 4.5 4.5  CL 101 100 98 97* 95*  CO2 21* 23 22 23 23   GLUCOSE 153* 97 103* 104* 108*  BUN 11 8 6* 8 11  CREATININE 1.04 0.97 0.92 0.89 1.08  CALCIUM 7.5* 7.9* 8.0* 7.9* 8.0*  MG 1.8 1.8 2.0 1.8 1.9  AST 57* 49* 88* 74* 79*  ALT 57* 66* 100* 103* 105*  ALKPHOS 151* 154* 207* 201* 203*  BILITOT 0.9 1.1 1.0 1.0 1.0   ------------------------------------------------------------------------------------------------------------------ No results for input(s): CHOL, HDL, LDLCALC, TRIG, CHOLHDL, LDLDIRECT in the last 72 hours.  Lab Results  Component Value Date   HGBA1C 5.5 05/22/2020    ------------------------------------------------------------------------------------------------------------------ No results for input(s): TSH, T4TOTAL, T3FREE, THYROIDAB in the last 72 hours.  Invalid input(s): FREET3 ------------------------------------------------------------------------------------------------------------------ No results for input(s): VITAMINB12, FOLATE, FERRITIN, TIBC, IRON, RETICCTPCT in the last 72 hours.  Coagulation profile No results for input(s): INR, PROTIME in the last 168 hours.  No results for input(s): DDIMER in the last 72 hours.  Cardiac Enzymes No results for input(s): CKMB, TROPONINI, MYOGLOBIN in the last 168 hours.  Invalid input(s): CK ------------------------------------------------------------------------------------------------------------------    Component Value Date/Time   BNP 499.2 (H) 05/23/2020 1244    Micro Results Recent Results (from the past 240 hour(s))  C Difficile Quick Screen (NO PCR Reflex)     Status: Abnormal   Collection Time: 06/02/20  8:41 AM   Specimen: STOOL  Result Value Ref Range Status   C Diff antigen POSITIVE (A) NEGATIVE Final   C Diff toxin POSITIVE (A) NEGATIVE Final   C Diff interpretation Toxin producing C. difficile detected.  Final    Comment: CRITICAL RESULT CALLED TO, READ BACK BY AND VERIFIED WITH: Tenna ChildE. Fowler RN 12:05 06/02/20 ([email or web address]) Performed at [facility], [address] [place], [place] [address]   Gastrointestinal Panel by PCR , Stool     Status: None   Collection Time: 06/02/20  9:41 AM   Specimen: STOOL  Result Value Ref Range Status   Campylobacter species NOT DETECTED NOT DETECTED Final   Plesimonas shigelloides NOT DETECTED NOT DETECTED Final   Salmonella species NOT DETECTED NOT DETECTED Final   Yersinia enterocolitica NOT DETECTED NOT DETECTED Final   Vibrio species NOT DETECTED NOT DETECTED Final   Vibrio cholerae NOT DETECTED NOT DETECTED Final   Enteroaggregative  E coli (EAEC) NOT DETECTED NOT DETECTED Final   Enteropathogenic E coli (EPEC) NOT DETECTED NOT DETECTED Final   Enterotoxigenic E coli (ETEC) NOT DETECTED NOT DETECTED Final   Shiga like toxin producing E coli (STEC) NOT DETECTED NOT DETECTED Final   Shigella/Enteroinvasive E coli (EIEC) NOT DETECTED NOT DETECTED Final   Cryptosporidium NOT DETECTED NOT DETECTED Final   Cyclospora cayetanensis NOT DETECTED NOT DETECTED Final   Entamoeba histolytica NOT DETECTED NOT DETECTED Final   Giardia lamblia NOT DETECTED NOT DETECTED Final   Adenovirus F40/41 NOT DETECTED NOT DETECTED Final   Astrovirus NOT DETECTED NOT DETECTED Final   Norovirus GI/GII NOT DETECTED NOT DETECTED Final   Rotavirus A NOT DETECTED NOT DETECTED Final   Sapovirus (I, II, IV, and V) NOT DETECTED NOT DETECTED Final    Comment: Performed at [facility], [address] [place], [place] [address]    Radiology Reports CT ABDOMEN PELVIS WO CONTRAST  Result Date: 06/01/2020 CLINICAL DATA:  Left upper  quadrant diarrhea, history of COVID-19 pneumonia. EXAM: CT ABDOMEN AND PELVIS WITHOUT CONTRAST TECHNIQUE: Multidetector CT imaging of the abdomen and pelvis was performed following the standard protocol without IV contrast. COMPARISON:  05/21/2020 FINDINGS: Lower chest: Bibasilar consolidation is noted increased when compared with the prior exam. These changes are worse on the left than the right. Stable changes of prior COVID-19 pneumonia are noted. No sizable effusion is seen. Hepatobiliary: No focal liver abnormality is seen. No gallstones, gallbladder wall thickening, or biliary dilatation. Pancreas: Unremarkable. No pancreatic ductal dilatation or surrounding inflammatory changes. Spleen: Normal in size without focal abnormality. Adrenals/Urinary Tract: Adrenal glands are within normal limits. Kidneys demonstrate left renal pelvis stone stable in appearance. No obstructive changes are seen. The bladder is well distended.  Stomach/Bowel: Diffuse edematous changes of the colonic wall are seen throughout its course increased when compared with the prior exam consistent with a generalized colitis. Some pericolonic inflammatory changes noted particularly on the right increased from the prior exam. The appendix appears within normal limits. Small bowel and stomach are unremarkable. Vascular/Lymphatic: Atherosclerotic calcifications of the aorta are noted. IVC filter is noted in place. No sizable lymphadenopathy is noted. Reproductive: Prostate is unremarkable. Other: No abdominal wall hernia or abnormality. No abdominopelvic ascites. Changes of prior hernia repair are noted in the lower abdomen bilaterally Musculoskeletal: Degenerative changes of lumbar spine are noted. IMPRESSION: Increasing colonic wall thickness with pericolonic inflammatory change consistent with progressive colitis. Increasing bilateral lower lobe consolidation without sizable effusion. Some scattered changes consistent with the COVID-19 pneumonia are noted. Stable nonobstructing left renal stone. Electronically Signed   By: Alcide Clever M.D.   On: 06/01/2020 23:59   CT HEAD WO CONTRAST  Result Date: 05/26/2020 CLINICAL DATA:  69 year old male with altered mental status. EXAM: CT HEAD WITHOUT CONTRAST TECHNIQUE: Contiguous axial images were obtained from the base of the skull through the vertex without intravenous contrast. COMPARISON:  Head CT dated 04/18/2010. FINDINGS: Brain: The ventricles and sulci appropriate size for patient's age. The gray-white matter discrimination is preserved. There is no acute intracranial hemorrhage. No mass effect midline shift. No extra-axial fluid collection. Vascular: No hyperdense vessel or unexpected calcification. Skull: Normal. Negative for fracture or focal lesion. Sinuses/Orbits: No acute finding. Other: None IMPRESSION: Unremarkable noncontrast CT of the brain. Electronically Signed   By: Elgie Collard M.D.   On:  05/26/2020 15:45   CT ANGIO CHEST PE W OR WO CONTRAST  Addendum Date: 05/21/2020   ADDENDUM REPORT: 05/21/2020 19:28 ADDENDUM: Critical Value/emergent results were called by telephone at the time of interpretation on 05/21/2020 at 7:27 pm to provider Dr. Dierdre Highman, who verbally acknowledged these results. Of note the airspace disease in the LEFT lower lobe is progressed from CT same day. Differential remains the same. Electronically Signed   By: Genevive Bi M.D.   On: 05/21/2020 19:28   Result Date: 05/21/2020 CLINICAL DATA:  PE suspected, high probability.  COVID-19 infection EXAM: CT ANGIOGRAPHY CHEST WITH CONTRAST TECHNIQUE: Multidetector CT imaging of the chest was performed using the standard protocol during bolus administration of intravenous contrast. Multiplanar CT image reconstructions and MIPs were obtained to evaluate the vascular anatomy. CONTRAST:  34mL OMNIPAQUE IOHEXOL 300 MG/ML  SOLN COMPARISON:  None. FINDINGS: Cardiovascular: Large filling defect within the proximal LEFT lower lobe pulmonary artery. Filling defect spans the bifurcation of the LEFT lower lobe and lingular branch pulmonary branches. This this filling defect/thrombus is occlusive to the LEFT lower lobe. No LEFT upper lobe filling defects identified.  No filling defect identified within the RIGHT lung pulmonary arteries. No evidence of RIGHT ventricular strain with the RV to LV ratio less than 1. Coronary artery calcification and aortic atherosclerotic calcification. Mediastinum/Nodes: No axillary or supraclavicular adenopathy. No mediastinal or hilar adenopathy. No pericardial fluid. Esophagus normal. Lungs/Pleura: There is diffuse very fine airspace disease within the LEFT lower lobe involving the majority of the LEFT lower lobe. Similar milder findings in the posterior RIGHT lower lobe and lingula. Upper Abdomen: Limited view of the liver, kidneys, pancreas are unremarkable. Normal adrenal glands. Musculoskeletal: No  aggressive osseous lesion. Review of the MIP images confirms the above findings. IMPRESSION: 1. Large occlusive pulmonary embolism within the proximal LEFT lower lobe pulmonary artery and lingular pulmonary artery. 2. Diffuse fine airspace disease within the LEFT lower lobe with differential including pulmonary infarction, pulmonary hemorrhage, or pneumonia (including COVID pneumonia). 3. No evidence of RIGHT ventricular strain. 4. Coronary artery calcification and aortic atherosclerotic calcification. Critical Value/emergent results were called by telephone at the time of interpretation on 05/21/2020 at 6:55 pm to provider [facility] , who verbally acknowledged these results. Electronically Signed: By: Genevive Bi M.D. On: 05/21/2020 18:59   IR IVC FILTER PLMT / S&I Lenise Arena GUID/MOD SED  Result Date: 05/24/2020 INDICATION: 69 year old with recent cardiac arrest likely secondary to pulmonary embolism and COVID-19. Subsequent right ventricular failure. Patient has clot in the left pulmonary arteries and clot in left femoral vein. There is concern that the patient would not tolerate additional clot burden in the pulmonary arteries and request for IVC filter placement. EXAM: IVC FILTER PLACEMENT; IVC VENOGRAM; ULTRASOUND FOR VASCULAR ACCESS Physician: Rachelle Hora. Lowella Dandy, MD MEDICATIONS: None. ANESTHESIA/SEDATION: Versed 1 mg The patient was continuously monitored during the procedure by the interventional radiology nurse under my direct supervision. CONTRAST:  Carbon dioxide FLUOROSCOPY TIME:  Fluoroscopy Time: 3 minutes, 18 seconds, 18 mGy COMPLICATIONS: None immediate. PROCEDURE: Informed consent was obtained for an IVC filter placement from the patient's daughter. Ultrasound demonstrated a patent right internal jugular vein. Ultrasound images were obtained for documentation. The right neck was prepped and draped in a sterile fashion. Maximal barrier sterile technique was utilized including caps, mask, sterile  gowns, sterile gloves, sterile drape, hand hygiene and skin antiseptic. The skin was anesthetized with 1% lidocaine. A 21 gauge needle was directed into the vein with ultrasound guidance and a micropuncture dilator set was placed. A wire was advanced into the IVC. The filter sheath was advanced over the wire into the IVC. An IVC venogram was performed with carbon dioxide. Bilateral renal veins were cannulated using a 5 French catheter and Bentson wire. Bilateral renal veins are identified. Fluoroscopic images were obtained for documentation. A Bard Denali filter was deployed below the lowest renal vein. Post placement images of the filter were obtained. The vascular sheath was removed with manual compression. Bandage placed over the puncture site. FINDINGS: IVC was patent. Bilateral renal veins were identified. The filter was deployed below the lowest renal vein. IMPRESSION: Successful placement of a retrievable IVC filter. PLAN: This IVC filter is potentially retrievable. The patient will be assessed for filter retrieval by Interventional Radiology in approximately 8-12 weeks. Further recommendations regarding filter retrieval, continued surveillance or declaration of device permanence, will be made at that time. Electronically Signed   By: Richarda Overlie M.D.   On: 05/24/2020 17:57   DG CHEST PORT 1 VIEW  Result Date: 05/26/2020 CLINICAL DATA:  Acute respiratory failure.  Hypoxia. EXAM: PORTABLE CHEST 1 VIEW COMPARISON:  05/22/2020. FINDINGS: Interim extubation and removal of NG tube. Left IJ line in stable position. Heart size normal. Diffuse left lung infiltrate. Right base infiltrate. No prominent pleural effusion. No pneumothorax. IMPRESSION: 1. Interim extubation and removal of NG tube. Left IJ line stable position. 2. Diffuse left lung infiltrate and right base infiltrate. Electronically Signed   By: Maisie Fus  Register   On: 05/26/2020 05:50   DG CHEST PORT 1 VIEW  Result Date: 05/22/2020 CLINICAL DATA:   Central line placement, intubation EXAM: PORTABLE CHEST 1 VIEW COMPARISON:  05/21/2020 FINDINGS: Left central line has been placed with the tip in the SVC. No pneumothorax. Endotracheal tube is 5 cm above the carina. Patchy airspace disease throughout the left lung. No focal opacity on the right. Heart is normal size. IMPRESSION: Endotracheal tube 5 cm above the carina. Left central line tip in the SVC. No pneumothorax. Stable patchy left lung airspace disease. Electronically Signed   By: Charlett Nose M.D.   On: 05/22/2020 21:06   DG Chest Port 1 View  Result Date: 05/21/2020 CLINICAL DATA:  Cough, lower back pain, has not eaten 5 days, emesis EXAM: PORTABLE CHEST 1 VIEW COMPARISON:  Radiograph 04/19/2010, CT 04/18/2010 FINDINGS: Heterogeneous opacities present predominantly along the periphery of the left lung and minimally in the right lung base as well. No pneumothorax or effusion. The aorta is calcified. The remaining cardiomediastinal contours are unremarkable. No acute osseous or soft tissue abnormality. Degenerative changes are present in the imaged spine and shoulders. Telemetry leads overlie the chest. IMPRESSION: Heterogeneous opacities throughout the lungs, greatest in the left lung periphery concerning for pneumonia including potential viral etiology. Aortic Atherosclerosis (ICD10-I70.0). Electronically Signed   By: Kreg Shropshire M.D.   On: 05/21/2020 03:50   DG Abd Portable 1V  Result Date: 05/23/2020 CLINICAL DATA:  Encounter for orogastric tube placement. EXAM: PORTABLE ABDOMEN - 1 VIEW COMPARISON:  06/09/2009 and chest radiograph 05/22/2020 FINDINGS: Gastric tube extends into the abdomen and the tip is in the midline of the lower abdomen, likely in the distal stomach body region. Again noted are patchy densities at the left lung base. Few gas-filled loops of bowel in the abdomen. IMPRESSION: Orogastric tube in the lower abdominal midline and likely in the distal stomach body region.  Electronically Signed   By: Richarda Overlie M.D.   On: 05/23/2020 12:17   ECHOCARDIOGRAM COMPLETE  Result Date: 05/23/2020    ECHOCARDIOGRAM REPORT   Patient Name:   JARRELL ARMOND Date of Exam: 05/23/2020 Medical Rec #:  [id number]      Height:       72.0 in Accession #:    [id number]     Weight:       142.4 lb Date of Birth:  03-Dec-1950      BSA:          1.844 m Patient Age:    69 years       BP:           112/85 mmHg Patient Gender: M              HR:           96 bpm. Exam Location:  Inpatient Procedure: 2D Echo, Cardiac Doppler and Color Doppler STAT ECHO Indications:    I26.02 Pulmonary embolus  History:        Patient has no prior history of Echocardiogram examinations.                 Arrythmias:Cardiac  Arrest; Signs/Symptoms:Dyspnea, Hypotension                 and Altered Mental Status. Covid 19 positive.  Sonographer:    Sheralyn Boatman RDCS Referring Phys: [id number] Martina Sinner  Sonographer Comments: Technically difficult study due to poor echo windows and echo performed with patient supine and on artificial respirator. IMPRESSIONS  1. Severe RV failure and cor pulmonale.  2. Abnormal septal motion and septal flattening consistent with RV pressure overload. Left ventricular ejection fraction, by estimation, is 50 to 55%. The left ventricle has low normal function. The left ventricle has no regional wall motion abnormalities. Left ventricular diastolic parameters were normal.  3. Right ventricular systolic function is severely reduced. The right ventricular size is severely enlarged. There is mildly elevated pulmonary artery systolic pressure.  4. The mitral valve is normal in structure. No evidence of mitral valve regurgitation. No evidence of mitral stenosis.  5. The aortic valve is normal in structure. Aortic valve regurgitation is not visualized. No aortic stenosis is present.  6. The inferior vena cava is dilated in size with <50% respiratory variability, suggesting right atrial pressure of 15 mmHg.  FINDINGS  Left Ventricle: Abnormal septal motion and septal flattening consistent with RV pressure overload. Left ventricular ejection fraction, by estimation, is 50 to 55%. The left ventricle has low normal function. The left ventricle has no regional wall motion abnormalities. The left ventricular internal cavity size was normal in size. There is no left ventricular hypertrophy. Left ventricular diastolic parameters were normal. Right Ventricle: The right ventricular size is severely enlarged. Right vetricular wall thickness was not assessed. Right ventricular systolic function is severely reduced. There is mildly elevated pulmonary artery systolic pressure. The tricuspid regurgitant velocity is 2.67 m/s, and with an assumed right atrial pressure of 8 mmHg, the estimated right ventricular systolic pressure is 36.5 mmHg. Left Atrium: Left atrial size was normal in size. Right Atrium: Right atrial size was normal in size. Pericardium: There is no evidence of pericardial effusion. Mitral Valve: The mitral valve is normal in structure. No evidence of mitral valve regurgitation. No evidence of mitral valve stenosis. Tricuspid Valve: The tricuspid valve is normal in structure. Tricuspid valve regurgitation is mild . No evidence of tricuspid stenosis. Aortic Valve: The aortic valve is normal in structure. Aortic valve regurgitation is not visualized. No aortic stenosis is present. Pulmonic Valve: The pulmonic valve was normal in structure. Pulmonic valve regurgitation is not visualized. No evidence of pulmonic stenosis. Aorta: The aortic root is normal in size and structure. Venous: The inferior vena cava is dilated in size with less than 50% respiratory variability, suggesting right atrial pressure of 15 mmHg. IAS/Shunts: No atrial level shunt detected by color flow Doppler. Additional Comments: Severe RV failure and cor pulmonale.  LEFT VENTRICLE PLAX 2D LVIDd:         3.70 cm     Diastology LVIDs:         2.80 cm      LV e' medial:    4.46 cm/s LV PW:         1.10 cm     LV E/e' medial:  4.9 LV IVS:        1.10 cm     LV e' lateral:   6.97 cm/s LVOT diam:     2.10 cm     LV E/e' lateral: 3.1 LV SV:         28 LV SV Index:   15 LVOT  Area:     3.46 cm  LV Volumes (MOD) LV vol d, MOD A2C: 22.6 ml LV vol d, MOD A4C: 25.5 ml LV vol s, MOD A2C: 11.1 ml LV vol s, MOD A4C: 11.7 ml LV SV MOD A2C:     11.5 ml LV SV MOD A4C:     25.5 ml LV SV MOD BP:      13.7 ml RIGHT VENTRICLE            IVC RV S prime:     8.84 cm/s  IVC diam: 2.50 cm TAPSE (M-mode): 1.3 cm LEFT ATRIUM           Index      RIGHT ATRIUM           Index LA diam:      2.80 cm 1.52 cm/m RA Area:     14.30 cm LA Vol (A4C): 10.5 ml 5.69 ml/m RA Volume:   42.70 ml  23.15 ml/m  AORTIC VALVE LVOT Vmax:   53.80 cm/s LVOT Vmean:  41.900 cm/s LVOT VTI:    0.080 m  AORTA Ao Root diam: 3.60 cm Ao Asc diam:  3.50 cm MITRAL VALVE               TRICUSPID VALVE MV Area (PHT): 3.27 cm    TR Peak grad:   28.5 mmHg MV Decel Time: 232 msec    TR Vmax:        267.00 cm/s MV E velocity: 21.94 cm/s MV A velocity: 41.30 cm/s  SHUNTS MV E/A ratio:  0.53        Systemic VTI:  0.08 m                            Systemic Diam: 2.10 cm Charlton Haws MD Electronically signed by Charlton Haws MD Signature Date/Time: 05/23/2020/10:46:38 AM    Final    CT RENAL STONE STUDY  Result Date: 05/21/2020 CLINICAL DATA:  69 year old male with low back pain, flank pain, decreased p.o. EXAM: CT ABDOMEN AND PELVIS WITHOUT CONTRAST TECHNIQUE: Multidetector CT imaging of the abdomen and pelvis was performed following the standard protocol without IV contrast. COMPARISON:  Noncontrast CT Abdomen and Pelvis 10/28/2008. Portable chest radiograph 04/19/2010. FINDINGS: Lower chest: Patchy, irregular peribronchial and peripheral scattered pulmonary opacity at both lung bases. This seems to be new from last month. Underlying probable pulmonary hyperinflation. Some associated lung base bronchiectasis. No cavitating  areas identified. No cardiomegaly, pericardial effusion or pleural effusion. Hepatobiliary: Negative noncontrast liver and gallbladder. Pancreas: Negative. Spleen: Negative. Adrenals/Urinary Tract: Bulky left nephrolithiasis measuring 13 mm at the renal pelvis. Additional left lower pole stone. No hydronephrosis. Negative noncontrast right kidney. No hydroureter. Unremarkable urinary bladder. Incidental pelvic phleboliths. Stomach/Bowel: Decompressed large bowel. There is long segment circumferential wall thickening in the right colon, up to the 12 mm series 4, image 49) with some areas of adjacent mesenteric stranding (coronal image 43). The wall thickening gradually decreases through the transverse colon. No free air. No free fluid. The terminal ileum appear spared. Appendix seems to remain normal on coronal image 32. No dilated small bowel.  Decompressed stomach and duodenum. Vascular/Lymphatic: Normal caliber abdominal aorta. Mild calcified atherosclerosis. Vascular patency is not evaluated in the absence of IV contrast. No lymphadenopathy identified in the absence of contrast. Reproductive: Negative. Other: Previous lower abdominal ventral hernia repair with mesh. No pelvic free fluid. Musculoskeletal: Degenerative changes in the spine. No acute osseous  abnormality identified. IMPRESSION: 1. In the abdomen there is circumferential bowel wall thickening and mesenteric stranding involving the right colon. Wall thickening gradually abates through the transverse colon. This is compatible with Acute Nonspecific Colitis. 2. But the lung bases are also abnormal with patchy and irregular peribronchial and peripheral pulmonary opacity suspicious for acute viral/atypical respiratory infection. No areas of cavitation to suggest septic emboli. No pleural effusion. 3. Bulky left nephrolithiasis, but no obstructive uropathy. 4. Previous lower abdominal hernia repair with mesh. Electronically Signed   By: Odessa Fleming M.D.   On:  05/21/2020 02:46   VAS [place] LOWER EXTREMITY VENOUS (DVT)  Result Date: 05/24/2020  Lower Venous DVT Study Indications: Covid-19, pulmonary embolism.  Risk Factors: Confirmed PE. Anticoagulation: Heparin. Limitations: Body habitus, ventilation, did not perform compressions in thigh and popliteal fossa of the left lower extremity secondary to mobile thrombus, bandages and line. Comparison Study: No prior study Performing Technologist: Sherren Kerns RVS  Examination Guidelines: A complete evaluation includes B-mode imaging, spectral Doppler, color Doppler, and power Doppler as needed of all accessible portions of each vessel. Bilateral testing is considered an integral part of a complete examination. Limited examinations for reoccurring indications may be performed as noted. The reflux portion of the exam is performed with the patient in reverse Trendelenburg.  +---------+---------------+---------+-----------+----------+--------------+ RIGHT    CompressibilityPhasicitySpontaneityPropertiesThrombus Aging +---------+---------------+---------+-----------+----------+--------------+ CFV      Full           Yes      No                                  +---------+---------------+---------+-----------+----------+--------------+ SFJ      Full                                                        +---------+---------------+---------+-----------+----------+--------------+ FV Prox  Full                                                        +---------+---------------+---------+-----------+----------+--------------+ FV Mid   Full                                                        +---------+---------------+---------+-----------+----------+--------------+ FV DistalFull                                                        +---------+---------------+---------+-----------+----------+--------------+ PFV      Full                                                         +---------+---------------+---------+-----------+----------+--------------+ POP  Full           Yes      No                                  +---------+---------------+---------+-----------+----------+--------------+ PTV      Full                                                        +---------+---------------+---------+-----------+----------+--------------+ PERO     Full                                                        +---------+---------------+---------+-----------+----------+--------------+   +---------+---------------+---------+-----------+----------+-------------------+ LEFT     CompressibilityPhasicitySpontaneityPropertiesThrombus Aging      +---------+---------------+---------+-----------+----------+-------------------+ CFV                                                   not visualized                                                            secondary to                                                              bandage/line        +---------+---------------+---------+-----------+----------+-------------------+ SFJ                                                   not visualized                                                            secondary to                                                              bandage/line        +---------+---------------+---------+-----------+----------+-------------------+ FV Prox  Full  patent by color     +---------+---------------+---------+-----------+----------+-------------------+ FV Mid                                                patent by color     +---------+---------------+---------+-----------+----------+-------------------+ FV Distal                                             patent by color     +---------+---------------+---------+-----------+----------+-------------------+ PFV                                                    patent by color     +---------+---------------+---------+-----------+----------+-------------------+ POP                                                   patent by color     +---------+---------------+---------+-----------+----------+-------------------+ PTV      Full                                                             +---------+---------------+---------+-----------+----------+-------------------+ PERO     Full                                                             +---------+---------------+---------+-----------+----------+-------------------+     Summary: RIGHT: - There is no evidence of deep vein thrombosis in the lower extremity.  vessels are dilated with significantly sluggish flow, throughout  LEFT: - Findings consistent with acute deep vein thrombosis involving the left femoral vein. - Acute, mobile thrombus noted in the proximal and mid femoral vein. Vessels are dilated with significantly sluggish flow, throughout.  *See table(s) above for measurements and observations. Electronically signed by Waverly Ferrari MD on 05/24/2020 at 6:21:22 PM.    Final

## 2020-06-08 NOTE — Progress Notes (Signed)
Physical Therapy Treatment Patient Details Name: Dwayne Barnes MRN: [id number] DOB: 14-Jun-1951 Today's Date: 06/08/2020    History of Present Illness Pt is a 69 y.o. male admitted 05/20/20 with back pain, weakness and colitis; found to have (+) COVID-19 and LLL PE. Pt with 2x cardiac arrests on 11/19 and intubated; self-exubated 11/22. 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 Head CT 11/23 unremarkable. S/p femoral line removal 11/24. Course complicated by progressive diarrhea; abdominal CT 11/29 with progressive colitis. PMH includes HOH.    PT Comments    Patient very HOH and did best with writing down instructions/questions. Ambulated in room twice with min assist (mild imbalance with pt reaching to hold onto IV pole) on room air with sats >90% and max HR 123 bpm. Patient with incr RR, but denies feeling short of breath. Extra time on sit to stand transfers for strengthening and balance.     Follow Up Recommendations  SNF;Supervision for mobility/OOB     Equipment Recommendations  3in1 (PT);Rolling walker with 5" wheels    Recommendations for Other Services       Precautions / Restrictions Precautions Precautions: Fall Precaution Comments: Extremely HOH; bowel incontinence with mobility    Mobility  Bed Mobility                  Transfers Overall transfer level: Needs assistance Equipment used: None Transfers: Sit to/from Stand;Stand Pivot Transfers Sit to Stand: Min assist Stand pivot transfers: Min assist       General transfer comment: no lifting assist needed; slight steadying initially with posterior bias; improved with repetition  Ambulation/Gait Ambulation/Gait assistance: Min assist Gait Distance (Feet): 50 Feet (seated rest, 50) Assistive device: IV Pole Gait Pattern/deviations: Step-through pattern;Decreased stride length;Wide base of support     General Gait Details: slow due to dyspnea and elevated HR (max 123 bpm); pt reaching for UE  support and held IV pole (with PT) majority of walk   Stairs             Wheelchair Mobility    Modified Rankin (Stroke Patients Only)       Balance Overall balance assessment: Needs assistance   Sitting balance-Leahy Scale: Fair       Standing balance-Leahy Scale: Poor Standing balance comment: Reliant on external assist and at least single UE support to maintain balance; dependent for posterior pericare                            Cognition Arousal/Alertness: Awake/alert Behavior During Therapy: WFL for tasks assessed/performed Overall Cognitive Status: Difficult to assess                                 General Comments: Difficult to assess due to very HOH. Following majority of simple gesutural and verbal commands appropriately; appropriate conversation      Exercises      General Comments        Pertinent Vitals/Pain Pain Assessment: No/denies pain    Home Living                      Prior Function            PT Goals (current goals can now be found in the care plan section) Acute Rehab PT Goals Patient Stated Goal: not stated Time For Goal Achievement: 06/10/20 Potential to Achieve  Goals: Good Progress towards PT goals: Progressing toward goals    Frequency    Min 2X/week      PT Plan Current plan remains appropriate    Co-evaluation              AM-PAC PT "6 Clicks" Mobility   Outcome Measure  Help needed turning from your back to your side while in a flat bed without using bedrails?: A Little Help needed moving from lying on your back to sitting on the side of a flat bed without using bedrails?: A Little Help needed moving to and from a bed to a chair (including a wheelchair)?: A Little Help needed standing up from a chair using your arms (e.g., wheelchair or bedside chair)?: A Little Help needed to walk in hospital room?: A Little Help needed climbing 3-5 steps with a railing? : A Lot 6  Click Score: 17    End of Session   Activity Tolerance: Patient tolerated treatment well Patient left: in chair;with call bell/phone within reach Nurse Communication: Mobility status;Other (comment) (condom catheter came off; +BM (continent)) PT Visit Diagnosis: Muscle weakness (generalized) (M62.81);Difficulty in walking, not elsewhere classified (R26.2)     Time: 9480-1655 PT Time Calculation (min) (ACUTE ONLY): 37 min  Charges:  $Gait Training: 8-22 mins $Therapeutic Activity: 8-22 mins                      Jerolyn Center, PT Pager [phone]    Zena Amos 06/08/2020, 1:22 PM

## 2020-06-08 NOTE — Progress Notes (Addendum)
Nutrition Follow-up  DOCUMENTATION CODES:   Underweight  INTERVENTION:  Provide Ensure Enlive po TID, each supplement provides 350 kcal and 20 grams of protein.  Encourage adequate PO intake.   NUTRITION DIAGNOSIS:   Increased nutrient needs related to acute illness (COVID-19) as evidenced by estimated needs; ongoing  GOAL:   Patient will meet greater than or equal to 90% of their needs; progressing  MONITOR:   PO intake, Supplement acceptance, Diet advancement, Skin, Weight trends, I & O's, Labs  REASON FOR ASSESSMENT:   Malnutrition Screening Tool    ASSESSMENT:   69 yo male with no known PMH presents with N/V, weakness and back and admitted with COVID 19 pneumonia on 11/18, had PEA arrest on 11/19 and required intubation.   11/18 Admitted 11/19 PEA arrest, Intubated 11/23 self-extubated; agitated requiring Haldol 11/24 Cortrak placed (gastric tip) 11/25 Diet advanced to Dysphagia 1 with Honey Thick Liquids 11/26 Tube feeds discontinued, Cortrak NGT out    Pt continues on a dysphagia 3 diet with thin liquids. Meal completion has been 60-100%. Pt currently has Ensure ordered and has been consuming them. RD to continue with current orders to aid in caloric and protein needs. Pt encouraged to eat his food at meals and to drink his supplements. Pt with c. Diff colitis. Diarrhea improved. Per MD, plans for SNF when stools are more formed.   Diet Order:   Diet Order            DIET DYS 3 Room service appropriate? No; Fluid consistency: Thin; Fluid restriction: 1200 mL Fluid  Diet effective now                 EDUCATION NEEDS:   No education needs have been identified at this time  Skin:  Skin Assessment: Reviewed RN Assessment Skin Integrity Issues:: Other (Comment) Other: puncture R throat  Last BM:  12/6  Height:   Ht Readings from Last 1 Encounters:  05/22/20 6' (1.829 m)    Weight:   Wt Readings from Last 1 Encounters:  06/08/20 59 kg     Ideal Body Weight:  80.9 kg  BMI:  Body mass index is 17.64 kg/m.  Estimated Nutritional Needs:   Kcal:  2778-2423 kcals  Protein:  95-130 g  Fluid:  1.2 L/day  Roslyn Smiling, MS, RD, LDN RD pager number/after hours weekend pager number on Amion.

## 2020-06-08 NOTE — TOC Progression Note (Addendum)
Transition of Care [facility]) - Progression Note    Patient Details  Name: Dwayne Barnes MRN: [id number] Date of Birth: 02/12/1951  [facility] CM/SW Contact  Mearl Latin, LCSW Phone Number: 06/08/2020, 8:56 AM  Clinical Narrative:    CSW reached out to [facility] to see when they can accept patient; per liaison, patient can discharge once his PO Vancomycin is complete and stools are formed as they do not have a private room for patient.    Expected Discharge Plan: Skilled Nursing Facility Barriers to Discharge: Continued Medical Work up, English as a second language teacher, SNF Pending bed offer  Expected Discharge Plan and Services Expected Discharge Plan: Skilled Nursing Facility In-house Referral: Clinical Social Work Discharge Planning Services: CM Consult Post Acute Care Choice: Skilled Nursing Facility Living arrangements for the past 2 months: Single Family Home                                       Social Determinants of Health (SDOH) Interventions    Readmission Risk Interventions No flowsheet data found.

## 2020-06-08 NOTE — Progress Notes (Signed)
Patient has had one (medium-sized) bowel movement throughout the shift (type 6) brown in color.

## 2020-06-09 DIAGNOSIS — U071 COVID-19: Secondary | ICD-10-CM | POA: Diagnosis not present

## 2020-06-09 LAB — CBC WITH DIFFERENTIAL/PLATELET
Abs Immature Granulocytes: 0.17 10*3/uL — ABNORMAL HIGH (ref 0.00–0.07)
Basophils Absolute: 0 10*3/uL (ref 0.0–0.1)
Basophils Relative: 0 %
Eosinophils Absolute: 0.2 10*3/uL (ref 0.0–0.5)
Eosinophils Relative: 2 %
HCT: 25.4 % — ABNORMAL LOW (ref 39.0–52.0)
Hemoglobin: 8.1 g/dL — ABNORMAL LOW (ref 13.0–17.0)
Immature Granulocytes: 2 %
Lymphocytes Relative: 13 %
Lymphs Abs: 1.2 10*3/uL (ref 0.7–4.0)
MCH: 28.1 pg (ref 26.0–34.0)
MCHC: 31.9 g/dL (ref 30.0–36.0)
MCV: 88.2 fL (ref 80.0–100.0)
Monocytes Absolute: 0.7 10*3/uL (ref 0.1–1.0)
Monocytes Relative: 8 %
Neutro Abs: 7.1 10*3/uL (ref 1.7–7.7)
Neutrophils Relative %: 75 %
Platelets: 452 10*3/uL — ABNORMAL HIGH (ref 150–400)
RBC: 2.88 MIL/uL — ABNORMAL LOW (ref 4.22–5.81)
RDW: 14.4 % (ref 11.5–15.5)
WBC: 9.4 10*3/uL (ref 4.0–10.5)
nRBC: 0 % (ref 0.0–0.2)

## 2020-06-09 LAB — MAGNESIUM: Magnesium: 1.9 mg/dL (ref 1.7–2.4)

## 2020-06-09 LAB — GLUCOSE, CAPILLARY
Glucose-Capillary: 99 mg/dL (ref 70–99)
Glucose-Capillary: 108 mg/dL — ABNORMAL HIGH (ref 70–99)
Glucose-Capillary: 143 mg/dL — ABNORMAL HIGH (ref 70–99)
Glucose-Capillary: 110 mg/dL — ABNORMAL HIGH (ref 70–99)
Glucose-Capillary: 105 mg/dL — ABNORMAL HIGH (ref 70–99)

## 2020-06-09 LAB — COMPREHENSIVE METABOLIC PANEL
ALT: 78 U/L — ABNORMAL HIGH (ref 0–44)
AST: 46 U/L — ABNORMAL HIGH (ref 15–41)
Albumin: 1.4 g/dL — ABNORMAL LOW (ref 3.5–5.0)
Alkaline Phosphatase: 182 U/L — ABNORMAL HIGH (ref 38–126)
Anion gap: 9 (ref 5–15)
BUN: 10 mg/dL (ref 8–23)
CO2: 24 mmol/L (ref 22–32)
Calcium: 7.7 mg/dL — ABNORMAL LOW (ref 8.9–10.3)
Chloride: 97 mmol/L — ABNORMAL LOW (ref 98–111)
Creatinine, Ser: 1.09 mg/dL (ref 0.61–1.24)
GFR, Estimated: >60 mL/min (ref >60)
Glucose, Bld: 142 mg/dL — ABNORMAL HIGH (ref 70–99)
Potassium: 3.7 mmol/L (ref 3.5–5.1)
Sodium: 130 mmol/L — ABNORMAL LOW (ref 135–145)
Total Bilirubin: 0.6 mg/dL (ref 0.3–1.2)
Total Protein: 4.3 g/dL — ABNORMAL LOW (ref 6.5–8.1)

## 2020-06-09 MED ORDER — INSULIN ASPART 100 UNIT/ML ~~LOC~~ SOLN: 0.0000 [IU] | Freq: Three times a day (TID) | SUBCUTANEOUS | Status: DC

## 2020-06-09 MED ORDER — INSULIN ASPART 100 UNIT/ML ~~LOC~~ SOLN
0.0000 [IU] | Freq: Three times a day (TID) | SUBCUTANEOUS | Status: DC
  Administered 2020-06-09 – 2020-06-13 (×4): 1 [IU] via SUBCUTANEOUS

## 2020-06-09 NOTE — Progress Notes (Signed)
PROGRESS NOTE                                                                                                                                                                                                             Patient Demographics:    Dwayne Barnes, is a 69 y.o. male, DOB - 1951/01/24, ZOX:[id number]  Outpatient Primary MD for the patient is [name], PA-C   Admit date - 05/20/2020   LOS - 19  Chief Complaint  Patient presents with  . Fatigue       Brief Narrative: Patient is a 69 y.o. male with no significant past medical history-presented to the ED on 11/17 with nausea/vomiting, weakness and back pain-further evaluation revealed COVID-19 infection and a large PE.  Patient was started on IV heparin infusion along with steroid/Remdesivir-on 11/19-patient had a cardiac arrest-was intubated during the code-total downtime of around 6 minutes before ROSC.  Patient was subsequently transferred to the ICU-unfortunately soon after arrival in the ICU-he had another cardiac arrest-and 4 rounds of CPR were provided with ROSC.  Patient was provided supportive care-he subsequently improved-self extubated on 11/23-he was [facility] on 11/25.  Further hospital course complicated by diarrhea secondary to C. difficile along with AKI and hyponatremia.See below for further details.   COVID-19 vaccinated status: Vaccinated  Significant Events: 11/17>> Admit to [facility] for PE/COVID-19 infection 11/19>> PEA cardiac arrest x 2-given TPA-intubated-transfer to ICU 11/23>> Self extubated 11/25>> transfer to [facility] 11/29>> worsening diarrhea-CT abdomen with progressive colitis  Significant studies: 11/18>>CT renal stone: non specific colitis, GGO's, left nephrolithiasis without obstructive uropathy. 11/18>>CTA chest: large PC in LLL, GGO's LLL. 11/20 echo>> EF 50-55%, RV systolic function severely  reduced. 11/21>> B/L extremity Doppler: acute DVTin left femoral vein, acute mobile thrombus in the proximal femoral vein. 11/23>>CT Head: unremarkable   11/29>> CT abdomen/pelvis>> progressive colitis  COVID-19 medications: Steroids: 11/17>>11/26 Remdesivir: 11/17>> 11/22  Antibiotics: Oral vancomycin: 11/30>>  Microbiology data: 11/18>> urine culture: Insignificant growth 11/30>> stool C. difficile: +ve 11/30>> GI pathogen panel: negative  Procedures: ETT>>11/19 - 11/23 IVC filter placement by IR>>11/21.  Consults: PCCM, IR  DVT prophylaxis: Therapeutic Lovenox>> Eliquis    Subjective:   Patient in bed, appears comfortable, denies any headache, no fever, no chest pain or pressure, no shortness of breath , no abdominal pain. Improving  diarrhea.   Assessment  & Plan :   Cardiac arrest due to large PE: S/p TPA-was on therapeutic Lovenox-we will transition to Eliquis today. PE likely provoked by COVID-19 infection.  PCCM MD discussed with Dr. Illa LevelBensimhon-no indication for any acute intervention.  Continue telemetry monitoring.  Echo as above.  Large PE with DVT: Continue therapeutic anticoagulation-he is s/p TPA on 11/19 when he had cardiac arrest.  Patient is s/p IVC filter given mobile thrombus seen on lower extremity Doppler.  Once oral intake was stabilized-he was transitioned to Eliquis.  Acute hypoxic respiratory failure: Due to PE/cardiac arrest/COVID-19 pneumonia-self extubated on 11/23-currently stable on room air.  Acute metabolic encephalopathy/ICU delirium/possible anoxic injury: Mental status continues to slowly improve-getting more fluent with responses.  CT head without acute changes.  Continue supportive care.  Minimize all sedating medications.    Dysphagia: In the setting of cardiac arrest-seen by SLP-required NG tube-subsequently seen by SLP-diet gradually upgraded to a dysphagia 3 diet.  C. difficile colitis: Diarrhea improving-leukocytosis/AKI all getting  better.  Continue oral vancomycin.  AKI: Likely hemodynamically mediated-due to diarrhea-has resolved with IV fluids and improvement in diarrhea.  COVID-19 pneumonia: Completed a course of Remdesivir-an steroids.  Hypoxia has resolved.   Paroxysmal atrial fibrillation: appears to be back in sinus rhythm-on anticoagulation for PE.  Echo as above.    Normocytic anemia: Secondary to critical illness-stable for monitoring-no indication for PRBC transfusion.  Transaminitis: Likely secondary to COVID-19-downtrending.  Deconditioning/debility: Secondary to critical illness- PT/OT eval completed-recommendations are for SNF, SNF does not want him at the SNF till his stools are formed and C. difficile has clinically resolved.  Monitoring closely.  Hard of hearing - supportive care.  Hyponatremia.  Persistent.  Became hypotensive 06/08/2020 hence IV fluids, other electrolytes including low serum uric acid point more towards SIADH but due to hypotension requires fluids.  Will monitor.    GI prophylaxis: H2 blocker.  Condition - Extremely Guarded  Family Communication  :  Daughter [name]([name] [phone])  updated over the phone 06/05/20 by previous MD.  I called on 06/06/2020, 06/09/20  Code Status :  Full Code  Diet :  Diet Order            DIET DYS 3 Room service appropriate? No; Fluid consistency: Thin; Fluid restriction: 1200 mL Fluid  Diet effective now                  Disposition Plan  :  Status is: Inpatient  Remains inpatient appropriate because:Inpatient level of care appropriate due to severity of illness   Dispo: The patient is from: Home              Anticipated d/c is to: SNF              Anticipated d/c date is: > 1 day              Patient currently is not medically stable to d/c.   Barriers to discharge: Persistent hyponatremia  Antimicorbials  :    Anti-infectives (From admission, onward)   Start     Dose/Rate Route Frequency Ordered Stop   06/02/20 1000   vancomycin (VANCOCIN) 50 mg/mL oral solution 125 mg        125 mg Oral 4 times daily 06/02/20 0800 06/12/20 0959   05/22/20 1000  remdesivir 100 mg in sodium chloride 0.9 % 100 mL IVPB       "Followed by" Linked Group Details   100 mg 200  mL/hr over 30 Minutes Intravenous Daily 05/21/20 0857 05/25/20 1154   05/21/20 1030  remdesivir 200 mg in sodium chloride 0.9% 250 mL IVPB  Status:  Discontinued       "Followed by" Linked Group Details   200 mg 580 mL/hr over 30 Minutes Intravenous Once 05/21/20 0857 05/27/20 0734   05/21/20 0330  metroNIDAZOLE (FLAGYL) tablet 500 mg        500 mg Oral  Once 05/21/20 0320 05/21/20 0411   05/20/20 2330  cefTRIAXone (ROCEPHIN) 2 g in sodium chloride 0.9 % 100 mL IVPB        2 g 200 mL/hr over 30 Minutes Intravenous  Once 05/20/20 2324 05/21/20 0331      Inpatient Medications  Scheduled Meds: . apixaban  5 mg Oral BID  . Chlorhexidine Gluconate Cloth  6 each Topical Daily  . famotidine  20 mg Oral Daily  . feeding supplement  237 mL Oral TID BM  . insulin aspart  0-9 Units Subcutaneous TID AC & HS  . sodium chloride flush  10-40 mL Intracatheter Q12H  . vancomycin  125 mg Oral QID   Continuous Infusions:  PRN Meds:.acetaminophen (TYLENOL) oral liquid 160 mg/5 mL, [DISCONTINUED] ondansetron **OR** ondansetron (ZOFRAN) IV   Time Spent in minutes  25   See all Orders from today for further details   Susa Raring M.D on 06/09/2020 at 9:48 AM  To page go to [email or web address] - use universal password  [facility] -  Office  [phone]    Objective:   Vitals:   06/08/20 1456 06/08/20 1957 06/09/20 0334 06/09/20 0500  BP: 139/67 (!) 104/56 106/64   Pulse: 88 97 83   Resp: 20 20 17    Temp: 97.8 F (36.6 C) (!) 100.9 F (38.3 C) 97.8 F (36.6 C)   TempSrc: Oral Oral Oral   SpO2: 97% 98% 98%   Weight:    58.8 kg  Height:        Wt Readings from Last 3 Encounters:  06/09/20 58.8 kg  12/20/14 66 kg     Intake/Output Summary  (Last 24 hours) at 06/09/2020 0948 Last data filed at 06/09/2020 0944 Gross per 24 hour  Intake 3439.29 ml  Output 2850 ml  Net 589.29 ml     Physical Exam  Awake Alert, No new F.N deficits, Normal affect, extremely hard of hearing [place].AT,PERRAL Supple Neck,No JVD, No cervical lymphadenopathy appriciated.  Symmetrical Chest wall movement, Good air movement bilaterally, CTAB RRR,No Gallops, Rubs or new Murmurs, No Parasternal Heave +ve B.Sounds, Abd Soft, No tenderness, No organomegaly appriciated, No rebound - guarding or rigidity. No Cyanosis, Clubbing or edema, No new Rash or bruise     Data Review:    CBC Recent Labs  Lab 06/05/20 0316 06/06/20 0520 06/07/20 0500 06/08/20 0514 06/09/20 0054  WBC 10.7* 8.2 8.1 9.4 9.4  HGB 9.9* 9.4* 9.6* 9.2* 8.1*  HCT 30.0* 28.5* 29.6* 28.5* 25.4*  PLT 387 430* 490* 510* 452*  MCV 87.7 87.2 87.3 87.4 88.2  MCH 28.9 28.7 28.3 28.2 28.1  MCHC 33.0 33.0 32.4 32.3 31.9  RDW 14.5 14.4 14.4 14.2 14.4  LYMPHSABS  --   --  0.8 1.2 1.2  MONOABS  --   --  0.7 0.7 0.7  EOSABS  --   --  0.1 0.1 0.2  BASOSABS  --   --  0.0 0.0 0.0    Chemistries  Recent Labs  Lab 06/05/20 0316 06/06/20 0520 06/07/20 0500 06/08/20 0514  06/09/20 0054  NA 131* 131* 128* 127* 130*  K 4.9 4.3 4.5 4.5 3.7  CL 100 98 97* 95* 97*  CO2 23 22 23 23 24   GLUCOSE 97 103* 104* 108* 142*  BUN 8 6* 8 11 10   CREATININE 0.97 0.92 0.89 1.08 1.09  CALCIUM 7.9* 8.0* 7.9* 8.0* 7.7*  MG 1.8 2.0 1.8 1.9 1.9  AST 49* 88* 74* 79* 46*  ALT 66* 100* 103* 105* 78*  ALKPHOS 154* 207* 201* 203* 182*  BILITOT 1.1 1.0 1.0 1.0 0.6   ------------------------------------------------------------------------------------------------------------------ No results for input(s): CHOL, HDL, LDLCALC, TRIG, CHOLHDL, LDLDIRECT in the last 72 hours.  Lab Results  Component Value Date   HGBA1C 5.5 05/22/2020    ------------------------------------------------------------------------------------------------------------------ No results for input(s): TSH, T4TOTAL, T3FREE, THYROIDAB in the last 72 hours.  Invalid input(s): FREET3 ------------------------------------------------------------------------------------------------------------------ No results for input(s): VITAMINB12, FOLATE, FERRITIN, TIBC, IRON, RETICCTPCT in the last 72 hours.  Coagulation profile No results for input(s): INR, PROTIME in the last 168 hours.  No results for input(s): DDIMER in the last 72 hours.  Cardiac Enzymes No results for input(s): CKMB, TROPONINI, MYOGLOBIN in the last 168 hours.  Invalid input(s): CK ------------------------------------------------------------------------------------------------------------------    Component Value Date/Time   BNP 499.2 (H) 05/23/2020 1244    Micro Results Recent Results (from the past 240 hour(s))  C Difficile Quick Screen (NO PCR Reflex)     Status: Abnormal   Collection Time: 06/02/20  8:41 AM   Specimen: STOOL  Result Value Ref Range Status   C Diff antigen POSITIVE (A) NEGATIVE Final   C Diff toxin POSITIVE (A) NEGATIVE Final   C Diff interpretation Toxin producing C. difficile detected.  Final    Comment: CRITICAL RESULT CALLED TO, Barnes BACK BY AND VERIFIED WITH: Tenna Child RN 12:05 06/02/20 ([email or web address]) Performed at [facility], [address] [place], [place] [address]   Gastrointestinal Panel by PCR , Stool     Status: None   Collection Time: 06/02/20  9:41 AM   Specimen: STOOL  Result Value Ref Range Status   Campylobacter species NOT DETECTED NOT DETECTED Final   Plesimonas shigelloides NOT DETECTED NOT DETECTED Final   Salmonella species NOT DETECTED NOT DETECTED Final   Yersinia enterocolitica NOT DETECTED NOT DETECTED Final   Vibrio species NOT DETECTED NOT DETECTED Final   Vibrio cholerae NOT DETECTED NOT DETECTED Final   Enteroaggregative  E coli (EAEC) NOT DETECTED NOT DETECTED Final   Enteropathogenic E coli (EPEC) NOT DETECTED NOT DETECTED Final   Enterotoxigenic E coli (ETEC) NOT DETECTED NOT DETECTED Final   Shiga like toxin producing E coli (STEC) NOT DETECTED NOT DETECTED Final   Shigella/Enteroinvasive E coli (EIEC) NOT DETECTED NOT DETECTED Final   Cryptosporidium NOT DETECTED NOT DETECTED Final   Cyclospora cayetanensis NOT DETECTED NOT DETECTED Final   Entamoeba histolytica NOT DETECTED NOT DETECTED Final   Giardia lamblia NOT DETECTED NOT DETECTED Final   Adenovirus F40/41 NOT DETECTED NOT DETECTED Final   Astrovirus NOT DETECTED NOT DETECTED Final   Norovirus GI/GII NOT DETECTED NOT DETECTED Final   Rotavirus A NOT DETECTED NOT DETECTED Final   Sapovirus (I, II, IV, and V) NOT DETECTED NOT DETECTED Final    Comment: Performed at [facility] [address], [place], [place] [address]    Radiology Reports CT ABDOMEN PELVIS WO CONTRAST  Result Date: 06/01/2020 CLINICAL DATA:  Left upper quadrant diarrhea, history of COVID-19 pneumonia. EXAM: CT ABDOMEN AND PELVIS WITHOUT  CONTRAST TECHNIQUE: Multidetector CT imaging of the abdomen and pelvis was performed following the standard protocol without IV contrast. COMPARISON:  05/21/2020 FINDINGS: Lower chest: Bibasilar consolidation is noted increased when compared with the prior exam. These changes are worse on the left than the right. Stable changes of prior COVID-19 pneumonia are noted. No sizable effusion is seen. Hepatobiliary: No focal liver abnormality is seen. No gallstones, gallbladder wall thickening, or biliary dilatation. Pancreas: Unremarkable. No pancreatic ductal dilatation or surrounding inflammatory changes. Spleen: Normal in size without focal abnormality. Adrenals/Urinary Tract: Adrenal glands are within normal limits. Kidneys demonstrate left renal pelvis stone stable in appearance. No obstructive changes are seen. The bladder is well distended.  Stomach/Bowel: Diffuse edematous changes of the colonic wall are seen throughout its course increased when compared with the prior exam consistent with a generalized colitis. Some pericolonic inflammatory changes noted particularly on the right increased from the prior exam. The appendix appears within normal limits. Small bowel and stomach are unremarkable. Vascular/Lymphatic: Atherosclerotic calcifications of the aorta are noted. IVC filter is noted in place. No sizable lymphadenopathy is noted. Reproductive: Prostate is unremarkable. Other: No abdominal wall hernia or abnormality. No abdominopelvic ascites. Changes of prior hernia repair are noted in the lower abdomen bilaterally Musculoskeletal: Degenerative changes of lumbar spine are noted. IMPRESSION: Increasing colonic wall thickness with pericolonic inflammatory change consistent with progressive colitis. Increasing bilateral lower lobe consolidation without sizable effusion. Some scattered changes consistent with the COVID-19 pneumonia are noted. Stable nonobstructing left renal stone. Electronically Signed   By: Alcide Clever M.D.   On: 06/01/2020 23:59   CT HEAD WO CONTRAST  Result Date: 05/26/2020 CLINICAL DATA:  69 year old male with altered mental status. EXAM: CT HEAD WITHOUT CONTRAST TECHNIQUE: Contiguous axial images were obtained from the base of the skull through the vertex without intravenous contrast. COMPARISON:  Head CT dated 04/18/2010. FINDINGS: Brain: The ventricles and sulci appropriate size for patient's age. The gray-white matter discrimination is preserved. There is no acute intracranial hemorrhage. No mass effect midline shift. No extra-axial fluid collection. Vascular: No hyperdense vessel or unexpected calcification. Skull: Normal. Negative for fracture or focal lesion. Sinuses/Orbits: No acute finding. Other: None IMPRESSION: Unremarkable noncontrast CT of the brain. Electronically Signed   By: Elgie Collard M.D.   On:  05/26/2020 15:45   CT ANGIO CHEST PE W OR WO CONTRAST  Addendum Date: 05/21/2020   ADDENDUM REPORT: 05/21/2020 19:28 ADDENDUM: Critical Value/emergent results were called by telephone at the time of interpretation on 05/21/2020 at 7:27 pm to provider Dr. Dierdre Highman, who verbally acknowledged these results. Of note the airspace disease in the LEFT lower lobe is progressed from CT same day. Differential remains the same. Electronically Signed   By: Genevive Bi M.D.   On: 05/21/2020 19:28   Result Date: 05/21/2020 CLINICAL DATA:  PE suspected, high probability.  COVID-19 infection EXAM: CT ANGIOGRAPHY CHEST WITH CONTRAST TECHNIQUE: Multidetector CT imaging of the chest was performed using the standard protocol during bolus administration of intravenous contrast. Multiplanar CT image reconstructions and MIPs were obtained to evaluate the vascular anatomy. CONTRAST:  75mL OMNIPAQUE IOHEXOL 300 MG/ML  SOLN COMPARISON:  None. FINDINGS: Cardiovascular: Large filling defect within the proximal LEFT lower lobe pulmonary artery. Filling defect spans the bifurcation of the LEFT lower lobe and lingular branch pulmonary branches. This this filling defect/thrombus is occlusive to the LEFT lower lobe. No LEFT upper lobe filling defects identified. No filling defect identified within the RIGHT lung pulmonary arteries. No evidence  of RIGHT ventricular strain with the RV to LV ratio less than 1. Coronary artery calcification and aortic atherosclerotic calcification. Mediastinum/Nodes: No axillary or supraclavicular adenopathy. No mediastinal or hilar adenopathy. No pericardial fluid. Esophagus normal. Lungs/Pleura: There is diffuse very fine airspace disease within the LEFT lower lobe involving the majority of the LEFT lower lobe. Similar milder findings in the posterior RIGHT lower lobe and lingula. Upper Abdomen: Limited view of the liver, kidneys, pancreas are unremarkable. Normal adrenal glands. Musculoskeletal: No  aggressive osseous lesion. Review of the MIP images confirms the above findings. IMPRESSION: 1. Large occlusive pulmonary embolism within the proximal LEFT lower lobe pulmonary artery and lingular pulmonary artery. 2. Diffuse fine airspace disease within the LEFT lower lobe with differential including pulmonary infarction, pulmonary hemorrhage, or pneumonia (including COVID pneumonia). 3. No evidence of RIGHT ventricular strain. 4. Coronary artery calcification and aortic atherosclerotic calcification. Critical Value/emergent results were called by telephone at the time of interpretation on 05/21/2020 at 6:55 pm to provider [facility] , who verbally acknowledged these results. Electronically Signed: By: Genevive Bi M.D. On: 05/21/2020 18:59   IR IVC FILTER PLMT / S&I Lenise Arena GUID/MOD SED  Result Date: 05/24/2020 INDICATION: 69 year old with recent cardiac arrest likely secondary to pulmonary embolism and COVID-19. Subsequent right ventricular failure. Patient has clot in the left pulmonary arteries and clot in left femoral vein. There is concern that the patient would not tolerate additional clot burden in the pulmonary arteries and request for IVC filter placement. EXAM: IVC FILTER PLACEMENT; IVC VENOGRAM; ULTRASOUND FOR VASCULAR ACCESS Physician: Rachelle Hora. Lowella Dandy, MD MEDICATIONS: None. ANESTHESIA/SEDATION: Versed 1 mg The patient was continuously monitored during the procedure by the interventional radiology nurse under my direct supervision. CONTRAST:  Carbon dioxide FLUOROSCOPY TIME:  Fluoroscopy Time: 3 minutes, 18 seconds, 18 mGy COMPLICATIONS: None immediate. PROCEDURE: Informed consent was obtained for an IVC filter placement from the patient's daughter. Ultrasound demonstrated a patent right internal jugular vein. Ultrasound images were obtained for documentation. The right neck was prepped and draped in a sterile fashion. Maximal barrier sterile technique was utilized including caps, mask, sterile  gowns, sterile gloves, sterile drape, hand hygiene and skin antiseptic. The skin was anesthetized with 1% lidocaine. A 21 gauge needle was directed into the vein with ultrasound guidance and a micropuncture dilator set was placed. A wire was advanced into the IVC. The filter sheath was advanced over the wire into the IVC. An IVC venogram was performed with carbon dioxide. Bilateral renal veins were cannulated using a 5 French catheter and Bentson wire. Bilateral renal veins are identified. Fluoroscopic images were obtained for documentation. A Bard Denali filter was deployed below the lowest renal vein. Post placement images of the filter were obtained. The vascular sheath was removed with manual compression. Bandage placed over the puncture site. FINDINGS: IVC was patent. Bilateral renal veins were identified. The filter was deployed below the lowest renal vein. IMPRESSION: Successful placement of a retrievable IVC filter. PLAN: This IVC filter is potentially retrievable. The patient will be assessed for filter retrieval by Interventional Radiology in approximately 8-12 weeks. Further recommendations regarding filter retrieval, continued surveillance or declaration of device permanence, will be made at that time. Electronically Signed   By: Richarda Overlie M.D.   On: 05/24/2020 17:57   DG CHEST PORT 1 VIEW  Result Date: 05/26/2020 CLINICAL DATA:  Acute respiratory failure.  Hypoxia. EXAM: PORTABLE CHEST 1 VIEW COMPARISON:  05/22/2020. FINDINGS: Interim extubation and removal of NG tube. Left IJ line  in stable position. Heart size normal. Diffuse left lung infiltrate. Right base infiltrate. No prominent pleural effusion. No pneumothorax. IMPRESSION: 1. Interim extubation and removal of NG tube. Left IJ line stable position. 2. Diffuse left lung infiltrate and right base infiltrate. Electronically Signed   By: Maisie Fus  Register   On: 05/26/2020 05:50   DG CHEST PORT 1 VIEW  Result Date: 05/22/2020 CLINICAL DATA:   Central line placement, intubation EXAM: PORTABLE CHEST 1 VIEW COMPARISON:  05/21/2020 FINDINGS: Left central line has been placed with the tip in the SVC. No pneumothorax. Endotracheal tube is 5 cm above the carina. Patchy airspace disease throughout the left lung. No focal opacity on the right. Heart is normal size. IMPRESSION: Endotracheal tube 5 cm above the carina. Left central line tip in the SVC. No pneumothorax. Stable patchy left lung airspace disease. Electronically Signed   By: Charlett Nose M.D.   On: 05/22/2020 21:06   DG Chest Port 1 View  Result Date: 05/21/2020 CLINICAL DATA:  Cough, lower back pain, has not eaten 5 days, emesis EXAM: PORTABLE CHEST 1 VIEW COMPARISON:  Radiograph 04/19/2010, CT 04/18/2010 FINDINGS: Heterogeneous opacities present predominantly along the periphery of the left lung and minimally in the right lung base as well. No pneumothorax or effusion. The aorta is calcified. The remaining cardiomediastinal contours are unremarkable. No acute osseous or soft tissue abnormality. Degenerative changes are present in the imaged spine and shoulders. Telemetry leads overlie the chest. IMPRESSION: Heterogeneous opacities throughout the lungs, greatest in the left lung periphery concerning for pneumonia including potential viral etiology. Aortic Atherosclerosis (ICD10-I70.0). Electronically Signed   By: Kreg Shropshire M.D.   On: 05/21/2020 03:50   DG Abd Portable 1V  Result Date: 05/23/2020 CLINICAL DATA:  Encounter for orogastric tube placement. EXAM: PORTABLE ABDOMEN - 1 VIEW COMPARISON:  06/09/2009 and chest radiograph 05/22/2020 FINDINGS: Gastric tube extends into the abdomen and the tip is in the midline of the lower abdomen, likely in the distal stomach body region. Again noted are patchy densities at the left lung base. Few gas-filled loops of bowel in the abdomen. IMPRESSION: Orogastric tube in the lower abdominal midline and likely in the distal stomach body region.  Electronically Signed   By: Richarda Overlie M.D.   On: 05/23/2020 12:17   ECHOCARDIOGRAM COMPLETE  Result Date: 05/23/2020    ECHOCARDIOGRAM REPORT   Patient Name:   MYKAL BATIZ Date of Exam: 05/23/2020 Medical Rec #:  [id number]      Height:       72.0 in Accession #:    [id number]     Weight:       142.4 lb Date of Birth:  December 31, 1950      BSA:          1.844 m Patient Age:    69 years       BP:           112/85 mmHg Patient Gender: M              HR:           96 bpm. Exam Location:  Inpatient Procedure: 2D Echo, Cardiac Doppler and Color Doppler STAT ECHO Indications:    I26.02 Pulmonary embolus  History:        Patient has no prior history of Echocardiogram examinations.                 Arrythmias:Cardiac Arrest; Signs/Symptoms:Dyspnea, Hypotension  and Altered Mental Status. Covid 19 positive.  Sonographer:    Sheralyn Boatman RDCS Referring Phys: [id number] Martina Sinner  Sonographer Comments: Technically difficult study due to poor echo windows and echo performed with patient supine and on artificial respirator. IMPRESSIONS  1. Severe RV failure and cor pulmonale.  2. Abnormal septal motion and septal flattening consistent with RV pressure overload. Left ventricular ejection fraction, by estimation, is 50 to 55%. The left ventricle has low normal function. The left ventricle has no regional wall motion abnormalities. Left ventricular diastolic parameters were normal.  3. Right ventricular systolic function is severely reduced. The right ventricular size is severely enlarged. There is mildly elevated pulmonary artery systolic pressure.  4. The mitral valve is normal in structure. No evidence of mitral valve regurgitation. No evidence of mitral stenosis.  5. The aortic valve is normal in structure. Aortic valve regurgitation is not visualized. No aortic stenosis is present.  6. The inferior vena cava is dilated in size with <50% respiratory variability, suggesting right atrial pressure of 15 mmHg.  FINDINGS  Left Ventricle: Abnormal septal motion and septal flattening consistent with RV pressure overload. Left ventricular ejection fraction, by estimation, is 50 to 55%. The left ventricle has low normal function. The left ventricle has no regional wall motion abnormalities. The left ventricular internal cavity size was normal in size. There is no left ventricular hypertrophy. Left ventricular diastolic parameters were normal. Right Ventricle: The right ventricular size is severely enlarged. Right vetricular wall thickness was not assessed. Right ventricular systolic function is severely reduced. There is mildly elevated pulmonary artery systolic pressure. The tricuspid regurgitant velocity is 2.67 m/s, and with an assumed right atrial pressure of 8 mmHg, the estimated right ventricular systolic pressure is 36.5 mmHg. Left Atrium: Left atrial size was normal in size. Right Atrium: Right atrial size was normal in size. Pericardium: There is no evidence of pericardial effusion. Mitral Valve: The mitral valve is normal in structure. No evidence of mitral valve regurgitation. No evidence of mitral valve stenosis. Tricuspid Valve: The tricuspid valve is normal in structure. Tricuspid valve regurgitation is mild . No evidence of tricuspid stenosis. Aortic Valve: The aortic valve is normal in structure. Aortic valve regurgitation is not visualized. No aortic stenosis is present. Pulmonic Valve: The pulmonic valve was normal in structure. Pulmonic valve regurgitation is not visualized. No evidence of pulmonic stenosis. Aorta: The aortic root is normal in size and structure. Venous: The inferior vena cava is dilated in size with less than 50% respiratory variability, suggesting right atrial pressure of 15 mmHg. IAS/Shunts: No atrial level shunt detected by color flow Doppler. Additional Comments: Severe RV failure and cor pulmonale.  LEFT VENTRICLE PLAX 2D LVIDd:         3.70 cm     Diastology LVIDs:         2.80 cm      LV e' medial:    4.46 cm/s LV PW:         1.10 cm     LV E/e' medial:  4.9 LV IVS:        1.10 cm     LV e' lateral:   6.97 cm/s LVOT diam:     2.10 cm     LV E/e' lateral: 3.1 LV SV:         28 LV SV Index:   15 LVOT Area:     3.46 cm  LV Volumes (MOD) LV vol d, MOD A2C: 22.6 ml LV  vol d, MOD A4C: 25.5 ml LV vol s, MOD A2C: 11.1 ml LV vol s, MOD A4C: 11.7 ml LV SV MOD A2C:     11.5 ml LV SV MOD A4C:     25.5 ml LV SV MOD BP:      13.7 ml RIGHT VENTRICLE            IVC RV S prime:     8.84 cm/s  IVC diam: 2.50 cm TAPSE (M-mode): 1.3 cm LEFT ATRIUM           Index      RIGHT ATRIUM           Index LA diam:      2.80 cm 1.52 cm/m RA Area:     14.30 cm LA Vol (A4C): 10.5 ml 5.69 ml/m RA Volume:   42.70 ml  23.15 ml/m  AORTIC VALVE LVOT Vmax:   53.80 cm/s LVOT Vmean:  41.900 cm/s LVOT VTI:    0.080 m  AORTA Ao Root diam: 3.60 cm Ao Asc diam:  3.50 cm MITRAL VALVE               TRICUSPID VALVE MV Area (PHT): 3.27 cm    TR Peak grad:   28.5 mmHg MV Decel Time: 232 msec    TR Vmax:        267.00 cm/s MV E velocity: 21.94 cm/s MV A velocity: 41.30 cm/s  SHUNTS MV E/A ratio:  0.53        Systemic VTI:  0.08 m                            Systemic Diam: 2.10 cm Charlton Haws MD Electronically signed by Charlton Haws MD Signature Date/Time: 05/23/2020/10:46:38 AM    Final    CT RENAL STONE STUDY  Result Date: 05/21/2020 CLINICAL DATA:  68 year old male with low back pain, flank pain, decreased p.o. EXAM: CT ABDOMEN AND PELVIS WITHOUT CONTRAST TECHNIQUE: Multidetector CT imaging of the abdomen and pelvis was performed following the standard protocol without IV contrast. COMPARISON:  Noncontrast CT Abdomen and Pelvis 10/28/2008. Portable chest radiograph 04/19/2010. FINDINGS: Lower chest: Patchy, irregular peribronchial and peripheral scattered pulmonary opacity at both lung bases. This seems to be new from last month. Underlying probable pulmonary hyperinflation. Some associated lung base bronchiectasis. No cavitating  areas identified. No cardiomegaly, pericardial effusion or pleural effusion. Hepatobiliary: Negative noncontrast liver and gallbladder. Pancreas: Negative. Spleen: Negative. Adrenals/Urinary Tract: Bulky left nephrolithiasis measuring 13 mm at the renal pelvis. Additional left lower pole stone. No hydronephrosis. Negative noncontrast right kidney. No hydroureter. Unremarkable urinary bladder. Incidental pelvic phleboliths. Stomach/Bowel: Decompressed large bowel. There is long segment circumferential wall thickening in the right colon, up to the 12 mm series 4, image 49) with some areas of adjacent mesenteric stranding (coronal image 43). The wall thickening gradually decreases through the transverse colon. No free air. No free fluid. The terminal ileum appear spared. Appendix seems to remain normal on coronal image 32. No dilated small bowel.  Decompressed stomach and duodenum. Vascular/Lymphatic: Normal caliber abdominal aorta. Mild calcified atherosclerosis. Vascular patency is not evaluated in the absence of IV contrast. No lymphadenopathy identified in the absence of contrast. Reproductive: Negative. Other: Previous lower abdominal ventral hernia repair with mesh. No pelvic free fluid. Musculoskeletal: Degenerative changes in the spine. No acute osseous abnormality identified. IMPRESSION: 1. In the abdomen there is circumferential bowel wall thickening and mesenteric stranding involving the right  colon. Wall thickening gradually abates through the transverse colon. This is compatible with Acute Nonspecific Colitis. 2. But the lung bases are also abnormal with patchy and irregular peribronchial and peripheral pulmonary opacity suspicious for acute viral/atypical respiratory infection. No areas of cavitation to suggest septic emboli. No pleural effusion. 3. Bulky left nephrolithiasis, but no obstructive uropathy. 4. Previous lower abdominal hernia repair with mesh. Electronically Signed   By: Odessa Fleming M.D.   On:  05/21/2020 02:46   VAS [place] LOWER EXTREMITY VENOUS (DVT)  Result Date: 05/24/2020  Lower Venous DVT Study Indications: Covid-19, pulmonary embolism.  Risk Factors: Confirmed PE. Anticoagulation: Heparin. Limitations: Body habitus, ventilation, did not perform compressions in thigh and popliteal fossa of the left lower extremity secondary to mobile thrombus, bandages and line. Comparison Study: No prior study Performing Technologist: Sherren Kerns RVS  Examination Guidelines: A complete evaluation includes B-mode imaging, spectral Doppler, color Doppler, and power Doppler as needed of all accessible portions of each vessel. Bilateral testing is considered an integral part of a complete examination. Limited examinations for reoccurring indications may be performed as noted. The reflux portion of the exam is performed with the patient in reverse Trendelenburg.  +---------+---------------+---------+-----------+----------+--------------+ RIGHT    CompressibilityPhasicitySpontaneityPropertiesThrombus Aging +---------+---------------+---------+-----------+----------+--------------+ CFV      Full           Yes      No                                  +---------+---------------+---------+-----------+----------+--------------+ SFJ      Full                                                        +---------+---------------+---------+-----------+----------+--------------+ FV Prox  Full                                                        +---------+---------------+---------+-----------+----------+--------------+ FV Mid   Full                                                        +---------+---------------+---------+-----------+----------+--------------+ FV DistalFull                                                        +---------+---------------+---------+-----------+----------+--------------+ PFV      Full                                                         +---------+---------------+---------+-----------+----------+--------------+ POP      Full           Yes  No                                  +---------+---------------+---------+-----------+----------+--------------+ PTV      Full                                                        +---------+---------------+---------+-----------+----------+--------------+ PERO     Full                                                        +---------+---------------+---------+-----------+----------+--------------+   +---------+---------------+---------+-----------+----------+-------------------+ LEFT     CompressibilityPhasicitySpontaneityPropertiesThrombus Aging      +---------+---------------+---------+-----------+----------+-------------------+ CFV                                                   not visualized                                                            secondary to                                                              bandage/line        +---------+---------------+---------+-----------+----------+-------------------+ SFJ                                                   not visualized                                                            secondary to                                                              bandage/line        +---------+---------------+---------+-----------+----------+-------------------+ FV Prox  Full                                         patent by color     +---------+---------------+---------+-----------+----------+-------------------+ FV Mid  patent by color     +---------+---------------+---------+-----------+----------+-------------------+ FV Distal                                             patent by color     +---------+---------------+---------+-----------+----------+-------------------+ PFV                                                    patent by color     +---------+---------------+---------+-----------+----------+-------------------+ POP                                                   patent by color     +---------+---------------+---------+-----------+----------+-------------------+ PTV      Full                                                             +---------+---------------+---------+-----------+----------+-------------------+ PERO     Full                                                             +---------+---------------+---------+-----------+----------+-------------------+     Summary: RIGHT: - There is no evidence of deep vein thrombosis in the lower extremity.  vessels are dilated with significantly sluggish flow, throughout  LEFT: - Findings consistent with acute deep vein thrombosis involving the left femoral vein. - Acute, mobile thrombus noted in the proximal and mid femoral vein. Vessels are dilated with significantly sluggish flow, throughout.  *See table(s) above for measurements and observations. Electronically signed by Waverly Ferrari MD on 05/24/2020 at 6:21:22 PM.    Final

## 2020-06-09 NOTE — Progress Notes (Signed)
Patient had one (medium-sized) bowel movement throughout the shift 1900-0700 (type 6) brown in color.

## 2020-06-10 ENCOUNTER — Inpatient Hospital Stay (HOSPITAL_COMMUNITY): Payer: Medicare HMO

## 2020-06-10 DIAGNOSIS — U071 COVID-19: Secondary | ICD-10-CM | POA: Diagnosis not present

## 2020-06-10 LAB — GLUCOSE, CAPILLARY
Glucose-Capillary: 105 mg/dL — ABNORMAL HIGH (ref 70–99)
Glucose-Capillary: 105 mg/dL — ABNORMAL HIGH (ref 70–99)
Glucose-Capillary: 109 mg/dL — ABNORMAL HIGH (ref 70–99)
Glucose-Capillary: 117 mg/dL — ABNORMAL HIGH (ref 70–99)
Glucose-Capillary: 127 mg/dL — ABNORMAL HIGH (ref 70–99)
Glucose-Capillary: 89 mg/dL (ref 70–99)
Glucose-Capillary: 99 mg/dL (ref 70–99)

## 2020-06-10 LAB — URINALYSIS, ROUTINE W REFLEX MICROSCOPIC
Bilirubin Urine: NEGATIVE
Glucose, UA: NEGATIVE mg/dL
Hgb urine dipstick: NEGATIVE
Ketones, ur: NEGATIVE mg/dL
Nitrite: POSITIVE — AB
Protein, ur: NEGATIVE mg/dL
Specific Gravity, Urine: 1.01 (ref 1.005–1.030)
WBC, UA: >50 WBC/hpf — ABNORMAL HIGH (ref 0–5)
pH: 6 (ref 5.0–8.0)

## 2020-06-10 LAB — MAGNESIUM: Magnesium: 1.9 mg/dL (ref 1.7–2.4)

## 2020-06-10 LAB — BASIC METABOLIC PANEL
Anion gap: 9 (ref 5–15)
BUN: 14 mg/dL (ref 8–23)
CO2: 24 mmol/L (ref 22–32)
Calcium: 7.9 mg/dL — ABNORMAL LOW (ref 8.9–10.3)
Chloride: 95 mmol/L — ABNORMAL LOW (ref 98–111)
Creatinine, Ser: 1.05 mg/dL (ref 0.61–1.24)
GFR, Estimated: >60 mL/min (ref >60)
Glucose, Bld: 112 mg/dL — ABNORMAL HIGH (ref 70–99)
Potassium: 4.3 mmol/L (ref 3.5–5.1)
Sodium: 128 mmol/L — ABNORMAL LOW (ref 135–145)

## 2020-06-10 LAB — MRSA PCR SCREENING: MRSA by PCR: POSITIVE — AB

## 2020-06-10 LAB — CBC
HCT: 25.6 % — ABNORMAL LOW (ref 39.0–52.0)
Hemoglobin: 8.5 g/dL — ABNORMAL LOW (ref 13.0–17.0)
MCH: 28.6 pg (ref 26.0–34.0)
MCHC: 33.2 g/dL (ref 30.0–36.0)
MCV: 86.2 fL (ref 80.0–100.0)
Platelets: 582 10*3/uL — ABNORMAL HIGH (ref 150–400)
RBC: 2.97 MIL/uL — ABNORMAL LOW (ref 4.22–5.81)
RDW: 14.6 % (ref 11.5–15.5)
WBC: 10.5 10*3/uL (ref 4.0–10.5)
nRBC: 0 % (ref 0.0–0.2)

## 2020-06-10 LAB — C-REACTIVE PROTEIN: CRP: 22.6 mg/dL — ABNORMAL HIGH (ref <1.0)

## 2020-06-10 LAB — PROCALCITONIN: Procalcitonin: 0.15 ng/mL

## 2020-06-10 MED ORDER — LACTATED RINGERS IV SOLN: INTRAVENOUS | Status: AC

## 2020-06-10 MED ORDER — VANCOMYCIN HCL 1250 MG/250ML IV SOLN
1250.0000 mg | Freq: Once | INTRAVENOUS | Status: AC
  Administered 2020-06-11: 1250 mg via INTRAVENOUS
  Filled 2020-06-10 (×2): qty 250

## 2020-06-10 MED ORDER — VANCOMYCIN 50 MG/ML ORAL SOLUTION
125.0000 mg | Freq: Four times a day (QID) | ORAL | Status: DC
Start: 2020-06-10 — End: 2020-06-14

## 2020-06-10 MED ORDER — CEPHALEXIN 500 MG PO CAPS
500.0000 mg | ORAL_CAPSULE | Freq: Once | ORAL | Status: AC
  Administered 2020-06-10: 500 mg via ORAL
  Filled 2020-06-10: qty 1

## 2020-06-10 MED ORDER — LACTATED RINGERS IV BOLUS
500.0000 mL | Freq: Once | INTRAVENOUS | Status: AC
  Administered 2020-06-10: 500 mL via INTRAVENOUS

## 2020-06-10 MED ORDER — APIXABAN 5 MG PO TABS
5.0000 mg | ORAL_TABLET | Freq: Two times a day (BID) | ORAL | Status: DC
Start: 2020-06-10 — End: 2020-06-25

## 2020-06-10 NOTE — TOC Transition Note (Signed)
Transition of Care [facility]) - CM/SW Discharge Note   Patient Details  Name: Dwayne Barnes MRN: [id number] Date of Birth: 11-Jan-1951  [facility]) CM/SW Contact:  Mearl Latin, LCSW Phone Number: 06/10/2020, 12:50 PM   Clinical Narrative:    Patient will DC to: [facility] Anticipated DC date: 06/10/20 Family notified: Daughter, Naval architect by: Sharin Mons   Per MD patient ready for DC to [facility]. RN to call report prior to discharge [phone] room 106). RN, patient, patient's family, and facility notified of DC. Discharge Summary and FL2 sent to facility. DC packet on chart. Ambulance transport requested for patient.   CSW will sign off for now as social work intervention is no longer needed. Please consult [place] again if new needs arise.      Final next level of care: Skilled Nursing Facility Barriers to Discharge: Barriers Resolved   Patient Goals and CMS Choice Patient states their goals for this hospitalization and ongoing recovery are:: Rehab CMS Medicare.gov Compare Post Acute Care list provided to:: Patient Represenative (must comment) Choice offered to / list presented to : Patient, Adult Children  Discharge Placement   Existing PASRR number confirmed : 06/10/20          Patient chooses bed at: [facility] Patient to be transferred to facility by: [facility] Name of family member notified: Herne, daughter Patient and family notified of of transfer: 06/10/20  Discharge Plan and Services In-house Referral: Clinical Social Work Discharge Planning Services: CM Consult Post Acute Care Choice: Skilled Nursing Facility                               Social Determinants of Health (SDOH) Interventions     Readmission Risk Interventions No flowsheet data found.

## 2020-06-10 NOTE — TOC Progression Note (Signed)
Transition of Care [facility]) - Progression Note    Patient Details  Name: Dwayne Barnes MRN: [id number] Date of Birth: May 27, 1951  [facility] Care [facility]) CM/SW Contact  Mearl Latin, LCSW Phone Number: 06/10/2020, 11:27 AM  Clinical Narrative:    CSW was informed by [facility] that they have a private room available for patient. They are checking to make sure insurance approval is still effective for today. CSW updated patient's daughter, [name].    Expected Discharge Plan: Skilled Nursing Facility Barriers to Discharge: Continued Medical Work up, English as a second language teacher, SNF Pending bed offer  Expected Discharge Plan and Services Expected Discharge Plan: Skilled Nursing Facility In-house Referral: Clinical Social Work Discharge Planning Services: CM Consult Post Acute Care Choice: Skilled Nursing Facility Living arrangements for the past 2 months: Single Family Home Expected Discharge Date: 06/10/20                                     Social Determinants of Health (SDOH) Interventions    Readmission Risk Interventions No flowsheet data found.

## 2020-06-10 NOTE — Plan of Care (Signed)
Patient is currently resting in bed. No complaints overnight. Condom cath in place. VSS. Remains on room air. Tolerating diet. Call bell within reach. Bed alarm on.   Problem: Education: Goal: Knowledge of risk factors and measures for prevention of condition will improve Outcome: Progressing   Problem: Coping: Goal: Psychosocial and spiritual needs will be supported Outcome: Progressing   Problem: Respiratory: Goal: Will maintain a patent airway Outcome: Progressing Goal: Complications related to the disease process, condition or treatment will be avoided or minimized Outcome: Progressing

## 2020-06-10 NOTE — Progress Notes (Signed)
Report given to Designer, industrial/product at [facility]

## 2020-06-10 NOTE — Progress Notes (Signed)
CSW notified by RN that discharge has been cancelled due to patient having a fever. CSW cancelled [facility] and updated SNF. RN to make daughter aware. SNF will have to get a new insurance approval as it expires today; will need updated PT note.   Nira Visscher LCSW

## 2020-06-10 NOTE — Progress Notes (Signed)
Attempted to call report to [facility]

## 2020-06-10 NOTE — Progress Notes (Signed)
2nd attempt to give report to [facility]

## 2020-06-10 NOTE — Discharge Summary (Addendum)
Dwayne Barnes AVW:[id number] DOB: 20-Nov-1950 DOA: 05/20/2020  PCP: [name], PA-C  Admit date: 05/20/2020  Discharge date: 06/16/2020  Admitted From: Home  Disposition:  SNF   Recommendations for Outpatient Follow-up:   Follow up with PCP in 1-2 weeks  PCP Please obtain BMP/CBC, 2 view CXR in 1week,  (see Discharge instructions)   PCP Please follow up on the following pending results:    Home Health: None   Equipment/Devices: None  Consultations: None  Discharge Condition: Stable    CODE STATUS: Full    Diet Recommendation:   Diet Order            DIET DYS 3 Room service appropriate? No; Fluid consistency: Thin; Fluid restriction: 1200 mL Fluid  Diet effective now                  Chief Complaint  Patient presents with  . Fatigue     Brief history of present illness from the day of admission and additional interim summary    Patient is a 69 y.o. male with no significant past medical history-presented to the ED on 11/17 with nausea/vomiting, weakness and back pain-further evaluation revealed COVID-19 infection and a large PE.  Patient was started on IV heparin infusion along with steroid/Remdesivir-on 11/19-patient had a cardiac arrest-was intubated during the code-total downtime of around 6 minutes before ROSC.  Patient was subsequently transferred to the ICU-unfortunately soon after arrival in the ICU-he had another cardiac arrest-and 4 rounds of CPR were provided with ROSC.  Patient was provided supportive care-he subsequently improved-self extubated on 11/23-he was transferred to the [facility] hospitalist service on 11/25.  Further hospital course complicated by diarrhea secondary to C. difficile along with AKI and hyponatremia.See below for further details.   COVID-19 vaccinated  status: Vaccinated  Significant Events: 11/17>> Admit to [facility] for PE/COVID-19 infection 11/19>> PEA cardiac arrest x 2-given TPA-intubated-transfer to ICU 11/23>> Self extubated 11/25>> transfer to [facility] 11/29>> worsening diarrhea-CT abdomen with progressive colitis  Significant studies: 11/18>>CT renal stone: non specific colitis, GGO's, left nephrolithiasis without obstructive uropathy. 11/18>>CTA chest: large PC in LLL, GGO's LLL. 11/20 echo>> EF 50-55%, RV systolic function severely reduced. 11/21>> B/L extremity Doppler: acute DVTin left femoral vein, acute mobile thrombus in the proximal femoral vein. 11/23>>CT Head: unremarkable 11/29>> CT abdomen/pelvis>> progressive colitis  COVID-19 medications: Steroids: 11/17>>11/26 Remdesivir: 11/17>> 11/22  Antibiotics: Oral vancomycin: 11/30>>  Microbiology data: 11/18>> urine culture: Insignificant growth 11/30>> stool C. difficile: +ve 11/30>> GI pathogen panel: negative  Procedures: ETT>>11/19 - 11/23 IVC filter placement by IR>>11/21.  Consults: PCCM, IR  DVT prophylaxis: Therapeutic Lovenox>> [facility] Course   Cardiac  arrest due to large PE: S/p TPA-was on therapeutic Lovenox-we will transition to Eliquis today. PE likely provoked by COVID-19 infection.  PCCM MD discussed with Dr. Illa Level indication for any acute intervention.  Continue telemetry monitoring.  Echo as above.  Large PE with DVT: Continue therapeutic anticoagulation-he is s/p TPA on 11/19 when he had cardiac arrest.  Patient is s/p IVC filter given mobile thrombus seen on lower extremity Doppler.  Once oral intake was stabilized-he was transitioned to Eliquis.  Acute hypoxic respiratory failure: Due to PE/cardiac arrest/COVID-19 pneumonia-self extubated on 11/23-currently stable on room air.  Acute metabolic encephalopathy/ICU delirium/possible anoxic injury: Mental  status continues to slowly improve-getting more fluent with responses.  CT head without acute changes.  Ental status is much improved.  Dysphagia: In the setting of cardiac arrest-seen by SLP-required NG tube-subsequently seen by SLP-diet gradually upgraded to a dysphagia 3 diet.  Follow speech therapy follow-up at SNF.  C. difficile colitis: Diarrhea improving-leukocytosis/AKI all getting better.  Continue oral vancomycin with stop date on 06/20/2020, stools are now semiformed.  Note patient had few episodes of low-grade fever on 06/11/2020 and 06/12/2020.  No clear source of bacterial infection was identified, he received 1 dose of IV vancomycin and oral Keflex.  However blood and urine cultures are stable, procalcitonin is stable.  I am not giving him antibiotics with no clear indication at this time as I worry that his C. difficile will get out of hand which is now somewhat uncontrolled.  Will request SNF staff to monitor closely.  If any sustained fever please send him back to the hospital.    AKI: Likely hemodynamically mediated-due to diarrhea-has resolved with IV fluids and improvement in diarrhea.  COVID-19 pneumonia: Completed a course of Remdesivir-an steroids.  Hypoxia has resolved.  He is symptom-free on room air.   Paroxysmal atrial fibrillation: appears to be back in sinus rhythm-on anticoagulation for PE.  Echo as above.    Normocytic anemia: Secondary to critical illness-stable for monitoring-no indication for PRBC transfusion.  Transaminitis: Likely secondary to COVID-19-downtrending.  CMP along with CBC in 7 to 10 days at [facility].  Deconditioning/debility: Secondary to critical illness- PT/OT eval completed-recommendations are for SNF, will be discharged to SNF.  Hard of hearing - supportive care.  Hyponatremia.    Mild and somewhat chronic, monitor intermittently, baseline sodium seems to be around 130. Cut down on daily soda intake. Consumes massive amounts of  soda.   Discharge diagnosis     Principal Problem:   COVID-19 virus infection Active Problems:   Generalized weakness   Acute pulmonary embolism [facility])    Discharge instructions    Discharge Instructions    Discharge instructions   Complete by: As directed    Follow with Primary MD [name], PA-C in 7 days   Get CBC, CMP, 2 view Chest X ray -  checked next visit within 1 week by Primary MD or SNF MD    Activity: As tolerated with Full fall precautions use walker/cane & assistance as needed  Disposition SNF  Diet: Dysphagia 3 diet with feeding assistance and aspiration precautions.   Special Instructions: If you have smoked or chewed Tobacco  in the last 2 yrs please stop smoking, stop any regular Alcohol  and or any Recreational drug use.  On your next visit with your primary care physician please Get Medicines reviewed and adjusted.  Please request your Prim.MD to go over all Hospital Tests and Procedure/Radiological results at the follow up, please get  all Hospital records sent to your Prim MD by signing hospital release before you go home.  If you experience worsening of your admission symptoms, develop shortness of breath, life threatening emergency, suicidal or homicidal thoughts you must seek medical attention immediately by calling 911 or calling your MD immediately  if symptoms less severe.  You Must read complete instructions/literature along with all the possible adverse reactions/side effects for all the Medicines you take and that have been prescribed to you. Take any new Medicines after you have completely understood and accpet all the possible adverse reactions/side effects.   Discharge instructions   Complete by: As directed    Follow with Primary MD Ladora DanielBeal, Sheri, PA-C in 7 days   Get CBC, CMP, 2 view Chest X ray -  checked next visit within 1 week by Primary MD or SNF MD    Activity: As tolerated with Full fall precautions use walker/cane & assistance as  needed  Disposition SNF  Diet: Dysphagia 3 diet with feeding assistance and aspiration precautions.   Special Instructions: If you have smoked or chewed Tobacco  in the last 2 yrs please stop smoking, stop any regular Alcohol  and or any Recreational drug use.  On your next visit with your primary care physician please Get Medicines reviewed and adjusted.  Please request your Prim.MD to go over all Hospital Tests and Procedure/Radiological results at the follow up, please get all Hospital records sent to your Prim MD by signing hospital release before you go home.  If you experience worsening of your admission symptoms, develop shortness of breath, life threatening emergency, suicidal or homicidal thoughts you must seek medical attention immediately by calling 911 or calling your MD immediately  if symptoms less severe.  You Must read complete instructions/literature along with all the possible adverse reactions/side effects for all the Medicines you take and that have been prescribed to you. Take any new Medicines after you have completely understood and accpet all the possible adverse reactions/side effects.   Increase activity slowly   Complete by: As directed    Increase activity slowly   Complete by: As directed    No dressing needed   Complete by: As directed    No wound care   Complete by: As directed       Discharge Medications   Allergies as of 06/16/2020   No Known Allergies     Medication List    STOP taking these medications   ibuprofen 200 MG tablet Commonly known as: ADVIL     TAKE these medications   Alive Mens Energy Tabs Take 1 tablet by mouth daily.   apixaban 5 MG Tabs tablet Commonly known as: ELIQUIS Take 1 tablet (5 mg total) by mouth 2 (two) times daily.   famotidine 20 MG tablet Commonly known as: PEPCID Take 1 tablet (20 mg total) by mouth daily.   guaiFENesin 600 MG 12 hr tablet Commonly known as: MUCINEX Take 600 mg by mouth 2 (two)  times daily as needed for cough or to loosen phlegm.   hydrocortisone cream 1 % Apply 1 application topically as needed (for bite).   Neosporin Original [phone] [id number] Oint Apply 1 application topically as needed (for bite).   OMEGA 3 PO Take 1 capsule by mouth daily.   OVER THE COUNTER MEDICATION Take 1 tablet by mouth daily. "ANDROZONE" TESTOSTERONE SUPPLEMENT   vancomycin 50 mg/mL  oral solution Commonly known as: VANCOCIN Take 2.5 mLs (125 mg total) by mouth 4 (four) times  daily for 5 days.            Discharge Care Instructions  (From admission, onward)         Start     Ordered   06/14/20 0000  No dressing needed        06/14/20 1011           Contact information for follow-up providers    [name], PA-C. Call.   Specialty: Physician Assistant Why: 1-2 weeks Contact information: [address] [place] [address] [phone]            Contact information for after-discharge care    Destination    [facility] Preferred SNF .   Service: Skilled Nursing Contact information: [address] [place] [address] [phone]                  Major procedures and Radiology Reports - PLEASE review detailed and final reports thoroughly  -        CT ABDOMEN PELVIS WO CONTRAST  Result Date: 06/01/2020 CLINICAL DATA:  Left upper quadrant diarrhea, history of COVID-19 pneumonia. EXAM: CT ABDOMEN AND PELVIS WITHOUT CONTRAST TECHNIQUE: Multidetector CT imaging of the abdomen and pelvis was performed following the standard protocol without IV contrast. COMPARISON:  05/21/2020 FINDINGS: Lower chest: Bibasilar consolidation is noted increased when compared with the prior exam. These changes are worse on the left than the right. Stable changes of prior COVID-19 pneumonia are noted. No sizable effusion is seen. Hepatobiliary: No focal liver abnormality is seen. No gallstones, gallbladder wall thickening, or biliary dilatation.  Pancreas: Unremarkable. No pancreatic ductal dilatation or surrounding inflammatory changes. Spleen: Normal in size without focal abnormality. Adrenals/Urinary Tract: Adrenal glands are within normal limits. Kidneys demonstrate left renal pelvis stone stable in appearance. No obstructive changes are seen. The bladder is well distended. Stomach/Bowel: Diffuse edematous changes of the colonic wall are seen throughout its course increased when compared with the prior exam consistent with a generalized colitis. Some pericolonic inflammatory changes noted particularly on the right increased from the prior exam. The appendix appears within normal limits. Small bowel and stomach are unremarkable. Vascular/Lymphatic: Atherosclerotic calcifications of the aorta are noted. IVC filter is noted in place. No sizable lymphadenopathy is noted. Reproductive: Prostate is unremarkable. Other: No abdominal wall hernia or abnormality. No abdominopelvic ascites. Changes of prior hernia repair are noted in the lower abdomen bilaterally Musculoskeletal: Degenerative changes of lumbar spine are noted. IMPRESSION: Increasing colonic wall thickness with pericolonic inflammatory change consistent with progressive colitis. Increasing bilateral lower lobe consolidation without sizable effusion. Some scattered changes consistent with the COVID-19 pneumonia are noted. Stable nonobstructing left renal stone. Electronically Signed   By: Alcide Clever M.D.   On: 06/01/2020 23:59   CT HEAD WO CONTRAST  Result Date: 05/26/2020 CLINICAL DATA:  69 year old male with altered mental status. EXAM: CT HEAD WITHOUT CONTRAST TECHNIQUE: Contiguous axial images were obtained from the base of the skull through the vertex without intravenous contrast. COMPARISON:  Head CT dated 04/18/2010. FINDINGS: Brain: The ventricles and sulci appropriate size for patient's age. The gray-white matter discrimination is preserved. There is no acute intracranial hemorrhage.  No mass effect midline shift. No extra-axial fluid collection. Vascular: No hyperdense vessel or unexpected calcification. Skull: Normal. Negative for fracture or focal lesion. Sinuses/Orbits: No acute finding. Other: None IMPRESSION: Unremarkable noncontrast CT of the brain. Electronically Signed   By: Elgie Collard M.D.   On: 05/26/2020 15:45  CT ANGIO CHEST PE W OR WO CONTRAST  Addendum Date: 05/21/2020   ADDENDUM REPORT: 05/21/2020 19:28 ADDENDUM: Critical Value/emergent results were called by telephone at the time of interpretation on 05/21/2020 at 7:27 pm to provider Dr. Dierdre Highman, who verbally acknowledged these results. Of note the airspace disease in the LEFT lower lobe is progressed from CT same day. Differential remains the same. Electronically Signed   By: Genevive Bi M.D.   On: 05/21/2020 19:28   Result Date: 05/21/2020 CLINICAL DATA:  PE suspected, high probability.  COVID-19 infection EXAM: CT ANGIOGRAPHY CHEST WITH CONTRAST TECHNIQUE: Multidetector CT imaging of the chest was performed using the standard protocol during bolus administration of intravenous contrast. Multiplanar CT image reconstructions and MIPs were obtained to evaluate the vascular anatomy. CONTRAST:  75mL OMNIPAQUE IOHEXOL 300 MG/ML  SOLN COMPARISON:  None. FINDINGS: Cardiovascular: Large filling defect within the proximal LEFT lower lobe pulmonary artery. Filling defect spans the bifurcation of the LEFT lower lobe and lingular branch pulmonary branches. This this filling defect/thrombus is occlusive to the LEFT lower lobe. No LEFT upper lobe filling defects identified. No filling defect identified within the RIGHT lung pulmonary arteries. No evidence of RIGHT ventricular strain with the RV to LV ratio less than 1. Coronary artery calcification and aortic atherosclerotic calcification. Mediastinum/Nodes: No axillary or supraclavicular adenopathy. No mediastinal or hilar adenopathy. No pericardial fluid. Esophagus  normal. Lungs/Pleura: There is diffuse very fine airspace disease within the LEFT lower lobe involving the majority of the LEFT lower lobe. Similar milder findings in the posterior RIGHT lower lobe and lingula. Upper Abdomen: Limited view of the liver, kidneys, pancreas are unremarkable. Normal adrenal glands. Musculoskeletal: No aggressive osseous lesion. Review of the MIP images confirms the above findings. IMPRESSION: 1. Large occlusive pulmonary embolism within the proximal LEFT lower lobe pulmonary artery and lingular pulmonary artery. 2. Diffuse fine airspace disease within the LEFT lower lobe with differential including pulmonary infarction, pulmonary hemorrhage, or pneumonia (including COVID pneumonia). 3. No evidence of RIGHT ventricular strain. 4. Coronary artery calcification and aortic atherosclerotic calcification. Critical Value/emergent results were called by telephone at the time of interpretation on 05/21/2020 at 6:55 pm to provider [facility] , who verbally acknowledged these results. Electronically Signed: By: Genevive Bi M.D. On: 05/21/2020 18:59   IR IVC FILTER PLMT / S&I Lenise Arena GUID/MOD SED  Result Date: 05/24/2020 INDICATION: 69 year old with recent cardiac arrest likely secondary to pulmonary embolism and COVID-19. Subsequent right ventricular failure. Patient has clot in the left pulmonary arteries and clot in left femoral vein. There is concern that the patient would not tolerate additional clot burden in the pulmonary arteries and request for IVC filter placement. EXAM: IVC FILTER PLACEMENT; IVC VENOGRAM; ULTRASOUND FOR VASCULAR ACCESS Physician: Rachelle Hora. Lowella Dandy, MD MEDICATIONS: None. ANESTHESIA/SEDATION: Versed 1 mg The patient was continuously monitored during the procedure by the interventional radiology nurse under my direct supervision. CONTRAST:  Carbon dioxide FLUOROSCOPY TIME:  Fluoroscopy Time: 3 minutes, 18 seconds, 18 mGy COMPLICATIONS: None immediate. PROCEDURE:  Informed consent was obtained for an IVC filter placement from the patient's daughter. Ultrasound demonstrated a patent right internal jugular vein. Ultrasound images were obtained for documentation. The right neck was prepped and draped in a sterile fashion. Maximal barrier sterile technique was utilized including caps, mask, sterile gowns, sterile gloves, sterile drape, hand hygiene and skin antiseptic. The skin was anesthetized with 1% lidocaine. A 21 gauge needle was directed into the vein with ultrasound guidance and a micropuncture dilator set was  placed. A wire was advanced into the IVC. The filter sheath was advanced over the wire into the IVC. An IVC venogram was performed with carbon dioxide. Bilateral renal veins were cannulated using a 5 French catheter and Bentson wire. Bilateral renal veins are identified. Fluoroscopic images were obtained for documentation. A Bard Denali filter was deployed below the lowest renal vein. Post placement images of the filter were obtained. The vascular sheath was removed with manual compression. Bandage placed over the puncture site. FINDINGS: IVC was patent. Bilateral renal veins were identified. The filter was deployed below the lowest renal vein. IMPRESSION: Successful placement of a retrievable IVC filter. PLAN: This IVC filter is potentially retrievable. The patient will be assessed for filter retrieval by Interventional Radiology in approximately 8-12 weeks. Further recommendations regarding filter retrieval, continued surveillance or declaration of device permanence, will be made at that time. Electronically Signed   By: Richarda Overlie M.D.   On: 05/24/2020 17:57   DG Chest Port 1 View  Result Date: 06/13/2020 CLINICAL DATA:  Shortness of breath, COVID-19 positive. EXAM: PORTABLE CHEST 1 VIEW COMPARISON:  June 11, 2020. FINDINGS: The heart size and mediastinal contours are within normal limits. No pneumothorax is noted. Mild bibasilar subsegmental atelectasis  or infiltrates are noted. Small pleural effusions may be present. The visualized skeletal structures are unremarkable. IMPRESSION: Mild bibasilar subsegmental atelectasis or infiltrates are noted with possible small pleural effusions. Electronically Signed   By: Lupita Raider M.D.   On: 06/13/2020 08:06   DG Chest Port 1 View  Result Date: 06/11/2020 CLINICAL DATA:  Shortness of breath. EXAM: PORTABLE CHEST 1 VIEW COMPARISON:  June 10, 2020. FINDINGS: The heart size and mediastinal contours are within normal limits. No pneumothorax is noted. Stable bibasilar opacities are noted concerning for atelectasis or infiltrates with associated pleural effusions, right greater than left. The visualized skeletal structures are unremarkable. IMPRESSION: Stable bibasilar opacities are noted concerning for atelectasis or infiltrates with associated pleural effusions, right greater than left. Electronically Signed   By: Lupita Raider M.D.   On: 06/11/2020 11:33   DG Chest Port 1 View  Result Date: 06/10/2020 CLINICAL DATA:  Shortness of breath EXAM: PORTABLE CHEST 1 VIEW COMPARISON:  05/26/2020 FINDINGS: Numerous leads and wires project over the chest. Midline trachea. Normal heart size. Suspect layering tiny bilateral pleural effusions. No pneumothorax. Left greater than right base airspace disease is slightly improved, given differences in technique. Suspect underlying mild pulmonary venous congestion. Removal of left internal jugular line. IMPRESSION: Overall mildly improved aeration. Persistent layering bilateral pleural effusions and slightly improved airspace disease. Suspect developing pulmonary venous congestion. Electronically Signed   By: Jeronimo Greaves M.D.   On: 06/10/2020 15:58   DG CHEST PORT 1 VIEW  Result Date: 05/26/2020 CLINICAL DATA:  Acute respiratory failure.  Hypoxia. EXAM: PORTABLE CHEST 1 VIEW COMPARISON:  05/22/2020. FINDINGS: Interim extubation and removal of NG tube. Left IJ line in  stable position. Heart size normal. Diffuse left lung infiltrate. Right base infiltrate. No prominent pleural effusion. No pneumothorax. IMPRESSION: 1. Interim extubation and removal of NG tube. Left IJ line stable position. 2. Diffuse left lung infiltrate and right base infiltrate. Electronically Signed   By: Maisie Fus  Register   On: 05/26/2020 05:50   DG CHEST PORT 1 VIEW  Result Date: 05/22/2020 CLINICAL DATA:  Central line placement, intubation EXAM: PORTABLE CHEST 1 VIEW COMPARISON:  05/21/2020 FINDINGS: Left central line has been placed with the tip in the SVC. No pneumothorax.  Endotracheal tube is 5 cm above the carina. Patchy airspace disease throughout the left lung. No focal opacity on the right. Heart is normal size. IMPRESSION: Endotracheal tube 5 cm above the carina. Left central line tip in the SVC. No pneumothorax. Stable patchy left lung airspace disease. Electronically Signed   By: Charlett Nose M.D.   On: 05/22/2020 21:06   DG Chest Port 1 View  Result Date: 05/21/2020 CLINICAL DATA:  Cough, lower back pain, has not eaten 5 days, emesis EXAM: PORTABLE CHEST 1 VIEW COMPARISON:  Radiograph 04/19/2010, CT 04/18/2010 FINDINGS: Heterogeneous opacities present predominantly along the periphery of the left lung and minimally in the right lung base as well. No pneumothorax or effusion. The aorta is calcified. The remaining cardiomediastinal contours are unremarkable. No acute osseous or soft tissue abnormality. Degenerative changes are present in the imaged spine and shoulders. Telemetry leads overlie the chest. IMPRESSION: Heterogeneous opacities throughout the lungs, greatest in the left lung periphery concerning for pneumonia including potential viral etiology. Aortic Atherosclerosis (ICD10-I70.0). Electronically Signed   By: Kreg Shropshire M.D.   On: 05/21/2020 03:50   DG Abd Portable 1V  Result Date: 05/23/2020 CLINICAL DATA:  Encounter for orogastric tube placement. EXAM: PORTABLE ABDOMEN -  1 VIEW COMPARISON:  06/09/2009 and chest radiograph 05/22/2020 FINDINGS: Gastric tube extends into the abdomen and the tip is in the midline of the lower abdomen, likely in the distal stomach body region. Again noted are patchy densities at the left lung base. Few gas-filled loops of bowel in the abdomen. IMPRESSION: Orogastric tube in the lower abdominal midline and likely in the distal stomach body region. Electronically Signed   By: Richarda Overlie M.D.   On: 05/23/2020 12:17   ECHOCARDIOGRAM COMPLETE  Result Date: 05/23/2020    ECHOCARDIOGRAM REPORT   Patient Name:   Dwayne Barnes Date of Exam: 05/23/2020 Medical Rec #:  [id number]      Height:       72.0 in Accession #:    [id number]     Weight:       142.4 lb Date of Birth:  [birth year]      BSA:          1.844 m Patient Age:    69 years       BP:           112/85 mmHg Patient Gender: M              HR:           96 bpm. Exam Location:  Inpatient Procedure: 2D Echo, Cardiac Doppler and Color Doppler STAT ECHO Indications:    I26.02 Pulmonary embolus  History:        Patient has no prior history of Echocardiogram examinations.                 Arrythmias:Cardiac Arrest; Signs/Symptoms:Dyspnea, Hypotension                 and Altered Mental Status. Covid 19 positive.  Sonographer:    Sheralyn Boatman RDCS Referring Phys: [id number] Martina Sinner  Sonographer Comments: Technically difficult study due to poor echo windows and echo performed with patient supine and on artificial respirator. IMPRESSIONS  1. Severe RV failure and cor pulmonale.  2. Abnormal septal motion and septal flattening consistent with RV pressure overload. Left ventricular ejection fraction, by estimation, is 50 to 55%. The left ventricle has low normal function. The left ventricle has no regional wall motion abnormalities.  Left ventricular diastolic parameters were normal.  3. Right ventricular systolic function is severely reduced. The right ventricular size is severely enlarged. There is mildly  elevated pulmonary artery systolic pressure.  4. The mitral valve is normal in structure. No evidence of mitral valve regurgitation. No evidence of mitral stenosis.  5. The aortic valve is normal in structure. Aortic valve regurgitation is not visualized. No aortic stenosis is present.  6. The inferior vena cava is dilated in size with <50% respiratory variability, suggesting right atrial pressure of 15 mmHg. FINDINGS  Left Ventricle: Abnormal septal motion and septal flattening consistent with RV pressure overload. Left ventricular ejection fraction, by estimation, is 50 to 55%. The left ventricle has low normal function. The left ventricle has no regional wall motion abnormalities. The left ventricular internal cavity size was normal in size. There is no left ventricular hypertrophy. Left ventricular diastolic parameters were normal. Right Ventricle: The right ventricular size is severely enlarged. Right vetricular wall thickness was not assessed. Right ventricular systolic function is severely reduced. There is mildly elevated pulmonary artery systolic pressure. The tricuspid regurgitant velocity is 2.67 m/s, and with an assumed right atrial pressure of 8 mmHg, the estimated right ventricular systolic pressure is 36.5 mmHg. Left Atrium: Left atrial size was normal in size. Right Atrium: Right atrial size was normal in size. Pericardium: There is no evidence of pericardial effusion. Mitral Valve: The mitral valve is normal in structure. No evidence of mitral valve regurgitation. No evidence of mitral valve stenosis. Tricuspid Valve: The tricuspid valve is normal in structure. Tricuspid valve regurgitation is mild . No evidence of tricuspid stenosis. Aortic Valve: The aortic valve is normal in structure. Aortic valve regurgitation is not visualized. No aortic stenosis is present. Pulmonic Valve: The pulmonic valve was normal in structure. Pulmonic valve regurgitation is not visualized. No evidence of pulmonic  stenosis. Aorta: The aortic root is normal in size and structure. Venous: The inferior vena cava is dilated in size with less than 50% respiratory variability, suggesting right atrial pressure of 15 mmHg. IAS/Shunts: No atrial level shunt detected by color flow Doppler. Additional Comments: Severe RV failure and cor pulmonale.  LEFT VENTRICLE PLAX 2D LVIDd:         3.70 cm     Diastology LVIDs:         2.80 cm     LV e' medial:    4.46 cm/s LV PW:         1.10 cm     LV E/e' medial:  4.9 LV IVS:        1.10 cm     LV e' lateral:   6.97 cm/s LVOT diam:     2.10 cm     LV E/e' lateral: 3.1 LV SV:         28 LV SV Index:   15 LVOT Area:     3.46 cm  LV Volumes (MOD) LV vol d, MOD A2C: 22.6 ml LV vol d, MOD A4C: 25.5 ml LV vol s, MOD A2C: 11.1 ml LV vol s, MOD A4C: 11.7 ml LV SV MOD A2C:     11.5 ml LV SV MOD A4C:     25.5 ml LV SV MOD BP:      13.7 ml RIGHT VENTRICLE            IVC RV S prime:     8.84 cm/s  IVC diam: 2.50 cm TAPSE (M-mode): 1.3 cm LEFT ATRIUM  Index      RIGHT ATRIUM           Index LA diam:      2.80 cm 1.52 cm/m RA Area:     14.30 cm LA Vol (A4C): 10.5 ml 5.69 ml/m RA Volume:   42.70 ml  23.15 ml/m  AORTIC VALVE LVOT Vmax:   53.80 cm/s LVOT Vmean:  41.900 cm/s LVOT VTI:    0.080 m  AORTA Ao Root diam: 3.60 cm Ao Asc diam:  3.50 cm MITRAL VALVE               TRICUSPID VALVE MV Area (PHT): 3.27 cm    TR Peak grad:   28.5 mmHg MV Decel Time: 232 msec    TR Vmax:        267.00 cm/s MV E velocity: 21.94 cm/s MV A velocity: 41.30 cm/s  SHUNTS MV E/A ratio:  0.53        Systemic VTI:  0.08 m                            Systemic Diam: 2.10 cm Charlton Haws MD Electronically signed by Charlton Haws MD Signature Date/Time: 05/23/2020/10:46:38 AM    Final    CT RENAL STONE STUDY  Result Date: 05/21/2020 CLINICAL DATA:  69 year old male with low back pain, flank pain, decreased p.o. EXAM: CT ABDOMEN AND PELVIS WITHOUT CONTRAST TECHNIQUE: Multidetector CT imaging of the abdomen and pelvis was  performed following the standard protocol without IV contrast. COMPARISON:  Noncontrast CT Abdomen and Pelvis 10/28/2008. Portable chest radiograph 04/19/2010. FINDINGS: Lower chest: Patchy, irregular peribronchial and peripheral scattered pulmonary opacity at both lung bases. This seems to be new from last month. Underlying probable pulmonary hyperinflation. Some associated lung base bronchiectasis. No cavitating areas identified. No cardiomegaly, pericardial effusion or pleural effusion. Hepatobiliary: Negative noncontrast liver and gallbladder. Pancreas: Negative. Spleen: Negative. Adrenals/Urinary Tract: Bulky left nephrolithiasis measuring 13 mm at the renal pelvis. Additional left lower pole stone. No hydronephrosis. Negative noncontrast right kidney. No hydroureter. Unremarkable urinary bladder. Incidental pelvic phleboliths. Stomach/Bowel: Decompressed large bowel. There is long segment circumferential wall thickening in the right colon, up to the 12 mm series 4, image 49) with some areas of adjacent mesenteric stranding (coronal image 43). The wall thickening gradually decreases through the transverse colon. No free air. No free fluid. The terminal ileum appear spared. Appendix seems to remain normal on coronal image 32. No dilated small bowel.  Decompressed stomach and duodenum. Vascular/Lymphatic: Normal caliber abdominal aorta. Mild calcified atherosclerosis. Vascular patency is not evaluated in the absence of IV contrast. No lymphadenopathy identified in the absence of contrast. Reproductive: Negative. Other: Previous lower abdominal ventral hernia repair with mesh. No pelvic free fluid. Musculoskeletal: Degenerative changes in the spine. No acute osseous abnormality identified. IMPRESSION: 1. In the abdomen there is circumferential bowel wall thickening and mesenteric stranding involving the right colon. Wall thickening gradually abates through the transverse colon. This is compatible with Acute  Nonspecific Colitis. 2. But the lung bases are also abnormal with patchy and irregular peribronchial and peripheral pulmonary opacity suspicious for acute viral/atypical respiratory infection. No areas of cavitation to suggest septic emboli. No pleural effusion. 3. Bulky left nephrolithiasis, but no obstructive uropathy. 4. Previous lower abdominal hernia repair with mesh. Electronically Signed   By: Odessa Fleming M.D.   On: 05/21/2020 02:46   VAS [place] LOWER EXTREMITY VENOUS (DVT)  Result Date: 05/24/2020  Lower  Venous DVT Study Indications: Covid-19, pulmonary embolism.  Risk Factors: Confirmed PE. Anticoagulation: Heparin. Limitations: Body habitus, ventilation, did not perform compressions in thigh and popliteal fossa of the left lower extremity secondary to mobile thrombus, bandages and line. Comparison Study: No prior study Performing Technologist: Sherren Kerns RVS  Examination Guidelines: A complete evaluation includes B-mode imaging, spectral Doppler, color Doppler, and power Doppler as needed of all accessible portions of each vessel. Bilateral testing is considered an integral part of a complete examination. Limited examinations for reoccurring indications may be performed as noted. The reflux portion of the exam is performed with the patient in reverse Trendelenburg.  +---------+---------------+---------+-----------+----------+--------------+ RIGHT    CompressibilityPhasicitySpontaneityPropertiesThrombus Aging +---------+---------------+---------+-----------+----------+--------------+ CFV      Full           Yes      No                                  +---------+---------------+---------+-----------+----------+--------------+ SFJ      Full                                                        +---------+---------------+---------+-----------+----------+--------------+ FV Prox  Full                                                         +---------+---------------+---------+-----------+----------+--------------+ FV Mid   Full                                                        +---------+---------------+---------+-----------+----------+--------------+ FV DistalFull                                                        +---------+---------------+---------+-----------+----------+--------------+ PFV      Full                                                        +---------+---------------+---------+-----------+----------+--------------+ POP      Full           Yes      No                                  +---------+---------------+---------+-----------+----------+--------------+ PTV      Full                                                        +---------+---------------+---------+-----------+----------+--------------+  PERO     Full                                                        +---------+---------------+---------+-----------+----------+--------------+   +---------+---------------+---------+-----------+----------+-------------------+ LEFT     CompressibilityPhasicitySpontaneityPropertiesThrombus Aging      +---------+---------------+---------+-----------+----------+-------------------+ CFV                                                   not visualized                                                            secondary to                                                              bandage/line        +---------+---------------+---------+-----------+----------+-------------------+ SFJ                                                   not visualized                                                            secondary to                                                              bandage/line        +---------+---------------+---------+-----------+----------+-------------------+ FV Prox  Full                                         patent  by color     +---------+---------------+---------+-----------+----------+-------------------+ FV Mid                                                patent by color     +---------+---------------+---------+-----------+----------+-------------------+ FV Distal  patent by color     +---------+---------------+---------+-----------+----------+-------------------+ PFV                                                   patent by color     +---------+---------------+---------+-----------+----------+-------------------+ POP                                                   patent by color     +---------+---------------+---------+-----------+----------+-------------------+ PTV      Full                                                             +---------+---------------+---------+-----------+----------+-------------------+ PERO     Full                                                             +---------+---------------+---------+-----------+----------+-------------------+     Summary: RIGHT: - There is no evidence of deep vein thrombosis in the lower extremity.  vessels are dilated with significantly sluggish flow, throughout  LEFT: - Findings consistent with acute deep vein thrombosis involving the left femoral vein. - Acute, mobile thrombus noted in the proximal and mid femoral vein. Vessels are dilated with significantly sluggish flow, throughout.  *See table(s) above for measurements and observations. Electronically signed by Waverly Ferrari MD on 05/24/2020 at 6:21:22 PM.    Final     Micro Results    Recent Results (from the past 240 hour(s))  Culture, blood (routine x 2)     Status: None   Collection Time: 06/10/20  3:35 PM   Specimen: BLOOD  Result Value Ref Range Status   Specimen Description BLOOD LEFT ANTECUBITAL  Final   Special Requests   Final    BOTTLES DRAWN AEROBIC ONLY Blood Culture adequate volume    Culture   Final    NO GROWTH 5 DAYS Performed at [facility], [address] [place], [place] [address]    Report Status 06/15/2020 FINAL  Final  Culture, blood (routine x 2)     Status: None   Collection Time: 06/10/20  3:46 PM   Specimen: BLOOD LEFT ARM  Result Value Ref Range Status   Specimen Description BLOOD LEFT ARM  Final   Special Requests   Final    BOTTLES DRAWN AEROBIC ONLY Blood Culture adequate volume   Culture   Final    NO GROWTH 5 DAYS Performed at [facility], [address] [place], [place] [address]    Report Status 06/15/2020 FINAL  Final  MRSA PCR Screening     Status: Abnormal   Collection Time: 06/10/20  4:27 PM   Specimen: Nasal Mucosa; Nasopharyngeal  Result Value Ref Range Status   MRSA by PCR POSITIVE (A) NEGATIVE Final    Comment:  The GeneXpert MRSA Assay (FDA approved for NASAL specimens only), is one component of a comprehensive MRSA colonization surveillance program. It is not intended to diagnose MRSA infection nor to guide or monitor treatment for MRSA infections. RESULT CALLED TO, READ BACK BY AND VERIFIED WITH: Kerrin Champagne RN 06/10/20 at 1943 sk  Performed at [facility], [address], [place], [place] [address]     Today   Subjective    Dwayne Barnes today has no headache,no chest abdominal pain,no new weakness tingling or numbness, feels much better.   Objective   Blood pressure 108/62, pulse 86, temperature 98.8 F (37.1 C), temperature source Oral, resp. rate 16, height 6' (1.829 m), weight 58.8 kg, SpO2 95 %.   Intake/Output Summary (Last 24 hours) at 06/16/2020 0901 Last data filed at 06/16/2020 0545 Gross per 24 hour  Intake 840 ml  Output 700 ml  Net 140 ml    Exam  Awake Alert, No new F.N deficits, extremely hard of hearing [place].AT,PERRAL Supple Neck,No JVD, No cervical lymphadenopathy appriciated.  Symmetrical Chest wall movement, Good air movement bilaterally, CTAB RRR,No  Gallops,Rubs or new Murmurs, No Parasternal Heave +ve B.Sounds, Abd Soft, Non tender, No organomegaly appriciated, No rebound -guarding or rigidity. No Cyanosis, Clubbing or edema, No new Rash or bruise   Data Review   CBC w Diff:  Lab Results  Component Value Date   WBC 9.8 06/16/2020   HGB 8.4 (L) 06/16/2020   HCT 26.9 (L) 06/16/2020   PLT 673 (H) 06/16/2020   LYMPHOPCT 10 06/14/2020   MONOPCT 6 06/14/2020   EOSPCT 1 06/14/2020   BASOPCT 1 06/14/2020    CMP:  Lab Results  Component Value Date   NA 132 (L) 06/14/2020   K 4.4 06/14/2020   CL 96 (L) 06/14/2020   CO2 27 06/14/2020   BUN 10 06/14/2020   CREATININE 0.86 06/14/2020   PROT 5.3 (L) 06/14/2020   ALBUMIN 1.5 (L) 06/14/2020   BILITOT 0.7 06/14/2020   ALKPHOS 255 (H) 06/14/2020   AST 42 (H) 06/14/2020   ALT 68 (H) 06/14/2020  .   Total Time in preparing paper work, data evaluation and todays exam - 35 minutes  Susa Raring M.D on 06/16/2020 at 9:01 AM  [facility]   Office  [phone]

## 2020-06-11 ENCOUNTER — Inpatient Hospital Stay (HOSPITAL_COMMUNITY): Payer: Medicare HMO

## 2020-06-11 DIAGNOSIS — U071 COVID-19: Secondary | ICD-10-CM | POA: Diagnosis not present

## 2020-06-11 LAB — LACTIC ACID, PLASMA: Lactic Acid, Venous: 1 mmol/L (ref 0.5–1.9)

## 2020-06-11 LAB — CBC WITH DIFFERENTIAL/PLATELET
Abs Immature Granulocytes: 0.19 10*3/uL — ABNORMAL HIGH (ref 0.00–0.07)
Basophils Absolute: 0.1 10*3/uL (ref 0.0–0.1)
Basophils Relative: 1 %
Eosinophils Absolute: 0.2 10*3/uL (ref 0.0–0.5)
Eosinophils Relative: 2 %
HCT: 26.1 % — ABNORMAL LOW (ref 39.0–52.0)
Hemoglobin: 8.7 g/dL — ABNORMAL LOW (ref 13.0–17.0)
Immature Granulocytes: 2 %
Lymphocytes Relative: 8 %
Lymphs Abs: 0.9 10*3/uL (ref 0.7–4.0)
MCH: 28.4 pg (ref 26.0–34.0)
MCHC: 33.3 g/dL (ref 30.0–36.0)
MCV: 85.3 fL (ref 80.0–100.0)
Monocytes Absolute: 0.6 10*3/uL (ref 0.1–1.0)
Monocytes Relative: 6 %
Neutro Abs: 8.6 10*3/uL — ABNORMAL HIGH (ref 1.7–7.7)
Neutrophils Relative %: 81 %
Platelets: 534 10*3/uL — ABNORMAL HIGH (ref 150–400)
RBC: 3.06 MIL/uL — ABNORMAL LOW (ref 4.22–5.81)
RDW: 14.5 % (ref 11.5–15.5)
WBC: 10.5 10*3/uL (ref 4.0–10.5)
nRBC: 0 % (ref 0.0–0.2)

## 2020-06-11 LAB — COMPREHENSIVE METABOLIC PANEL
ALT: 61 U/L — ABNORMAL HIGH (ref 0–44)
AST: 33 U/L (ref 15–41)
Albumin: 1.3 g/dL — ABNORMAL LOW (ref 3.5–5.0)
Alkaline Phosphatase: 197 U/L — ABNORMAL HIGH (ref 38–126)
Anion gap: 8 (ref 5–15)
BUN: 10 mg/dL (ref 8–23)
CO2: 24 mmol/L (ref 22–32)
Calcium: 7.9 mg/dL — ABNORMAL LOW (ref 8.9–10.3)
Chloride: 98 mmol/L (ref 98–111)
Creatinine, Ser: 1.01 mg/dL (ref 0.61–1.24)
GFR, Estimated: >60 mL/min (ref >60)
Glucose, Bld: 114 mg/dL — ABNORMAL HIGH (ref 70–99)
Potassium: 4.2 mmol/L (ref 3.5–5.1)
Sodium: 130 mmol/L — ABNORMAL LOW (ref 135–145)
Total Bilirubin: 0.8 mg/dL (ref 0.3–1.2)
Total Protein: 4.8 g/dL — ABNORMAL LOW (ref 6.5–8.1)

## 2020-06-11 LAB — PROCALCITONIN: Procalcitonin: 0.13 ng/mL

## 2020-06-11 LAB — MAGNESIUM: Magnesium: 1.9 mg/dL (ref 1.7–2.4)

## 2020-06-11 LAB — D-DIMER, QUANTITATIVE: D-Dimer, Quant: 2.83 ug/mL-FEU — ABNORMAL HIGH (ref 0.00–0.50)

## 2020-06-11 LAB — C-REACTIVE PROTEIN: CRP: 21.1 mg/dL — ABNORMAL HIGH (ref <1.0)

## 2020-06-11 LAB — GLUCOSE, CAPILLARY
Glucose-Capillary: 94 mg/dL (ref 70–99)
Glucose-Capillary: 92 mg/dL (ref 70–99)
Glucose-Capillary: 104 mg/dL — ABNORMAL HIGH (ref 70–99)
Glucose-Capillary: 105 mg/dL — ABNORMAL HIGH (ref 70–99)
Glucose-Capillary: 119 mg/dL — ABNORMAL HIGH (ref 70–99)

## 2020-06-11 LAB — BRAIN NATRIURETIC PEPTIDE: B Natriuretic Peptide: 204.3 pg/mL — ABNORMAL HIGH (ref 0.0–100.0)

## 2020-06-11 MED ORDER — MUPIROCIN 2 % EX OINT
1.0000 "application " | TOPICAL_OINTMENT | Freq: Two times a day (BID) | CUTANEOUS | Status: AC
  Administered 2020-06-11 – 2020-06-15 (×10): 1 via NASAL
  Filled 2020-06-11 (×5): qty 22

## 2020-06-11 MED ORDER — CHLORHEXIDINE GLUCONATE CLOTH 2 % EX PADS
6.0000 | MEDICATED_PAD | Freq: Every day | CUTANEOUS | Status: AC
  Administered 2020-06-11 – 2020-06-15 (×5): 6 via TOPICAL

## 2020-06-11 NOTE — Progress Notes (Signed)
PROGRESS NOTE                                                                                                                                                                                                             Patient Demographics:    Dwayne Barnes, is a 69 y.o. male, DOB - 1951-02-14, ZOX:[id number]  Outpatient Primary MD for the patient is [name], PA-C   Admit date - 05/20/2020   LOS - 21  Chief Complaint  Patient presents with  . Fatigue       Brief Narrative: Patient is a 69 y.o. male with no significant past medical history-presented to the ED on 11/17 with nausea/vomiting, weakness and back pain-further evaluation revealed COVID-19 infection and a large PE.  Patient was started on IV heparin infusion along with steroid/Remdesivir-on 11/19-patient had a cardiac arrest-was intubated during the code-total downtime of around 6 minutes before ROSC.  Patient was subsequently transferred to the ICU-unfortunately soon after arrival in the ICU-he had another cardiac arrest-and 4 rounds of CPR were provided with ROSC.  Patient was provided supportive care-he subsequently improved-self extubated on 11/23-he was [facility] on 11/25.  Further hospital course complicated by diarrhea secondary to C. difficile along with AKI and hyponatremia.See below for further details.   COVID-19 vaccinated status: Vaccinated  Significant Events: 11/17>> Admit to [facility] for PE/COVID-19 infection 11/19>> PEA cardiac arrest x 2-given TPA-intubated-transfer to ICU 11/23>> Self extubated 11/25>> transfer to [facility] 11/29>> worsening diarrhea-CT abdomen with progressive colitis  Significant studies: 11/18>>CT renal stone: non specific colitis, GGO's, left nephrolithiasis without obstructive uropathy. 11/18>>CTA chest: large PC in LLL, GGO's LLL. 11/20 echo>> EF 50-55%, RV systolic function severely  reduced. 11/21>> B/L extremity Doppler: acute DVTin left femoral vein, acute mobile thrombus in the proximal femoral vein. 11/23>>CT Head: unremarkable   11/29>> CT abdomen/pelvis>> progressive colitis  COVID-19 medications: Steroids: 11/17>>11/26 Remdesivir: 11/17>> 11/22  Antibiotics: Oral vancomycin: 11/30>>  Microbiology data: 11/18>> urine culture: Insignificant growth 11/30>> stool C. difficile: +ve 11/30>> GI pathogen panel: negative  Procedures: ETT>>11/19 - 11/23 IVC filter placement by IR>>11/21.  Consults: PCCM, IR  DVT prophylaxis: Therapeutic Lovenox>> Eliquis    Subjective:   Patient in bed, appears comfortable, denies any headache, no fever, no chest pain or pressure, no shortness of breath , no abdominal pain. Improving  diarrhea.   Assessment  & Plan :   Cardiac arrest due to large PE: S/p TPA-was on therapeutic Lovenox-we will transition to Eliquis today. PE likely provoked by COVID-19 infection.  PCCM MD discussed with Dr. Illa Level indication for any acute intervention.  Continue telemetry monitoring.  Echo as above.  Large PE with DVT: Continue therapeutic anticoagulation-he is s/p TPA on 11/19 when he had cardiac arrest.  Patient is s/p IVC filter given mobile thrombus seen on lower extremity Doppler.  Once oral intake was stabilized-he was transitioned to Eliquis.  Acute hypoxic respiratory failure: Due to PE/cardiac arrest/COVID-19 pneumonia-self extubated on 11/23-currently stable on room air.  Acute metabolic encephalopathy/ICU delirium/possible anoxic injury: Mental status continues to slowly improve-getting more fluent with responses.  CT head without acute changes.  Continue supportive care.  Minimize all sedating medications.    Dysphagia: In the setting of cardiac arrest-seen by SLP-required NG tube-subsequently seen by SLP-diet gradually upgraded to a dysphagia 3 diet.  C. difficile colitis: Diarrhea improving-leukocytosis/AKI all getting  better.  Continue oral vancomycin.  Fever evening of 06/10/2020.  Clinically appears to have aspirated, MRSA nasal PCR positive, UA also positive, follow blood and urine cultures, trend procalcitonin, given 1 dose of IV vancomycin and 1 dose of oral Keflex on 06/10/2020.  Due to his underlying severe C. difficile trying to avoid antibiotics unless absolutely needed.  We will continue to monitor clinically.  Currently afebrile and looks nontoxic.  Speech following, continue to encourage patient and staff to sit in the chair for all meals.  AKI: Likely hemodynamically mediated-due to diarrhea-has resolved with IV fluids and improvement in diarrhea.  COVID-19 pneumonia: Completed a course of Remdesivir-an steroids.  Hypoxia has resolved.   Paroxysmal atrial fibrillation: appears to be back in sinus rhythm-on anticoagulation for PE.  Echo as above.    Normocytic anemia: Secondary to critical illness-stable for monitoring-no indication for PRBC transfusion.  Transaminitis: Likely secondary to COVID-19-downtrending.  Deconditioning/debility: Secondary to critical illness- PT/OT eval completed-recommendations are for SNF, SNF does not want him at the SNF till his stools are formed and C. difficile has clinically resolved.  Monitoring closely.  Hard of hearing - supportive care.  Hyponatremia.  Persistent.  Became hypotensive 06/08/2020 hence IV fluids, other electrolytes including low serum uric acid point more towards SIADH but due to hypotension requires fluids.  Will monitor.  Also drinks more than 5-6 bottles of soda every day, requested to cut down.   Recent Labs  Lab 06/06/20 0520 06/07/20 0500 06/08/20 0514 06/09/20 0054 06/10/20 1548 06/11/20 0116  WBC 8.2 8.1 9.4 9.4 10.5 10.5  HGB 9.4* 9.6* 9.2* 8.1* 8.5* 8.7*  HCT 28.5* 29.6* 28.5* 25.4* 25.6* 26.1*  PLT 430* 490* 510* 452* 582* 534*  CRP  --   --   --   --  22.6* 21.1*  BNP  --   --   --   --   --  204.3*  DDIMER  --   --   --    --   --  2.83*  PROCALCITON  --   --   --   --  0.15 0.13  AST 88* 74* 79* 46*  --  33  ALT 100* 103* 105* 78*  --  61*  ALKPHOS 207* 201* 203* 182*  --  197*  BILITOT 1.0 1.0 1.0 0.6  --  0.8  ALBUMIN 1.5* 1.5* 1.5* 1.4*  --  1.3*  LATICACIDVEN  --   --   --   --   --  1.0       GI prophylaxis: H2 blocker.  Condition -  Guarded  Family Communication  :  Daughter Audel Coakley [phone])  updated over the phone 06/05/20 by previous MD.  I called on 06/06/2020, 06/09/20, 06/10/20, 06/11/20-mailbox full at 10:10 AM ,   Code Status :  Full Code  Diet :   Diet Order            DIET DYS 3 Room service appropriate? No; Fluid consistency: Thin; Fluid restriction: 1200 mL Fluid  Diet effective now                  Disposition Plan  :  Status is: Inpatient  Remains inpatient appropriate because:Inpatient level of care appropriate due to severity of illness   Dispo: The patient is from: Home              Anticipated d/c is to: SNF              Anticipated d/c date is: > 1 day              Patient currently is not medically stable to d/c.   Barriers to discharge: Persistent hyponatremia  Antimicorbials  :    Anti-infectives (From admission, onward)   Start     Dose/Rate Route Frequency Ordered Stop   06/10/20 2230  vancomycin (VANCOREADY) IVPB 1250 mg/250 mL        1,250 mg 166.7 mL/hr over 90 Minutes Intravenous  Once 06/10/20 2139 06/11/20 0140   06/10/20 2230  cephALEXin (KEFLEX) capsule 500 mg        500 mg Oral  Once 06/10/20 2139 06/10/20 2155   06/10/20 0000  vancomycin (VANCOCIN) 50 mg/mL oral solution        125 mg Oral 4 times daily 06/10/20 1053 06/15/20 2359   06/02/20 1000  vancomycin (VANCOCIN) 50 mg/mL oral solution 125 mg        125 mg Oral 4 times daily 06/02/20 0800 06/12/20 0959   05/22/20 1000  remdesivir 100 mg in sodium chloride 0.9 % 100 mL IVPB       "Followed by" Linked Group Details   100 mg 200 mL/hr over 30 Minutes Intravenous Daily 05/21/20  0857 05/25/20 1154   05/21/20 1030  remdesivir 200 mg in sodium chloride 0.9% 250 mL IVPB  Status:  Discontinued       "Followed by" Linked Group Details   200 mg 580 mL/hr over 30 Minutes Intravenous Once 05/21/20 0857 05/27/20 0734   05/21/20 0330  metroNIDAZOLE (FLAGYL) tablet 500 mg        500 mg Oral  Once 05/21/20 0320 05/21/20 0411   05/20/20 2330  cefTRIAXone (ROCEPHIN) 2 g in sodium chloride 0.9 % 100 mL IVPB        2 g 200 mL/hr over 30 Minutes Intravenous  Once 05/20/20 2324 05/21/20 0331      Inpatient Medications  Scheduled Meds: . apixaban  5 mg Oral BID  . Chlorhexidine Gluconate Cloth  6 each Topical Q0600  . famotidine  20 mg Oral Daily  . feeding supplement  237 mL Oral TID BM  . insulin aspart  0-9 Units Subcutaneous TID AC & HS  . mupirocin ointment  1 application Nasal BID  . sodium chloride flush  10-40 mL Intracatheter Q12H  . vancomycin  125 mg Oral QID   Continuous Infusions: . lactated ringers 75 mL/hr at 06/10/20 1620   PRN Meds:.acetaminophen (TYLENOL) oral liquid  160 mg/5 mL, [DISCONTINUED] ondansetron **OR** ondansetron (ZOFRAN) IV   Time Spent in minutes  25   See all Orders from today for further details   Susa Raring M.D on 06/11/2020 at 10:07 AM  To page go to [email or web address] - use universal password  [facility] -  Office  [phone]    Objective:   Vitals:   06/10/20 1734 06/10/20 2140 06/11/20 0410 06/11/20 0747  BP:   106/67 105/64  Pulse:   89 84  Resp:   20 18  Temp: 99.5 F (37.5 C) (!) 97.4 F (36.3 C) 98 F (36.7 C) 98.9 F (37.2 C)  TempSrc: Rectal Oral Axillary Oral  SpO2:   94% 97%  Weight:   60.3 kg   Height:        Wt Readings from Last 3 Encounters:  06/11/20 60.3 kg  12/20/14 66 kg     Intake/Output Summary (Last 24 hours) at 06/11/2020 1007 Last data filed at 06/11/2020 0910 Gross per 24 hour  Intake 477 ml  Output 1200 ml  Net -723 ml     Physical Exam  Awake Alert, No new F.N  deficits, Normal affect, extremely hard of hearing [place].AT,PERRAL Supple Neck,No JVD, No cervical lymphadenopathy appriciated.  Symmetrical Chest wall movement, Good air movement bilaterally, CTAB RRR,No Gallops, Rubs or new Murmurs, No Parasternal Heave +ve B.Sounds, Abd Soft, No tenderness, No organomegaly appriciated, No rebound - guarding or rigidity. No Cyanosis, Clubbing or edema, No new Rash or bruise     Data Review:    CBC Recent Labs  Lab 06/07/20 0500 06/08/20 0514 06/09/20 0054 06/10/20 1548 06/11/20 0116  WBC 8.1 9.4 9.4 10.5 10.5  HGB 9.6* 9.2* 8.1* 8.5* 8.7*  HCT 29.6* 28.5* 25.4* 25.6* 26.1*  PLT 490* 510* 452* 582* 534*  MCV 87.3 87.4 88.2 86.2 85.3  MCH 28.3 28.2 28.1 28.6 28.4  MCHC 32.4 32.3 31.9 33.2 33.3  RDW 14.4 14.2 14.4 14.6 14.5  LYMPHSABS 0.8 1.2 1.2  --  0.9  MONOABS 0.7 0.7 0.7  --  0.6  EOSABS 0.1 0.1 0.2  --  0.2  BASOSABS 0.0 0.0 0.0  --  0.1    Chemistries  Recent Labs  Lab 06/06/20 0520 06/07/20 0500 06/08/20 0514 06/09/20 0054 06/10/20 1548 06/11/20 0116  NA 131* 128* 127* 130* 128* 130*  K 4.3 4.5 4.5 3.7 4.3 4.2  CL 98 97* 95* 97* 95* 98  CO2 22 23 23 24 24 24   GLUCOSE 103* 104* 108* 142* 112* 114*  BUN 6* 8 11 10 14 10   CREATININE 0.92 0.89 1.08 1.09 1.05 1.01  CALCIUM 8.0* 7.9* 8.0* 7.7* 7.9* 7.9*  MG 2.0 1.8 1.9 1.9 1.9 1.9  AST 88* 74* 79* 46*  --  33  ALT 100* 103* 105* 78*  --  61*  ALKPHOS 207* 201* 203* 182*  --  197*  BILITOT 1.0 1.0 1.0 0.6  --  0.8   ------------------------------------------------------------------------------------------------------------------ No results for input(s): CHOL, HDL, LDLCALC, TRIG, CHOLHDL, LDLDIRECT in the last 72 hours.  Lab Results  Component Value Date   HGBA1C 5.5 05/22/2020   ------------------------------------------------------------------------------------------------------------------ No results for input(s): TSH, T4TOTAL, T3FREE, THYROIDAB in the last 72  hours.  Invalid input(s): FREET3 ------------------------------------------------------------------------------------------------------------------ No results for input(s): VITAMINB12, FOLATE, FERRITIN, TIBC, IRON, RETICCTPCT in the last 72 hours.  Coagulation profile No results for input(s): INR, PROTIME in the last 168 hours.  Recent Labs    06/11/20 0116  DDIMER 2.83*  Cardiac Enzymes No results for input(s): CKMB, TROPONINI, MYOGLOBIN in the last 168 hours.  Invalid input(s): CK ------------------------------------------------------------------------------------------------------------------    Component Value Date/Time   BNP 204.3 (H) 06/11/2020 0116    Micro Results Recent Results (from the past 240 hour(s))  C Difficile Quick Screen (NO PCR Reflex)     Status: Abnormal   Collection Time: 06/02/20  8:41 AM   Specimen: STOOL  Result Value Ref Range Status   C Diff antigen POSITIVE (A) NEGATIVE Final   C Diff toxin POSITIVE (A) NEGATIVE Final   C Diff interpretation Toxin producing C. difficile detected.  Final    Comment: CRITICAL RESULT CALLED TO, READ BACK BY AND VERIFIED WITH: Tenna Child RN 12:05 06/02/20 ([email or web address]) Performed at [facility], [address], [place], [place] [address]   Gastrointestinal Panel by PCR , Stool     Status: None   Collection Time: 06/02/20  9:41 AM   Specimen: STOOL  Result Value Ref Range Status   Campylobacter species NOT DETECTED NOT DETECTED Final   Plesimonas shigelloides NOT DETECTED NOT DETECTED Final   Salmonella species NOT DETECTED NOT DETECTED Final   Yersinia enterocolitica NOT DETECTED NOT DETECTED Final   Vibrio species NOT DETECTED NOT DETECTED Final   Vibrio cholerae NOT DETECTED NOT DETECTED Final   Enteroaggregative E coli (EAEC) NOT DETECTED NOT DETECTED Final   Enteropathogenic E coli (EPEC) NOT DETECTED NOT DETECTED Final   Enterotoxigenic E coli (ETEC) NOT DETECTED NOT DETECTED Final   Shiga like toxin  producing E coli (STEC) NOT DETECTED NOT DETECTED Final   Shigella/Enteroinvasive E coli (EIEC) NOT DETECTED NOT DETECTED Final   Cryptosporidium NOT DETECTED NOT DETECTED Final   Cyclospora cayetanensis NOT DETECTED NOT DETECTED Final   Entamoeba histolytica NOT DETECTED NOT DETECTED Final   Giardia lamblia NOT DETECTED NOT DETECTED Final   Adenovirus F40/41 NOT DETECTED NOT DETECTED Final   Astrovirus NOT DETECTED NOT DETECTED Final   Norovirus GI/GII NOT DETECTED NOT DETECTED Final   Rotavirus A NOT DETECTED NOT DETECTED Final   Sapovirus (I, II, IV, and V) NOT DETECTED NOT DETECTED Final    Comment: Performed at [facility], [address] [place], [place] [address]  Culture, blood (routine x 2)     Status: None (Preliminary result)   Collection Time: 06/10/20  3:35 PM   Specimen: BLOOD  Result Value Ref Range Status   Specimen Description BLOOD LEFT ANTECUBITAL  Final   Special Requests   Final    BOTTLES DRAWN AEROBIC ONLY Blood Culture adequate volume   Culture   Final    NO GROWTH < 24 HOURS Performed at [facility], [address], [place], [place] [address]    Report Status PENDING  Incomplete  Culture, blood (routine x 2)     Status: None (Preliminary result)   Collection Time: 06/10/20  3:46 PM   Specimen: BLOOD LEFT ARM  Result Value Ref Range Status   Specimen Description BLOOD LEFT ARM  Final   Special Requests   Final    BOTTLES DRAWN AEROBIC ONLY Blood Culture adequate volume   Culture   Final    NO GROWTH < 24 HOURS Performed at [facility], [address], [place], [place] [address]    Report Status PENDING  Incomplete  MRSA PCR Screening     Status: Abnormal   Collection Time: 06/10/20  4:27 PM   Specimen: Nasal Mucosa; Nasopharyngeal  Result Value  Ref Range Status   MRSA by PCR POSITIVE (A) NEGATIVE Final    Comment:        The GeneXpert MRSA Assay (FDA approved for NASAL specimens only), is one component of a comprehensive  MRSA colonization surveillance program. It is not intended to diagnose MRSA infection nor to guide or monitor treatment for MRSA infections. RESULT CALLED TO, READ BACK BY AND VERIFIED WITH: Kerrin Champagne RN 06/10/20 at 1943 sk  Performed at [facility], [address], [place], [place] [address]     Radiology Reports CT ABDOMEN PELVIS WO CONTRAST  Result Date: 06/01/2020 CLINICAL DATA:  Left upper quadrant diarrhea, history of COVID-19 pneumonia. EXAM: CT ABDOMEN AND PELVIS WITHOUT CONTRAST TECHNIQUE: Multidetector CT imaging of the abdomen and pelvis was performed following the standard protocol without IV contrast. COMPARISON:  05/21/2020 FINDINGS: Lower chest: Bibasilar consolidation is noted increased when compared with the prior exam. These changes are worse on the left than the right. Stable changes of prior COVID-19 pneumonia are noted. No sizable effusion is seen. Hepatobiliary: No focal liver abnormality is seen. No gallstones, gallbladder wall thickening, or biliary dilatation. Pancreas: Unremarkable. No pancreatic ductal dilatation or surrounding inflammatory changes. Spleen: Normal in size without focal abnormality. Adrenals/Urinary Tract: Adrenal glands are within normal limits. Kidneys demonstrate left renal pelvis stone stable in appearance. No obstructive changes are seen. The bladder is well distended. Stomach/Bowel: Diffuse edematous changes of the colonic wall are seen throughout its course increased when compared with the prior exam consistent with a generalized colitis. Some pericolonic inflammatory changes noted particularly on the right increased from the prior exam. The appendix appears within normal limits. Small bowel and stomach are unremarkable. Vascular/Lymphatic: Atherosclerotic calcifications of the aorta are noted. IVC filter is noted in place. No sizable lymphadenopathy is noted. Reproductive: Prostate is unremarkable. Other: No abdominal wall hernia or  abnormality. No abdominopelvic ascites. Changes of prior hernia repair are noted in the lower abdomen bilaterally Musculoskeletal: Degenerative changes of lumbar spine are noted. IMPRESSION: Increasing colonic wall thickness with pericolonic inflammatory change consistent with progressive colitis. Increasing bilateral lower lobe consolidation without sizable effusion. Some scattered changes consistent with the COVID-19 pneumonia are noted. Stable nonobstructing left renal stone. Electronically Signed   By: Alcide Clever M.D.   On: 06/01/2020 23:59   CT HEAD WO CONTRAST  Result Date: 05/26/2020 CLINICAL DATA:  69 year old male with altered mental status. EXAM: CT HEAD WITHOUT CONTRAST TECHNIQUE: Contiguous axial images were obtained from the base of the skull through the vertex without intravenous contrast. COMPARISON:  Head CT dated 04/18/2010. FINDINGS: Brain: The ventricles and sulci appropriate size for patient's age. The gray-white matter discrimination is preserved. There is no acute intracranial hemorrhage. No mass effect midline shift. No extra-axial fluid collection. Vascular: No hyperdense vessel or unexpected calcification. Skull: Normal. Negative for fracture or focal lesion. Sinuses/Orbits: No acute finding. Other: None IMPRESSION: Unremarkable noncontrast CT of the brain. Electronically Signed   By: Elgie Collard M.D.   On: 05/26/2020 15:45   CT ANGIO CHEST PE W OR WO CONTRAST  Addendum Date: 05/21/2020   ADDENDUM REPORT: 05/21/2020 19:28 ADDENDUM: Critical Value/emergent results were called by telephone at the time of interpretation on 05/21/2020 at 7:27 pm to provider Dr. Dierdre Highman, who verbally acknowledged these results. Of note the airspace disease in the LEFT lower lobe is progressed from CT same day. Differential remains the same. Electronically Signed   By: Genevive Bi M.D.   On: 05/21/2020 19:28  Result Date: 05/21/2020 CLINICAL DATA:  PE suspected, high probability.  COVID-19  infection EXAM: CT ANGIOGRAPHY CHEST WITH CONTRAST TECHNIQUE: Multidetector CT imaging of the chest was performed using the standard protocol during bolus administration of intravenous contrast. Multiplanar CT image reconstructions and MIPs were obtained to evaluate the vascular anatomy. CONTRAST:  75mL OMNIPAQUE IOHEXOL 300 MG/ML  SOLN COMPARISON:  None. FINDINGS: Cardiovascular: Large filling defect within the proximal LEFT lower lobe pulmonary artery. Filling defect spans the bifurcation of the LEFT lower lobe and lingular branch pulmonary branches. This this filling defect/thrombus is occlusive to the LEFT lower lobe. No LEFT upper lobe filling defects identified. No filling defect identified within the RIGHT lung pulmonary arteries. No evidence of RIGHT ventricular strain with the RV to LV ratio less than 1. Coronary artery calcification and aortic atherosclerotic calcification. Mediastinum/Nodes: No axillary or supraclavicular adenopathy. No mediastinal or hilar adenopathy. No pericardial fluid. Esophagus normal. Lungs/Pleura: There is diffuse very fine airspace disease within the LEFT lower lobe involving the majority of the LEFT lower lobe. Similar milder findings in the posterior RIGHT lower lobe and lingula. Upper Abdomen: Limited view of the liver, kidneys, pancreas are unremarkable. Normal adrenal glands. Musculoskeletal: No aggressive osseous lesion. Review of the MIP images confirms the above findings. IMPRESSION: 1. Large occlusive pulmonary embolism within the proximal LEFT lower lobe pulmonary artery and lingular pulmonary artery. 2. Diffuse fine airspace disease within the LEFT lower lobe with differential including pulmonary infarction, pulmonary hemorrhage, or pneumonia (including COVID pneumonia). 3. No evidence of RIGHT ventricular strain. 4. Coronary artery calcification and aortic atherosclerotic calcification. Critical Value/emergent results were called by telephone at the time of  interpretation on 05/21/2020 at 6:55 pm to provider [facility] , who verbally acknowledged these results. Electronically Signed: By: Genevive Bi M.D. On: 05/21/2020 18:59   IR IVC FILTER PLMT / S&I Lenise Arena GUID/MOD SED  Result Date: 05/24/2020 INDICATION: 69 year old with recent cardiac arrest likely secondary to pulmonary embolism and COVID-19. Subsequent right ventricular failure. Patient has clot in the left pulmonary arteries and clot in left femoral vein. There is concern that the patient would not tolerate additional clot burden in the pulmonary arteries and request for IVC filter placement. EXAM: IVC FILTER PLACEMENT; IVC VENOGRAM; ULTRASOUND FOR VASCULAR ACCESS Physician: Rachelle Hora. Lowella Dandy, MD MEDICATIONS: None. ANESTHESIA/SEDATION: Versed 1 mg The patient was continuously monitored during the procedure by the interventional radiology nurse under my direct supervision. CONTRAST:  Carbon dioxide FLUOROSCOPY TIME:  Fluoroscopy Time: 3 minutes, 18 seconds, 18 mGy COMPLICATIONS: None immediate. PROCEDURE: Informed consent was obtained for an IVC filter placement from the patient's daughter. Ultrasound demonstrated a patent right internal jugular vein. Ultrasound images were obtained for documentation. The right neck was prepped and draped in a sterile fashion. Maximal barrier sterile technique was utilized including caps, mask, sterile gowns, sterile gloves, sterile drape, hand hygiene and skin antiseptic. The skin was anesthetized with 1% lidocaine. A 21 gauge needle was directed into the vein with ultrasound guidance and a micropuncture dilator set was placed. A wire was advanced into the IVC. The filter sheath was advanced over the wire into the IVC. An IVC venogram was performed with carbon dioxide. Bilateral renal veins were cannulated using a 5 French catheter and Bentson wire. Bilateral renal veins are identified. Fluoroscopic images were obtained for documentation. A Bard Denali filter was deployed  below the lowest renal vein. Post placement images of the filter were obtained. The vascular sheath was removed with manual compression. Bandage placed  over the puncture site. FINDINGS: IVC was patent. Bilateral renal veins were identified. The filter was deployed below the lowest renal vein. IMPRESSION: Successful placement of a retrievable IVC filter. PLAN: This IVC filter is potentially retrievable. The patient will be assessed for filter retrieval by Interventional Radiology in approximately 8-12 weeks. Further recommendations regarding filter retrieval, continued surveillance or declaration of device permanence, will be made at that time. Electronically Signed   By: Richarda OverlieAdam  Henn M.D.   On: 05/24/2020 17:57   DG Chest Port 1 View  Result Date: 06/10/2020 CLINICAL DATA:  Shortness of breath EXAM: PORTABLE CHEST 1 VIEW COMPARISON:  05/26/2020 FINDINGS: Numerous leads and wires project over the chest. Midline trachea. Normal heart size. Suspect layering tiny bilateral pleural effusions. No pneumothorax. Left greater than right base airspace disease is slightly improved, given differences in technique. Suspect underlying mild pulmonary venous congestion. Removal of left internal jugular line. IMPRESSION: Overall mildly improved aeration. Persistent layering bilateral pleural effusions and slightly improved airspace disease. Suspect developing pulmonary venous congestion. Electronically Signed   By: Jeronimo GreavesKyle  Talbot M.D.   On: 06/10/2020 15:58   DG CHEST PORT 1 VIEW  Result Date: 05/26/2020 CLINICAL DATA:  Acute respiratory failure.  Hypoxia. EXAM: PORTABLE CHEST 1 VIEW COMPARISON:  05/22/2020. FINDINGS: Interim extubation and removal of NG tube. Left IJ line in stable position. Heart size normal. Diffuse left lung infiltrate. Right base infiltrate. No prominent pleural effusion. No pneumothorax. IMPRESSION: 1. Interim extubation and removal of NG tube. Left IJ line stable position. 2. Diffuse left lung infiltrate  and right base infiltrate. Electronically Signed   By: Maisie Fushomas  Register   On: 05/26/2020 05:50   DG CHEST PORT 1 VIEW  Result Date: 05/22/2020 CLINICAL DATA:  Central line placement, intubation EXAM: PORTABLE CHEST 1 VIEW COMPARISON:  05/21/2020 FINDINGS: Left central line has been placed with the tip in the SVC. No pneumothorax. Endotracheal tube is 5 cm above the carina. Patchy airspace disease throughout the left lung. No focal opacity on the right. Heart is normal size. IMPRESSION: Endotracheal tube 5 cm above the carina. Left central line tip in the SVC. No pneumothorax. Stable patchy left lung airspace disease. Electronically Signed   By: Charlett NoseKevin  Dover M.D.   On: 05/22/2020 21:06   DG Chest Port 1 View  Result Date: 05/21/2020 CLINICAL DATA:  Cough, lower back pain, has not eaten 5 days, emesis EXAM: PORTABLE CHEST 1 VIEW COMPARISON:  Radiograph 04/19/2010, CT 04/18/2010 FINDINGS: Heterogeneous opacities present predominantly along the periphery of the left lung and minimally in the right lung base as well. No pneumothorax or effusion. The aorta is calcified. The remaining cardiomediastinal contours are unremarkable. No acute osseous or soft tissue abnormality. Degenerative changes are present in the imaged spine and shoulders. Telemetry leads overlie the chest. IMPRESSION: Heterogeneous opacities throughout the lungs, greatest in the left lung periphery concerning for pneumonia including potential viral etiology. Aortic Atherosclerosis (ICD10-I70.0). Electronically Signed   By: Kreg ShropshirePrice  DeHay M.D.   On: 05/21/2020 03:50   DG Abd Portable 1V  Result Date: 05/23/2020 CLINICAL DATA:  Encounter for orogastric tube placement. EXAM: PORTABLE ABDOMEN - 1 VIEW COMPARISON:  06/09/2009 and chest radiograph 05/22/2020 FINDINGS: Gastric tube extends into the abdomen and the tip is in the midline of the lower abdomen, likely in the distal stomach body region. Again noted are patchy densities at the left lung  base. Few gas-filled loops of bowel in the abdomen. IMPRESSION: Orogastric tube in the lower abdominal midline and  likely in the distal stomach body region. Electronically Signed   By: Richarda Overlie M.D.   On: 05/23/2020 12:17   ECHOCARDIOGRAM COMPLETE  Result Date: 05/23/2020    ECHOCARDIOGRAM REPORT   Patient Name:   ERICK MURIN Date of Exam: 05/23/2020 Medical Rec #:  [id number]      Height:       72.0 in Accession #:    [id number]     Weight:       142.4 lb Date of Birth:  [birth year]      BSA:          1.844 m Patient Age:    69 years       BP:           112/85 mmHg Patient Gender: M              HR:           96 bpm. Exam Location:  Inpatient Procedure: 2D Echo, Cardiac Doppler and Color Doppler STAT ECHO Indications:    I26.02 Pulmonary embolus  History:        Patient has no prior history of Echocardiogram examinations.                 Arrythmias:Cardiac Arrest; Signs/Symptoms:Dyspnea, Hypotension                 and Altered Mental Status. Covid 19 positive.  Sonographer:    Sheralyn Boatman RDCS Referring Phys: [id number] Martina Sinner  Sonographer Comments: Technically difficult study due to poor echo windows and echo performed with patient supine and on artificial respirator. IMPRESSIONS  1. Severe RV failure and cor pulmonale.  2. Abnormal septal motion and septal flattening consistent with RV pressure overload. Left ventricular ejection fraction, by estimation, is 50 to 55%. The left ventricle has low normal function. The left ventricle has no regional wall motion abnormalities. Left ventricular diastolic parameters were normal.  3. Right ventricular systolic function is severely reduced. The right ventricular size is severely enlarged. There is mildly elevated pulmonary artery systolic pressure.  4. The mitral valve is normal in structure. No evidence of mitral valve regurgitation. No evidence of mitral stenosis.  5. The aortic valve is normal in structure. Aortic valve regurgitation is not visualized. No  aortic stenosis is present.  6. The inferior vena cava is dilated in size with <50% respiratory variability, suggesting right atrial pressure of 15 mmHg. FINDINGS  Left Ventricle: Abnormal septal motion and septal flattening consistent with RV pressure overload. Left ventricular ejection fraction, by estimation, is 50 to 55%. The left ventricle has low normal function. The left ventricle has no regional wall motion abnormalities. The left ventricular internal cavity size was normal in size. There is no left ventricular hypertrophy. Left ventricular diastolic parameters were normal. Right Ventricle: The right ventricular size is severely enlarged. Right vetricular wall thickness was not assessed. Right ventricular systolic function is severely reduced. There is mildly elevated pulmonary artery systolic pressure. The tricuspid regurgitant velocity is 2.67 m/s, and with an assumed right atrial pressure of 8 mmHg, the estimated right ventricular systolic pressure is 36.5 mmHg. Left Atrium: Left atrial size was normal in size. Right Atrium: Right atrial size was normal in size. Pericardium: There is no evidence of pericardial effusion. Mitral Valve: The mitral valve is normal in structure. No evidence of mitral valve regurgitation. No evidence of mitral valve stenosis. Tricuspid Valve: The tricuspid valve is normal in structure. Tricuspid valve regurgitation is mild .  No evidence of tricuspid stenosis. Aortic Valve: The aortic valve is normal in structure. Aortic valve regurgitation is not visualized. No aortic stenosis is present. Pulmonic Valve: The pulmonic valve was normal in structure. Pulmonic valve regurgitation is not visualized. No evidence of pulmonic stenosis. Aorta: The aortic root is normal in size and structure. Venous: The inferior vena cava is dilated in size with less than 50% respiratory variability, suggesting right atrial pressure of 15 mmHg. IAS/Shunts: No atrial level shunt detected by color flow  Doppler. Additional Comments: Severe RV failure and cor pulmonale.  LEFT VENTRICLE PLAX 2D LVIDd:         3.70 cm     Diastology LVIDs:         2.80 cm     LV e' medial:    4.46 cm/s LV PW:         1.10 cm     LV E/e' medial:  4.9 LV IVS:        1.10 cm     LV e' lateral:   6.97 cm/s LVOT diam:     2.10 cm     LV E/e' lateral: 3.1 LV SV:         28 LV SV Index:   15 LVOT Area:     3.46 cm  LV Volumes (MOD) LV vol d, MOD A2C: 22.6 ml LV vol d, MOD A4C: 25.5 ml LV vol s, MOD A2C: 11.1 ml LV vol s, MOD A4C: 11.7 ml LV SV MOD A2C:     11.5 ml LV SV MOD A4C:     25.5 ml LV SV MOD BP:      13.7 ml RIGHT VENTRICLE            IVC RV S prime:     8.84 cm/s  IVC diam: 2.50 cm TAPSE (M-mode): 1.3 cm LEFT ATRIUM           Index      RIGHT ATRIUM           Index LA diam:      2.80 cm 1.52 cm/m RA Area:     14.30 cm LA Vol (A4C): 10.5 ml 5.69 ml/m RA Volume:   42.70 ml  23.15 ml/m  AORTIC VALVE LVOT Vmax:   53.80 cm/s LVOT Vmean:  41.900 cm/s LVOT VTI:    0.080 m  AORTA Ao Root diam: 3.60 cm Ao Asc diam:  3.50 cm MITRAL VALVE               TRICUSPID VALVE MV Area (PHT): 3.27 cm    TR Peak grad:   28.5 mmHg MV Decel Time: 232 msec    TR Vmax:        267.00 cm/s MV E velocity: 21.94 cm/s MV A velocity: 41.30 cm/s  SHUNTS MV E/A ratio:  0.53        Systemic VTI:  0.08 m                            Systemic Diam: 2.10 cm Charlton Haws MD Electronically signed by Charlton Haws MD Signature Date/Time: 05/23/2020/10:46:38 AM    Final    CT RENAL STONE STUDY  Result Date: 05/21/2020 CLINICAL DATA:  69 year old male with low back pain, flank pain, decreased p.o. EXAM: CT ABDOMEN AND PELVIS WITHOUT CONTRAST TECHNIQUE: Multidetector CT imaging of the abdomen and pelvis was performed following the standard protocol without IV contrast. COMPARISON:  Noncontrast  CT Abdomen and Pelvis 10/28/2008. Portable chest radiograph 04/19/2010. FINDINGS: Lower chest: Patchy, irregular peribronchial and peripheral scattered pulmonary opacity at  both lung bases. This seems to be new from last month. Underlying probable pulmonary hyperinflation. Some associated lung base bronchiectasis. No cavitating areas identified. No cardiomegaly, pericardial effusion or pleural effusion. Hepatobiliary: Negative noncontrast liver and gallbladder. Pancreas: Negative. Spleen: Negative. Adrenals/Urinary Tract: Bulky left nephrolithiasis measuring 13 mm at the renal pelvis. Additional left lower pole stone. No hydronephrosis. Negative noncontrast right kidney. No hydroureter. Unremarkable urinary bladder. Incidental pelvic phleboliths. Stomach/Bowel: Decompressed large bowel. There is long segment circumferential wall thickening in the right colon, up to the 12 mm series 4, image 49) with some areas of adjacent mesenteric stranding (coronal image 43). The wall thickening gradually decreases through the transverse colon. No free air. No free fluid. The terminal ileum appear spared. Appendix seems to remain normal on coronal image 32. No dilated small bowel.  Decompressed stomach and duodenum. Vascular/Lymphatic: Normal caliber abdominal aorta. Mild calcified atherosclerosis. Vascular patency is not evaluated in the absence of IV contrast. No lymphadenopathy identified in the absence of contrast. Reproductive: Negative. Other: Previous lower abdominal ventral hernia repair with mesh. No pelvic free fluid. Musculoskeletal: Degenerative changes in the spine. No acute osseous abnormality identified. IMPRESSION: 1. In the abdomen there is circumferential bowel wall thickening and mesenteric stranding involving the right colon. Wall thickening gradually abates through the transverse colon. This is compatible with Acute Nonspecific Colitis. 2. But the lung bases are also abnormal with patchy and irregular peribronchial and peripheral pulmonary opacity suspicious for acute viral/atypical respiratory infection. No areas of cavitation to suggest septic emboli. No pleural effusion. 3.  Bulky left nephrolithiasis, but no obstructive uropathy. 4. Previous lower abdominal hernia repair with mesh. Electronically Signed   By: Odessa Fleming M.D.   On: 05/21/2020 02:46   VAS [place] LOWER EXTREMITY VENOUS (DVT)  Result Date: 05/24/2020  Lower Venous DVT Study Indications: Covid-19, pulmonary embolism.  Risk Factors: Confirmed PE. Anticoagulation: Heparin. Limitations: Body habitus, ventilation, did not perform compressions in thigh and popliteal fossa of the left lower extremity secondary to mobile thrombus, bandages and line. Comparison Study: No prior study Performing Technologist: Sherren Kerns RVS  Examination Guidelines: A complete evaluation includes B-mode imaging, spectral Doppler, color Doppler, and power Doppler as needed of all accessible portions of each vessel. Bilateral testing is considered an integral part of a complete examination. Limited examinations for reoccurring indications may be performed as noted. The reflux portion of the exam is performed with the patient in reverse Trendelenburg.  +---------+---------------+---------+-----------+----------+--------------+ RIGHT    CompressibilityPhasicitySpontaneityPropertiesThrombus Aging +---------+---------------+---------+-----------+----------+--------------+ CFV      Full           Yes      No                                  +---------+---------------+---------+-----------+----------+--------------+ SFJ      Full                                                        +---------+---------------+---------+-----------+----------+--------------+ FV Prox  Full                                                        +---------+---------------+---------+-----------+----------+--------------+  FV Mid   Full                                                        +---------+---------------+---------+-----------+----------+--------------+ FV DistalFull                                                         +---------+---------------+---------+-----------+----------+--------------+ PFV      Full                                                        +---------+---------------+---------+-----------+----------+--------------+ POP      Full           Yes      No                                  +---------+---------------+---------+-----------+----------+--------------+ PTV      Full                                                        +---------+---------------+---------+-----------+----------+--------------+ PERO     Full                                                        +---------+---------------+---------+-----------+----------+--------------+   +---------+---------------+---------+-----------+----------+-------------------+ LEFT     CompressibilityPhasicitySpontaneityPropertiesThrombus Aging      +---------+---------------+---------+-----------+----------+-------------------+ CFV                                                   not visualized                                                            secondary to                                                              bandage/line        +---------+---------------+---------+-----------+----------+-------------------+ SFJ  not visualized                                                            secondary to                                                              bandage/line        +---------+---------------+---------+-----------+----------+-------------------+ FV Prox  Full                                         patent by color     +---------+---------------+---------+-----------+----------+-------------------+ FV Mid                                                patent by color     +---------+---------------+---------+-----------+----------+-------------------+ FV Distal                                              patent by color     +---------+---------------+---------+-----------+----------+-------------------+ PFV                                                   patent by color     +---------+---------------+---------+-----------+----------+-------------------+ POP                                                   patent by color     +---------+---------------+---------+-----------+----------+-------------------+ PTV      Full                                                             +---------+---------------+---------+-----------+----------+-------------------+ PERO     Full                                                             +---------+---------------+---------+-----------+----------+-------------------+     Summary: RIGHT: - There is no evidence of deep vein thrombosis in the lower extremity.  vessels are dilated with significantly sluggish flow, throughout  LEFT: - Findings consistent with acute deep vein thrombosis involving the left femoral vein. - Acute, mobile thrombus noted in the  proximal and mid femoral vein. Vessels are dilated with significantly sluggish flow, throughout.  *See table(s) above for measurements and observations. Electronically signed by Waverly Ferrari MD on 05/24/2020 at 6:21:22 PM.    Final

## 2020-06-11 NOTE — Care Management Important Message (Signed)
Important Message  Patient Details  Name: Dwayne Barnes MRN: [id number] Date of Birth: 03/29/1951   Medicare Important Message Given:  Yes     Bettye Sitton P Harika Laidlaw 06/11/2020, 2:25 PM

## 2020-06-11 NOTE — Progress Notes (Signed)
Nutrition Follow-up  DOCUMENTATION CODES:   Underweight  INTERVENTION:  ContinueEnsure Enlive poTID, each supplement provides 350 kcal and 20 grams of protein.  Encourage adequate PO intake.  NUTRITION DIAGNOSIS:   Increased nutrient needs related to acute illness (COVID-19) as evidenced by estimated needs; ongoing  GOAL:   Patient will meet greater than or equal to 90% of their needs; progressing  MONITOR:   PO intake,Supplement acceptance,Diet advancement,Skin,Weight trends,I & O's,Labs  REASON FOR ASSESSMENT:   Malnutrition Screening Tool    ASSESSMENT:   69 yo male with no known PMH presents with N/V, weakness and back and admitted with COVID 19 pneumonia on 11/18, had PEA arrest on 11/19 and required intubation.  11/18 Admitted 11/19 PEA arrest, Intubated 11/23 self-extubated; agitated requiring Haldol 11/24 Cortrak placed (gastric tip) 11/25 Diet advanced to Dysphagia 1 with Honey Thick Liquids 11/26 Tube feeds discontinued, Cortrak NGT out    Pt currently on a dysphagia 3 diet with thin liquids 1200 ml fluid restriction. Meal completion has been varied from 25-100%. Pt currently has Ensure ordered and has been consuming them. RD to continue with current orders to aid in caloric and protein needs. Diarrhea has improved. Plans for SNF upon discharge per MD.    Labs and medications reviewed.   Diet Order:   Diet Order            DIET DYS 3 Room service appropriate? No; Fluid consistency: Thin; Fluid restriction: 1200 mL Fluid  Diet effective now                 EDUCATION NEEDS:   No education needs have been identified at this time  Skin:  Skin Assessment: Reviewed RN Assessment Skin Integrity Issues:: Other (Comment) Other: puncture R throat  Last BM:  12/9  Height:   Ht Readings from Last 1 Encounters:  05/22/20 6' (1.829 m)    Weight:   Wt Readings from Last 1 Encounters:  06/11/20 60.3 kg    Ideal Body Weight:  80.9 kg  BMI:   Body mass index is 18.03 kg/m.  Estimated Nutritional Needs:   Kcal:  4132-4401 kcals  Protein:  95-130 g  Fluid:  1.2 L/day  Roslyn Smiling, MS, RD, LDN RD pager number/after hours weekend pager number on Amion.

## 2020-06-11 NOTE — Evaluation (Signed)
Clinical/Bedside Swallow Evaluation Patient Details  Name: Dwayne Barnes MRN: [id number] Date of Birth: Jul 04, 1951  Today's Date: 06/11/2020 Time: SLP Start Time (ACUTE ONLY): 3300 SLP Stop Time (ACUTE ONLY): 0945 SLP Time Calculation (min) (ACUTE ONLY): 17 min  Past Medical History:  Past Medical History:  Diagnosis Date  . COVID-19 05/21/2020  . PE (pulmonary thromboembolism) (HCC) 05/2020   Past Surgical History:  Past Surgical History:  Procedure Laterality Date  . IR IVC FILTER PLMT / S&I /IMG GUID/MOD SED  05/24/2020   HPI:  Pt is a 69 y.o. male who was admitted 11/18 with N/V, weakness and found to have pyuria, positive COVID test and LLL PE. Cardiac arrest 11/19 and transferred to ICU; 4 rounds of CPR were provided with ROSC.  Intubated 11/19-11/23 (self extubated). Pt seen by SLP 11/24-11/27 with ultimate d/c from services on dysphagia 3/thin. CXR 12/8: Overall mildly improved aeration. Persistent layering bilateral pleural effusions and slightly improved airspace disease. Suspect developing pulmonary venous congestion. Discharge was planned for 12/8  but then cancelled due to pt being febrile. SLP will reconsulted on that date.   Assessment / Plan / Recommendation Clinical Impression  Pt was seen for bedside swallow evaluation and he denied a history of dysphagia. Pt stated that he typically consumes soft solids and he denied any signs of aspiration since admission, but stated that he does have a baseline cough due to phlegm which does not coincide with meals. Instructions and questions were provided orthographically due to pt's hearing impairment. Oral mechanism exam was [facility] and he presented with full dentures. He tolerated all solids and liquids without signs or symptoms of oropharyngeal dysphagia. It is recommended that his current diet of dysphagia 3 solids and thin liquids be continued. Further skilled SLP services do not appear to be clinically indicated for swallowing at this  time, but please reconsult if pt's status changes or there is significant concern for silent aspiration. SLP Visit Diagnosis: Dysphagia, unspecified (R13.10)    Aspiration Risk  No limitations    Diet Recommendation Dysphagia 3 (Mech soft);Thin liquid   Liquid Administration via: Cup;Straw Medication Administration: Whole meds with puree Supervision: Patient able to self feed Compensations: Minimize environmental distractions Postural Changes: Seated upright at 90 degrees    Other  Recommendations Oral Care Recommendations: Oral care BID   Follow up Recommendations None      Frequency and Duration            Prognosis        Swallow Study   General Date of Onset: 05/26/20 HPI: Pt is a 69 y.o. male who was admitted 11/18 with N/V, weakness and found to have pyuria, positive COVID test and LLL PE. Cardiac arrest 11/19 and transferred to ICU; 4 rounds of CPR were provided with ROSC.  Intubated 11/19-11/23 (self extubated). Pt seen by SLP 11/24-11/27 with ultimate d/c from services on dysphagia 3/thin. CXR 12/8: Overall mildly improved aeration. Persistent layering bilateral pleural effusions and slightly improved airspace disease. Suspect developing pulmonary venous congestion. Discharge was planned for 12/8  but then cancelled due to pt being febrile. SLP will reconsulted on that date. Type of Study: Bedside Swallow Evaluation Previous Swallow Assessment: See HPI Diet Prior to this Study: Dysphagia 3 (soft);Thin liquids Temperature Spikes Noted: No Respiratory Status: Room air History of Recent Intubation: Yes Length of Intubations (days): 5 days Date extubated: 05/26/20 Behavior/Cognition: Alert;Cooperative;Pleasant mood Oral Cavity Assessment: Within Functional Limits Oral Care Completed by SLP: No Oral Cavity - Dentition:  Dentures, top;Dentures, bottom Vision: Functional for self-feeding Self-Feeding Abilities: Needs assist Patient Positioning: Upright in chair;Postural  control adequate for testing Baseline Vocal Quality: Normal Volitional Cough: Weak Volitional Swallow: Able to elicit    Oral/Motor/Sensory Function Overall Oral Motor/Sensory Function: Within functional limits   Ice Chips Ice chips: Within functional limits Presentation: Spoon   Thin Liquid Thin Liquid: Within functional limits Presentation: Straw    Nectar Thick Nectar Thick Liquid: Not tested   Honey Thick Honey Thick Liquid: Not tested   Puree Puree: Within functional limits Presentation: Spoon   Solid     Solid: Within functional limits Presentation: Self Fed     Gabrelle Roca I. Vear Clock, MS, CCC-SLP Acute Rehabilitation Services Office number [phone] Pager [phone]  Scheryl Marten 06/11/2020,10:20 AM

## 2020-06-11 NOTE — Progress Notes (Signed)
Physical Therapy Treatment Patient Details Name: Dwayne Barnes MRN: [id number] DOB: 1950-12-16 Today's Date: 06/11/2020    History of Present Illness Pt is a 69 y.o. male admitted 05/20/20 with back pain, weakness and colitis; found to have (+) COVID-19 and LLL PE. Pt with 2x cardiac arrests on 11/19 and intubated; self-exubated 11/22. 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 Head CT 11/23 unremarkable. S/p femoral line removal 11/24. Course complicated by progressive diarrhea; abdominal CT 11/29 with progressive colitis. PMH includes HOH.    PT Comments    Patient continuing to progress and showed good awareness that he was not safe to ambulate without RW. (Last session he refused RW; today agreed). By third walk, offered to attempt without RW and when pt stood he clearly felt unsteady and returned to sitting. Increased time required due to pt's extreme [facility] and does best with communication written out.     Follow Up Recommendations  SNF;Supervision for mobility/OOB     Equipment Recommendations  3in1 (PT);Rolling walker with 5" wheels    Recommendations for Other Services       Precautions / Restrictions Precautions Precautions: Fall Precaution Comments: Extremely HOH (did well with reading large font) Restrictions Weight Bearing Restrictions: No    Mobility  Bed Mobility                  Transfers Overall transfer level: Needs assistance Equipment used: None;Rolling walker (2 wheeled) Transfers: Sit to/from [employer] Transfers Sit to Stand: Min guard         General transfer comment: no lifting assist needed; cues for safe use of RW; 4 reps  Ambulation/Gait Ambulation/Gait assistance: Min guard Gait Distance (Feet): 50 Feet (x3 seated rests between) Assistive device: Rolling walker (2 wheeled) Gait Pattern/deviations: Step-through pattern;Decreased stride length;Wide base of support Gait velocity: decr   General Gait Details: pt agreed  to use RW; incr dyspnea however pt reports he does not feel short of breath. Sat probe moved to earlobe and pt remained 97-100% on room air   Stairs             Wheelchair Mobility    Modified Rankin (Stroke Patients Only)       Balance Overall balance assessment: Needs assistance   Sitting balance-Leahy Scale: Fair       Standing balance-Leahy Scale: Poor Standing balance comment: Reliant on external assist and at least single UE support to maintain balance; after standing without RW he did not feel safe to try walking and returned to sitting                            Cognition Arousal/Alertness: Awake/alert Behavior During Therapy: WFL for tasks assessed/performed Overall Cognitive Status: Within Functional Limits for tasks assessed                                        Exercises General Exercises - Lower Extremity Ankle Circles/Pumps: AROM;Both Long Arc Quad: AROM;Both Hip Flexion/Marching: AROM;5 reps Heel Raises: AROM;Seated;Both    General Comments        Pertinent Vitals/Pain Pain Assessment: No/denies pain Faces Pain Scale: No hurt    Home Living Family/patient expects to be discharged to:: Private residence Living Arrangements: Alone Available Help at Discharge: Family           Additional Comments: Unsure of  home set up, pt not able to report this session- very HOH.  Did best with writing to communicate on dry erase board    Prior Function        Comments: Unsure of PLOF due to pt being poor historian and difficulty hearing   PT Goals (current goals can now be found in the care plan section) Acute Rehab PT Goals Patient Stated Goal: not stated Time For Goal Achievement: 06/10/20 Potential to Achieve Goals: Good Progress towards PT goals: Progressing toward goals    Frequency    Min 2X/week      PT Plan Current plan remains appropriate    Co-evaluation              AM-PAC PT "6 Clicks"  Mobility   Outcome Measure  Help needed turning from your back to your side while in a flat bed without using bedrails?: A Little Help needed moving from lying on your back to sitting on the side of a flat bed without using bedrails?: A Little Help needed moving to and from a bed to a chair (including a wheelchair)?: A Little Help needed standing up from a chair using your arms (e.g., wheelchair or bedside chair)?: A Little Help needed to walk in hospital room?: A Little Help needed climbing 3-5 steps with a railing? : A Lot 6 Click Score: 17    End of Session   Activity Tolerance: Patient tolerated treatment well Patient left: in chair;with call bell/phone within reach Nurse Communication: Mobility status PT Visit Diagnosis: Muscle weakness (generalized) (M62.81);Difficulty in walking, not elsewhere classified (R26.2)     Time: 7673-4193 PT Time Calculation (min) (ACUTE ONLY): 52 min  Charges:  $Gait Training: 23-37 mins $Therapeutic Exercise: 8-22 mins                      Jerolyn Center, PT Pager [phone]    Dwayne Barnes 06/11/2020, 11:59 AM

## 2020-06-12 LAB — CBC WITH DIFFERENTIAL/PLATELET
Abs Immature Granulocytes: 0.16 10*3/uL — ABNORMAL HIGH (ref 0.00–0.07)
Basophils Absolute: 0.1 10*3/uL (ref 0.0–0.1)
Basophils Relative: 1 %
Eosinophils Absolute: 0.2 10*3/uL (ref 0.0–0.5)
Eosinophils Relative: 3 %
HCT: 25.9 % — ABNORMAL LOW (ref 39.0–52.0)
Hemoglobin: 8.3 g/dL — ABNORMAL LOW (ref 13.0–17.0)
Immature Granulocytes: 2 %
Lymphocytes Relative: 11 %
Lymphs Abs: 1 10*3/uL (ref 0.7–4.0)
MCH: 27.8 pg (ref 26.0–34.0)
MCHC: 32 g/dL (ref 30.0–36.0)
MCV: 86.6 fL (ref 80.0–100.0)
Monocytes Absolute: 0.6 10*3/uL (ref 0.1–1.0)
Monocytes Relative: 6 %
Neutro Abs: 6.9 10*3/uL (ref 1.7–7.7)
Neutrophils Relative %: 77 %
Platelets: 551 10*3/uL — ABNORMAL HIGH (ref 150–400)
RBC: 2.99 MIL/uL — ABNORMAL LOW (ref 4.22–5.81)
RDW: 14.6 % (ref 11.5–15.5)
WBC: 8.9 10*3/uL (ref 4.0–10.5)
nRBC: 0 % (ref 0.0–0.2)

## 2020-06-12 LAB — COMPREHENSIVE METABOLIC PANEL
ALT: 56 U/L — ABNORMAL HIGH (ref 0–44)
AST: 38 U/L (ref 15–41)
Albumin: 1.4 g/dL — ABNORMAL LOW (ref 3.5–5.0)
Alkaline Phosphatase: 224 U/L — ABNORMAL HIGH (ref 38–126)
Anion gap: 9 (ref 5–15)
BUN: 8 mg/dL (ref 8–23)
CO2: 23 mmol/L (ref 22–32)
Calcium: 8.1 mg/dL — ABNORMAL LOW (ref 8.9–10.3)
Chloride: 98 mmol/L (ref 98–111)
Creatinine, Ser: 0.87 mg/dL (ref 0.61–1.24)
GFR, Estimated: >60 mL/min (ref >60)
Glucose, Bld: 97 mg/dL (ref 70–99)
Potassium: 4 mmol/L (ref 3.5–5.1)
Sodium: 130 mmol/L — ABNORMAL LOW (ref 135–145)
Total Bilirubin: 0.8 mg/dL (ref 0.3–1.2)
Total Protein: 5 g/dL — ABNORMAL LOW (ref 6.5–8.1)

## 2020-06-12 LAB — D-DIMER, QUANTITATIVE: D-Dimer, Quant: 3.76 ug/mL-FEU — ABNORMAL HIGH (ref 0.00–0.50)

## 2020-06-12 LAB — GLUCOSE, CAPILLARY
Glucose-Capillary: 108 mg/dL — ABNORMAL HIGH (ref 70–99)
Glucose-Capillary: 92 mg/dL (ref 70–99)
Glucose-Capillary: 97 mg/dL (ref 70–99)
Glucose-Capillary: 105 mg/dL — ABNORMAL HIGH (ref 70–99)
Glucose-Capillary: 136 mg/dL — ABNORMAL HIGH (ref 70–99)
Glucose-Capillary: 137 mg/dL — ABNORMAL HIGH (ref 70–99)
Glucose-Capillary: 108 mg/dL — ABNORMAL HIGH (ref 70–99)

## 2020-06-12 LAB — MAGNESIUM: Magnesium: 2 mg/dL (ref 1.7–2.4)

## 2020-06-12 LAB — PROCALCITONIN: Procalcitonin: 0.12 ng/mL

## 2020-06-12 LAB — C-REACTIVE PROTEIN: CRP: 19.1 mg/dL — ABNORMAL HIGH (ref <1.0)

## 2020-06-12 LAB — BRAIN NATRIURETIC PEPTIDE: B Natriuretic Peptide: 74.4 pg/mL (ref 0.0–100.0)

## 2020-06-12 NOTE — Progress Notes (Signed)
PROGRESS NOTE                                                                                                                                                                                                             Patient Demographics:    Dwayne Barnes, is a 69 y.o. male, DOB - [birth year], WUJ:[id number]  Outpatient Primary MD for the patient is [name], PA-C   Admit date - 05/20/2020   LOS - 22  Chief Complaint  Patient presents with  . Fatigue       Brief Narrative: Patient is a 69 y.o. male with no significant past medical history-presented to the ED on 11/17 with nausea/vomiting, weakness and back pain-further evaluation revealed COVID-19 infection and a large PE.  Patient was started on IV heparin infusion along with steroid/Remdesivir-on 11/19-patient had a cardiac arrest-was intubated during the code-total downtime of around 6 minutes before ROSC.  Patient was subsequently transferred to the ICU-unfortunately soon after arrival in the ICU-he had another cardiac arrest-and 4 rounds of CPR were provided with ROSC.  Patient was provided supportive care-he subsequently improved-self extubated on 11/23-he was [facility] on 11/25.  Further hospital course complicated by diarrhea secondary to C. difficile along with AKI and hyponatremia.See below for further details.   COVID-19 vaccinated status: Vaccinated  Significant Events: 11/17>> Admit to [facility] for PE/COVID-19 infection 11/19>> PEA cardiac arrest x 2-given TPA-intubated-transfer to ICU 11/23>> Self extubated 11/25>> transfer to [facility] 11/29>> worsening diarrhea-CT abdomen with progressive colitis  Significant studies: 11/18>>CT renal stone: non specific colitis, GGO's, left nephrolithiasis without obstructive uropathy. 11/18>>CTA chest: large PC in LLL, GGO's LLL. 11/20 echo>> EF 50-55%, RV systolic function severely  reduced. 11/21>> B/L extremity Doppler: acute DVTin left femoral vein, acute mobile thrombus in the proximal femoral vein. 11/23>>CT Head: unremarkable   11/29>> CT abdomen/pelvis>> progressive colitis  COVID-19 medications: Steroids: 11/17>>11/26 Remdesivir: 11/17>> 11/22  Antibiotics: Oral vancomycin: 11/30>>  Microbiology data: 11/18>> urine culture: Insignificant growth 11/30>> stool C. difficile: +ve 11/30>> GI pathogen panel: negative  Procedures: ETT>>11/19 - 11/23 IVC filter placement by IR>>11/21.  Consults: PCCM, IR  DVT prophylaxis: Therapeutic Lovenox>> Eliquis    Subjective:   Patient in bed, appears comfortable, denies any headache, no fever, no chest pain or pressure, no shortness of breath , no abdominal pain. No  focal weakness.   Assessment  & Plan :   Cardiac arrest due to large PE: S/p TPA-was on therapeutic Lovenox-we will transition to Eliquis today. PE likely provoked by COVID-19 infection.  PCCM MD discussed with Dr. Illa Level indication for any acute intervention.  Continue telemetry monitoring.  Echo as above.  Large PE with DVT: Continue therapeutic anticoagulation-he is s/p TPA on 11/19 when he had cardiac arrest.  Patient is s/p IVC filter given mobile thrombus seen on lower extremity Doppler.  Once oral intake was stabilized-he was transitioned to Eliquis.  Acute hypoxic respiratory failure: Due to PE/cardiac arrest/COVID-19 pneumonia-self extubated on 11/23-currently stable on room air.  Acute metabolic encephalopathy/ICU delirium/possible anoxic injury: Mental status continues to slowly improve-getting more fluent with responses.  CT head without acute changes.  Continue supportive care.  Minimize all sedating medications.    Dysphagia: In the setting of cardiac arrest-seen by SLP-required NG tube-subsequently seen by SLP-diet gradually upgraded to a dysphagia 3 diet.  C. difficile colitis: Diarrhea improving-leukocytosis/AKI all getting  better.  Continue oral vancomycin.  Fever evening of 06/10/2020.  Clinically appears to have aspirated, MRSA nasal PCR positive, UA also positive, follow blood and urine cultures, trend procalcitonin, given 1 dose of IV vancomycin and 1 dose of oral Keflex on 06/10/2020.  Due to his underlying severe C. difficile trying to avoid antibiotics unless absolutely needed.  Speech is following, cultures negative thus far, holding off further antibiotics and clinically monitoring, if afebrile then likely discharge to SNF on 06/13/2020.  AKI: Likely hemodynamically mediated-due to diarrhea-has resolved with IV fluids and improvement in diarrhea.  COVID-19 pneumonia: Completed a course of Remdesivir-an steroids.  Hypoxia has resolved.   Paroxysmal atrial fibrillation: appears to be back in sinus rhythm-on anticoagulation for PE.  Echo as above.    Normocytic anemia: Secondary to critical illness-stable for monitoring-no indication for PRBC transfusion.  Transaminitis: Likely secondary to COVID-19-downtrending.  Deconditioning/debility: Secondary to critical illness- PT/OT eval completed-recommendations are for SNF, SNF does not want him at the SNF till his stools are formed and C. difficile has clinically resolved.  Monitoring closely.  Hard of hearing - supportive care.  Hyponatremia.  Persistent.  Became hypotensive 06/08/2020 hence IV fluids, other electrolytes including low serum uric acid point more towards SIADH but due to hypotension requires fluids.  Will monitor.  Also drinks more than 5-6 bottles of soda every day, requested to cut down.   Recent Labs  Lab 06/07/20 0500 06/08/20 0514 06/09/20 0054 06/10/20 1548 06/11/20 0116 06/12/20 0127  WBC 8.1 9.4 9.4 10.5 10.5 8.9  HGB 9.6* 9.2* 8.1* 8.5* 8.7* 8.3*  HCT 29.6* 28.5* 25.4* 25.6* 26.1* 25.9*  PLT 490* 510* 452* 582* 534* 551*  CRP  --   --   --  22.6* 21.1* 19.1*  BNP  --   --   --   --  204.3* 74.4  DDIMER  --   --   --   --  2.83*  3.76*  PROCALCITON  --   --   --  0.15 0.13 0.12  AST 74* 79* 46*  --  33 38  ALT 103* 105* 78*  --  61* 56*  ALKPHOS 201* 203* 182*  --  197* 224*  BILITOT 1.0 1.0 0.6  --  0.8 0.8  ALBUMIN 1.5* 1.5* 1.4*  --  1.3* 1.4*  LATICACIDVEN  --   --   --   --  1.0  --  GI prophylaxis: H2 blocker.  Condition -  Guarded  Family Communication  :  Daughter Renne Platts [phone])  updated over the phone 06/05/20 by previous MD.  I called on 06/06/2020, 06/09/20, 06/10/20, 06/11/20   Code Status :  Full Code  Diet :   Diet Order            DIET DYS 3 Room service appropriate? No; Fluid consistency: Thin; Fluid restriction: 1200 mL Fluid  Diet effective now                  Disposition Plan  :  Status is: Inpatient  Remains inpatient appropriate because:Inpatient level of care appropriate due to severity of illness   Dispo: The patient is from: Home              Anticipated d/c is to: SNF              Anticipated d/c date is: > 1 day              Patient currently is not medically stable to d/c.   Barriers to discharge: Persistent hyponatremia  Antimicorbials  :    Anti-infectives (From admission, onward)   Start     Dose/Rate Route Frequency Ordered Stop   06/10/20 2230  vancomycin (VANCOREADY) IVPB 1250 mg/250 mL        1,250 mg 166.7 mL/hr over 90 Minutes Intravenous  Once 06/10/20 2139 06/11/20 0140   06/10/20 2230  cephALEXin (KEFLEX) capsule 500 mg        500 mg Oral  Once 06/10/20 2139 06/10/20 2155   06/10/20 0000  vancomycin (VANCOCIN) 50 mg/mL oral solution        125 mg Oral 4 times daily 06/10/20 1053 06/15/20 2359   06/02/20 1000  vancomycin (VANCOCIN) 50 mg/mL oral solution 125 mg        125 mg Oral 4 times daily 06/02/20 0800 06/11/20 2158   05/22/20 1000  remdesivir 100 mg in sodium chloride 0.9 % 100 mL IVPB       "Followed by" Linked Group Details   100 mg 200 mL/hr over 30 Minutes Intravenous Daily 05/21/20 0857 05/25/20 1154   05/21/20 1030   remdesivir 200 mg in sodium chloride 0.9% 250 mL IVPB  Status:  Discontinued       "Followed by" Linked Group Details   200 mg 580 mL/hr over 30 Minutes Intravenous Once 05/21/20 0857 05/27/20 0734   05/21/20 0330  metroNIDAZOLE (FLAGYL) tablet 500 mg        500 mg Oral  Once 05/21/20 0320 05/21/20 0411   05/20/20 2330  cefTRIAXone (ROCEPHIN) 2 g in sodium chloride 0.9 % 100 mL IVPB        2 g 200 mL/hr over 30 Minutes Intravenous  Once 05/20/20 2324 05/21/20 0331      Inpatient Medications  Scheduled Meds: . apixaban  5 mg Oral BID  . Chlorhexidine Gluconate Cloth  6 each Topical Q0600  . famotidine  20 mg Oral Daily  . feeding supplement  237 mL Oral TID BM  . insulin aspart  0-9 Units Subcutaneous TID AC & HS  . mupirocin ointment  1 application Nasal BID  . sodium chloride flush  10-40 mL Intracatheter Q12H   Continuous Infusions:  PRN Meds:.acetaminophen (TYLENOL) oral liquid 160 mg/5 mL, [DISCONTINUED] ondansetron **OR** ondansetron (ZOFRAN) IV   Time Spent in minutes  25   See all Orders from today for further details  Susa Raring M.D on 06/12/2020 at 9:25 AM  To page go to [email or web address] - use universal password  [facility] -  Office  [phone]    Objective:   Vitals:   06/11/20 2059 06/11/20 2200 06/12/20 0532 06/12/20 0627  BP: 101/64  (!) 116/58   Pulse: 79  95   Resp: 20  20   Temp: 98.2 F (36.8 C)  97.6 F (36.4 C)   TempSrc: Oral  Oral   SpO2:  96% 98%   Weight:    59.8 kg  Height:        Wt Readings from Last 3 Encounters:  06/12/20 59.8 kg  12/20/14 66 kg     Intake/Output Summary (Last 24 hours) at 06/12/2020 0925 Last data filed at 06/12/2020 0910 Gross per 24 hour  Intake 1830 ml  Output 1900 ml  Net -70 ml     Physical Exam  Awake Alert, No new F.N deficits, Normal affect, extremely hard of hearing [place].AT,PERRAL Supple Neck,No JVD, No cervical lymphadenopathy appriciated.  Symmetrical Chest wall movement,  Good air movement bilaterally, CTAB RRR,No Gallops, Rubs or new Murmurs, No Parasternal Heave +ve B.Sounds, Abd Soft, No tenderness, No organomegaly appriciated, No rebound - guarding or rigidity. No Cyanosis, Clubbing or edema, No new Rash or bruise    Data Review:    CBC Recent Labs  Lab 06/07/20 0500 06/08/20 0514 06/09/20 0054 06/10/20 1548 06/11/20 0116 06/12/20 0127  WBC 8.1 9.4 9.4 10.5 10.5 8.9  HGB 9.6* 9.2* 8.1* 8.5* 8.7* 8.3*  HCT 29.6* 28.5* 25.4* 25.6* 26.1* 25.9*  PLT 490* 510* 452* 582* 534* 551*  MCV 87.3 87.4 88.2 86.2 85.3 86.6  MCH 28.3 28.2 28.1 28.6 28.4 27.8  MCHC 32.4 32.3 31.9 33.2 33.3 32.0  RDW 14.4 14.2 14.4 14.6 14.5 14.6  LYMPHSABS 0.8 1.2 1.2  --  0.9 1.0  MONOABS 0.7 0.7 0.7  --  0.6 0.6  EOSABS 0.1 0.1 0.2  --  0.2 0.2  BASOSABS 0.0 0.0 0.0  --  0.1 0.1    Chemistries  Recent Labs  Lab 06/07/20 0500 06/08/20 0514 06/09/20 0054 06/10/20 1548 06/11/20 0116 06/12/20 0127  NA 128* 127* 130* 128* 130* 130*  K 4.5 4.5 3.7 4.3 4.2 4.0  CL 97* 95* 97* 95* 98 98  CO2 23 23 24 24 24 23   GLUCOSE 104* 108* 142* 112* 114* 97  BUN 8 11 10 14 10 8   CREATININE 0.89 1.08 1.09 1.05 1.01 0.87  CALCIUM 7.9* 8.0* 7.7* 7.9* 7.9* 8.1*  MG 1.8 1.9 1.9 1.9 1.9 2.0  AST 74* 79* 46*  --  33 38  ALT 103* 105* 78*  --  61* 56*  ALKPHOS 201* 203* 182*  --  197* 224*  BILITOT 1.0 1.0 0.6  --  0.8 0.8   ------------------------------------------------------------------------------------------------------------------ No results for input(s): CHOL, HDL, LDLCALC, TRIG, CHOLHDL, LDLDIRECT in the last 72 hours.  Lab Results  Component Value Date   HGBA1C 5.5 05/22/2020   ------------------------------------------------------------------------------------------------------------------ No results for input(s): TSH, T4TOTAL, T3FREE, THYROIDAB in the last 72 hours.  Invalid input(s):  FREET3 ------------------------------------------------------------------------------------------------------------------ No results for input(s): VITAMINB12, FOLATE, FERRITIN, TIBC, IRON, RETICCTPCT in the last 72 hours.  Coagulation profile No results for input(s): INR, PROTIME in the last 168 hours.  Recent Labs    06/11/20 0116 06/12/20 0127  DDIMER 2.83* 3.76*    Cardiac Enzymes No results for input(s): CKMB, TROPONINI, MYOGLOBIN in the last 168 hours.  Invalid input(s): CK ------------------------------------------------------------------------------------------------------------------  Component Value Date/Time   BNP 74.4 06/12/2020 0127    Micro Results Recent Results (from the past 240 hour(s))  Gastrointestinal Panel by PCR , Stool     Status: None   Collection Time: 06/02/20  9:41 AM   Specimen: STOOL  Result Value Ref Range Status   Campylobacter species NOT DETECTED NOT DETECTED Final   Plesimonas shigelloides NOT DETECTED NOT DETECTED Final   Salmonella species NOT DETECTED NOT DETECTED Final   Yersinia enterocolitica NOT DETECTED NOT DETECTED Final   Vibrio species NOT DETECTED NOT DETECTED Final   Vibrio cholerae NOT DETECTED NOT DETECTED Final   Enteroaggregative E coli (EAEC) NOT DETECTED NOT DETECTED Final   Enteropathogenic E coli (EPEC) NOT DETECTED NOT DETECTED Final   Enterotoxigenic E coli (ETEC) NOT DETECTED NOT DETECTED Final   Shiga like toxin producing E coli (STEC) NOT DETECTED NOT DETECTED Final   Shigella/Enteroinvasive E coli (EIEC) NOT DETECTED NOT DETECTED Final   Cryptosporidium NOT DETECTED NOT DETECTED Final   Cyclospora cayetanensis NOT DETECTED NOT DETECTED Final   Entamoeba histolytica NOT DETECTED NOT DETECTED Final   Giardia lamblia NOT DETECTED NOT DETECTED Final   Adenovirus F40/41 NOT DETECTED NOT DETECTED Final   Astrovirus NOT DETECTED NOT DETECTED Final   Norovirus GI/GII NOT DETECTED NOT DETECTED Final   Rotavirus A  NOT DETECTED NOT DETECTED Final   Sapovirus (I, II, IV, and V) NOT DETECTED NOT DETECTED Final    Comment: Performed at [facility], [address] [place], [place] [address]  Culture, blood (routine x 2)     Status: None (Preliminary result)   Collection Time: 06/10/20  3:35 PM   Specimen: BLOOD  Result Value Ref Range Status   Specimen Description BLOOD LEFT ANTECUBITAL  Final   Special Requests   Final    BOTTLES DRAWN AEROBIC ONLY Blood Culture adequate volume   Culture   Final    NO GROWTH 2 DAYS Performed at [facility], [address], [place], [place] [address]    Report Status PENDING  Incomplete  Culture, blood (routine x 2)     Status: None (Preliminary result)   Collection Time: 06/10/20  3:46 PM   Specimen: BLOOD LEFT ARM  Result Value Ref Range Status   Specimen Description BLOOD LEFT ARM  Final   Special Requests   Final    BOTTLES DRAWN AEROBIC ONLY Blood Culture adequate volume   Culture   Final    NO GROWTH 2 DAYS Performed at [facility], [address], [place], [place] [address]    Report Status PENDING  Incomplete  MRSA PCR Screening     Status: Abnormal   Collection Time: 06/10/20  4:27 PM   Specimen: Nasal Mucosa; Nasopharyngeal  Result Value Ref Range Status   MRSA by PCR POSITIVE (A) NEGATIVE Final    Comment:        The GeneXpert MRSA Assay (FDA approved for NASAL specimens only), is one component of a comprehensive MRSA colonization surveillance program. It is not intended to diagnose MRSA infection nor to guide or monitor treatment for MRSA infections. RESULT CALLED TO, READ BACK BY AND VERIFIED WITH: Kerrin Champagne RN 06/10/20 at 1943 sk  Performed at [facility], [address] [place], [place] [address]     Radiology Reports CT ABDOMEN PELVIS WO CONTRAST  Result Date: 06/01/2020 CLINICAL DATA:  Left upper quadrant diarrhea, history of COVID-19 pneumonia. EXAM: CT ABDOMEN AND PELVIS  WITHOUT CONTRAST  TECHNIQUE: Multidetector CT imaging of the abdomen and pelvis was performed following the standard protocol without IV contrast. COMPARISON:  05/21/2020 FINDINGS: Lower chest: Bibasilar consolidation is noted increased when compared with the prior exam. These changes are worse on the left than the right. Stable changes of prior COVID-19 pneumonia are noted. No sizable effusion is seen. Hepatobiliary: No focal liver abnormality is seen. No gallstones, gallbladder wall thickening, or biliary dilatation. Pancreas: Unremarkable. No pancreatic ductal dilatation or surrounding inflammatory changes. Spleen: Normal in size without focal abnormality. Adrenals/Urinary Tract: Adrenal glands are within normal limits. Kidneys demonstrate left renal pelvis stone stable in appearance. No obstructive changes are seen. The bladder is well distended. Stomach/Bowel: Diffuse edematous changes of the colonic wall are seen throughout its course increased when compared with the prior exam consistent with a generalized colitis. Some pericolonic inflammatory changes noted particularly on the right increased from the prior exam. The appendix appears within normal limits. Small bowel and stomach are unremarkable. Vascular/Lymphatic: Atherosclerotic calcifications of the aorta are noted. IVC filter is noted in place. No sizable lymphadenopathy is noted. Reproductive: Prostate is unremarkable. Other: No abdominal wall hernia or abnormality. No abdominopelvic ascites. Changes of prior hernia repair are noted in the lower abdomen bilaterally Musculoskeletal: Degenerative changes of lumbar spine are noted. IMPRESSION: Increasing colonic wall thickness with pericolonic inflammatory change consistent with progressive colitis. Increasing bilateral lower lobe consolidation without sizable effusion. Some scattered changes consistent with the COVID-19 pneumonia are noted. Stable nonobstructing left renal stone. Electronically Signed   By: Alcide Clever  M.D.   On: 06/01/2020 23:59   CT HEAD WO CONTRAST  Result Date: 05/26/2020 CLINICAL DATA:  69 year old male with altered mental status. EXAM: CT HEAD WITHOUT CONTRAST TECHNIQUE: Contiguous axial images were obtained from the base of the skull through the vertex without intravenous contrast. COMPARISON:  Head CT dated 04/18/2010. FINDINGS: Brain: The ventricles and sulci appropriate size for patient's age. The gray-white matter discrimination is preserved. There is no acute intracranial hemorrhage. No mass effect midline shift. No extra-axial fluid collection. Vascular: No hyperdense vessel or unexpected calcification. Skull: Normal. Negative for fracture or focal lesion. Sinuses/Orbits: No acute finding. Other: None IMPRESSION: Unremarkable noncontrast CT of the brain. Electronically Signed   By: Elgie Collard M.D.   On: 05/26/2020 15:45   CT ANGIO CHEST PE W OR WO CONTRAST  Addendum Date: 05/21/2020   ADDENDUM REPORT: 05/21/2020 19:28 ADDENDUM: Critical Value/emergent results were called by telephone at the time of interpretation on 05/21/2020 at 7:27 pm to provider Dr. Dierdre Highman, who verbally acknowledged these results. Of note the airspace disease in the LEFT lower lobe is progressed from CT same day. Differential remains the same. Electronically Signed   By: Genevive Bi M.D.   On: 05/21/2020 19:28   Result Date: 05/21/2020 CLINICAL DATA:  PE suspected, high probability.  COVID-19 infection EXAM: CT ANGIOGRAPHY CHEST WITH CONTRAST TECHNIQUE: Multidetector CT imaging of the chest was performed using the standard protocol during bolus administration of intravenous contrast. Multiplanar CT image reconstructions and MIPs were obtained to evaluate the vascular anatomy. CONTRAST:  75mL OMNIPAQUE IOHEXOL 300 MG/ML  SOLN COMPARISON:  None. FINDINGS: Cardiovascular: Large filling defect within the proximal LEFT lower lobe pulmonary artery. Filling defect spans the bifurcation of the LEFT lower lobe and  lingular branch pulmonary branches. This this filling defect/thrombus is occlusive to the LEFT lower lobe. No LEFT upper lobe filling defects identified. No filling defect identified within the RIGHT lung pulmonary arteries.  No evidence of RIGHT ventricular strain with the RV to LV ratio less than 1. Coronary artery calcification and aortic atherosclerotic calcification. Mediastinum/Nodes: No axillary or supraclavicular adenopathy. No mediastinal or hilar adenopathy. No pericardial fluid. Esophagus normal. Lungs/Pleura: There is diffuse very fine airspace disease within the LEFT lower lobe involving the majority of the LEFT lower lobe. Similar milder findings in the posterior RIGHT lower lobe and lingula. Upper Abdomen: Limited view of the liver, kidneys, pancreas are unremarkable. Normal adrenal glands. Musculoskeletal: No aggressive osseous lesion. Review of the MIP images confirms the above findings. IMPRESSION: 1. Large occlusive pulmonary embolism within the proximal LEFT lower lobe pulmonary artery and lingular pulmonary artery. 2. Diffuse fine airspace disease within the LEFT lower lobe with differential including pulmonary infarction, pulmonary hemorrhage, or pneumonia (including COVID pneumonia). 3. No evidence of RIGHT ventricular strain. 4. Coronary artery calcification and aortic atherosclerotic calcification. Critical Value/emergent results were called by telephone at the time of interpretation on 05/21/2020 at 6:55 pm to provider [facility] , who verbally acknowledged these results. Electronically Signed: By: Genevive BiStewart  Edmunds M.D. On: 05/21/2020 18:59   IR IVC FILTER PLMT / S&I Lenise Arena/IMG GUID/MOD SED  Result Date: 05/24/2020 INDICATION: 69 year old with recent cardiac arrest likely secondary to pulmonary embolism and COVID-19. Subsequent right ventricular failure. Patient has clot in the left pulmonary arteries and clot in left femoral vein. There is concern that the patient would not tolerate  additional clot burden in the pulmonary arteries and request for IVC filter placement. EXAM: IVC FILTER PLACEMENT; IVC VENOGRAM; ULTRASOUND FOR VASCULAR ACCESS Physician: Rachelle HoraAdam R. Lowella DandyHenn, MD MEDICATIONS: None. ANESTHESIA/SEDATION: Versed 1 mg The patient was continuously monitored during the procedure by the interventional radiology nurse under my direct supervision. CONTRAST:  Carbon dioxide FLUOROSCOPY TIME:  Fluoroscopy Time: 3 minutes, 18 seconds, 18 mGy COMPLICATIONS: None immediate. PROCEDURE: Informed consent was obtained for an IVC filter placement from the patient's daughter. Ultrasound demonstrated a patent right internal jugular vein. Ultrasound images were obtained for documentation. The right neck was prepped and draped in a sterile fashion. Maximal barrier sterile technique was utilized including caps, mask, sterile gowns, sterile gloves, sterile drape, hand hygiene and skin antiseptic. The skin was anesthetized with 1% lidocaine. A 21 gauge needle was directed into the vein with ultrasound guidance and a micropuncture dilator set was placed. A wire was advanced into the IVC. The filter sheath was advanced over the wire into the IVC. An IVC venogram was performed with carbon dioxide. Bilateral renal veins were cannulated using a 5 French catheter and Bentson wire. Bilateral renal veins are identified. Fluoroscopic images were obtained for documentation. A Bard Denali filter was deployed below the lowest renal vein. Post placement images of the filter were obtained. The vascular sheath was removed with manual compression. Bandage placed over the puncture site. FINDINGS: IVC was patent. Bilateral renal veins were identified. The filter was deployed below the lowest renal vein. IMPRESSION: Successful placement of a retrievable IVC filter. PLAN: This IVC filter is potentially retrievable. The patient will be assessed for filter retrieval by Interventional Radiology in approximately 8-12 weeks. Further  recommendations regarding filter retrieval, continued surveillance or declaration of device permanence, will be made at that time. Electronically Signed   By: Richarda OverlieAdam  Henn M.D.   On: 05/24/2020 17:57   DG Chest Port 1 View  Result Date: 06/11/2020 CLINICAL DATA:  Shortness of breath. EXAM: PORTABLE CHEST 1 VIEW COMPARISON:  June 10, 2020. FINDINGS: The heart size and mediastinal contours are within  normal limits. No pneumothorax is noted. Stable bibasilar opacities are noted concerning for atelectasis or infiltrates with associated pleural effusions, right greater than left. The visualized skeletal structures are unremarkable. IMPRESSION: Stable bibasilar opacities are noted concerning for atelectasis or infiltrates with associated pleural effusions, right greater than left. Electronically Signed   By: Lupita Raider M.D.   On: 06/11/2020 11:33   DG Chest Port 1 View  Result Date: 06/10/2020 CLINICAL DATA:  Shortness of breath EXAM: PORTABLE CHEST 1 VIEW COMPARISON:  05/26/2020 FINDINGS: Numerous leads and wires project over the chest. Midline trachea. Normal heart size. Suspect layering tiny bilateral pleural effusions. No pneumothorax. Left greater than right base airspace disease is slightly improved, given differences in technique. Suspect underlying mild pulmonary venous congestion. Removal of left internal jugular line. IMPRESSION: Overall mildly improved aeration. Persistent layering bilateral pleural effusions and slightly improved airspace disease. Suspect developing pulmonary venous congestion. Electronically Signed   By: Jeronimo Greaves M.D.   On: 06/10/2020 15:58   DG CHEST PORT 1 VIEW  Result Date: 05/26/2020 CLINICAL DATA:  Acute respiratory failure.  Hypoxia. EXAM: PORTABLE CHEST 1 VIEW COMPARISON:  05/22/2020. FINDINGS: Interim extubation and removal of NG tube. Left IJ line in stable position. Heart size normal. Diffuse left lung infiltrate. Right base infiltrate. No prominent pleural  effusion. No pneumothorax. IMPRESSION: 1. Interim extubation and removal of NG tube. Left IJ line stable position. 2. Diffuse left lung infiltrate and right base infiltrate. Electronically Signed   By: Maisie Fus  Register   On: 05/26/2020 05:50   DG CHEST PORT 1 VIEW  Result Date: 05/22/2020 CLINICAL DATA:  Central line placement, intubation EXAM: PORTABLE CHEST 1 VIEW COMPARISON:  05/21/2020 FINDINGS: Left central line has been placed with the tip in the SVC. No pneumothorax. Endotracheal tube is 5 cm above the carina. Patchy airspace disease throughout the left lung. No focal opacity on the right. Heart is normal size. IMPRESSION: Endotracheal tube 5 cm above the carina. Left central line tip in the SVC. No pneumothorax. Stable patchy left lung airspace disease. Electronically Signed   By: Charlett Nose M.D.   On: 05/22/2020 21:06   DG Chest Port 1 View  Result Date: 05/21/2020 CLINICAL DATA:  Cough, lower back pain, has not eaten 5 days, emesis EXAM: PORTABLE CHEST 1 VIEW COMPARISON:  Radiograph 04/19/2010, CT 04/18/2010 FINDINGS: Heterogeneous opacities present predominantly along the periphery of the left lung and minimally in the right lung base as well. No pneumothorax or effusion. The aorta is calcified. The remaining cardiomediastinal contours are unremarkable. No acute osseous or soft tissue abnormality. Degenerative changes are present in the imaged spine and shoulders. Telemetry leads overlie the chest. IMPRESSION: Heterogeneous opacities throughout the lungs, greatest in the left lung periphery concerning for pneumonia including potential viral etiology. Aortic Atherosclerosis (ICD10-I70.0). Electronically Signed   By: Kreg Shropshire M.D.   On: 05/21/2020 03:50   DG Abd Portable 1V  Result Date: 05/23/2020 CLINICAL DATA:  Encounter for orogastric tube placement. EXAM: PORTABLE ABDOMEN - 1 VIEW COMPARISON:  06/09/2009 and chest radiograph 05/22/2020 FINDINGS: Gastric tube extends into the  abdomen and the tip is in the midline of the lower abdomen, likely in the distal stomach body region. Again noted are patchy densities at the left lung base. Few gas-filled loops of bowel in the abdomen. IMPRESSION: Orogastric tube in the lower abdominal midline and likely in the distal stomach body region. Electronically Signed   By: Richarda Overlie M.D.   On: 05/23/2020  12:17   ECHOCARDIOGRAM COMPLETE  Result Date: 05/23/2020    ECHOCARDIOGRAM REPORT   Patient Name:   SUHAYB ANZALONE Date of Exam: 05/23/2020 Medical Rec #:  [id number]      Height:       72.0 in Accession #:    [id number]     Weight:       142.4 lb Date of Birth:  April 03, 1951      BSA:          1.844 m Patient Age:    69 years       BP:           112/85 mmHg Patient Gender: M              HR:           96 bpm. Exam Location:  Inpatient Procedure: 2D Echo, Cardiac Doppler and Color Doppler STAT ECHO Indications:    I26.02 Pulmonary embolus  History:        Patient has no prior history of Echocardiogram examinations.                 Arrythmias:Cardiac Arrest; Signs/Symptoms:Dyspnea, Hypotension                 and Altered Mental Status. Covid 19 positive.  Sonographer:    Sheralyn Boatman RDCS Referring Phys: [id number] Martina Sinner  Sonographer Comments: Technically difficult study due to poor echo windows and echo performed with patient supine and on artificial respirator. IMPRESSIONS  1. Severe RV failure and cor pulmonale.  2. Abnormal septal motion and septal flattening consistent with RV pressure overload. Left ventricular ejection fraction, by estimation, is 50 to 55%. The left ventricle has low normal function. The left ventricle has no regional wall motion abnormalities. Left ventricular diastolic parameters were normal.  3. Right ventricular systolic function is severely reduced. The right ventricular size is severely enlarged. There is mildly elevated pulmonary artery systolic pressure.  4. The mitral valve is normal in structure. No evidence of  mitral valve regurgitation. No evidence of mitral stenosis.  5. The aortic valve is normal in structure. Aortic valve regurgitation is not visualized. No aortic stenosis is present.  6. The inferior vena cava is dilated in size with <50% respiratory variability, suggesting right atrial pressure of 15 mmHg. FINDINGS  Left Ventricle: Abnormal septal motion and septal flattening consistent with RV pressure overload. Left ventricular ejection fraction, by estimation, is 50 to 55%. The left ventricle has low normal function. The left ventricle has no regional wall motion abnormalities. The left ventricular internal cavity size was normal in size. There is no left ventricular hypertrophy. Left ventricular diastolic parameters were normal. Right Ventricle: The right ventricular size is severely enlarged. Right vetricular wall thickness was not assessed. Right ventricular systolic function is severely reduced. There is mildly elevated pulmonary artery systolic pressure. The tricuspid regurgitant velocity is 2.67 m/s, and with an assumed right atrial pressure of 8 mmHg, the estimated right ventricular systolic pressure is 36.5 mmHg. Left Atrium: Left atrial size was normal in size. Right Atrium: Right atrial size was normal in size. Pericardium: There is no evidence of pericardial effusion. Mitral Valve: The mitral valve is normal in structure. No evidence of mitral valve regurgitation. No evidence of mitral valve stenosis. Tricuspid Valve: The tricuspid valve is normal in structure. Tricuspid valve regurgitation is mild . No evidence of tricuspid stenosis. Aortic Valve: The aortic valve is normal in structure. Aortic valve regurgitation is not visualized.  No aortic stenosis is present. Pulmonic Valve: The pulmonic valve was normal in structure. Pulmonic valve regurgitation is not visualized. No evidence of pulmonic stenosis. Aorta: The aortic root is normal in size and structure. Venous: The inferior vena cava is dilated in  size with less than 50% respiratory variability, suggesting right atrial pressure of 15 mmHg. IAS/Shunts: No atrial level shunt detected by color flow Doppler. Additional Comments: Severe RV failure and cor pulmonale.  LEFT VENTRICLE PLAX 2D LVIDd:         3.70 cm     Diastology LVIDs:         2.80 cm     LV e' medial:    4.46 cm/s LV PW:         1.10 cm     LV E/e' medial:  4.9 LV IVS:        1.10 cm     LV e' lateral:   6.97 cm/s LVOT diam:     2.10 cm     LV E/e' lateral: 3.1 LV SV:         28 LV SV Index:   15 LVOT Area:     3.46 cm  LV Volumes (MOD) LV vol d, MOD A2C: 22.6 ml LV vol d, MOD A4C: 25.5 ml LV vol s, MOD A2C: 11.1 ml LV vol s, MOD A4C: 11.7 ml LV SV MOD A2C:     11.5 ml LV SV MOD A4C:     25.5 ml LV SV MOD BP:      13.7 ml RIGHT VENTRICLE            IVC RV S prime:     8.84 cm/s  IVC diam: 2.50 cm TAPSE (M-mode): 1.3 cm LEFT ATRIUM           Index      RIGHT ATRIUM           Index LA diam:      2.80 cm 1.52 cm/m RA Area:     14.30 cm LA Vol (A4C): 10.5 ml 5.69 ml/m RA Volume:   42.70 ml  23.15 ml/m  AORTIC VALVE LVOT Vmax:   53.80 cm/s LVOT Vmean:  41.900 cm/s LVOT VTI:    0.080 m  AORTA Ao Root diam: 3.60 cm Ao Asc diam:  3.50 cm MITRAL VALVE               TRICUSPID VALVE MV Area (PHT): 3.27 cm    TR Peak grad:   28.5 mmHg MV Decel Time: 232 msec    TR Vmax:        267.00 cm/s MV E velocity: 21.94 cm/s MV A velocity: 41.30 cm/s  SHUNTS MV E/A ratio:  0.53        Systemic VTI:  0.08 m                            Systemic Diam: 2.10 cm Charlton Haws MD Electronically signed by Charlton Haws MD Signature Date/Time: 05/23/2020/10:46:38 AM    Final    CT RENAL STONE STUDY  Result Date: 05/21/2020 CLINICAL DATA:  69 year old male with low back pain, flank pain, decreased p.o. EXAM: CT ABDOMEN AND PELVIS WITHOUT CONTRAST TECHNIQUE: Multidetector CT imaging of the abdomen and pelvis was performed following the standard protocol without IV contrast. COMPARISON:  Noncontrast CT Abdomen and Pelvis  10/28/2008. Portable chest radiograph 04/19/2010. FINDINGS: Lower chest: Patchy, irregular peribronchial and peripheral scattered pulmonary  opacity at both lung bases. This seems to be new from last month. Underlying probable pulmonary hyperinflation. Some associated lung base bronchiectasis. No cavitating areas identified. No cardiomegaly, pericardial effusion or pleural effusion. Hepatobiliary: Negative noncontrast liver and gallbladder. Pancreas: Negative. Spleen: Negative. Adrenals/Urinary Tract: Bulky left nephrolithiasis measuring 13 mm at the renal pelvis. Additional left lower pole stone. No hydronephrosis. Negative noncontrast right kidney. No hydroureter. Unremarkable urinary bladder. Incidental pelvic phleboliths. Stomach/Bowel: Decompressed large bowel. There is long segment circumferential wall thickening in the right colon, up to the 12 mm series 4, image 49) with some areas of adjacent mesenteric stranding (coronal image 43). The wall thickening gradually decreases through the transverse colon. No free air. No free fluid. The terminal ileum appear spared. Appendix seems to remain normal on coronal image 32. No dilated small bowel.  Decompressed stomach and duodenum. Vascular/Lymphatic: Normal caliber abdominal aorta. Mild calcified atherosclerosis. Vascular patency is not evaluated in the absence of IV contrast. No lymphadenopathy identified in the absence of contrast. Reproductive: Negative. Other: Previous lower abdominal ventral hernia repair with mesh. No pelvic free fluid. Musculoskeletal: Degenerative changes in the spine. No acute osseous abnormality identified. IMPRESSION: 1. In the abdomen there is circumferential bowel wall thickening and mesenteric stranding involving the right colon. Wall thickening gradually abates through the transverse colon. This is compatible with Acute Nonspecific Colitis. 2. But the lung bases are also abnormal with patchy and irregular peribronchial and peripheral  pulmonary opacity suspicious for acute viral/atypical respiratory infection. No areas of cavitation to suggest septic emboli. No pleural effusion. 3. Bulky left nephrolithiasis, but no obstructive uropathy. 4. Previous lower abdominal hernia repair with mesh. Electronically Signed   By: Odessa Fleming M.D.   On: 05/21/2020 02:46   VAS [place] LOWER EXTREMITY VENOUS (DVT)  Result Date: 05/24/2020  Lower Venous DVT Study Indications: Covid-19, pulmonary embolism.  Risk Factors: Confirmed PE. Anticoagulation: Heparin. Limitations: Body habitus, ventilation, did not perform compressions in thigh and popliteal fossa of the left lower extremity secondary to mobile thrombus, bandages and line. Comparison Study: No prior study Performing Technologist: Sherren Kerns RVS  Examination Guidelines: A complete evaluation includes B-mode imaging, spectral Doppler, color Doppler, and power Doppler as needed of all accessible portions of each vessel. Bilateral testing is considered an integral part of a complete examination. Limited examinations for reoccurring indications may be performed as noted. The reflux portion of the exam is performed with the patient in reverse Trendelenburg.  +---------+---------------+---------+-----------+----------+--------------+ RIGHT    CompressibilityPhasicitySpontaneityPropertiesThrombus Aging +---------+---------------+---------+-----------+----------+--------------+ CFV      Full           Yes      No                                  +---------+---------------+---------+-----------+----------+--------------+ SFJ      Full                                                        +---------+---------------+---------+-----------+----------+--------------+ FV Prox  Full                                                        +---------+---------------+---------+-----------+----------+--------------+  FV Mid   Full                                                         +---------+---------------+---------+-----------+----------+--------------+ FV DistalFull                                                        +---------+---------------+---------+-----------+----------+--------------+ PFV      Full                                                        +---------+---------------+---------+-----------+----------+--------------+ POP      Full           Yes      No                                  +---------+---------------+---------+-----------+----------+--------------+ PTV      Full                                                        +---------+---------------+---------+-----------+----------+--------------+ PERO     Full                                                        +---------+---------------+---------+-----------+----------+--------------+   +---------+---------------+---------+-----------+----------+-------------------+ LEFT     CompressibilityPhasicitySpontaneityPropertiesThrombus Aging      +---------+---------------+---------+-----------+----------+-------------------+ CFV                                                   not visualized                                                            secondary to                                                              bandage/line        +---------+---------------+---------+-----------+----------+-------------------+ SFJ  not visualized                                                            secondary to                                                              bandage/line        +---------+---------------+---------+-----------+----------+-------------------+ FV Prox  Full                                         patent by color     +---------+---------------+---------+-----------+----------+-------------------+ FV Mid                                                 patent by color     +---------+---------------+---------+-----------+----------+-------------------+ FV Distal                                             patent by color     +---------+---------------+---------+-----------+----------+-------------------+ PFV                                                   patent by color     +---------+---------------+---------+-----------+----------+-------------------+ POP                                                   patent by color     +---------+---------------+---------+-----------+----------+-------------------+ PTV      Full                                                             +---------+---------------+---------+-----------+----------+-------------------+ PERO     Full                                                             +---------+---------------+---------+-----------+----------+-------------------+     Summary: RIGHT: - There is no evidence of deep vein thrombosis in the lower extremity.  vessels are dilated with significantly sluggish flow, throughout  LEFT: - Findings consistent with acute deep vein thrombosis involving the left femoral vein. - Acute, mobile thrombus noted in the  proximal and mid femoral vein. Vessels are dilated with significantly sluggish flow, throughout.  *See table(s) above for measurements and observations. Electronically signed by Waverly Ferrari MD on 05/24/2020 at 6:21:22 PM.    Final

## 2020-06-12 NOTE — TOC Progression Note (Signed)
Transition of Care [facility]) - Progression Note    Patient Details  Name: AENEAS LONGSWORTH MRN: [id number] Date of Birth: 1950-08-27  Transition of Care [facility]) CM/SW Contact  Mearl Latin, LCSW Phone Number: 06/12/2020, 3:42 PM  Clinical Narrative:    [facility] awaiting insurance approval for possible discharge tomorrow.    Expected Discharge Plan: Skilled Nursing Facility Barriers to Discharge: Barriers Resolved  Expected Discharge Plan and Services Expected Discharge Plan: Skilled Nursing Facility In-house Referral: Clinical Social Work Discharge Planning Services: CM Consult Post Acute Care Choice: Skilled Nursing Facility Living arrangements for the past 2 months: Single Family Home Expected Discharge Date: 06/10/20                                     Social Determinants of Health (SDOH) Interventions    Readmission Risk Interventions No flowsheet data found.

## 2020-06-13 ENCOUNTER — Inpatient Hospital Stay (HOSPITAL_COMMUNITY): Payer: Medicare HMO

## 2020-06-13 LAB — COMPREHENSIVE METABOLIC PANEL
ALT: 85 U/L — ABNORMAL HIGH (ref 0–44)
AST: 74 U/L — ABNORMAL HIGH (ref 15–41)
Albumin: 1.4 g/dL — ABNORMAL LOW (ref 3.5–5.0)
Alkaline Phosphatase: 270 U/L — ABNORMAL HIGH (ref 38–126)
Anion gap: 8 (ref 5–15)
BUN: 9 mg/dL (ref 8–23)
CO2: 26 mmol/L (ref 22–32)
Calcium: 8.3 mg/dL — ABNORMAL LOW (ref 8.9–10.3)
Chloride: 98 mmol/L (ref 98–111)
Creatinine, Ser: 0.88 mg/dL (ref 0.61–1.24)
GFR, Estimated: >60 mL/min (ref >60)
Glucose, Bld: 94 mg/dL (ref 70–99)
Potassium: 4.3 mmol/L (ref 3.5–5.1)
Sodium: 132 mmol/L — ABNORMAL LOW (ref 135–145)
Total Bilirubin: 0.6 mg/dL (ref 0.3–1.2)
Total Protein: 5.2 g/dL — ABNORMAL LOW (ref 6.5–8.1)

## 2020-06-13 LAB — C-REACTIVE PROTEIN: CRP: 14.8 mg/dL — ABNORMAL HIGH (ref <1.0)

## 2020-06-13 LAB — CBC WITH DIFFERENTIAL/PLATELET
Abs Immature Granulocytes: 0.2 10*3/uL — ABNORMAL HIGH (ref 0.00–0.07)
Basophils Absolute: 0.1 10*3/uL (ref 0.0–0.1)
Basophils Relative: 1 %
Eosinophils Absolute: 0.2 10*3/uL (ref 0.0–0.5)
Eosinophils Relative: 2 %
HCT: 26.2 % — ABNORMAL LOW (ref 39.0–52.0)
Hemoglobin: 8.7 g/dL — ABNORMAL LOW (ref 13.0–17.0)
Immature Granulocytes: 2 %
Lymphocytes Relative: 11 %
Lymphs Abs: 1 10*3/uL (ref 0.7–4.0)
MCH: 28.9 pg (ref 26.0–34.0)
MCHC: 33.2 g/dL (ref 30.0–36.0)
MCV: 87 fL (ref 80.0–100.0)
Monocytes Absolute: 0.6 10*3/uL (ref 0.1–1.0)
Monocytes Relative: 7 %
Neutro Abs: 7.3 10*3/uL (ref 1.7–7.7)
Neutrophils Relative %: 77 %
Platelets: 624 10*3/uL — ABNORMAL HIGH (ref 150–400)
RBC: 3.01 MIL/uL — ABNORMAL LOW (ref 4.22–5.81)
RDW: 14.9 % (ref 11.5–15.5)
WBC: 9.5 10*3/uL (ref 4.0–10.5)
nRBC: 0 % (ref 0.0–0.2)

## 2020-06-13 LAB — GLUCOSE, CAPILLARY
Glucose-Capillary: 108 mg/dL — ABNORMAL HIGH (ref 70–99)
Glucose-Capillary: 122 mg/dL — ABNORMAL HIGH (ref 70–99)
Glucose-Capillary: 117 mg/dL — ABNORMAL HIGH (ref 70–99)
Glucose-Capillary: 107 mg/dL — ABNORMAL HIGH (ref 70–99)

## 2020-06-13 LAB — PROCALCITONIN: Procalcitonin: 0.16 ng/mL

## 2020-06-13 LAB — D-DIMER, QUANTITATIVE: D-Dimer, Quant: 3.42 ug/mL-FEU — ABNORMAL HIGH (ref 0.00–0.50)

## 2020-06-13 LAB — MAGNESIUM: Magnesium: 2 mg/dL (ref 1.7–2.4)

## 2020-06-13 LAB — BRAIN NATRIURETIC PEPTIDE: B Natriuretic Peptide: 54.4 pg/mL (ref 0.0–100.0)

## 2020-06-13 MED ORDER — VANCOMYCIN 50 MG/ML ORAL SOLUTION
125.0000 mg | Freq: Four times a day (QID) | ORAL | Status: AC
  Administered 2020-06-13 – 2020-06-23 (×39): 125 mg via ORAL
  Filled 2020-06-13 (×44): qty 2.5

## 2020-06-13 NOTE — Progress Notes (Signed)
PROGRESS NOTE                                                                                                                                                                                                             Patient Demographics:    Dwayne Barnes, is a 69 y.o. male, DOB - 22-Nov-1950, OZH:[id number]  Outpatient Primary MD for the patient is Dwayne Daniel, PA-C   Admit date - 05/20/2020   LOS - 23  Chief Complaint  Patient presents with  . Fatigue       Brief Narrative: Patient is a 69 y.o. male with no significant past medical history-presented to the ED on 11/17 with nausea/vomiting, weakness and back pain-further evaluation revealed COVID-19 infection and a large PE.  Patient was started on IV heparin infusion along with steroid/Remdesivir-on 11/19-patient had a cardiac arrest-was intubated during the code-total downtime of around 6 minutes before ROSC.  Patient was subsequently transferred to the ICU-unfortunately soon after arrival in the ICU-he had another cardiac arrest-and 4 rounds of CPR were provided with ROSC.  Patient was provided supportive care-he subsequently improved-self extubated on 11/23-he was [facility] on 11/25.  Further hospital course complicated by diarrhea secondary to C. difficile along with AKI and hyponatremia.See below for further details.   COVID-19 vaccinated status: Vaccinated  Significant Events: 11/17>> Admit to [facility] for PE/COVID-19 infection 11/19>> PEA cardiac arrest x 2-given TPA-intubated-transfer to ICU 11/23>> Self extubated 11/25>> transfer to [facility] 11/29>> worsening diarrhea-CT abdomen with progressive colitis  Significant studies: 11/18>>CT renal stone: non specific colitis, GGO's, left nephrolithiasis without obstructive uropathy. 11/18>>CTA chest: large PC in LLL, GGO's LLL. 11/20 echo>> EF 50-55%, RV systolic function severely  reduced. 11/21>> B/L extremity Doppler: acute DVTin left femoral vein, acute mobile thrombus in the proximal femoral vein. 11/23>>CT Head: unremarkable   11/29>> CT abdomen/pelvis>> progressive colitis  COVID-19 medications: Steroids: 11/17>>11/26 Remdesivir: 11/17>> 11/22  Antibiotics: Oral vancomycin: 11/30>>  Microbiology data: 11/18>> urine culture: Insignificant growth 11/30>> stool C. difficile: +ve 11/30>> GI pathogen panel: negative  Procedures: ETT>>11/19 - 11/23 IVC filter placement by IR>>11/21.  Consults: PCCM, IR  DVT prophylaxis: Therapeutic Lovenox>> Eliquis    Subjective:   In bed no distress, denies any chest or abdominal pain.   Assessment  & Plan :   Cardiac arrest due to large  PE: S/p TPA-was on therapeutic Lovenox-we will transition to Eliquis today. PE likely provoked by COVID-19 infection.  PCCM MD discussed with Dr. Illa Level indication for any acute intervention.  Continue telemetry monitoring.  Echo as above.  Large PE with DVT: Continue therapeutic anticoagulation-he is s/p TPA on 11/19 when he had cardiac arrest.  Patient is s/p IVC filter given mobile thrombus seen on lower extremity Doppler.  Once oral intake was stabilized-he was transitioned to Eliquis.  Acute hypoxic respiratory failure: Due to PE/cardiac arrest/COVID-19 pneumonia-self extubated on 11/23-currently stable on room air.  Acute metabolic encephalopathy/ICU delirium/possible anoxic injury: Mental status continues to slowly improve-getting more fluent with responses.  CT head without acute changes.  Continue supportive care.  Minimize all sedating medications.    Dysphagia: In the setting of cardiac arrest-seen by SLP-required NG tube-subsequently seen by SLP-diet gradually upgraded to a dysphagia 3 diet.  C. difficile colitis: Diarrhea improving-leukocytosis/AKI all getting better.  Continue oral vancomycin.  Fever evening of 06/10/2020.  Clinically appears to have  aspirated, MRSA nasal PCR positive, UA also positive, follow blood and urine cultures, trend procalcitonin, given 1 dose of IV vancomycin and 1 dose of oral Keflex on 06/10/2020.  Due to his underlying severe C. difficile trying to avoid antibiotics unless absolutely needed.  Speech is following, cultures negative thus far, holding off further antibiotics and clinically monitoring, if afebrile then likely discharge to SNF on 06/13/2020.  AKI: Likely hemodynamically mediated-due to diarrhea-has resolved with IV fluids and improvement in diarrhea.  COVID-19 pneumonia: Completed a course of Remdesivir-an steroids.  Hypoxia has resolved.   Paroxysmal atrial fibrillation: appears to be back in sinus rhythm-on anticoagulation for PE.  Echo as above.    Normocytic anemia: Secondary to critical illness-stable for monitoring-no indication for PRBC transfusion.  Transaminitis: Likely secondary to COVID-19-downtrending.  Deconditioning/debility: Secondary to critical illness- PT/OT eval completed-recommendations are for SNF, SNF does not want him at the SNF till his stools are formed and C. difficile has clinically resolved.  Monitoring closely.  Hard of hearing - supportive care.  Hyponatremia.  Persistent.  Became hypotensive 06/08/2020 hence IV fluids, other electrolytes including low serum uric acid point more towards SIADH but due to hypotension requires fluids.  Will monitor.  Also drinks more than 5-6 bottles of soda every day, requested to cut down.   Recent Labs  Lab 06/08/20 0514 06/09/20 0054 06/10/20 1548 06/11/20 0116 06/12/20 0127 06/13/20 0257  WBC 9.4 9.4 10.5 10.5 8.9 9.5  HGB 9.2* 8.1* 8.5* 8.7* 8.3* 8.7*  HCT 28.5* 25.4* 25.6* 26.1* 25.9* 26.2*  PLT 510* 452* 582* 534* 551* 624*  CRP  --   --  22.6* 21.1* 19.1* 14.8*  BNP  --   --   --  204.3* 74.4 54.4  DDIMER  --   --   --  2.83* 3.76* 3.42*  PROCALCITON  --   --  0.15 0.13 0.12 0.16  AST 79* 46*  --  33 38 74*  ALT 105*  78*  --  61* 56* 85*  ALKPHOS 203* 182*  --  197* 224* 270*  BILITOT 1.0 0.6  --  0.8 0.8 0.6  ALBUMIN 1.5* 1.4*  --  1.3* 1.4* 1.4*  LATICACIDVEN  --   --   --  1.0  --   --        GI prophylaxis: H2 blocker.  Condition -  Guarded  Family Communication  :  Daughter Claude Waldman [phone])  updated over the phone  06/05/20 by previous MD.  I called on 06/06/2020, 06/09/20, 06/10/20, 06/11/20, 06/13/20  Code Status :  Full Code  Diet :   Diet Order            DIET DYS 3 Room service appropriate? No; Fluid consistency: Thin; Fluid restriction: 1200 mL Fluid  Diet effective now                  Disposition Plan  :  Status is: Inpatient  Remains inpatient appropriate because:Inpatient level of care appropriate due to severity of illness   Dispo: The patient is from: Home              Anticipated d/c is to: SNF              Anticipated d/c date is: > 1 day              Patient currently is not medically stable to d/c.   Barriers to discharge: Persistent hyponatremia  Antimicorbials  :    Anti-infectives (From admission, onward)   Start     Dose/Rate Route Frequency Ordered Stop   06/13/20 1200  vancomycin (VANCOCIN) 50 mg/mL oral solution 125 mg        125 mg Oral Every 6 hours 06/13/20 0933     06/10/20 2230  vancomycin (VANCOREADY) IVPB 1250 mg/250 mL        1,250 mg 166.7 mL/hr over 90 Minutes Intravenous  Once 06/10/20 2139 06/11/20 0140   06/10/20 2230  cephALEXin (KEFLEX) capsule 500 mg        500 mg Oral  Once 06/10/20 2139 06/10/20 2155   06/10/20 0000  vancomycin (VANCOCIN) 50 mg/mL oral solution        125 mg Oral 4 times daily 06/10/20 1053 06/15/20 2359   06/02/20 1000  vancomycin (VANCOCIN) 50 mg/mL oral solution 125 mg        125 mg Oral 4 times daily 06/02/20 0800 06/11/20 2158   05/22/20 1000  remdesivir 100 mg in sodium chloride 0.9 % 100 mL IVPB       "Followed by" Linked Group Details   100 mg 200 mL/hr over 30 Minutes Intravenous Daily 05/21/20  0857 05/25/20 1154   05/21/20 1030  remdesivir 200 mg in sodium chloride 0.9% 250 mL IVPB  Status:  Discontinued       "Followed by" Linked Group Details   200 mg 580 mL/hr over 30 Minutes Intravenous Once 05/21/20 0857 05/27/20 0734   05/21/20 0330  metroNIDAZOLE (FLAGYL) tablet 500 mg        500 mg Oral  Once 05/21/20 0320 05/21/20 0411   05/20/20 2330  cefTRIAXone (ROCEPHIN) 2 g in sodium chloride 0.9 % 100 mL IVPB        2 g 200 mL/hr over 30 Minutes Intravenous  Once 05/20/20 2324 05/21/20 0331      Inpatient Medications  Scheduled Meds: . apixaban  5 mg Oral BID  . Chlorhexidine Gluconate Cloth  6 each Topical Q0600  . famotidine  20 mg Oral Daily  . feeding supplement  237 mL Oral TID BM  . insulin aspart  0-9 Units Subcutaneous TID AC & HS  . mupirocin ointment  1 application Nasal BID  . sodium chloride flush  10-40 mL Intracatheter Q12H  . vancomycin  125 mg Oral Q6H   Continuous Infusions:  PRN Meds:.acetaminophen (TYLENOL) oral liquid 160 mg/5 mL, [DISCONTINUED] ondansetron **OR** ondansetron (ZOFRAN) IV   Time Spent in minutes  25   See all Orders from today for further details   Susa Raring M.D on 06/13/2020 at 9:33 AM  To page go to [email or web address] - use universal password  [facility] -  Office  [phone]    Objective:   Vitals:   06/12/20 1543 06/12/20 2119 06/13/20 0518 06/13/20 0600  BP: 101/62 108/64 105/66   Pulse: 97 84 85   Resp: 20 19 20    Temp: (!) 100.6 F (38.1 C) 98.2 F (36.8 C) 98.3 F (36.8 C)   TempSrc: Oral Oral Axillary   SpO2: 96%  100%   Weight:    59.4 kg  Height:        Wt Readings from Last 3 Encounters:  06/13/20 59.4 kg  12/20/14 66 kg     Intake/Output Summary (Last 24 hours) at 06/13/2020 0933 Last data filed at 06/13/2020 0900 Gross per 24 hour  Intake 360 ml  Output 1250 ml  Net -890 ml     Physical Exam  Awake Alert, No new F.N deficits, Normal affect, extremely hard of  hearing .AT,PERRAL Supple Neck,No JVD, No cervical lymphadenopathy appriciated.  Symmetrical Chest wall movement, Good air movement bilaterally, CTAB RRR,No Gallops, Rubs or new Murmurs, No Parasternal Heave +ve B.Sounds, Abd Soft, No tenderness, No organomegaly appriciated, No rebound - guarding or rigidity. No Cyanosis, Clubbing or edema, No new Rash or bruise     Data Review:    CBC Recent Labs  Lab 06/08/20 0514 06/09/20 0054 06/10/20 1548 06/11/20 0116 06/12/20 0127 06/13/20 0257  WBC 9.4 9.4 10.5 10.5 8.9 9.5  HGB 9.2* 8.1* 8.5* 8.7* 8.3* 8.7*  HCT 28.5* 25.4* 25.6* 26.1* 25.9* 26.2*  PLT 510* 452* 582* 534* 551* 624*  MCV 87.4 88.2 86.2 85.3 86.6 87.0  MCH 28.2 28.1 28.6 28.4 27.8 28.9  MCHC 32.3 31.9 33.2 33.3 32.0 33.2  RDW 14.2 14.4 14.6 14.5 14.6 14.9  LYMPHSABS 1.2 1.2  --  0.9 1.0 1.0  MONOABS 0.7 0.7  --  0.6 0.6 0.6  EOSABS 0.1 0.2  --  0.2 0.2 0.2  BASOSABS 0.0 0.0  --  0.1 0.1 0.1    Chemistries  Recent Labs  Lab 06/08/20 0514 06/09/20 0054 06/10/20 1548 06/11/20 0116 06/12/20 0127 06/13/20 0257  NA 127* 130* 128* 130* 130* 132*  K 4.5 3.7 4.3 4.2 4.0 4.3  CL 95* 97* 95* 98 98 98  CO2 23 24 24 24 23 26   GLUCOSE 108* 142* 112* 114* 97 94  BUN 11 10 14 10 8 9   CREATININE 1.08 1.09 1.05 1.01 0.87 0.88  CALCIUM 8.0* 7.7* 7.9* 7.9* 8.1* 8.3*  MG 1.9 1.9 1.9 1.9 2.0 2.0  AST 79* 46*  --  33 38 74*  ALT 105* 78*  --  61* 56* 85*  ALKPHOS 203* 182*  --  197* 224* 270*  BILITOT 1.0 0.6  --  0.8 0.8 0.6   ------------------------------------------------------------------------------------------------------------------ No results for input(s): CHOL, HDL, LDLCALC, TRIG, CHOLHDL, LDLDIRECT in the last 72 hours.  Lab Results  Component Value Date   HGBA1C 5.5 05/22/2020   ------------------------------------------------------------------------------------------------------------------ No results for input(s): TSH, T4TOTAL, T3FREE, THYROIDAB in  the last 72 hours.  Invalid input(s): FREET3 ------------------------------------------------------------------------------------------------------------------ No results for input(s): VITAMINB12, FOLATE, FERRITIN, TIBC, IRON, RETICCTPCT in the last 72 hours.  Coagulation profile No results for input(s): INR, PROTIME in the last 168 hours.  Recent Labs    06/12/20 0127 06/13/20 0257  DDIMER 3.76* 3.42*    Cardiac  Enzymes No results for input(s): CKMB, TROPONINI, MYOGLOBIN in the last 168 hours.  Invalid input(s): CK ------------------------------------------------------------------------------------------------------------------    Component Value Date/Time   BNP 54.4 06/13/2020 0257    Micro Results Recent Results (from the past 240 hour(s))  Culture, blood (routine x 2)     Status: None (Preliminary result)   Collection Time: 06/10/20  3:35 PM   Specimen: BLOOD  Result Value Ref Range Status   Specimen Description BLOOD LEFT ANTECUBITAL  Final   Special Requests   Final    BOTTLES DRAWN AEROBIC ONLY Blood Culture adequate volume   Culture   Final    NO GROWTH 2 DAYS Performed at [facility], [address], [place], [place] [address]    Report Status PENDING  Incomplete  Culture, blood (routine x 2)     Status: None (Preliminary result)   Collection Time: 06/10/20  3:46 PM   Specimen: BLOOD LEFT ARM  Result Value Ref Range Status   Specimen Description BLOOD LEFT ARM  Final   Special Requests   Final    BOTTLES DRAWN AEROBIC ONLY Blood Culture adequate volume   Culture   Final    NO GROWTH 2 DAYS Performed at [facility], [address] [place], [place] [address]    Report Status PENDING  Incomplete  MRSA PCR Screening     Status: Abnormal   Collection Time: 06/10/20  4:27 PM   Specimen: Nasal Mucosa; Nasopharyngeal  Result Value Ref Range Status   MRSA by PCR POSITIVE (A) NEGATIVE Final    Comment:        The GeneXpert MRSA Assay  (FDA approved for NASAL specimens only), is one component of a comprehensive MRSA colonization surveillance program. It is not intended to diagnose MRSA infection nor to guide or monitor treatment for MRSA infections. RESULT CALLED TO, READ BACK BY AND VERIFIED WITH: Kerrin Champagne RN 06/10/20 at 1943 sk  Performed at [facility], [address] [place], [place] [address]     Radiology Reports CT ABDOMEN PELVIS WO CONTRAST  Result Date: 06/01/2020 CLINICAL DATA:  Left upper quadrant diarrhea, history of COVID-19 pneumonia. EXAM: CT ABDOMEN AND PELVIS WITHOUT CONTRAST TECHNIQUE: Multidetector CT imaging of the abdomen and pelvis was performed following the standard protocol without IV contrast. COMPARISON:  05/21/2020 FINDINGS: Lower chest: Bibasilar consolidation is noted increased when compared with the prior exam. These changes are worse on the left than the right. Stable changes of prior COVID-19 pneumonia are noted. No sizable effusion is seen. Hepatobiliary: No focal liver abnormality is seen. No gallstones, gallbladder wall thickening, or biliary dilatation. Pancreas: Unremarkable. No pancreatic ductal dilatation or surrounding inflammatory changes. Spleen: Normal in size without focal abnormality. Adrenals/Urinary Tract: Adrenal glands are within normal limits. Kidneys demonstrate left renal pelvis stone stable in appearance. No obstructive changes are seen. The bladder is well distended. Stomach/Bowel: Diffuse edematous changes of the colonic wall are seen throughout its course increased when compared with the prior exam consistent with a generalized colitis. Some pericolonic inflammatory changes noted particularly on the right increased from the prior exam. The appendix appears within normal limits. Small bowel and stomach are unremarkable. Vascular/Lymphatic: Atherosclerotic calcifications of the aorta are noted. IVC filter is noted in place. No sizable lymphadenopathy is noted.  Reproductive: Prostate is unremarkable. Other: No abdominal wall hernia or abnormality. No abdominopelvic ascites. Changes of prior hernia repair are noted in the lower abdomen bilaterally Musculoskeletal: Degenerative changes of lumbar  spine are noted. IMPRESSION: Increasing colonic wall thickness with pericolonic inflammatory change consistent with progressive colitis. Increasing bilateral lower lobe consolidation without sizable effusion. Some scattered changes consistent with the COVID-19 pneumonia are noted. Stable nonobstructing left renal stone. Electronically Signed   By: Alcide Clever M.D.   On: 06/01/2020 23:59   CT HEAD WO CONTRAST  Result Date: 05/26/2020 CLINICAL DATA:  69 year old male with altered mental status. EXAM: CT HEAD WITHOUT CONTRAST TECHNIQUE: Contiguous axial images were obtained from the base of the skull through the vertex without intravenous contrast. COMPARISON:  Head CT dated 04/18/2010. FINDINGS: Brain: The ventricles and sulci appropriate size for patient's age. The gray-white matter discrimination is preserved. There is no acute intracranial hemorrhage. No mass effect midline shift. No extra-axial fluid collection. Vascular: No hyperdense vessel or unexpected calcification. Skull: Normal. Negative for fracture or focal lesion. Sinuses/Orbits: No acute finding. Other: None IMPRESSION: Unremarkable noncontrast CT of the brain. Electronically Signed   By: Elgie Collard M.D.   On: 05/26/2020 15:45   CT ANGIO CHEST PE W OR WO CONTRAST  Addendum Date: 05/21/2020   ADDENDUM REPORT: 05/21/2020 19:28 ADDENDUM: Critical Value/emergent results were called by telephone at the time of interpretation on 05/21/2020 at 7:27 pm to provider Dr. Dierdre Highman, who verbally acknowledged these results. Of note the airspace disease in the LEFT lower lobe is progressed from CT same day. Differential remains the same. Electronically Signed   By: Genevive Bi M.D.   On: 05/21/2020 19:28   Result  Date: 05/21/2020 CLINICAL DATA:  PE suspected, high probability.  COVID-19 infection EXAM: CT ANGIOGRAPHY CHEST WITH CONTRAST TECHNIQUE: Multidetector CT imaging of the chest was performed using the standard protocol during bolus administration of intravenous contrast. Multiplanar CT image reconstructions and MIPs were obtained to evaluate the vascular anatomy. CONTRAST:  75mL OMNIPAQUE IOHEXOL 300 MG/ML  SOLN COMPARISON:  None. FINDINGS: Cardiovascular: Large filling defect within the proximal LEFT lower lobe pulmonary artery. Filling defect spans the bifurcation of the LEFT lower lobe and lingular branch pulmonary branches. This this filling defect/thrombus is occlusive to the LEFT lower lobe. No LEFT upper lobe filling defects identified. No filling defect identified within the RIGHT lung pulmonary arteries. No evidence of RIGHT ventricular strain with the RV to LV ratio less than 1. Coronary artery calcification and aortic atherosclerotic calcification. Mediastinum/Nodes: No axillary or supraclavicular adenopathy. No mediastinal or hilar adenopathy. No pericardial fluid. Esophagus normal. Lungs/Pleura: There is diffuse very fine airspace disease within the LEFT lower lobe involving the majority of the LEFT lower lobe. Similar milder findings in the posterior RIGHT lower lobe and lingula. Upper Abdomen: Limited view of the liver, kidneys, pancreas are unremarkable. Normal adrenal glands. Musculoskeletal: No aggressive osseous lesion. Review of the MIP images confirms the above findings. IMPRESSION: 1. Large occlusive pulmonary embolism within the proximal LEFT lower lobe pulmonary artery and lingular pulmonary artery. 2. Diffuse fine airspace disease within the LEFT lower lobe with differential including pulmonary infarction, pulmonary hemorrhage, or pneumonia (including COVID pneumonia). 3. No evidence of RIGHT ventricular strain. 4. Coronary artery calcification and aortic atherosclerotic calcification.  Critical Value/emergent results were called by telephone at the time of interpretation on 05/21/2020 at 6:55 pm to provider [facility] , who verbally acknowledged these results. Electronically Signed: By: Genevive Bi M.D. On: 05/21/2020 18:59   IR IVC FILTER PLMT / S&I Lenise Arena GUID/MOD SED  Result Date: 05/24/2020 INDICATION: 69 year old with recent cardiac arrest likely secondary to pulmonary embolism and COVID-19. Subsequent right ventricular  failure. Patient has clot in the left pulmonary arteries and clot in left femoral vein. There is concern that the patient would not tolerate additional clot burden in the pulmonary arteries and request for IVC filter placement. EXAM: IVC FILTER PLACEMENT; IVC VENOGRAM; ULTRASOUND FOR VASCULAR ACCESS Physician: Rachelle Hora. Lowella Dandy, MD MEDICATIONS: None. ANESTHESIA/SEDATION: Versed 1 mg The patient was continuously monitored during the procedure by the interventional radiology nurse under my direct supervision. CONTRAST:  Carbon dioxide FLUOROSCOPY TIME:  Fluoroscopy Time: 3 minutes, 18 seconds, 18 mGy COMPLICATIONS: None immediate. PROCEDURE: Informed consent was obtained for an IVC filter placement from the patient's daughter. Ultrasound demonstrated a patent right internal jugular vein. Ultrasound images were obtained for documentation. The right neck was prepped and draped in a sterile fashion. Maximal barrier sterile technique was utilized including caps, mask, sterile gowns, sterile gloves, sterile drape, hand hygiene and skin antiseptic. The skin was anesthetized with 1% lidocaine. A 21 gauge needle was directed into the vein with ultrasound guidance and a micropuncture dilator set was placed. A wire was advanced into the IVC. The filter sheath was advanced over the wire into the IVC. An IVC venogram was performed with carbon dioxide. Bilateral renal veins were cannulated using a 5 French catheter and Bentson wire. Bilateral renal veins are identified. Fluoroscopic  images were obtained for documentation. A Bard Denali filter was deployed below the lowest renal vein. Post placement images of the filter were obtained. The vascular sheath was removed with manual compression. Bandage placed over the puncture site. FINDINGS: IVC was patent. Bilateral renal veins were identified. The filter was deployed below the lowest renal vein. IMPRESSION: Successful placement of a retrievable IVC filter. PLAN: This IVC filter is potentially retrievable. The patient will be assessed for filter retrieval by Interventional Radiology in approximately 8-12 weeks. Further recommendations regarding filter retrieval, continued surveillance or declaration of device permanence, will be made at that time. Electronically Signed   By: Richarda Overlie M.D.   On: 05/24/2020 17:57   DG Chest Port 1 View  Result Date: 06/13/2020 CLINICAL DATA:  Shortness of breath, COVID-19 positive. EXAM: PORTABLE CHEST 1 VIEW COMPARISON:  June 11, 2020. FINDINGS: The heart size and mediastinal contours are within normal limits. No pneumothorax is noted. Mild bibasilar subsegmental atelectasis or infiltrates are noted. Small pleural effusions may be present. The visualized skeletal structures are unremarkable. IMPRESSION: Mild bibasilar subsegmental atelectasis or infiltrates are noted with possible small pleural effusions. Electronically Signed   By: Lupita Raider M.D.   On: 06/13/2020 08:06   DG Chest Port 1 View  Result Date: 06/11/2020 CLINICAL DATA:  Shortness of breath. EXAM: PORTABLE CHEST 1 VIEW COMPARISON:  June 10, 2020. FINDINGS: The heart size and mediastinal contours are within normal limits. No pneumothorax is noted. Stable bibasilar opacities are noted concerning for atelectasis or infiltrates with associated pleural effusions, right greater than left. The visualized skeletal structures are unremarkable. IMPRESSION: Stable bibasilar opacities are noted concerning for atelectasis or infiltrates with  associated pleural effusions, right greater than left. Electronically Signed   By: Lupita Raider M.D.   On: 06/11/2020 11:33   DG Chest Port 1 View  Result Date: 06/10/2020 CLINICAL DATA:  Shortness of breath EXAM: PORTABLE CHEST 1 VIEW COMPARISON:  05/26/2020 FINDINGS: Numerous leads and wires project over the chest. Midline trachea. Normal heart size. Suspect layering tiny bilateral pleural effusions. No pneumothorax. Left greater than right base airspace disease is slightly improved, given differences in technique. Suspect underlying mild  pulmonary venous congestion. Removal of left internal jugular line. IMPRESSION: Overall mildly improved aeration. Persistent layering bilateral pleural effusions and slightly improved airspace disease. Suspect developing pulmonary venous congestion. Electronically Signed   By: Jeronimo Greaves M.D.   On: 06/10/2020 15:58   DG CHEST PORT 1 VIEW  Result Date: 05/26/2020 CLINICAL DATA:  Acute respiratory failure.  Hypoxia. EXAM: PORTABLE CHEST 1 VIEW COMPARISON:  05/22/2020. FINDINGS: Interim extubation and removal of NG tube. Left IJ line in stable position. Heart size normal. Diffuse left lung infiltrate. Right base infiltrate. No prominent pleural effusion. No pneumothorax. IMPRESSION: 1. Interim extubation and removal of NG tube. Left IJ line stable position. 2. Diffuse left lung infiltrate and right base infiltrate. Electronically Signed   By: Maisie Fus  Register   On: 05/26/2020 05:50   DG CHEST PORT 1 VIEW  Result Date: 05/22/2020 CLINICAL DATA:  Central line placement, intubation EXAM: PORTABLE CHEST 1 VIEW COMPARISON:  05/21/2020 FINDINGS: Left central line has been placed with the tip in the SVC. No pneumothorax. Endotracheal tube is 5 cm above the carina. Patchy airspace disease throughout the left lung. No focal opacity on the right. Heart is normal size. IMPRESSION: Endotracheal tube 5 cm above the carina. Left central line tip in the SVC. No pneumothorax.  Stable patchy left lung airspace disease. Electronically Signed   By: Charlett Nose M.D.   On: 05/22/2020 21:06   DG Chest Port 1 View  Result Date: 05/21/2020 CLINICAL DATA:  Cough, lower back pain, has not eaten 5 days, emesis EXAM: PORTABLE CHEST 1 VIEW COMPARISON:  Radiograph 04/19/2010, CT 04/18/2010 FINDINGS: Heterogeneous opacities present predominantly along the periphery of the left lung and minimally in the right lung base as well. No pneumothorax or effusion. The aorta is calcified. The remaining cardiomediastinal contours are unremarkable. No acute osseous or soft tissue abnormality. Degenerative changes are present in the imaged spine and shoulders. Telemetry leads overlie the chest. IMPRESSION: Heterogeneous opacities throughout the lungs, greatest in the left lung periphery concerning for pneumonia including potential viral etiology. Aortic Atherosclerosis (ICD10-I70.0). Electronically Signed   By: Kreg Shropshire M.D.   On: 05/21/2020 03:50   DG Abd Portable 1V  Result Date: 05/23/2020 CLINICAL DATA:  Encounter for orogastric tube placement. EXAM: PORTABLE ABDOMEN - 1 VIEW COMPARISON:  06/09/2009 and chest radiograph 05/22/2020 FINDINGS: Gastric tube extends into the abdomen and the tip is in the midline of the lower abdomen, likely in the distal stomach body region. Again noted are patchy densities at the left lung base. Few gas-filled loops of bowel in the abdomen. IMPRESSION: Orogastric tube in the lower abdominal midline and likely in the distal stomach body region. Electronically Signed   By: Richarda Overlie M.D.   On: 05/23/2020 12:17   ECHOCARDIOGRAM COMPLETE  Result Date: 05/23/2020    ECHOCARDIOGRAM REPORT   Patient Name:   GARY GABRIELSEN Date of Exam: 05/23/2020 Medical Rec #:  [id number]      Height:       72.0 in Accession #:    [id number]     Weight:       142.4 lb Date of Birth:  04/07/1951      BSA:          1.844 m Patient Age:    69 years       BP:           112/85 mmHg Patient  Gender: M  HR:           96 bpm. Exam Location:  Inpatient Procedure: 2D Echo, Cardiac Doppler and Color Doppler STAT ECHO Indications:    I26.02 Pulmonary embolus  History:        Patient has no prior history of Echocardiogram examinations.                 Arrythmias:Cardiac Arrest; Signs/Symptoms:Dyspnea, Hypotension                 and Altered Mental Status. Covid 19 positive.  Sonographer:    Sheralyn Boatman RDCS Referring Phys: [id number] Martina Sinner  Sonographer Comments: Technically difficult study due to poor echo windows and echo performed with patient supine and on artificial respirator. IMPRESSIONS  1. Severe RV failure and cor pulmonale.  2. Abnormal septal motion and septal flattening consistent with RV pressure overload. Left ventricular ejection fraction, by estimation, is 50 to 55%. The left ventricle has low normal function. The left ventricle has no regional wall motion abnormalities. Left ventricular diastolic parameters were normal.  3. Right ventricular systolic function is severely reduced. The right ventricular size is severely enlarged. There is mildly elevated pulmonary artery systolic pressure.  4. The mitral valve is normal in structure. No evidence of mitral valve regurgitation. No evidence of mitral stenosis.  5. The aortic valve is normal in structure. Aortic valve regurgitation is not visualized. No aortic stenosis is present.  6. The inferior vena cava is dilated in size with <50% respiratory variability, suggesting right atrial pressure of 15 mmHg. FINDINGS  Left Ventricle: Abnormal septal motion and septal flattening consistent with RV pressure overload. Left ventricular ejection fraction, by estimation, is 50 to 55%. The left ventricle has low normal function. The left ventricle has no regional wall motion abnormalities. The left ventricular internal cavity size was normal in size. There is no left ventricular hypertrophy. Left ventricular diastolic parameters were normal.  Right Ventricle: The right ventricular size is severely enlarged. Right vetricular wall thickness was not assessed. Right ventricular systolic function is severely reduced. There is mildly elevated pulmonary artery systolic pressure. The tricuspid regurgitant velocity is 2.67 m/s, and with an assumed right atrial pressure of 8 mmHg, the estimated right ventricular systolic pressure is 36.5 mmHg. Left Atrium: Left atrial size was normal in size. Right Atrium: Right atrial size was normal in size. Pericardium: There is no evidence of pericardial effusion. Mitral Valve: The mitral valve is normal in structure. No evidence of mitral valve regurgitation. No evidence of mitral valve stenosis. Tricuspid Valve: The tricuspid valve is normal in structure. Tricuspid valve regurgitation is mild . No evidence of tricuspid stenosis. Aortic Valve: The aortic valve is normal in structure. Aortic valve regurgitation is not visualized. No aortic stenosis is present. Pulmonic Valve: The pulmonic valve was normal in structure. Pulmonic valve regurgitation is not visualized. No evidence of pulmonic stenosis. Aorta: The aortic root is normal in size and structure. Venous: The inferior vena cava is dilated in size with less than 50% respiratory variability, suggesting right atrial pressure of 15 mmHg. IAS/Shunts: No atrial level shunt detected by color flow Doppler. Additional Comments: Severe RV failure and cor pulmonale.  LEFT VENTRICLE PLAX 2D LVIDd:         3.70 cm     Diastology LVIDs:         2.80 cm     LV e' medial:    4.46 cm/s LV PW:  1.10 cm     LV E/e' medial:  4.9 LV IVS:        1.10 cm     LV e' lateral:   6.97 cm/s LVOT diam:     2.10 cm     LV E/e' lateral: 3.1 LV SV:         28 LV SV Index:   15 LVOT Area:     3.46 cm  LV Volumes (MOD) LV vol d, MOD A2C: 22.6 ml LV vol d, MOD A4C: 25.5 ml LV vol s, MOD A2C: 11.1 ml LV vol s, MOD A4C: 11.7 ml LV SV MOD A2C:     11.5 ml LV SV MOD A4C:     25.5 ml LV SV MOD BP:       13.7 ml RIGHT VENTRICLE            IVC RV S prime:     8.84 cm/s  IVC diam: 2.50 cm TAPSE (M-mode): 1.3 cm LEFT ATRIUM           Index      RIGHT ATRIUM           Index LA diam:      2.80 cm 1.52 cm/m RA Area:     14.30 cm LA Vol (A4C): 10.5 ml 5.69 ml/m RA Volume:   42.70 ml  23.15 ml/m  AORTIC VALVE LVOT Vmax:   53.80 cm/s LVOT Vmean:  41.900 cm/s LVOT VTI:    0.080 m  AORTA Ao Root diam: 3.60 cm Ao Asc diam:  3.50 cm MITRAL VALVE               TRICUSPID VALVE MV Area (PHT): 3.27 cm    TR Peak grad:   28.5 mmHg MV Decel Time: 232 msec    TR Vmax:        267.00 cm/s MV E velocity: 21.94 cm/s MV A velocity: 41.30 cm/s  SHUNTS MV E/A ratio:  0.53        Systemic VTI:  0.08 m                            Systemic Diam: 2.10 cm Charlton Haws MD Electronically signed by Charlton Haws MD Signature Date/Time: 05/23/2020/10:46:38 AM    Final    CT RENAL STONE STUDY  Result Date: 05/21/2020 CLINICAL DATA:  69 year old male with low back pain, flank pain, decreased p.o. EXAM: CT ABDOMEN AND PELVIS WITHOUT CONTRAST TECHNIQUE: Multidetector CT imaging of the abdomen and pelvis was performed following the standard protocol without IV contrast. COMPARISON:  Noncontrast CT Abdomen and Pelvis 10/28/2008. Portable chest radiograph 04/19/2010. FINDINGS: Lower chest: Patchy, irregular peribronchial and peripheral scattered pulmonary opacity at both lung bases. This seems to be new from last month. Underlying probable pulmonary hyperinflation. Some associated lung base bronchiectasis. No cavitating areas identified. No cardiomegaly, pericardial effusion or pleural effusion. Hepatobiliary: Negative noncontrast liver and gallbladder. Pancreas: Negative. Spleen: Negative. Adrenals/Urinary Tract: Bulky left nephrolithiasis measuring 13 mm at the renal pelvis. Additional left lower pole stone. No hydronephrosis. Negative noncontrast right kidney. No hydroureter. Unremarkable urinary bladder. Incidental pelvic phleboliths.  Stomach/Bowel: Decompressed large bowel. There is long segment circumferential wall thickening in the right colon, up to the 12 mm series 4, image 49) with some areas of adjacent mesenteric stranding (coronal image 43). The wall thickening gradually decreases through the transverse colon. No free air. No free fluid. The terminal ileum appear spared. Appendix  seems to remain normal on coronal image 32. No dilated small bowel.  Decompressed stomach and duodenum. Vascular/Lymphatic: Normal caliber abdominal aorta. Mild calcified atherosclerosis. Vascular patency is not evaluated in the absence of IV contrast. No lymphadenopathy identified in the absence of contrast. Reproductive: Negative. Other: Previous lower abdominal ventral hernia repair with mesh. No pelvic free fluid. Musculoskeletal: Degenerative changes in the spine. No acute osseous abnormality identified. IMPRESSION: 1. In the abdomen there is circumferential bowel wall thickening and mesenteric stranding involving the right colon. Wall thickening gradually abates through the transverse colon. This is compatible with Acute Nonspecific Colitis. 2. But the lung bases are also abnormal with patchy and irregular peribronchial and peripheral pulmonary opacity suspicious for acute viral/atypical respiratory infection. No areas of cavitation to suggest septic emboli. No pleural effusion. 3. Bulky left nephrolithiasis, but no obstructive uropathy. 4. Previous lower abdominal hernia repair with mesh. Electronically Signed   By: Odessa Fleming M.D.   On: 05/21/2020 02:46   VAS [place] LOWER EXTREMITY VENOUS (DVT)  Result Date: 05/24/2020  Lower Venous DVT Study Indications: Covid-19, pulmonary embolism.  Risk Factors: Confirmed PE. Anticoagulation: Heparin. Limitations: Body habitus, ventilation, did not perform compressions in thigh and popliteal fossa of the left lower extremity secondary to mobile thrombus, bandages and line. Comparison Study: No prior study Performing  Technologist: Sherren Kerns RVS  Examination Guidelines: A complete evaluation includes B-mode imaging, spectral Doppler, color Doppler, and power Doppler as needed of all accessible portions of each vessel. Bilateral testing is considered an integral part of a complete examination. Limited examinations for reoccurring indications may be performed as noted. The reflux portion of the exam is performed with the patient in reverse Trendelenburg.  +---------+---------------+---------+-----------+----------+--------------+ RIGHT    CompressibilityPhasicitySpontaneityPropertiesThrombus Aging +---------+---------------+---------+-----------+----------+--------------+ CFV      Full           Yes      No                                  +---------+---------------+---------+-----------+----------+--------------+ SFJ      Full                                                        +---------+---------------+---------+-----------+----------+--------------+ FV Prox  Full                                                        +---------+---------------+---------+-----------+----------+--------------+ FV Mid   Full                                                        +---------+---------------+---------+-----------+----------+--------------+ FV DistalFull                                                        +---------+---------------+---------+-----------+----------+--------------+  PFV      Full                                                        +---------+---------------+---------+-----------+----------+--------------+ POP      Full           Yes      No                                  +---------+---------------+---------+-----------+----------+--------------+ PTV      Full                                                        +---------+---------------+---------+-----------+----------+--------------+ PERO     Full                                                         +---------+---------------+---------+-----------+----------+--------------+   +---------+---------------+---------+-----------+----------+-------------------+ LEFT     CompressibilityPhasicitySpontaneityPropertiesThrombus Aging      +---------+---------------+---------+-----------+----------+-------------------+ CFV                                                   not visualized                                                            secondary to                                                              bandage/line        +---------+---------------+---------+-----------+----------+-------------------+ SFJ                                                   not visualized                                                            secondary to  bandage/line        +---------+---------------+---------+-----------+----------+-------------------+ FV Prox  Full                                         patent by color     +---------+---------------+---------+-----------+----------+-------------------+ FV Mid                                                patent by color     +---------+---------------+---------+-----------+----------+-------------------+ FV Distal                                             patent by color     +---------+---------------+---------+-----------+----------+-------------------+ PFV                                                   patent by color     +---------+---------------+---------+-----------+----------+-------------------+ POP                                                   patent by color     +---------+---------------+---------+-----------+----------+-------------------+ PTV      Full                                                              +---------+---------------+---------+-----------+----------+-------------------+ PERO     Full                                                             +---------+---------------+---------+-----------+----------+-------------------+     Summary: RIGHT: - There is no evidence of deep vein thrombosis in the lower extremity.  vessels are dilated with significantly sluggish flow, throughout  LEFT: - Findings consistent with acute deep vein thrombosis involving the left femoral vein. - Acute, mobile thrombus noted in the proximal and mid femoral vein. Vessels are dilated with significantly sluggish flow, throughout.  *See table(s) above for measurements and observations. Electronically signed by Waverly Ferrari MD on 05/24/2020 at 6:21:22 PM.    Final

## 2020-06-13 NOTE — TOC Progression Note (Signed)
Transition of Care [facility]) - Progression Note    Patient Details  Name: [name] MRN: [id number] Date of Birth: 1951-05-08  Transition of Care [facility]) CM/SW Contact  Lorri Frederick, LCSW Phone Number: 06/13/2020, 8:56 AM  Clinical Narrative:   0855: Dwayne Barnes.  Insurance auth not back, will call if she receives it today.     Expected Discharge Plan: Skilled Nursing Facility Barriers to Discharge: Barriers Resolved  Expected Discharge Plan and Services Expected Discharge Plan: Skilled Nursing Facility In-house Referral: Clinical Social Work Discharge Planning Services: CM Consult Post Acute Care Choice: Skilled Nursing Facility Living arrangements for the past 2 months: Single Family Home Expected Discharge Date: 06/10/20                                     Social Determinants of Health (SDOH) Interventions    Readmission Risk Interventions No flowsheet data found.

## 2020-06-14 LAB — COMPREHENSIVE METABOLIC PANEL
ALT: 68 U/L — ABNORMAL HIGH (ref 0–44)
AST: 42 U/L — ABNORMAL HIGH (ref 15–41)
Albumin: 1.5 g/dL — ABNORMAL LOW (ref 3.5–5.0)
Alkaline Phosphatase: 255 U/L — ABNORMAL HIGH (ref 38–126)
Anion gap: 9 (ref 5–15)
BUN: 10 mg/dL (ref 8–23)
CO2: 27 mmol/L (ref 22–32)
Calcium: 8.4 mg/dL — ABNORMAL LOW (ref 8.9–10.3)
Chloride: 96 mmol/L — ABNORMAL LOW (ref 98–111)
Creatinine, Ser: 0.86 mg/dL (ref 0.61–1.24)
GFR, Estimated: >60 mL/min (ref >60)
Glucose, Bld: 105 mg/dL — ABNORMAL HIGH (ref 70–99)
Potassium: 4.4 mmol/L (ref 3.5–5.1)
Sodium: 132 mmol/L — ABNORMAL LOW (ref 135–145)
Total Bilirubin: 0.7 mg/dL (ref 0.3–1.2)
Total Protein: 5.3 g/dL — ABNORMAL LOW (ref 6.5–8.1)

## 2020-06-14 LAB — CBC WITH DIFFERENTIAL/PLATELET
Abs Immature Granulocytes: 0.2 10*3/uL — ABNORMAL HIGH (ref 0.00–0.07)
Basophils Absolute: 0.1 10*3/uL (ref 0.0–0.1)
Basophils Relative: 1 %
Eosinophils Absolute: 0.1 10*3/uL (ref 0.0–0.5)
Eosinophils Relative: 1 %
HCT: 26.2 % — ABNORMAL LOW (ref 39.0–52.0)
Hemoglobin: 8.6 g/dL — ABNORMAL LOW (ref 13.0–17.0)
Immature Granulocytes: 2 %
Lymphocytes Relative: 10 %
Lymphs Abs: 1 10*3/uL (ref 0.7–4.0)
MCH: 28.6 pg (ref 26.0–34.0)
MCHC: 32.8 g/dL (ref 30.0–36.0)
MCV: 87 fL (ref 80.0–100.0)
Monocytes Absolute: 0.6 10*3/uL (ref 0.1–1.0)
Monocytes Relative: 6 %
Neutro Abs: 8 10*3/uL — ABNORMAL HIGH (ref 1.7–7.7)
Neutrophils Relative %: 80 %
Platelets: 651 10*3/uL — ABNORMAL HIGH (ref 150–400)
RBC: 3.01 MIL/uL — ABNORMAL LOW (ref 4.22–5.81)
RDW: 14.6 % (ref 11.5–15.5)
WBC: 10 10*3/uL (ref 4.0–10.5)
nRBC: 0 % (ref 0.0–0.2)

## 2020-06-14 LAB — C-REACTIVE PROTEIN: CRP: 17.9 mg/dL — ABNORMAL HIGH (ref <1.0)

## 2020-06-14 LAB — D-DIMER, QUANTITATIVE: D-Dimer, Quant: 3.67 ug/mL-FEU — ABNORMAL HIGH (ref 0.00–0.50)

## 2020-06-14 LAB — BRAIN NATRIURETIC PEPTIDE: B Natriuretic Peptide: 53.6 pg/mL (ref 0.0–100.0)

## 2020-06-14 LAB — PROCALCITONIN: Procalcitonin: 0.1 ng/mL

## 2020-06-14 LAB — MAGNESIUM: Magnesium: 2 mg/dL (ref 1.7–2.4)

## 2020-06-14 MED ORDER — VANCOMYCIN 50 MG/ML ORAL SOLUTION: 125.0000 mg | Freq: Four times a day (QID) | ORAL | 0 refills | Status: DC

## 2020-06-14 NOTE — TOC Progression Note (Signed)
Transition of Care [facility]) - Progression Note    Patient Details  Name: QUINDON DENKER MRN: [id number] Date of Birth: February 04, 1951  [facility]) CM/SW Contact  Aino Heckert, [place], [place] Phone Number: 06/14/2020, 10:29 AM  Clinical Narrative:    Phone call to [facility] to follow up on patient's authorization. Revonda Standard checking and will call this social worker back when information is received.   [address] LCSW Transition of Care [phone]    Expected Discharge Plan: Skilled Nursing Facility Barriers to Discharge: Barriers Resolved  Expected Discharge Plan and Services Expected Discharge Plan: Skilled Nursing Facility In-house Referral: Clinical Social Work Discharge Planning Services: CM Consult Post Acute Care Choice: Skilled Nursing Facility Living arrangements for the past 2 months: Single Family Home Expected Discharge Date: 06/14/20                                     Social Determinants of Health (SDOH) Interventions    Readmission Risk Interventions No flowsheet data found.

## 2020-06-15 LAB — TYPE AND SCREEN
ABO/RH(D): A POS
Antibody Screen: NEGATIVE

## 2020-06-15 LAB — CULTURE, BLOOD (ROUTINE X 2)
Culture: NO GROWTH
Culture: NO GROWTH
Special Requests: ADEQUATE
Special Requests: ADEQUATE

## 2020-06-15 LAB — PROCALCITONIN: Procalcitonin: <0.1 ng/mL

## 2020-06-15 LAB — CBC
HCT: 30.5 % — ABNORMAL LOW (ref 39.0–52.0)
Hemoglobin: 9.5 g/dL — ABNORMAL LOW (ref 13.0–17.0)
MCH: 27.5 pg (ref 26.0–34.0)
MCHC: 31.1 g/dL (ref 30.0–36.0)
MCV: 88.2 fL (ref 80.0–100.0)
Platelets: 808 10*3/uL — ABNORMAL HIGH (ref 150–400)
RBC: 3.46 MIL/uL — ABNORMAL LOW (ref 4.22–5.81)
RDW: 14.6 % (ref 11.5–15.5)
WBC: 12.4 10*3/uL — ABNORMAL HIGH (ref 4.0–10.5)
nRBC: 0 % (ref 0.0–0.2)

## 2020-06-15 NOTE — Progress Notes (Signed)
[facility]                                                                                                                                                                         Patient Demographics  Dwayne Barnes, is a 69 y.o. male  RDE:[id number]  UDJ:[id number]  DOB - 1950/12/11  Admit date - 05/20/2020  Admitting Physician Jonah Blue, MD  Outpatient Primary MD for the patient is Shelbie Proctor  LOS - 25   Chief Complaint  Patient presents with  . Fatigue        Assessment & Plan    Patient seen briefly today due for discharge soon per Discharge done 06/14/20 by me, no further issues, Vital signs stable, patient feels fine.  Await bed.    Medications  Scheduled Meds: . apixaban  5 mg Oral BID  . famotidine  20 mg Oral Daily  . feeding supplement  237 mL Oral TID BM  . mupirocin ointment  1 application Nasal BID  . sodium chloride flush  10-40 mL Intracatheter Q12H  . vancomycin  125 mg Oral Q6H   Continuous Infusions: PRN Meds:.acetaminophen (TYLENOL) oral liquid 160 mg/5 mL, [DISCONTINUED] ondansetron **OR** ondansetron (ZOFRAN) IV    Time Spent in minutes   10 minutes   Susa Raring M.D on 06/15/2020 at 10:34 AM  Between 7am to 7pm - Pager - [phone]  After 7pm go to [email or web address] - password [email or web address]  And look for the night coverage person covering for me after hours  [facility] Office  [phone]    Subjective:   [name] today has, No headache, No chest pain, No abdominal pain - No Nausea, No new weakness tingling or numbness, No Cough - SOB.    Objective:   Vitals:   06/14/20 2056 06/15/20 0455 06/15/20 0500 06/15/20 0814  BP: 121/76 119/75  115/70  Pulse: 86 81  80  Resp: (!) 23 19  16   Temp: 99 F (37.2 C) 98.1 F (36.7 C)  98.1 F (36.7 C)  TempSrc: Oral Oral  Oral  SpO2: 97% 94%  90%  Weight:   58.3 kg   Height:         Wt Readings from Last 3 Encounters:  06/15/20 58.3 kg  12/20/14 66 kg     Intake/Output Summary (Last 24 hours) at 06/15/2020 1034 Last data filed at 06/15/2020 0940 Gross per 24 hour  Intake 840 ml  Output 2025 ml  Net -1185 ml    Exam Awake Alert, Oriented X 3, No new F.N deficits, Normal affect [place].AT,PERRAL Supple Neck,No JVD, No cervical lymphadenopathy appriciated.  Symmetrical Chest wall movement, Good air movement bilaterally, CTAB RRR,No Gallops,Rubs or new Murmurs, No Parasternal  Heave +ve B.Sounds, Abd Soft, Non tender, No organomegaly appriciated, No rebound - guarding or rigidity. No Cyanosis, Clubbing or edema, No new Rash or bruise      Data Review

## 2020-06-15 NOTE — Progress Notes (Signed)
PT Cancellation Note  Patient Details Name: CLEARANCE CHENAULT MRN: [id number] DOB: [birth year]   Cancelled Treatment:    Reason Eval/Treat Not Completed: Patient at procedure or test/unavailable  Patient currently bathing with NT assist. Noted insurance awaiting PT note. Will return ASAP to work with pt.   Jerolyn Center, PT Pager [phone]   Zena Amos 06/15/2020, 9:39 AM

## 2020-06-15 NOTE — TOC Progression Note (Addendum)
Transition of Care [facility]) - Progression Note    Patient Details  Name: Dwayne Barnes MRN: [id number] Date of Birth: 1950-12-14  Transition of Care [facility]) CM/SW Contact  Mearl Latin, LCSW Phone Number: 06/15/2020, 9:19 AM  Clinical Narrative:    9am-Insurance requesting updated therapy notes; Patient is on the schedule for PT/OT today.  3:30pm-[facility] has received insurance approval. MD aware and will see if patient is medically ready for discharge tomorrow d/t bloody stool.    Expected Discharge Plan: Skilled Nursing Facility Barriers to Discharge: Barriers Resolved  Expected Discharge Plan and Services Expected Discharge Plan: Skilled Nursing Facility In-house Referral: Clinical Social Work Discharge Planning Services: CM Consult Post Acute Care Choice: Skilled Nursing Facility Living arrangements for the past 2 months: Single Family Home Expected Discharge Date: 06/14/20                                     Social Determinants of Health (SDOH) Interventions    Readmission Risk Interventions No flowsheet data found.

## 2020-06-15 NOTE — Care Management Important Message (Signed)
Important Message  Patient Details  Name: Dwayne Barnes MRN: [id number] Date of Birth: 11/24/1950   Medicare Important Message Given:  Yes - Important Message mailed due to current National Emergency   Verbal consent obtained due to current National Emergency  Relationship to patient: Self Contact Name: Irby Fails Call Date: 06/15/20  Time: 1503 Phone: [phone] Outcome: No Answer/Busy Important Message mailed to: Patient address on file    Orson Aloe 06/15/2020, 3:04 PM

## 2020-06-15 NOTE — Progress Notes (Signed)
Physical Therapy Treatment Patient Details Name: Dwayne Barnes MRN: [id number] DOB: 1951-02-18 Today's Date: 06/15/2020    History of Present Illness Pt is a 69 y.o. male admitted 05/20/20 with back pain, weakness and colitis; found to have (+) COVID-19 and LLL PE. Pt with 2x cardiac arrests on 11/19 and intubated; self-exubated 11/22. 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 Head CT 11/23 unremarkable. S/p femoral line removal 11/24. Course complicated by progressive diarrhea; abdominal CT 11/29 with progressive colitis. PMH includes HOH.    PT Comments    Patient with difficulty hearing this therapist and does better with written communication by PT. Patient able to provide all home set-up and prior functional status information. He normally lives alone, however states he will go stay with his daughter until he is strong enough to return home. He agrees he needs more therapy/rehab prior to going to his daughter's home. He continues with increased work of breathing after 20 ft (today required seated rest break), however pt's saturations (pleth on earlobe with good waveform) remained in 90s and HR max 100s. He denies feeling short of breath, however he is clearly working hard. Progressed to walking 40 ft with min assist for balance. Pt is very motivated and will continue to make good progress.     Follow Up Recommendations  SNF;Supervision for mobility/OOB     Equipment Recommendations  3in1 (PT);Rolling walker with 5" wheels    Recommendations for Other Services       Precautions / Restrictions Precautions Precautions: Fall Precaution Comments: Extremely HOH (did well with reading large font) Restrictions Weight Bearing Restrictions: No    Mobility  Bed Mobility                  Transfers Overall transfer level: Needs assistance Equipment used: None Transfers: Sit to/from Stand;Stand Pivot Transfers Sit to Stand: Min guard         General transfer  comment: no lifting assist needed; x 3 reps  Ambulation/Gait Ambulation/Gait assistance: Min assist Gait Distance (Feet): 20 Feet (seated rest, 40) Assistive device: None Gait Pattern/deviations: Step-through pattern;Decreased stride length;Wide base of support Gait velocity: decr   General Gait Details: pt reaching for UE support as walking in hospital room; limited distance due to labored breathing (although pt denies feeling short of breath, sats 98-100%, HR 90s)   Stairs             Wheelchair Mobility    Modified Rankin (Stroke Patients Only)       Balance Overall balance assessment: Needs assistance Sitting-balance support: Single extremity supported Sitting balance-Leahy Scale: Fair     Standing balance support: Single extremity supported Standing balance-Leahy Scale: Poor Standing balance comment: Reliant on external assist and at least single UE support to maintain balance; after standing without RW he did not feel safe to try walking and returned to sitting                            Cognition Arousal/Alertness: Awake/alert Behavior During Therapy: WFL for tasks assessed/performed Overall Cognitive Status: Within Functional Limits for tasks assessed                                        Exercises      General Comments General comments (skin integrity, edema, etc.): incr time due to [facility]; does  well with reading written instructions in large print      Pertinent Vitals/Pain Pain Assessment: Faces Faces Pain Scale: No hurt    Home Living Family/patient expects to be discharged to:: Private residence Living Arrangements: Alone Available Help at Discharge: Family;Available 24 hours/day (per pt, daughter, grandaughter, dtr's friend) Type of Home: Mobile home Home Access: Stairs to enter Entrance Stairs-Rails: None Home Layout: One level Home Equipment: None      Prior Function Level of Independence: Independent       Comments: Drives, gets his own groceries, does his own yard work   PT Goals (current goals can now be found in the care plan section) Acute Rehab PT Goals Patient Stated Goal: go home with his daughter, then return to his home alone Time For Goal Achievement: 06/29/20 Potential to Achieve Goals: Good Progress towards PT goals: Progressing toward goals    Frequency    Min 2X/week      PT Plan Current plan remains appropriate    Co-evaluation PT/OT/SLP Co-Evaluation/Treatment: Yes            AM-PAC PT "6 Clicks" Mobility   Outcome Measure  Help needed turning from your back to your side while in a flat bed without using bedrails?: A Little Help needed moving from lying on your back to sitting on the side of a flat bed without using bedrails?: A Little Help needed moving to and from a bed to a chair (including a wheelchair)?: A Little Help needed standing up from a chair using your arms (e.g., wheelchair or bedside chair)?: A Little Help needed to walk in hospital room?: A Little Help needed climbing 3-5 steps with a railing? : A Lot 6 Click Score: 17    End of Session Equipment Utilized During Treatment: Gait belt Activity Tolerance: Patient limited by fatigue Patient left: in chair;with call bell/phone within reach;with chair alarm set Nurse Communication: Mobility status;Other (comment) (BM with blood clotted appearance) PT Visit Diagnosis: Muscle weakness (generalized) (M62.81);Difficulty in walking, not elsewhere classified (R26.2)     Time: 1194-1740 PT Time Calculation (min) (ACUTE ONLY): 48 min  Charges:  $Gait Training: 8-22 mins $Therapeutic Exercise: 8-22 mins $Therapeutic Activity: 8-22 mins                      Dwayne Barnes, PT Pager [phone]    Zena Amos 06/15/2020, 12:25 PM

## 2020-06-16 DIAGNOSIS — K921 Melena: Secondary | ICD-10-CM

## 2020-06-16 DIAGNOSIS — A0472 Enterocolitis due to Clostridium difficile, not specified as recurrent: Secondary | ICD-10-CM

## 2020-06-16 LAB — CBC
HCT: 26.9 % — ABNORMAL LOW (ref 39.0–52.0)
HCT: 27.7 % — ABNORMAL LOW (ref 39.0–52.0)
HCT: 26.3 % — ABNORMAL LOW (ref 39.0–52.0)
Hemoglobin: 8.4 g/dL — ABNORMAL LOW (ref 13.0–17.0)
Hemoglobin: 8.6 g/dL — ABNORMAL LOW (ref 13.0–17.0)
Hemoglobin: 8.2 g/dL — ABNORMAL LOW (ref 13.0–17.0)
MCH: 27.5 pg (ref 26.0–34.0)
MCH: 27.4 pg (ref 26.0–34.0)
MCH: 27.4 pg (ref 26.0–34.0)
MCHC: 31.2 g/dL (ref 30.0–36.0)
MCHC: 31 g/dL (ref 30.0–36.0)
MCHC: 31.2 g/dL (ref 30.0–36.0)
MCV: 87.9 fL (ref 80.0–100.0)
MCV: 88.2 fL (ref 80.0–100.0)
MCV: 88 fL (ref 80.0–100.0)
Platelets: 673 10*3/uL — ABNORMAL HIGH (ref 150–400)
Platelets: 676 10*3/uL — ABNORMAL HIGH (ref 150–400)
Platelets: 667 K/uL — ABNORMAL HIGH (ref 150–400)
RBC: 3.06 MIL/uL — ABNORMAL LOW (ref 4.22–5.81)
RBC: 3.14 MIL/uL — ABNORMAL LOW (ref 4.22–5.81)
RBC: 2.99 MIL/uL — ABNORMAL LOW (ref 4.22–5.81)
RDW: 14.7 % (ref 11.5–15.5)
RDW: 14.6 % (ref 11.5–15.5)
RDW: 14.7 % (ref 11.5–15.5)
WBC: 9.8 10*3/uL (ref 4.0–10.5)
WBC: 10 10*3/uL (ref 4.0–10.5)
WBC: 10.4 K/uL (ref 4.0–10.5)
nRBC: 0 % (ref 0.0–0.2)
nRBC: 0 % (ref 0.0–0.2)
nRBC: 0 % (ref 0.0–0.2)

## 2020-06-16 LAB — BASIC METABOLIC PANEL WITH GFR
Anion gap: 11 (ref 5–15)
BUN: 7 mg/dL — ABNORMAL LOW (ref 8–23)
CO2: 27 mmol/L (ref 22–32)
Calcium: 8.5 mg/dL — ABNORMAL LOW (ref 8.9–10.3)
Chloride: 96 mmol/L — ABNORMAL LOW (ref 98–111)
Creatinine, Ser: 0.88 mg/dL (ref 0.61–1.24)
GFR, Estimated: >60 mL/min (ref >60)
Glucose, Bld: 96 mg/dL (ref 70–99)
Potassium: 4.5 mmol/L (ref 3.5–5.1)
Sodium: 134 mmol/L — ABNORMAL LOW (ref 135–145)

## 2020-06-16 LAB — HEPATITIS A ANTIBODY, TOTAL: hep A Total Ab: REACTIVE — AB

## 2020-06-16 MED ORDER — PSYLLIUM 95 % PO PACK
1.0000 | PACK | Freq: Every day | ORAL | Status: DC
  Administered 2020-06-16 – 2020-06-22 (×5): 1 via ORAL
  Filled 2020-06-16 (×13): qty 1

## 2020-06-16 MED ORDER — HYDROCORTISONE ACETATE 25 MG RE SUPP
25.0000 mg | Freq: Two times a day (BID) | RECTAL | Status: DC
  Administered 2020-06-16 – 2020-06-25 (×16): 25 mg via RECTAL
  Filled 2020-06-16 (×25): qty 1

## 2020-06-16 MED ORDER — FAMOTIDINE 20 MG PO TABS: 20.0000 mg | ORAL_TABLET | Freq: Every day | ORAL | Status: AC

## 2020-06-16 NOTE — Progress Notes (Signed)
Occupational Therapy Treatment Patient Details Name: Dwayne Barnes MRN: [id number] DOB: 03-08-1951 Today's Date: 06/16/2020    History of present illness Pt is a 69 y.o. male admitted 05/20/20 with back pain, weakness and colitis; found to have (+) COVID-19 and LLL PE. Pt with 2x cardiac arrests on 11/19 and intubated; self-exubated 11/22. 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 Head CT 11/23 unremarkable. S/p femoral line removal 11/24. Course complicated by progressive diarrhea; abdominal CT 11/29 with progressive colitis. PMH includes HOH.   OT comments  Attempted OOB activity and ambulation around room. Pt with multiple BMs and required increased assistance with pericare after BM episodes. Pt making progress however fatigues easily. Goals updated. Recommend rehab at SNF.   Follow Up Recommendations  SNF    Equipment Recommendations  3 in 1 bedside commode    Recommendations for Other Services      Precautions / Restrictions Precautions Precautions: Fall Precaution Comments: Extremely HOH       Mobility Bed Mobility Overal bed mobility: Needs Assistance       Supine to sit: Supervision;HOB elevated Sit to supine: Supervision      Transfers Overall transfer level: Needs assistance Equipment used: Rolling walker (2 wheeled) Transfers: Sit to/from Stand Sit to Stand: Min assist              Balance     Sitting balance-Leahy Scale: Fair       Standing balance-Leahy Scale: Poor                             ADL either performed or assessed with clinical judgement   ADL       Grooming: Set up;Sitting (sitting EOB)                   Toilet Transfer: Minimal assistance;BSC;RW;Stand-pivot   Toileting- Clothing Manipulation and Hygiene: Total assistance Toileting - Clothing Manipulation Details (indicate cue type and reason): Pt did not feel safe releasing RW to assist with pericare     Functional mobility during ADLs:  Minimal assistance;Rolling walker;Cueing for safety (vc for hand placement)       Vision       Perception     Praxis      Cognition Arousal/Alertness: Awake/alert Behavior During Therapy: WFL for tasks assessed/performed Overall Cognitive Status: Within Functional Limits for tasks assessed                                          Exercises     Shoulder Instructions       General Comments      Pertinent Vitals/ Pain       Pain Assessment: Faces Faces Pain Scale: Hurts a little bit Pain Location: abdominal area Pain Descriptors / Indicators: Discomfort Pain Intervention(s): Limited activity within patient's tolerance  Home Living                                          Prior Functioning/Environment              Frequency  Min 2X/week        Progress Toward Goals  OT Goals(current goals can now be found in the care plan section)  Progress  towards OT goals: Progressing toward goals  Acute Rehab OT Goals Patient Stated Goal: to get stronger OT Goal Formulation: With patient Time For Goal Achievement: 06/30/20 Potential to Achieve Goals: Good ADL Goals Pt Will Perform Grooming: with modified independence;standing Pt Will Perform Lower Body Bathing: with modified independence;sit to/from stand Pt Will Perform Lower Body Dressing: with modified independence;sit to/from stand Pt Will Transfer to Toilet: with modified independence;ambulating Additional ADL Goal #1: Pt will complete x3 standing ADL activities with min A  Plan Discharge plan remains appropriate    Co-evaluation                 AM-PAC OT "6 Clicks" Daily Activity     Outcome Measure   Help from another person eating meals?: A Little Help from another person taking care of personal grooming?: A Little Help from another person toileting, which includes using toliet, bedpan, or urinal?: A Lot Help from another person bathing (including washing,  rinsing, drying)?: A Lot Help from another person to put on and taking off regular upper body clothing?: A Little Help from another person to put on and taking off regular lower body clothing?: A Lot 6 Click Score: 15    End of Session Equipment Utilized During Treatment: Rolling walker  OT Visit Diagnosis: Unsteadiness on feet (R26.81);Other abnormalities of gait and mobility (R26.89);Muscle weakness (generalized) (M62.81)   Activity Tolerance Patient tolerated treatment well   Patient Left in bed;with call bell/phone within reach;with bed alarm set   Nurse Communication Mobility status;Other (comment) (several BMs)        Time: 6384-6659 OT Time Calculation (min): 31 min  Charges: OT General Charges $OT Visit: 1 Visit OT Treatments $Self Care/Home Management : 23-37 mins  Luisa Dago, OT/L   Acute OT Clinical Specialist Acute Rehabilitation Services Pager [phone] Office [phone]    [facility] 06/16/2020, 4:05 PM

## 2020-06-16 NOTE — Progress Notes (Addendum)
PROGRESS NOTE                                                                                                                                                                                                             Patient Demographics:    Dwayne Barnes, is a 69 y.o. male, DOB - 27-Oct-1950, ZOX:[id number]  Outpatient Primary MD for the patient is [name], PA-C   Admit date - 05/20/2020   LOS - 26  Chief Complaint  Patient presents with  . Fatigue       Brief Narrative: Patient is a 69 y.o. male with no significant past medical history-presented to the ED on 11/17 with nausea/vomiting, weakness and back pain-further evaluation revealed COVID-19 infection and a large PE.  Patient was started on IV heparin infusion along with steroid/Remdesivir-on 11/19-patient had a cardiac arrest-was intubated during the code-total downtime of around 6 minutes before ROSC.  Patient was subsequently transferred to the ICU-unfortunately soon after arrival in the ICU-he had another cardiac arrest-and 4 rounds of CPR were provided with ROSC.  Patient was provided supportive care-he subsequently improved-self extubated on 11/23-he was [facility] on 11/25.  Further hospital course complicated by diarrhea secondary to C. difficile along with AKI and hyponatremia.See below for further details.   COVID-19 vaccinated status: Vaccinated  Significant Events: 11/17>> Admit to [facility] for PE/COVID-19 infection 11/19>> PEA cardiac arrest x 2-given TPA-intubated-transfer to ICU 11/23>> Self extubated 11/25>> transfer to [facility] 11/29>> worsening diarrhea-CT abdomen with progressive colitis  Significant studies: 11/18>>CT renal stone: non specific colitis, GGO's, left nephrolithiasis without obstructive uropathy. 11/18>>CTA chest: large PC in LLL, GGO's LLL. 11/20 echo>> EF 50-55%, RV systolic function severely  reduced. 11/21>> B/L extremity Doppler: acute DVTin left femoral vein, acute mobile thrombus in the proximal femoral vein. 11/23>>CT Head: unremarkable   11/29>> CT abdomen/pelvis>> progressive colitis  COVID-19 medications: Steroids: 11/17>>11/26 Remdesivir: 11/17>> 11/22  Antibiotics: Oral vancomycin: 11/30>>  Microbiology data: 11/18>> urine culture: Insignificant growth 11/30>> stool C. difficile: +ve 11/30>> GI pathogen panel: negative  Procedures: ETT>>11/19 - 11/23 IVC filter placement by IR>>11/21.  Consults: PCCM, IR, GI  DVT prophylaxis: Therapeutic Lovenox>> Eliquis    Subjective:   Patient in bed, appears comfortable, denies any headache, no fever, no chest pain or pressure, no shortness of breath , no abdominal pain.  No focal weakness.    Assessment  & Plan :   Cardiac arrest due to large PE + DVT : S/p TPA-was on therapeutic Lovenox >> Eliquis >> Heparin, has IVC filter. PE likely provoked by COVID-19 infection.  PCCM MD discussed with Dr. Illa Level indication for any acute intervention.  Continue telemetry monitoring.  Echo as above.  Fever evening of 06/10/2020.  Clinically appears to have aspirated, MRSA nasal PCR positive, UA also positive, follow blood and urine cultures, trend procalcitonin, given 1 dose of IV vancomycin and 1 dose of oral Keflex on 06/10/2020.  Due to his underlying severe C. difficile trying to avoid antibiotics unless absolutely needed.  Speech is following, cultures negative thus far, holding off further antibiotics and clinically monitoring, fever has resolved.  Acute hypoxic respiratory failure: Due to PE/cardiac arrest/COVID-19 pneumonia-briefly intubated now resolved.Marland Kitchen  COVID-19 pneumonia: Completed a course of Remdesivir-an steroids.  Hypoxia has resolved.Out of quarantine.  Bright red blood per rectum started on 06/15/2020.  On Pepcid, avoiding PPI due to recent C. difficile.  Was on anticoagulation for PE, hold all  anticoagulation on 06/16/2020, has IVC filter, monitor H&H, type screen done.  No nausea or elevation in BUN.  Likely either internal hemorrhoid or diverticular bleed.  Will involve GI.  Patient and family agreeable for transfusion if needed.  Daughter informed.    C. difficile colitis: Diarrhea improving-leukocytosis/AKI all getting better.  Continue oral vancomycin stop 06/20/20.  Normocytic anemia: Secondary to critical illness-stable for monitoring-no indication for PRBC transfusion.  Acute metabolic encephalopathy/ICU delirium/possible anoxic injury: Mental status now back to baseline, he is extremely hard of hearing, head CT nonacute.    Dysphagia: In the setting of cardiac arrest-seen by SLP-required NG tube-subsequently seen by SLP-diet gradually upgraded to a dysphagia 3 diet.  AKI: Likely hemodynamically mediated-due to diarrhea-has resolved with IV fluids and improvement in diarrhea.  Transaminitis: Likely secondary to COVID-19-downtrending.  Deconditioning/debility: Secondary to critical illness- PT/OT eval completed-recommendations are for SNF, SNF does not want him at the SNF till his stools are formed and C. difficile has clinically resolved.  Monitoring closely.  Hard of hearing - supportive care.  Hyponatremia.  Persistent.  Became hypotensive 06/08/2020 hence IV fluids, other electrolytes including low serum uric acid point more towards SIADH but due to hypotension requires fluids.  Will monitor.  Also drinks more than 5-6 bottles of soda every day, requested to cut down.   Recent Labs  Lab 06/10/20 1548 06/11/20 0116 06/12/20 0127 06/13/20 0257 06/14/20 0523 06/15/20 0203 06/15/20 1348 06/16/20 0623  WBC 10.5 10.5 8.9 9.5 10.0  --  12.4* 9.8  HGB 8.5* 8.7* 8.3* 8.7* 8.6*  --  9.5* 8.4*  HCT 25.6* 26.1* 25.9* 26.2* 26.2*  --  30.5* 26.9*  PLT 582* 534* 551* 624* 651*  --  808* 673*  CRP 22.6* 21.1* 19.1* 14.8* 17.9*  --   --   --   BNP  --  204.3* 74.4 54.4 53.6   --   --   --   DDIMER  --  2.83* 3.76* 3.42* 3.67*  --   --   --   PROCALCITON 0.15 0.13 0.12 0.16 0.10 <0.10  --   --   AST  --  33 38 74* 42*  --   --   --   ALT  --  61* 56* 85* 68*  --   --   --   ALKPHOS  --  197* 224* 270* 255*  --   --   --  BILITOT  --  0.8 0.8 0.6 0.7  --   --   --   ALBUMIN  --  1.3* 1.4* 1.4* 1.5*  --   --   --   LATICACIDVEN  --  1.0  --   --   --   --   --   --        GI prophylaxis: H2 blocker.  Condition -  Guarded  Family Communication  :  Daughter Jared Whorley [phone])  updated over the phone 06/05/20 by previous MD.  I called on 06/06/2020, 06/09/20, 06/10/20, 06/11/20, 06/13/20, 06/16/20  Code Status :  Full Code  Diet :   Diet Order            DIET DYS 3 Room service appropriate? No; Fluid consistency: Thin; Fluid restriction: 1200 mL Fluid  Diet effective now                  Disposition Plan  :  Status is: Inpatient  Remains inpatient appropriate because:Inpatient level of care appropriate due to severity of illness   Dispo: The patient is from: Home              Anticipated d/c is to: SNF              Anticipated d/c date is: > 1 day              Patient currently is not medically stable to d/c.   Barriers to discharge: Persistent hyponatremia  Antimicorbials  :    Anti-infectives (From admission, onward)   Start     Dose/Rate Route Frequency Ordered Stop   06/14/20 0000  vancomycin (VANCOCIN) 50 mg/mL oral solution        125 mg Oral 4 times daily 06/14/20 1011 06/19/20 2359   06/13/20 1200  vancomycin (VANCOCIN) 50 mg/mL oral solution 125 mg        125 mg Oral Every 6 hours 06/13/20 0933     06/10/20 2230  vancomycin (VANCOREADY) IVPB 1250 mg/250 mL        1,250 mg 166.7 mL/hr over 90 Minutes Intravenous  Once 06/10/20 2139 06/11/20 0140   06/10/20 2230  cephALEXin (KEFLEX) capsule 500 mg        500 mg Oral  Once 06/10/20 2139 06/10/20 2155   06/10/20 0000  vancomycin (VANCOCIN) 50 mg/mL oral solution  Status:   Discontinued        125 mg Oral 4 times daily 06/10/20 1053 06/14/20    06/02/20 1000  vancomycin (VANCOCIN) 50 mg/mL oral solution 125 mg        125 mg Oral 4 times daily 06/02/20 0800 06/11/20 2158   05/22/20 1000  remdesivir 100 mg in sodium chloride 0.9 % 100 mL IVPB       "Followed by" Linked Group Details   100 mg 200 mL/hr over 30 Minutes Intravenous Daily 05/21/20 0857 05/25/20 1154   05/21/20 1030  remdesivir 200 mg in sodium chloride 0.9% 250 mL IVPB  Status:  Discontinued       "Followed by" Linked Group Details   200 mg 580 mL/hr over 30 Minutes Intravenous Once 05/21/20 0857 05/27/20 0734   05/21/20 0330  metroNIDAZOLE (FLAGYL) tablet 500 mg        500 mg Oral  Once 05/21/20 0320 05/21/20 0411   05/20/20 2330  cefTRIAXone (ROCEPHIN) 2 g in sodium chloride 0.9 % 100 mL IVPB        2  g 200 mL/hr over 30 Minutes Intravenous  Once 05/20/20 2324 05/21/20 0331      Inpatient Medications  Scheduled Meds: . apixaban  5 mg Oral BID  . famotidine  20 mg Oral Daily  . feeding supplement  237 mL Oral TID BM  . sodium chloride flush  10-40 mL Intracatheter Q12H  . vancomycin  125 mg Oral Q6H   Continuous Infusions:  PRN Meds:.acetaminophen (TYLENOL) oral liquid 160 mg/5 mL, [DISCONTINUED] ondansetron **OR** ondansetron (ZOFRAN) IV   Time Spent in minutes  25   See all Orders from today for further details   Susa Raring M.D on 06/16/2020 at 9:16 AM  To page go to [email or web address] - use universal password  [facility] -  Office  [phone]    Objective:   Vitals:   06/15/20 0814 06/15/20 1450 06/15/20 2110 06/16/20 0520  BP: 115/70 120/68 110/70 117/68  Pulse: 80 92 84 89  Resp: 16 20 19 17   Temp: 98.1 F (36.7 C) 98.1 F (36.7 C) 98.1 F (36.7 C) 98.2 F (36.8 C)  TempSrc: Oral Oral Oral Oral  SpO2: 90% 100% 97% 98%  Weight:   59.4 kg 59.4 kg  Height:        Wt Readings from Last 3 Encounters:  06/16/20 59.4 kg  12/20/14 66 kg      Intake/Output Summary (Last 24 hours) at 06/16/2020 0916 Last data filed at 06/16/2020 0545 Gross per 24 hour  Intake 840 ml  Output 700 ml  Net 140 ml     Physical Exam  Awake Alert, No new F.N deficits, Normal affect Lynch.AT,PERRAL Supple Neck,No JVD, No cervical lymphadenopathy appriciated.  Symmetrical Chest wall movement, Good air movement bilaterally, CTAB RRR,No Gallops, Rubs or new Murmurs, No Parasternal Heave +ve B.Sounds, Abd Soft, No tenderness, No organomegaly appriciated, No rebound - guarding or rigidity. No Cyanosis, Clubbing or edema, No new Rash or bruise    Data Review:    CBC Recent Labs  Lab 06/11/20 0116 06/12/20 0127 06/13/20 0257 06/14/20 0523 06/15/20 1348 06/16/20 0623  WBC 10.5 8.9 9.5 10.0 12.4* 9.8  HGB 8.7* 8.3* 8.7* 8.6* 9.5* 8.4*  HCT 26.1* 25.9* 26.2* 26.2* 30.5* 26.9*  PLT 534* 551* 624* 651* 808* 673*  MCV 85.3 86.6 87.0 87.0 88.2 87.9  MCH 28.4 27.8 28.9 28.6 27.5 27.5  MCHC 33.3 32.0 33.2 32.8 31.1 31.2  RDW 14.5 14.6 14.9 14.6 14.6 14.7  LYMPHSABS 0.9 1.0 1.0 1.0  --   --   MONOABS 0.6 0.6 0.6 0.6  --   --   EOSABS 0.2 0.2 0.2 0.1  --   --   BASOSABS 0.1 0.1 0.1 0.1  --   --     Chemistries  Recent Labs  Lab 06/10/20 1548 06/11/20 0116 06/12/20 0127 06/13/20 0257 06/14/20 0523  NA 128* 130* 130* 132* 132*  K 4.3 4.2 4.0 4.3 4.4  CL 95* 98 98 98 96*  CO2 24 24 23 26 27   GLUCOSE 112* 114* 97 94 105*  BUN 14 10 8 9 10   CREATININE 1.05 1.01 0.87 0.88 0.86  CALCIUM 7.9* 7.9* 8.1* 8.3* 8.4*  MG 1.9 1.9 2.0 2.0 2.0  AST  --  33 38 74* 42*  ALT  --  61* 56* 85* 68*  ALKPHOS  --  197* 224* 270* 255*  BILITOT  --  0.8 0.8 0.6 0.7   ------------------------------------------------------------------------------------------------------------------ No results for input(s): CHOL, HDL, LDLCALC, TRIG, CHOLHDL, LDLDIRECT in the last  72 hours.  Lab Results  Component Value Date   HGBA1C 5.5 05/22/2020    ------------------------------------------------------------------------------------------------------------------ No results for input(s): TSH, T4TOTAL, T3FREE, THYROIDAB in the last 72 hours.  Invalid input(s): FREET3 ------------------------------------------------------------------------------------------------------------------ No results for input(s): VITAMINB12, FOLATE, FERRITIN, TIBC, IRON, RETICCTPCT in the last 72 hours.  Coagulation profile No results for input(s): INR, PROTIME in the last 168 hours.  Recent Labs    06/14/20 0523  DDIMER 3.67*    Cardiac Enzymes No results for input(s): CKMB, TROPONINI, MYOGLOBIN in the last 168 hours.  Invalid input(s): CK ------------------------------------------------------------------------------------------------------------------    Component Value Date/Time   BNP 53.6 06/14/2020 0523    Micro Results Recent Results (from the past 240 hour(s))  Culture, blood (routine x 2)     Status: None   Collection Time: 06/10/20  3:35 PM   Specimen: BLOOD  Result Value Ref Range Status   Specimen Description BLOOD LEFT ANTECUBITAL  Final   Special Requests   Final    BOTTLES DRAWN AEROBIC ONLY Blood Culture adequate volume   Culture   Final    NO GROWTH 5 DAYS Performed at [facility], [address], [place], [place] [address]    Report Status 06/15/2020 FINAL  Final  Culture, blood (routine x 2)     Status: None   Collection Time: 06/10/20  3:46 PM   Specimen: BLOOD LEFT ARM  Result Value Ref Range Status   Specimen Description BLOOD LEFT ARM  Final   Special Requests   Final    BOTTLES DRAWN AEROBIC ONLY Blood Culture adequate volume   Culture   Final    NO GROWTH 5 DAYS Performed at [facility], [address] [place], [place] [address]    Report Status 06/15/2020 FINAL  Final  MRSA PCR Screening     Status: Abnormal   Collection Time: 06/10/20  4:27 PM   Specimen: Nasal Mucosa; Nasopharyngeal   Result Value Ref Range Status   MRSA by PCR POSITIVE (A) NEGATIVE Final    Comment:        The GeneXpert MRSA Assay (FDA approved for NASAL specimens only), is one component of a comprehensive MRSA colonization surveillance program. It is not intended to diagnose MRSA infection nor to guide or monitor treatment for MRSA infections. RESULT CALLED TO, READ BACK BY AND VERIFIED WITH: Kerrin Champagne RN 06/10/20 at 1943 sk  Performed at [facility], [address], [place], [place] [address]     Radiology Reports CT ABDOMEN PELVIS WO CONTRAST  Result Date: 06/01/2020 CLINICAL DATA:  Left upper quadrant diarrhea, history of COVID-19 pneumonia. EXAM: CT ABDOMEN AND PELVIS WITHOUT CONTRAST TECHNIQUE: Multidetector CT imaging of the abdomen and pelvis was performed following the standard protocol without IV contrast. COMPARISON:  05/21/2020 FINDINGS: Lower chest: Bibasilar consolidation is noted increased when compared with the prior exam. These changes are worse on the left than the right. Stable changes of prior COVID-19 pneumonia are noted. No sizable effusion is seen. Hepatobiliary: No focal liver abnormality is seen. No gallstones, gallbladder wall thickening, or biliary dilatation. Pancreas: Unremarkable. No pancreatic ductal dilatation or surrounding inflammatory changes. Spleen: Normal in size without focal abnormality. Adrenals/Urinary Tract: Adrenal glands are within normal limits. Kidneys demonstrate left renal pelvis stone stable in appearance. No obstructive changes are seen. The bladder is well distended. Stomach/Bowel: Diffuse edematous changes of the colonic wall are seen throughout its course increased when compared with the prior exam consistent with a generalized  colitis. Some pericolonic inflammatory changes noted particularly on the right increased from the prior exam. The appendix appears within normal limits. Small bowel and stomach are unremarkable. Vascular/Lymphatic:  Atherosclerotic calcifications of the aorta are noted. IVC filter is noted in place. No sizable lymphadenopathy is noted. Reproductive: Prostate is unremarkable. Other: No abdominal wall hernia or abnormality. No abdominopelvic ascites. Changes of prior hernia repair are noted in the lower abdomen bilaterally Musculoskeletal: Degenerative changes of lumbar spine are noted. IMPRESSION: Increasing colonic wall thickness with pericolonic inflammatory change consistent with progressive colitis. Increasing bilateral lower lobe consolidation without sizable effusion. Some scattered changes consistent with the COVID-19 pneumonia are noted. Stable nonobstructing left renal stone. Electronically Signed   By: Alcide Clever M.D.   On: 06/01/2020 23:59   CT HEAD WO CONTRAST  Result Date: 05/26/2020 CLINICAL DATA:  69 year old male with altered mental status. EXAM: CT HEAD WITHOUT CONTRAST TECHNIQUE: Contiguous axial images were obtained from the base of the skull through the vertex without intravenous contrast. COMPARISON:  Head CT dated 04/18/2010. FINDINGS: Brain: The ventricles and sulci appropriate size for patient's age. The gray-white matter discrimination is preserved. There is no acute intracranial hemorrhage. No mass effect midline shift. No extra-axial fluid collection. Vascular: No hyperdense vessel or unexpected calcification. Skull: Normal. Negative for fracture or focal lesion. Sinuses/Orbits: No acute finding. Other: None IMPRESSION: Unremarkable noncontrast CT of the brain. Electronically Signed   By: Elgie Collard M.D.   On: 05/26/2020 15:45   CT ANGIO CHEST PE W OR WO CONTRAST  Addendum Date: 05/21/2020   ADDENDUM REPORT: 05/21/2020 19:28 ADDENDUM: Critical Value/emergent results were called by telephone at the time of interpretation on 05/21/2020 at 7:27 pm to provider Dr. Dierdre Highman, who verbally acknowledged these results. Of note the airspace disease in the LEFT lower lobe is progressed from CT same  day. Differential remains the same. Electronically Signed   By: Genevive Bi M.D.   On: 05/21/2020 19:28   Result Date: 05/21/2020 CLINICAL DATA:  PE suspected, high probability.  COVID-19 infection EXAM: CT ANGIOGRAPHY CHEST WITH CONTRAST TECHNIQUE: Multidetector CT imaging of the chest was performed using the standard protocol during bolus administration of intravenous contrast. Multiplanar CT image reconstructions and MIPs were obtained to evaluate the vascular anatomy. CONTRAST:  25mL OMNIPAQUE IOHEXOL 300 MG/ML  SOLN COMPARISON:  None. FINDINGS: Cardiovascular: Large filling defect within the proximal LEFT lower lobe pulmonary artery. Filling defect spans the bifurcation of the LEFT lower lobe and lingular branch pulmonary branches. This this filling defect/thrombus is occlusive to the LEFT lower lobe. No LEFT upper lobe filling defects identified. No filling defect identified within the RIGHT lung pulmonary arteries. No evidence of RIGHT ventricular strain with the RV to LV ratio less than 1. Coronary artery calcification and aortic atherosclerotic calcification. Mediastinum/Nodes: No axillary or supraclavicular adenopathy. No mediastinal or hilar adenopathy. No pericardial fluid. Esophagus normal. Lungs/Pleura: There is diffuse very fine airspace disease within the LEFT lower lobe involving the majority of the LEFT lower lobe. Similar milder findings in the posterior RIGHT lower lobe and lingula. Upper Abdomen: Limited view of the liver, kidneys, pancreas are unremarkable. Normal adrenal glands. Musculoskeletal: No aggressive osseous lesion. Review of the MIP images confirms the above findings. IMPRESSION: 1. Large occlusive pulmonary embolism within the proximal LEFT lower lobe pulmonary artery and lingular pulmonary artery. 2. Diffuse fine airspace disease within the LEFT lower lobe with differential including pulmonary infarction, pulmonary hemorrhage, or pneumonia (including COVID pneumonia). 3. No  evidence of RIGHT  ventricular strain. 4. Coronary artery calcification and aortic atherosclerotic calcification. Critical Value/emergent results were called by telephone at the time of interpretation on 05/21/2020 at 6:55 pm to provider [facility] , who verbally acknowledged these results. Electronically Signed: By: Genevive BiStewart  Edmunds M.D. On: 05/21/2020 18:59   IR IVC FILTER PLMT / S&I Lenise Arena/IMG GUID/MOD SED  Result Date: 05/24/2020 INDICATION: 69 year old with recent cardiac arrest likely secondary to pulmonary embolism and COVID-19. Subsequent right ventricular failure. Patient has clot in the left pulmonary arteries and clot in left femoral vein. There is concern that the patient would not tolerate additional clot burden in the pulmonary arteries and request for IVC filter placement. EXAM: IVC FILTER PLACEMENT; IVC VENOGRAM; ULTRASOUND FOR VASCULAR ACCESS Physician: Rachelle HoraAdam R. Lowella DandyHenn, MD MEDICATIONS: None. ANESTHESIA/SEDATION: Versed 1 mg The patient was continuously monitored during the procedure by the interventional radiology nurse under my direct supervision. CONTRAST:  Carbon dioxide FLUOROSCOPY TIME:  Fluoroscopy Time: 3 minutes, 18 seconds, 18 mGy COMPLICATIONS: None immediate. PROCEDURE: Informed consent was obtained for an IVC filter placement from the patient's daughter. Ultrasound demonstrated a patent right internal jugular vein. Ultrasound images were obtained for documentation. The right neck was prepped and draped in a sterile fashion. Maximal barrier sterile technique was utilized including caps, mask, sterile gowns, sterile gloves, sterile drape, hand hygiene and skin antiseptic. The skin was anesthetized with 1% lidocaine. A 21 gauge needle was directed into the vein with ultrasound guidance and a micropuncture dilator set was placed. A wire was advanced into the IVC. The filter sheath was advanced over the wire into the IVC. An IVC venogram was performed with carbon dioxide. Bilateral renal veins  were cannulated using a 5 French catheter and Bentson wire. Bilateral renal veins are identified. Fluoroscopic images were obtained for documentation. A Bard Denali filter was deployed below the lowest renal vein. Post placement images of the filter were obtained. The vascular sheath was removed with manual compression. Bandage placed over the puncture site. FINDINGS: IVC was patent. Bilateral renal veins were identified. The filter was deployed below the lowest renal vein. IMPRESSION: Successful placement of a retrievable IVC filter. PLAN: This IVC filter is potentially retrievable. The patient will be assessed for filter retrieval by Interventional Radiology in approximately 8-12 weeks. Further recommendations regarding filter retrieval, continued surveillance or declaration of device permanence, will be made at that time. Electronically Signed   By: Richarda OverlieAdam  Henn M.D.   On: 05/24/2020 17:57   DG Chest Port 1 View  Result Date: 06/13/2020 CLINICAL DATA:  Shortness of breath, COVID-19 positive. EXAM: PORTABLE CHEST 1 VIEW COMPARISON:  June 11, 2020. FINDINGS: The heart size and mediastinal contours are within normal limits. No pneumothorax is noted. Mild bibasilar subsegmental atelectasis or infiltrates are noted. Small pleural effusions may be present. The visualized skeletal structures are unremarkable. IMPRESSION: Mild bibasilar subsegmental atelectasis or infiltrates are noted with possible small pleural effusions. Electronically Signed   By: Lupita RaiderJames  Green Jr M.D.   On: 06/13/2020 08:06   DG Chest Port 1 View  Result Date: 06/11/2020 CLINICAL DATA:  Shortness of breath. EXAM: PORTABLE CHEST 1 VIEW COMPARISON:  June 10, 2020. FINDINGS: The heart size and mediastinal contours are within normal limits. No pneumothorax is noted. Stable bibasilar opacities are noted concerning for atelectasis or infiltrates with associated pleural effusions, right greater than left. The visualized skeletal structures are  unremarkable. IMPRESSION: Stable bibasilar opacities are noted concerning for atelectasis or infiltrates with associated pleural effusions, right greater than left. Electronically  Signed   By: Lupita Raider M.D.   On: 06/11/2020 11:33   DG Chest Port 1 View  Result Date: 06/10/2020 CLINICAL DATA:  Shortness of breath EXAM: PORTABLE CHEST 1 VIEW COMPARISON:  05/26/2020 FINDINGS: Numerous leads and wires project over the chest. Midline trachea. Normal heart size. Suspect layering tiny bilateral pleural effusions. No pneumothorax. Left greater than right base airspace disease is slightly improved, given differences in technique. Suspect underlying mild pulmonary venous congestion. Removal of left internal jugular line. IMPRESSION: Overall mildly improved aeration. Persistent layering bilateral pleural effusions and slightly improved airspace disease. Suspect developing pulmonary venous congestion. Electronically Signed   By: Jeronimo Greaves M.D.   On: 06/10/2020 15:58   DG CHEST PORT 1 VIEW  Result Date: 05/26/2020 CLINICAL DATA:  Acute respiratory failure.  Hypoxia. EXAM: PORTABLE CHEST 1 VIEW COMPARISON:  05/22/2020. FINDINGS: Interim extubation and removal of NG tube. Left IJ line in stable position. Heart size normal. Diffuse left lung infiltrate. Right base infiltrate. No prominent pleural effusion. No pneumothorax. IMPRESSION: 1. Interim extubation and removal of NG tube. Left IJ line stable position. 2. Diffuse left lung infiltrate and right base infiltrate. Electronically Signed   By: Maisie Fus  Register   On: 05/26/2020 05:50   DG CHEST PORT 1 VIEW  Result Date: 05/22/2020 CLINICAL DATA:  Central line placement, intubation EXAM: PORTABLE CHEST 1 VIEW COMPARISON:  05/21/2020 FINDINGS: Left central line has been placed with the tip in the SVC. No pneumothorax. Endotracheal tube is 5 cm above the carina. Patchy airspace disease throughout the left lung. No focal opacity on the right. Heart is normal  size. IMPRESSION: Endotracheal tube 5 cm above the carina. Left central line tip in the SVC. No pneumothorax. Stable patchy left lung airspace disease. Electronically Signed   By: Charlett Nose M.D.   On: 05/22/2020 21:06   DG Chest Port 1 View  Result Date: 05/21/2020 CLINICAL DATA:  Cough, lower back pain, has not eaten 5 days, emesis EXAM: PORTABLE CHEST 1 VIEW COMPARISON:  Radiograph 04/19/2010, CT 04/18/2010 FINDINGS: Heterogeneous opacities present predominantly along the periphery of the left lung and minimally in the right lung base as well. No pneumothorax or effusion. The aorta is calcified. The remaining cardiomediastinal contours are unremarkable. No acute osseous or soft tissue abnormality. Degenerative changes are present in the imaged spine and shoulders. Telemetry leads overlie the chest. IMPRESSION: Heterogeneous opacities throughout the lungs, greatest in the left lung periphery concerning for pneumonia including potential viral etiology. Aortic Atherosclerosis (ICD10-I70.0). Electronically Signed   By: Kreg Shropshire M.D.   On: 05/21/2020 03:50   DG Abd Portable 1V  Result Date: 05/23/2020 CLINICAL DATA:  Encounter for orogastric tube placement. EXAM: PORTABLE ABDOMEN - 1 VIEW COMPARISON:  06/09/2009 and chest radiograph 05/22/2020 FINDINGS: Gastric tube extends into the abdomen and the tip is in the midline of the lower abdomen, likely in the distal stomach body region. Again noted are patchy densities at the left lung base. Few gas-filled loops of bowel in the abdomen. IMPRESSION: Orogastric tube in the lower abdominal midline and likely in the distal stomach body region. Electronically Signed   By: Richarda Overlie M.D.   On: 05/23/2020 12:17   ECHOCARDIOGRAM COMPLETE  Result Date: 05/23/2020    ECHOCARDIOGRAM REPORT   Patient Name:   Dwayne Barnes Date of Exam: 05/23/2020 Medical Rec #:  [id number]      Height:       72.0 in Accession #:  [id number]     Weight:       142.4 lb Date of  Birth:  April 23, 1951      BSA:          1.844 m Patient Age:    69 years       BP:           112/85 mmHg Patient Gender: M              HR:           96 bpm. Exam Location:  Inpatient Procedure: 2D Echo, Cardiac Doppler and Color Doppler STAT ECHO Indications:    I26.02 Pulmonary embolus  History:        Patient has no prior history of Echocardiogram examinations.                 Arrythmias:Cardiac Arrest; Signs/Symptoms:Dyspnea, Hypotension                 and Altered Mental Status. Covid 19 positive.  Sonographer:    Sheralyn Boatman RDCS Referring Phys: [id number] Martina Sinner  Sonographer Comments: Technically difficult study due to poor echo windows and echo performed with patient supine and on artificial respirator. IMPRESSIONS  1. Severe RV failure and cor pulmonale.  2. Abnormal septal motion and septal flattening consistent with RV pressure overload. Left ventricular ejection fraction, by estimation, is 50 to 55%. The left ventricle has low normal function. The left ventricle has no regional wall motion abnormalities. Left ventricular diastolic parameters were normal.  3. Right ventricular systolic function is severely reduced. The right ventricular size is severely enlarged. There is mildly elevated pulmonary artery systolic pressure.  4. The mitral valve is normal in structure. No evidence of mitral valve regurgitation. No evidence of mitral stenosis.  5. The aortic valve is normal in structure. Aortic valve regurgitation is not visualized. No aortic stenosis is present.  6. The inferior vena cava is dilated in size with <50% respiratory variability, suggesting right atrial pressure of 15 mmHg. FINDINGS  Left Ventricle: Abnormal septal motion and septal flattening consistent with RV pressure overload. Left ventricular ejection fraction, by estimation, is 50 to 55%. The left ventricle has low normal function. The left ventricle has no regional wall motion abnormalities. The left ventricular internal cavity size  was normal in size. There is no left ventricular hypertrophy. Left ventricular diastolic parameters were normal. Right Ventricle: The right ventricular size is severely enlarged. Right vetricular wall thickness was not assessed. Right ventricular systolic function is severely reduced. There is mildly elevated pulmonary artery systolic pressure. The tricuspid regurgitant velocity is 2.67 m/s, and with an assumed right atrial pressure of 8 mmHg, the estimated right ventricular systolic pressure is 36.5 mmHg. Left Atrium: Left atrial size was normal in size. Right Atrium: Right atrial size was normal in size. Pericardium: There is no evidence of pericardial effusion. Mitral Valve: The mitral valve is normal in structure. No evidence of mitral valve regurgitation. No evidence of mitral valve stenosis. Tricuspid Valve: The tricuspid valve is normal in structure. Tricuspid valve regurgitation is mild . No evidence of tricuspid stenosis. Aortic Valve: The aortic valve is normal in structure. Aortic valve regurgitation is not visualized. No aortic stenosis is present. Pulmonic Valve: The pulmonic valve was normal in structure. Pulmonic valve regurgitation is not visualized. No evidence of pulmonic stenosis. Aorta: The aortic root is normal in size and structure. Venous: The inferior vena cava is dilated in size with less than 50% respiratory variability,  suggesting right atrial pressure of 15 mmHg. IAS/Shunts: No atrial level shunt detected by color flow Doppler. Additional Comments: Severe RV failure and cor pulmonale.  LEFT VENTRICLE PLAX 2D LVIDd:         3.70 cm     Diastology LVIDs:         2.80 cm     LV e' medial:    4.46 cm/s LV PW:         1.10 cm     LV E/e' medial:  4.9 LV IVS:        1.10 cm     LV e' lateral:   6.97 cm/s LVOT diam:     2.10 cm     LV E/e' lateral: 3.1 LV SV:         28 LV SV Index:   15 LVOT Area:     3.46 cm  LV Volumes (MOD) LV vol d, MOD A2C: 22.6 ml LV vol d, MOD A4C: 25.5 ml LV vol s, MOD  A2C: 11.1 ml LV vol s, MOD A4C: 11.7 ml LV SV MOD A2C:     11.5 ml LV SV MOD A4C:     25.5 ml LV SV MOD BP:      13.7 ml RIGHT VENTRICLE            IVC RV S prime:     8.84 cm/s  IVC diam: 2.50 cm TAPSE (M-mode): 1.3 cm LEFT ATRIUM           Index      RIGHT ATRIUM           Index LA diam:      2.80 cm 1.52 cm/m RA Area:     14.30 cm LA Vol (A4C): 10.5 ml 5.69 ml/m RA Volume:   42.70 ml  23.15 ml/m  AORTIC VALVE LVOT Vmax:   53.80 cm/s LVOT Vmean:  41.900 cm/s LVOT VTI:    0.080 m  AORTA Ao Root diam: 3.60 cm Ao Asc diam:  3.50 cm MITRAL VALVE               TRICUSPID VALVE MV Area (PHT): 3.27 cm    TR Peak grad:   28.5 mmHg MV Decel Time: 232 msec    TR Vmax:        267.00 cm/s MV E velocity: 21.94 cm/s MV A velocity: 41.30 cm/s  SHUNTS MV E/A ratio:  0.53        Systemic VTI:  0.08 m                            Systemic Diam: 2.10 cm Charlton Haws MD Electronically signed by Charlton Haws MD Signature Date/Time: 05/23/2020/10:46:38 AM    Final    CT RENAL STONE STUDY  Result Date: 05/21/2020 CLINICAL DATA:  69 year old male with low back pain, flank pain, decreased p.o. EXAM: CT ABDOMEN AND PELVIS WITHOUT CONTRAST TECHNIQUE: Multidetector CT imaging of the abdomen and pelvis was performed following the standard protocol without IV contrast. COMPARISON:  Noncontrast CT Abdomen and Pelvis 10/28/2008. Portable chest radiograph 04/19/2010. FINDINGS: Lower chest: Patchy, irregular peribronchial and peripheral scattered pulmonary opacity at both lung bases. This seems to be new from last month. Underlying probable pulmonary hyperinflation. Some associated lung base bronchiectasis. No cavitating areas identified. No cardiomegaly, pericardial effusion or pleural effusion. Hepatobiliary: Negative noncontrast liver and gallbladder. Pancreas: Negative. Spleen: Negative. Adrenals/Urinary Tract: Bulky left nephrolithiasis measuring 13 mm  at the renal pelvis. Additional left lower pole stone. No hydronephrosis. Negative  noncontrast right kidney. No hydroureter. Unremarkable urinary bladder. Incidental pelvic phleboliths. Stomach/Bowel: Decompressed large bowel. There is long segment circumferential wall thickening in the right colon, up to the 12 mm series 4, image 49) with some areas of adjacent mesenteric stranding (coronal image 43). The wall thickening gradually decreases through the transverse colon. No free air. No free fluid. The terminal ileum appear spared. Appendix seems to remain normal on coronal image 32. No dilated small bowel.  Decompressed stomach and duodenum. Vascular/Lymphatic: Normal caliber abdominal aorta. Mild calcified atherosclerosis. Vascular patency is not evaluated in the absence of IV contrast. No lymphadenopathy identified in the absence of contrast. Reproductive: Negative. Other: Previous lower abdominal ventral hernia repair with mesh. No pelvic free fluid. Musculoskeletal: Degenerative changes in the spine. No acute osseous abnormality identified. IMPRESSION: 1. In the abdomen there is circumferential bowel wall thickening and mesenteric stranding involving the right colon. Wall thickening gradually abates through the transverse colon. This is compatible with Acute Nonspecific Colitis. 2. But the lung bases are also abnormal with patchy and irregular peribronchial and peripheral pulmonary opacity suspicious for acute viral/atypical respiratory infection. No areas of cavitation to suggest septic emboli. No pleural effusion. 3. Bulky left nephrolithiasis, but no obstructive uropathy. 4. Previous lower abdominal hernia repair with mesh. Electronically Signed   By: Odessa Fleming M.D.   On: 05/21/2020 02:46   VAS [place] LOWER EXTREMITY VENOUS (DVT)  Result Date: 05/24/2020  Lower Venous DVT Study Indications: Covid-19, pulmonary embolism.  Risk Factors: Confirmed PE. Anticoagulation: Heparin. Limitations: Body habitus, ventilation, did not perform compressions in thigh and popliteal fossa of the left lower  extremity secondary to mobile thrombus, bandages and line. Comparison Study: No prior study Performing Technologist: Sherren Kerns RVS  Examination Guidelines: A complete evaluation includes B-mode imaging, spectral Doppler, color Doppler, and power Doppler as needed of all accessible portions of each vessel. Bilateral testing is considered an integral part of a complete examination. Limited examinations for reoccurring indications may be performed as noted. The reflux portion of the exam is performed with the patient in reverse Trendelenburg.  +---------+---------------+---------+-----------+----------+--------------+ RIGHT    CompressibilityPhasicitySpontaneityPropertiesThrombus Aging +---------+---------------+---------+-----------+----------+--------------+ CFV      Full           Yes      No                                  +---------+---------------+---------+-----------+----------+--------------+ SFJ      Full                                                        +---------+---------------+---------+-----------+----------+--------------+ FV Prox  Full                                                        +---------+---------------+---------+-----------+----------+--------------+ FV Mid   Full                                                        +---------+---------------+---------+-----------+----------+--------------+  FV DistalFull                                                        +---------+---------------+---------+-----------+----------+--------------+ PFV      Full                                                        +---------+---------------+---------+-----------+----------+--------------+ POP      Full           Yes      No                                  +---------+---------------+---------+-----------+----------+--------------+ PTV      Full                                                         +---------+---------------+---------+-----------+----------+--------------+ PERO     Full                                                        +---------+---------------+---------+-----------+----------+--------------+   +---------+---------------+---------+-----------+----------+-------------------+ LEFT     CompressibilityPhasicitySpontaneityPropertiesThrombus Aging      +---------+---------------+---------+-----------+----------+-------------------+ CFV                                                   not visualized                                                            secondary to                                                              bandage/line        +---------+---------------+---------+-----------+----------+-------------------+ SFJ                                                   not visualized  secondary to                                                              bandage/line        +---------+---------------+---------+-----------+----------+-------------------+ FV Prox  Full                                         patent by color     +---------+---------------+---------+-----------+----------+-------------------+ FV Mid                                                patent by color     +---------+---------------+---------+-----------+----------+-------------------+ FV Distal                                             patent by color     +---------+---------------+---------+-----------+----------+-------------------+ PFV                                                   patent by color     +---------+---------------+---------+-----------+----------+-------------------+ POP                                                   patent by color     +---------+---------------+---------+-----------+----------+-------------------+ PTV      Full                                                              +---------+---------------+---------+-----------+----------+-------------------+ PERO     Full                                                             +---------+---------------+---------+-----------+----------+-------------------+     Summary: RIGHT: - There is no evidence of deep vein thrombosis in the lower extremity.  vessels are dilated with significantly sluggish flow, throughout  LEFT: - Findings consistent with acute deep vein thrombosis involving the left femoral vein. - Acute, mobile thrombus noted in the proximal and mid femoral vein. Vessels are dilated with significantly sluggish flow, throughout.  *See table(s) above for measurements and observations. Electronically signed by Waverly Ferrari MD on 05/24/2020 at 6:21:22 PM.    Final

## 2020-06-16 NOTE — Consult Note (Addendum)
[facility] Consult: 11:04 AM 06/16/2020  LOS: 26 days    Referring Provider: Dr Ronnie Derby  Primary Care Physician:  Nicholes Rough, PA-C [facility] [address]. Primary Gastroenterologist:  unassigned   Reason for Consultation:  GI bleed.     HPI: Dwayne Barnes is a 69 y.o. male. Lives in [place]. No significant past medical history. Very hard of hearing  Day 68 of admission for COVID-19 pneumonia, acute large PE.  Suffered  cardiac arrest with downtime 6 minutes before ROSC.  Another cardiac arrest with 4 rounds of CPR before ROSC.  Self extubated 11/23, transferred to [facility] 11/25.  Has been treated for AKI, hyponatremia, hyperglycemia requiring insulin.  C. Difficile (C. difficile antigen and C. difficile toxin +11/30).  IVC filter placed 11/21.  Eliquis initiated but discontinued today, last dose was 2130 last night 12/13.  Has been receiving Pepcid 20 mg/day along w Eliquis.  Day 14 po Vancomycin.   Diarrhea has improved but still having loose, brown stools.  Yesterday staff noticed a little bit of mucousy blood surrounding otherwise brown BM.  The same thing happened again today.  No large volume hematochezia.  No abdominal pain, no rectal pain.  No nausea or vomiting.  He is eating well and generally recovering steadily.  No hemodynamic instability. Patient said he had a colonoscopy in [place] in the year 2000 and does not recall any pathology specific doesn't recall any polyps. No prior history of minor or major GI bleeding. Prior to this admission patient was working out on a treadmill 3 times a week. He is chronically thin but was eating well until he got sick.  06/01/2020 CTAP w/o contrast:   Increasing colonic wall thickening with pericolonic  inflammatory changes consistent with progressive colitis.  Increasing bil lower lobe consolidation.  Scattered lung changes consistent with COVID-19 pneumonia.  Stable, nonobstructing left kidney stone.  Hgb has been averaging in the mid to upper eights, 9.5 yesterday, 8.4 today. Hgb 14.3 to 12 within 6 hours of admission Platelets 673.  MCV 87.  WBCs normal. LFTs rising through 12/11, on the decline as of 12/12. Although improved, CRP remains elevated.  Lactic acidosis resolved.    Past Medical History:  Diagnosis Date  . COVID-19 05/21/2020  . PE (pulmonary thromboembolism) ([place]) 05/2020    Past Surgical History:  Procedure Laterality Date  . IR IVC FILTER PLMT / S&I /IMG GUID/MOD SED  05/24/2020    Prior to Admission medications   Medication Sig Start Date End Date Taking? Authorizing Provider  guaiFENesin (MUCINEX) 600 MG 12 hr tablet Take 600 mg by mouth 2 (two) times daily as needed for cough or to loosen phlegm.   Yes [provider]  hydrocortisone cream 1 % Apply 1 application topically as needed (for bite).    Yes [provider]  ibuprofen (ADVIL,MOTRIN) 200 MG tablet Take 400 mg by mouth 2 (two) times daily as needed for moderate pain.   Yes [provider]  Multiple Vitamins-Minerals (ALIVE MENS ENERGY) TABS Take  1 tablet by mouth daily.   Yes [provider]  Neomycin-Bacitracin-Polymyxin (NEOSPORIN ORIGINAL) 3.5-9318050201 OINT Apply 1 application topically as needed (for bite).   Yes [provider]  Omega-3 Fatty Acids (OMEGA 3 PO) Take 1 capsule by mouth daily.   Yes [provider]  OVER THE COUNTER MEDICATION Take 1 tablet by mouth daily. "ANDROZONE" TESTOSTERONE SUPPLEMENT   Yes [provider]  apixaban (ELIQUIS) 5 MG TABS tablet Take 1 tablet (5 mg total) by mouth 2 (two) times daily. 06/10/20   Thurnell Lose, MD  famotidine (PEPCID) 20 MG tablet Take 1 tablet (20 mg total) by mouth daily. 06/16/20    Thurnell Lose, MD  vancomycin (VANCOCIN) 50 mg/mL oral solution Take 2.5 mLs (125 mg total) by mouth 4 (four) times daily for 5 days. 06/14/20 06/19/20  Thurnell Lose, MD    Scheduled Meds: . famotidine  20 mg Oral Daily  . feeding supplement  237 mL Oral TID BM  . hydrocortisone  25 mg Rectal BID  . sodium chloride flush  10-40 mL Intracatheter Q12H  . vancomycin  125 mg Oral Q6H   Infusions:  PRN Meds: acetaminophen (TYLENOL) oral liquid 160 mg/5 mL, [DISCONTINUED] ondansetron **OR** ondansetron (ZOFRAN) IV   Allergies as of 05/20/2020  . (No Known Allergies)    Family History  Problem Relation Age of Onset  . Stroke Mother 44  . Factor V Leiden deficiency Other     Social History   Socioeconomic History  . Marital status: Widowed    Spouse name: Not on file  . Number of children: Not on file  . Years of education: Not on file  . Highest education level: Not on file  Occupational History  . Occupation: Unemployed  Tobacco Use  . Smoking status: Former Research scientist (life sciences)  . Smokeless tobacco: Never Used  Vaping Use  . Vaping Use: Never used  Substance and Sexual Activity  . Alcohol use: No  . Drug use: No  . Sexual activity: Not on file  Other Topics Concern  . Not on file  Social History Narrative  . Not on file   Social Determinants of Health   Financial Resource Strain: Not on file  Food Insecurity: Not on file  Transportation Needs: Not on file  Physical Activity: Not on file  Stress: Not on file  Social Connections: Not on file  Intimate Partner Violence: Not on file    REVIEW OF SYSTEMS: Constitutional: Remains weak but this is improving. ENT:  No nose bleeds Pulm: Shortness of breath and cough improving, not resolved. CV:  No palpitations, no LE edema.  GU:  No hematuria, no frequency GI: See HPI Heme: Denies unusual or excessive bleeding or bruising before this Covid infection. Transfusions: None during this admission or in past. Neuro:  No  headaches, no peripheral tingling or numbness. No syncope, no seizures. Derm:  No itching, no rash or sores.  Endocrine:  No sweats or chills.  No polyuria or dysuria Immunization: Charting says that he has been vaccinated for COVID-19. Travel:  None beyond local counties in last few months.    PHYSICAL EXAM: Vital signs in last 24 hours: Vitals:   06/15/20 2110 06/16/20 0520  BP: 110/70 117/68  Pulse: 84 89  Resp: 19 17  Temp: 98.1 F (36.7 C) 98.2 F (36.8 C)  SpO2: 97% 98%   Wt Readings from Last 3 Encounters:  06/16/20 59.4 kg  12/20/14 66 kg    General: Thin,  almost cachectic appearing. Appears stated age of 35. Head: No facial asymmetry or swelling. No signs of head trauma. Eyes: No conjunctival pallor or scleral icterus. Ears: Very hard of hearing. Had to communicate with him using a white board with written questions. Nose: No congestion or discharge Mouth: Oropharynx moist, pink, clear. Dentures in place. Tongue midline Neck: No JVD, no masses, no thyromegaly. Lungs: Diminished but clear breath sounds bil. Hoarse vocal quality. No labored breathing Heart: RRR. No MRG. S1, S2 present Abdomen: Thin, nontender. Nondistended. Active bowel sounds. No HSM, masses, bruits, hernias appreciated..   Rectal: Deferred Musc/Skeltl: No joint redness, swelling or gross deformity. Atrophy in thenar/snuff box region of his hands. Extremities: No CCE. Neurologic: Alert. Very hard of hearing. Follows commands and instructions. Appropriate. Oriented x3. Moves all 4 limbs without tremor, strength not tested. Skin: No telangiectasia, no rash, no sores Tattoos: Multiple professional quality tattoos on his arms. Nodes: No cervical adenopathy Psych: Calm, cooperative, pleasant.  Intake/Output from previous day: 12/13 0701 - 12/14 0700 In: 840 [P.O.:840] Out: 700 [Urine:700] Intake/Output this shift: Total I/O In: -  Out: 500 [Urine:500]  LAB RESULTS: Recent Labs     06/14/20 0523 06/15/20 1348 06/16/20 0623  WBC 10.0 12.4* 9.8  HGB 8.6* 9.5* 8.4*  HCT 26.2* 30.5* 26.9*  PLT 651* 808* 673*   BMET Lab Results  Component Value Date   NA 132 (L) 06/14/2020   NA 132 (L) 06/13/2020   NA 130 (L) 06/12/2020   K 4.4 06/14/2020   K 4.3 06/13/2020   K 4.0 06/12/2020   CL 96 (L) 06/14/2020   CL 98 06/13/2020   CL 98 06/12/2020   CO2 27 06/14/2020   CO2 26 06/13/2020   CO2 23 06/12/2020   GLUCOSE 105 (H) 06/14/2020   GLUCOSE 94 06/13/2020   GLUCOSE 97 06/12/2020   BUN 10 06/14/2020   BUN 9 06/13/2020   BUN 8 06/12/2020   CREATININE 0.86 06/14/2020   CREATININE 0.88 06/13/2020   CREATININE 0.87 06/12/2020   CALCIUM 8.4 (L) 06/14/2020   CALCIUM 8.3 (L) 06/13/2020   CALCIUM 8.1 (L) 06/12/2020   LFT Recent Labs    06/14/20 0523  PROT 5.3*  ALBUMIN 1.5*  AST 42*  ALT 68*  ALKPHOS 255*  BILITOT 0.7   PT/INR Lab Results  Component Value Date   INR 1.8 (H) 05/22/2020   INR 1.01 04/18/2010   INR 0.9 12/05/2006   Hepatitis Panel No results for input(s): HEPBSAG, HCVAB, HEPAIGM, HEPBIGM in the last 72 hours. C-Diff No components found for: CDIFF Lipase     Component Value Date/Time   LIPASE 31 05/20/2020 1951    Drugs of Abuse  No results found for: LABOPIA, COCAINSCRNUR, LABBENZ, AMPHETMU, THCU, LABBARB   RADIOLOGY STUDIES: No results found.    IMPRESSION:   *   Minor to, at most, moderate bleeding per rectum.  Brown stools associated with some mucoid blood. Could be hemorrhoids, Anusol suppositories initiated today. Unremarkable (per pt) 2000colonoscopy in [place], [place] R/O neoplasm, AVMs, ischemic colitis (given recent cardiac arrest requiring CPR, however this in painless bleeding)    *    Normocytic anemia.  Relatively stable as of this morning's Hgb.   *   C diff colitis.  D 14 po Vanc.    *   Covid 19 PNA, breakthrough in vaccinated patient. Complicated by large PE at arrival.  Was on IV heparin, then  Eliquis.  Eliquis currently on hold.  Last dose was  PM of 12/13.  IVC filter placed 11/21.  *   Elevated LFTs in a cholestatic pattern with more significant elevation of alk phos than transaminases.  Bilirubin normal.  CT scan without contrast reveals no evidence of liver or biliary disease.  Suspect the cause is Covid related.  Seems late to still have ischemic injury, hypoperfusion to liver due to cardiac arrest 3 weeks ago.      PLAN:     *   Supportive care for now.  Follow Hb and hematocrit.  *    Colonoscopy?,  MD to determine and timing TBD.  *   Check Hep ABC serologies.     Azucena Freed  06/16/2020, 11:04 AM Phone [phone]   Attending physician's note   I have taken a history, examined the patient and reviewed the chart. I agree with the Advanced Practitioner's note, impression and recommendations.  69 year old male extremely hard of hearing, COVID-19 pneumonia, massive PE, s/p cardiac arrest, C. difficile colitis  He s/p IVC filter on Eliquis, last dose December 13  Patient has improvement of diarrhea with oral vancomycin but continues to have semiformed stool He had bright red blood per rectum along with brown stool, likely etiology bleeding from internal hemorrhoids No recent colonoscopy, report not available in epic to review  Cannot exclude neoplastic lesion or proctitis or rectal ulcer  Hemoglobin dropped from 9.5-8.4  Add Metamucil 1 tablespoon 3 times daily with meals  Anusol suppository per rectum twice daily  Continue Eliquis  If continues to have persistent hematochezia, will plan to proceed with colonoscopy for further evaluation   The patient was provided an opportunity to ask questions and all were answered. The patient agreed with the plan and demonstrated an understanding of the instructions.  Damaris Hippo , MD [phone]

## 2020-06-17 DIAGNOSIS — K529 Noninfective gastroenteritis and colitis, unspecified: Secondary | ICD-10-CM

## 2020-06-17 DIAGNOSIS — I2601 Septic pulmonary embolism with acute cor pulmonale: Secondary | ICD-10-CM

## 2020-06-17 DIAGNOSIS — I82402 Acute embolism and thrombosis of unspecified deep veins of left lower extremity: Secondary | ICD-10-CM

## 2020-06-17 DIAGNOSIS — K921 Melena: Secondary | ICD-10-CM

## 2020-06-17 LAB — BASIC METABOLIC PANEL
Anion gap: 10 (ref 5–15)
BUN: 10 mg/dL (ref 8–23)
CO2: 26 mmol/L (ref 22–32)
Calcium: 8.5 mg/dL — ABNORMAL LOW (ref 8.9–10.3)
Chloride: 97 mmol/L — ABNORMAL LOW (ref 98–111)
Creatinine, Ser: 0.95 mg/dL (ref 0.61–1.24)
GFR, Estimated: >60 mL/min (ref >60)
Glucose, Bld: 99 mg/dL (ref 70–99)
Potassium: 4.2 mmol/L (ref 3.5–5.1)
Sodium: 133 mmol/L — ABNORMAL LOW (ref 135–145)

## 2020-06-17 LAB — CBC
HCT: 26.1 % — ABNORMAL LOW (ref 39.0–52.0)
Hemoglobin: 8.5 g/dL — ABNORMAL LOW (ref 13.0–17.0)
MCH: 28.1 pg (ref 26.0–34.0)
MCHC: 32.6 g/dL (ref 30.0–36.0)
MCV: 86.4 fL (ref 80.0–100.0)
Platelets: 650 10*3/uL — ABNORMAL HIGH (ref 150–400)
RBC: 3.02 MIL/uL — ABNORMAL LOW (ref 4.22–5.81)
RDW: 14.7 % (ref 11.5–15.5)
WBC: 10.1 10*3/uL (ref 4.0–10.5)
nRBC: 0 % (ref 0.0–0.2)

## 2020-06-17 LAB — HEPATITIS B SURFACE ANTIBODY,QUALITATIVE: Hep B S Ab: NONREACTIVE

## 2020-06-17 LAB — HEPATITIS C ANTIBODY: HCV Ab: NONREACTIVE

## 2020-06-17 LAB — HEPATITIS B SURFACE ANTIGEN: Hepatitis B Surface Ag: NONREACTIVE

## 2020-06-17 MED ORDER — APIXABAN 5 MG PO TABS
5.0000 mg | ORAL_TABLET | Freq: Two times a day (BID) | ORAL | Status: DC
  Administered 2020-06-17 – 2020-06-25 (×17): 5 mg via ORAL
  Filled 2020-06-17 (×17): qty 1

## 2020-06-17 MED ORDER — ADULT MULTIVITAMIN W/MINERALS CH
1.0000 | ORAL_TABLET | Freq: Every day | ORAL | Status: DC
  Administered 2020-06-17 – 2020-06-25 (×9): 1 via ORAL
  Filled 2020-06-17 (×9): qty 1

## 2020-06-17 NOTE — Progress Notes (Signed)
[facility] ROUNDING NOTE   Subjective: No further bleeding   Objective: Vital signs in last 24 hours: Temp:  [98.1 F (36.7 C)-99.1 F (37.3 C)] 98.1 F (36.7 C) (12/15 0535) Pulse Rate:  [87-91] 87 (12/15 0535) Resp:  [18-20] 18 (12/15 0535) BP: (107-133)/(67-88) 107/68 (12/15 0535) SpO2:  [97 %-98 %] 97 % (12/15 0535) Weight:  [59.1 kg] 59.1 kg (12/15 0056) Last BM Date: 06/16/20 General: NAD   Intake/Output from previous day: 12/14 0701 - 12/15 0700 In: 600 [P.O.:600] Out: 2850 [Urine:2850] Intake/Output this shift: No intake/output data recorded.   Lab Results: Recent Labs    06/16/20 1233 06/16/20 1643 06/17/20 0433  WBC 10.0 10.4 10.1  HGB 8.6* 8.2* 8.5*  PLT 676* 667* 650*  MCV 88.2 88.0 86.4   BMET Recent Labs    06/16/20 1233 06/17/20 0433  NA 134* 133*  K 4.5 4.2  CL 96* 97*  CO2 27 26  GLUCOSE 96 99  BUN 7* 10  CREATININE 0.88 0.95  CALCIUM 8.5* 8.5*   LFT No results for input(s): PROT, ALBUMIN, AST, ALT, ALKPHOS, BILITOT, BILIDIR, IBILI in the last 72 hours. PT/INR No results for input(s): INR in the last 72 hours.    Imaging/Other results: No results found.    Assessment &Plan  69 year old male with multiple comorbidities, recent COVID-19 pneumonia, massive PE, s/p cardiac arrest, C. difficile colitis  Small-volume bright red blood per rectum likely secondary to bleeding from hemorrhoids He had a  bowel movement this afternoon with brown stool and no rectal bleeding Continue Anusol suppositories twice daily for 5 to 7 days  Hemoglobin has remained stable  Continue Eliquis  Please call us back if needed, GI will sign off    I have spent 25 minutes of patient care (this includes precharting, chart review, review of results, face-to-face time used for counseling as well as treatment plan and follow-up.  Iona Beard , MD [phone]  [facility]

## 2020-06-17 NOTE — Progress Notes (Addendum)
Nutrition Follow-up  DOCUMENTATION CODES:   Underweight,Severe malnutrition in context of acute illness/injury  INTERVENTION:    Ensure Enlive po TID, each supplement provides 350 kcal and 20 grams of protein  MVI daily   NUTRITION DIAGNOSIS:   Severe Malnutrition related to acute illness (COVID 19/colitits) as evidenced by severe fat depletion,severe muscle depletion.  Ongoing  GOAL:   Patient will meet greater than or equal to 90% of their needs  Progressing  MONITOR:   PO intake,Supplement acceptance,Diet advancement,Skin,Weight trends,I & O's,Labs  REASON FOR ASSESSMENT:   Ventilator,Consult Enteral/tube feeding initiation and management  ASSESSMENT:   69 yo male with no known PMH presents with N/V, weakness and back and admitted with COVID 19 pneumonia on 11/18, had PEA arrest on 11/19 and required intubation.  11/17- admit 11/19- PEA arrest, intubated 11/23- self extubated  11/29- worsening diarrhea, progressive colitis  Diarrhea improved but still continues with loose stools. Appetite progressing per patient. Last three meals documented as 40%, 100%, and 100%. RD observed empty Ensure bottle at bedside. Pt reports he drinks three daily.   NFPE performed. Of note pt at baseline has slender build, but shows some decline in weight. He meets criteria for malnutrition likely as a result of COVID/colitits.   UOP: 2850 ml x 24 hrs   Medications: metamucil Labs: Na 133 (L) CBG 96-99  NUTRITION - FOCUSED PHYSICAL EXAM:  Flowsheet Row Most Recent Value  Orbital Region Moderate depletion  Upper Arm Region Severe depletion  Thoracic and Lumbar Region Unable to assess  Buccal Region Severe depletion  Temple Region Severe depletion  Clavicle Bone Region Severe depletion  Clavicle and Acromion Bone Region Severe depletion  Scapular Bone Region Unable to assess  Dorsal Hand Unable to assess  Patellar Region Severe depletion  Anterior Thigh Region Severe  depletion  Posterior Calf Region Severe depletion  Edema (RD Assessment) Unable to assess  Hair Reviewed  Eyes Reviewed  Mouth Reviewed  Skin Reviewed  Nails Reviewed     Diet Order:   Diet Order            DIET DYS 3 Room service appropriate? No; Fluid consistency: Thin; Fluid restriction: 1200 mL Fluid  Diet effective now                 EDUCATION NEEDS:   Education needs have been addressed  Skin:  Skin Assessment: Reviewed RN Assessment Skin Integrity Issues:: Other (Comment) Other: puncture R throat, skin tear L buttocks  Last BM:  12/14  Height:   Ht Readings from Last 1 Encounters:  05/22/20 6' (1.829 m)    Weight:   Wt Readings from Last 1 Encounters:  06/17/20 59.1 kg    Ideal Body Weight:  80.9 kg  BMI:  Body mass index is 17.67 kg/m.  Estimated Nutritional Needs:   Kcal:  9163-8466 kcals  Protein:  95-130 g  Fluid:  1.2 L/day  Vanessa Kick RD, LDN Clinical Nutrition Pager listed in AMION

## 2020-06-17 NOTE — Progress Notes (Signed)
[facility]                                                                              Patient Demographics  Dwayne Barnes, is a 69 y.o. male, DOB - [birth year], ZOX:[id number]  Admit date - 05/20/2020   Admitting Physician Jonah Blue, MD  Outpatient Primary MD for the patient is [name], PA-C  Outpatient specialists:   LOS - 27  days   Medical records reviewed and are as summarized below:    Chief Complaint  Patient presents with  . Fatigue       Brief summary   Patient is a 69 y.o. male with no significant past medical history-presented to the ED on 11/17 with nausea/vomiting, weakness and back pain-further evaluation revealed COVID-19 infection and a large PE.  Patient was started on IV heparin infusion along with steroid/Remdesivir-on 11/19-patient had a cardiac arrest-was intubated during the code-total downtime of around 6 minutes before ROSC.  Patient was subsequently transferred to the ICU-unfortunately soon after arrival in the ICU-he had another cardiac arrest-and 4 rounds of CPR were provided with ROSC.  Patient was provided supportive care-he subsequently improved-self extubated on 11/23-he was [facility] on 11/25.  Further hospital course complicated by diarrhea secondary to C. difficile along with AKI and hyponatremia.See below for further details.  COVID-19 vaccination status: Vaccinated Significant Events: 11/17>> Admit to [facility] for PE/COVID-19 infection 11/19>> PEA cardiac arrest x 2-given TPA-intubated-transfer to ICU 11/23>> Self extubated 11/25>> transfer to [facility] 11/29>> worsening diarrhea-CT abdomen with progressive colitis  Significant studies: 11/18>>CT renal stone: non specific colitis, GGO's, left nephrolithiasis without obstructive uropathy. 11/18>>CTA chest: large PC in LLL, GGO's LLL. 11/20 echo>> EF 50-55%, RV systolic function severely reduced. 11/21>> B/L extremity Doppler: acute DVTin left  femoral vein, acute mobile thrombus in the proximal femoral vein. 11/23>>CT Head: unremarkable 11/29>> CT abdomen/pelvis>> progressive colitis  COVID-19 medications: Steroids: 11/17>>11/26 Remdesivir: 11/17>> 11/22   Assessment & Plan    Principal Problem: Cardiac arrest due to large acute PE and DVT -PE likely provoked by COVID-19 infection -Status post TPA, was on therapeutic Lovenox, transitioned to Eliquis, then heparin, now has IVC filter  -PCCM MD discussed with Dr. Gala Romney, no indication for any acute intervention. -2D echo showed EF of 50 to 55%, RV systolic function severely reduced  Large PE with DVT - s/p TPA on 11/19, when he had cardiac arrest. S/p IVC filter given mobile thrombus seen on the lower extremity Doppler.  He was then transitioned to Eliquis, subsequently held due to GI bleed on 06/16/20  Acute hypoxic respiratory failure, due to PE/cardiac arrest/COVID-19 pneumonia -Currently stable on room air, self extubated on 11/23 -Has completed a course of penicillin, steroids.  Hypoxia resolved, currently on room air  GI bleed, noted on 06/15/2020 -Patient was on anticoagulation for PE, had on 12/14, has IVC filter -GI was consulted.  Seen by Dr. Lavon Paganini, recommended to continue eliquis, if has persistent hematochezia then will plan colonoscopy for evaluation -H&H stable today 8.5, will resume Eliquis today and monitor H&H or any bleeding -Recommended Anusol suppository twice daily, Metamucil  Acute metabolic encephalopathy,  ICU delirium, possible anoxic injury -Much more alert and oriented today, CT head without acute changes. Minimize sedating medications, continue supportive care  Dysphagia In the setting of cardiac arrest, seen by SLP, required NG tube Diet gradually upgraded to dysphagia 3 diet   C. difficile colitis:  -Diarrhea improving, AKI resolved, leukocytosis improved.   -Continue oral vancomycin   AKI:  -Likely due to diarrhea, #1,   -Resolved with IV fluids  Paroxysmal atrial fibrillation -Back in NSR, on anticoagulation resumed after GI clearance -2D echo as above, rate controlled   Normocytic anemia Secondary to critical illness, H&H currently stable  Transaminitis Likely secondary to COVID-19, downtrending  Generalized debility, deconditioning -PT OT evaluation completed, recommendations for SNF -SNF requires stools to be formed and solidifies clinically resolved   Hard of hearing - supportive care.  Hyponatremia  -Urine osmolality 311, serum osmolality 270, points towards SIADH however hypotension had required IV fluids.  Also drinks more than 5-6 bottles of soda every day -Currently alert and awake, no acute issues, NA 133  Underweight, moderate protein calorie malnutrition, albumin 1.5 on 12/12 Estimated body mass index is 17.67 kg/m as calculated from the following:   Height as of this encounter: 6' (1.829 m).   Weight as of this encounter: 59.1 kg.  Nutritional supplements  Code Status: Full CODE STATUS DVT Prophylaxis: We will resume Eliquis Family Communication: Discussed all imaging results, lab results, explained to the patient   Disposition Plan:     Status is: Inpatient  Remains inpatient appropriate because:Inpatient level of care appropriate due to severity of illness   Dispo: The patient is from: Home              Anticipated d/c is to: SNF              Anticipated d/c date is: 3 days              Patient currently is not medically stable to d/c.  Receiving eliquis today, if H&H stable, diarrhea improves, should be able to discharge to SNF in next 24 to 48 hours      Time Spent in minutes   35 minutes  Procedures:   ETT>>11/19 - 11/23 IVC filter placement by IR>>11/21.   Consultants:   PCCM Interventional radiology  Antimicrobials:   Oral vancomycin: 11/30>>  Microbiology data: 11/18>> urine culture: Insignificant growth 11/30>> stool C. difficile:  +ve 11/30>> GI pathogen panel: negative    Anti-infectives (From admission, onward)   Start     Dose/Rate Route Frequency Ordered Stop   06/14/20 0000  vancomycin (VANCOCIN) 50 mg/mL oral solution        125 mg Oral 4 times daily 06/14/20 1011 06/19/20 2359   06/13/20 1200  vancomycin (VANCOCIN) 50 mg/mL oral solution 125 mg        125 mg Oral Every 6 hours 06/13/20 0933     06/10/20 2230  vancomycin (VANCOREADY) IVPB 1250 mg/250 mL        1,250 mg 166.7 mL/hr over 90 Minutes Intravenous  Once 06/10/20 2139 06/11/20 0140   06/10/20 2230  cephALEXin (KEFLEX) capsule 500 mg        500 mg Oral  Once 06/10/20 2139 06/10/20 2155   06/10/20 0000  vancomycin (VANCOCIN) 50 mg/mL oral solution  Status:  Discontinued        125 mg Oral 4 times daily 06/10/20 1053 06/14/20    06/02/20 1000  vancomycin (VANCOCIN) 50 mg/mL oral solution 125 mg  125 mg Oral 4 times daily 06/02/20 0800 06/11/20 2158   05/22/20 1000  remdesivir 100 mg in sodium chloride 0.9 % 100 mL IVPB       "Followed by" Linked Group Details   100 mg 200 mL/hr over 30 Minutes Intravenous Daily 05/21/20 0857 05/25/20 1154   05/21/20 1030  remdesivir 200 mg in sodium chloride 0.9% 250 mL IVPB  Status:  Discontinued       "Followed by" Linked Group Details   200 mg 580 mL/hr over 30 Minutes Intravenous Once 05/21/20 0857 05/27/20 0734   05/21/20 0330  metroNIDAZOLE (FLAGYL) tablet 500 mg        500 mg Oral  Once 05/21/20 0320 05/21/20 0411   05/20/20 2330  cefTRIAXone (ROCEPHIN) 2 g in sodium chloride 0.9 % 100 mL IVPB        2 g 200 mL/hr over 30 Minutes Intravenous  Once 05/20/20 2324 05/21/20 0331          Medications  Scheduled Meds: . famotidine  20 mg Oral Daily  . feeding supplement  237 mL Oral TID BM  . hydrocortisone  25 mg Rectal BID  . psyllium  1 packet Oral Daily  . sodium chloride flush  10-40 mL Intracatheter Q12H  . vancomycin  125 mg Oral Q6H   Continuous Infusions: PRN Meds:.acetaminophen  (TYLENOL) oral liquid 160 mg/5 mL, [DISCONTINUED] ondansetron **OR** ondansetron (ZOFRAN) IV      Subjective:   Dwayne Barnes was seen and examined today.  Sitting up, denies any chest pain or shortness of breath, no abdominal pain, fevers or chills. No acute events overnight.  Diarrhea improving. Objective:   Vitals:   06/16/20 1357 06/16/20 2043 06/17/20 0056 06/17/20 0535  BP: 107/65 133/88 108/67 107/68  Pulse: 89 87 91 87  Resp: 17 20 18 18   Temp: 98.3 F (36.8 C) 98.4 F (36.9 C) 99.1 F (37.3 C) 98.1 F (36.7 C)  TempSrc: Axillary Oral Oral Oral  SpO2: 100% 98% 97% 97%  Weight:   59.1 kg   Height:        Intake/Output Summary (Last 24 hours) at 06/17/2020 1250 Last data filed at 06/17/2020 0536 Gross per 24 hour  Intake 360 ml  Output 2350 ml  Net -1990 ml     Wt Readings from Last 3 Encounters:  06/17/20 59.1 kg  12/20/14 66 kg     Exam  General: Alert and oriented x 3, NAD, thin, frail looking  Cardiovascular: S1 S2 auscultated, no murmurs, RRR  Respiratory: Clear to auscultation bilaterally, no wheezing, rales or rhonchi  Gastrointestinal: Soft, nontender, nondistended, + bowel sounds  Ext: no pedal edema bilaterally  Neuro: no new deficits  Musculoskeletal: No digital cyanosis, clubbing  Skin: No rashes  Psych: Normal affect and demeanor   Data Reviewed:  I have personally reviewed following labs and imaging studies  Micro Results Recent Results (from the past 240 hour(s))  Culture, blood (routine x 2)     Status: None   Collection Time: 06/10/20  3:35 PM   Specimen: BLOOD  Result Value Ref Range Status   Specimen Description BLOOD LEFT ANTECUBITAL  Final   Special Requests   Final    BOTTLES DRAWN AEROBIC ONLY Blood Culture adequate volume   Culture   Final    NO GROWTH 5 DAYS Performed at [facility], [address] [place], [place] [address]    Report Status 06/15/2020 FINAL  Final  Culture, blood (routine x  2)      Status: None   Collection Time: 06/10/20  3:46 PM   Specimen: BLOOD LEFT ARM  Result Value Ref Range Status   Specimen Description BLOOD LEFT ARM  Final   Special Requests   Final    BOTTLES DRAWN AEROBIC ONLY Blood Culture adequate volume   Culture   Final    NO GROWTH 5 DAYS Performed at [facility], [address], [place], [place] [address]    Report Status 06/15/2020 FINAL  Final  MRSA PCR Screening     Status: Abnormal   Collection Time: 06/10/20  4:27 PM   Specimen: Nasal Mucosa; Nasopharyngeal  Result Value Ref Range Status   MRSA by PCR POSITIVE (A) NEGATIVE Final    Comment:        The GeneXpert MRSA Assay (FDA approved for NASAL specimens only), is one component of a comprehensive MRSA colonization surveillance program. It is not intended to diagnose MRSA infection nor to guide or monitor treatment for MRSA infections. RESULT CALLED TO, READ BACK BY AND VERIFIED WITH: Kerrin Champagne RN 06/10/20 at 1943 sk  Performed at [facility], [address] [place], [place] [address]     Radiology Reports CT ABDOMEN PELVIS WO CONTRAST  Result Date: 06/01/2020 CLINICAL DATA:  Left upper quadrant diarrhea, history of COVID-19 pneumonia. EXAM: CT ABDOMEN AND PELVIS WITHOUT CONTRAST TECHNIQUE: Multidetector CT imaging of the abdomen and pelvis was performed following the standard protocol without IV contrast. COMPARISON:  05/21/2020 FINDINGS: Lower chest: Bibasilar consolidation is noted increased when compared with the prior exam. These changes are worse on the left than the right. Stable changes of prior COVID-19 pneumonia are noted. No sizable effusion is seen. Hepatobiliary: No focal liver abnormality is seen. No gallstones, gallbladder wall thickening, or biliary dilatation. Pancreas: Unremarkable. No pancreatic ductal dilatation or surrounding inflammatory changes. Spleen: Normal in size without focal abnormality. Adrenals/Urinary Tract: Adrenal glands are within  normal limits. Kidneys demonstrate left renal pelvis stone stable in appearance. No obstructive changes are seen. The bladder is well distended. Stomach/Bowel: Diffuse edematous changes of the colonic wall are seen throughout its course increased when compared with the prior exam consistent with a generalized colitis. Some pericolonic inflammatory changes noted particularly on the right increased from the prior exam. The appendix appears within normal limits. Small bowel and stomach are unremarkable. Vascular/Lymphatic: Atherosclerotic calcifications of the aorta are noted. IVC filter is noted in place. No sizable lymphadenopathy is noted. Reproductive: Prostate is unremarkable. Other: No abdominal wall hernia or abnormality. No abdominopelvic ascites. Changes of prior hernia repair are noted in the lower abdomen bilaterally Musculoskeletal: Degenerative changes of lumbar spine are noted. IMPRESSION: Increasing colonic wall thickness with pericolonic inflammatory change consistent with progressive colitis. Increasing bilateral lower lobe consolidation without sizable effusion. Some scattered changes consistent with the COVID-19 pneumonia are noted. Stable nonobstructing left renal stone. Electronically Signed   By: Alcide Clever M.D.   On: 06/01/2020 23:59   CT HEAD WO CONTRAST  Result Date: 05/26/2020 CLINICAL DATA:  69 year old male with altered mental status. EXAM: CT HEAD WITHOUT CONTRAST TECHNIQUE: Contiguous axial images were obtained from the base of the skull through the vertex without intravenous contrast. COMPARISON:  Head CT dated 04/18/2010. FINDINGS: Brain: The ventricles and sulci appropriate size for patient's age. The gray-white matter discrimination is preserved. There is no acute intracranial hemorrhage. No mass effect midline shift. No extra-axial fluid collection. Vascular: No hyperdense vessel or unexpected calcification. Skull:  Normal. Negative for fracture or focal lesion. Sinuses/Orbits:  No acute finding. Other: None IMPRESSION: Unremarkable noncontrast CT of the brain. Electronically Signed   By: Elgie Collard M.D.   On: 05/26/2020 15:45   CT ANGIO CHEST PE W OR WO CONTRAST  Addendum Date: 05/21/2020   ADDENDUM REPORT: 05/21/2020 19:28 ADDENDUM: Critical Value/emergent results were called by telephone at the time of interpretation on 05/21/2020 at 7:27 pm to provider Dr. Dierdre Highman, who verbally acknowledged these results. Of note the airspace disease in the LEFT lower lobe is progressed from CT same day. Differential remains the same. Electronically Signed   By: Genevive Bi M.D.   On: 05/21/2020 19:28   Result Date: 05/21/2020 CLINICAL DATA:  PE suspected, high probability.  COVID-19 infection EXAM: CT ANGIOGRAPHY CHEST WITH CONTRAST TECHNIQUE: Multidetector CT imaging of the chest was performed using the standard protocol during bolus administration of intravenous contrast. Multiplanar CT image reconstructions and MIPs were obtained to evaluate the vascular anatomy. CONTRAST:  75mL OMNIPAQUE IOHEXOL 300 MG/ML  SOLN COMPARISON:  None. FINDINGS: Cardiovascular: Large filling defect within the proximal LEFT lower lobe pulmonary artery. Filling defect spans the bifurcation of the LEFT lower lobe and lingular branch pulmonary branches. This this filling defect/thrombus is occlusive to the LEFT lower lobe. No LEFT upper lobe filling defects identified. No filling defect identified within the RIGHT lung pulmonary arteries. No evidence of RIGHT ventricular strain with the RV to LV ratio less than 1. Coronary artery calcification and aortic atherosclerotic calcification. Mediastinum/Nodes: No axillary or supraclavicular adenopathy. No mediastinal or hilar adenopathy. No pericardial fluid. Esophagus normal. Lungs/Pleura: There is diffuse very fine airspace disease within the LEFT lower lobe involving the majority of the LEFT lower lobe. Similar milder findings in the posterior RIGHT lower lobe  and lingula. Upper Abdomen: Limited view of the liver, kidneys, pancreas are unremarkable. Normal adrenal glands. Musculoskeletal: No aggressive osseous lesion. Review of the MIP images confirms the above findings. IMPRESSION: 1. Large occlusive pulmonary embolism within the proximal LEFT lower lobe pulmonary artery and lingular pulmonary artery. 2. Diffuse fine airspace disease within the LEFT lower lobe with differential including pulmonary infarction, pulmonary hemorrhage, or pneumonia (including COVID pneumonia). 3. No evidence of RIGHT ventricular strain. 4. Coronary artery calcification and aortic atherosclerotic calcification. Critical Value/emergent results were called by telephone at the time of interpretation on 05/21/2020 at 6:55 pm to provider [facility] , who verbally acknowledged these results. Electronically Signed: By: Genevive Bi M.D. On: 05/21/2020 18:59   IR IVC FILTER PLMT / S&I Lenise Arena GUID/MOD SED  Result Date: 05/24/2020 INDICATION: 69 year old with recent cardiac arrest likely secondary to pulmonary embolism and COVID-19. Subsequent right ventricular failure. Patient has clot in the left pulmonary arteries and clot in left femoral vein. There is concern that the patient would not tolerate additional clot burden in the pulmonary arteries and request for IVC filter placement. EXAM: IVC FILTER PLACEMENT; IVC VENOGRAM; ULTRASOUND FOR VASCULAR ACCESS Physician: Rachelle Hora. Lowella Dandy, MD MEDICATIONS: None. ANESTHESIA/SEDATION: Versed 1 mg The patient was continuously monitored during the procedure by the interventional radiology nurse under my direct supervision. CONTRAST:  Carbon dioxide FLUOROSCOPY TIME:  Fluoroscopy Time: 3 minutes, 18 seconds, 18 mGy COMPLICATIONS: None immediate. PROCEDURE: Informed consent was obtained for an IVC filter placement from the patient's daughter. Ultrasound demonstrated a patent right internal jugular vein. Ultrasound images were obtained for documentation. The  right neck was prepped and draped in a sterile fashion. Maximal barrier sterile technique was utilized including caps,  mask, sterile gowns, sterile gloves, sterile drape, hand hygiene and skin antiseptic. The skin was anesthetized with 1% lidocaine. A 21 gauge needle was directed into the vein with ultrasound guidance and a micropuncture dilator set was placed. A wire was advanced into the IVC. The filter sheath was advanced over the wire into the IVC. An IVC venogram was performed with carbon dioxide. Bilateral renal veins were cannulated using a 5 French catheter and Bentson wire. Bilateral renal veins are identified. Fluoroscopic images were obtained for documentation. A Bard Denali filter was deployed below the lowest renal vein. Post placement images of the filter were obtained. The vascular sheath was removed with manual compression. Bandage placed over the puncture site. FINDINGS: IVC was patent. Bilateral renal veins were identified. The filter was deployed below the lowest renal vein. IMPRESSION: Successful placement of a retrievable IVC filter. PLAN: This IVC filter is potentially retrievable. The patient will be assessed for filter retrieval by Interventional Radiology in approximately 8-12 weeks. Further recommendations regarding filter retrieval, continued surveillance or declaration of device permanence, will be made at that time. Electronically Signed   By: Richarda Overlie M.D.   On: 05/24/2020 17:57   DG Chest Port 1 View  Result Date: 06/13/2020 CLINICAL DATA:  Shortness of breath, COVID-19 positive. EXAM: PORTABLE CHEST 1 VIEW COMPARISON:  June 11, 2020. FINDINGS: The heart size and mediastinal contours are within normal limits. No pneumothorax is noted. Mild bibasilar subsegmental atelectasis or infiltrates are noted. Small pleural effusions may be present. The visualized skeletal structures are unremarkable. IMPRESSION: Mild bibasilar subsegmental atelectasis or infiltrates are noted with  possible small pleural effusions. Electronically Signed   By: Lupita Raider M.D.   On: 06/13/2020 08:06   DG Chest Port 1 View  Result Date: 06/11/2020 CLINICAL DATA:  Shortness of breath. EXAM: PORTABLE CHEST 1 VIEW COMPARISON:  June 10, 2020. FINDINGS: The heart size and mediastinal contours are within normal limits. No pneumothorax is noted. Stable bibasilar opacities are noted concerning for atelectasis or infiltrates with associated pleural effusions, right greater than left. The visualized skeletal structures are unremarkable. IMPRESSION: Stable bibasilar opacities are noted concerning for atelectasis or infiltrates with associated pleural effusions, right greater than left. Electronically Signed   By: Lupita Raider M.D.   On: 06/11/2020 11:33   DG Chest Port 1 View  Result Date: 06/10/2020 CLINICAL DATA:  Shortness of breath EXAM: PORTABLE CHEST 1 VIEW COMPARISON:  05/26/2020 FINDINGS: Numerous leads and wires project over the chest. Midline trachea. Normal heart size. Suspect layering tiny bilateral pleural effusions. No pneumothorax. Left greater than right base airspace disease is slightly improved, given differences in technique. Suspect underlying mild pulmonary venous congestion. Removal of left internal jugular line. IMPRESSION: Overall mildly improved aeration. Persistent layering bilateral pleural effusions and slightly improved airspace disease. Suspect developing pulmonary venous congestion. Electronically Signed   By: Jeronimo Greaves M.D.   On: 06/10/2020 15:58   DG CHEST PORT 1 VIEW  Result Date: 05/26/2020 CLINICAL DATA:  Acute respiratory failure.  Hypoxia. EXAM: PORTABLE CHEST 1 VIEW COMPARISON:  05/22/2020. FINDINGS: Interim extubation and removal of NG tube. Left IJ line in stable position. Heart size normal. Diffuse left lung infiltrate. Right base infiltrate. No prominent pleural effusion. No pneumothorax. IMPRESSION: 1. Interim extubation and removal of NG tube. Left IJ  line stable position. 2. Diffuse left lung infiltrate and right base infiltrate. Electronically Signed   By: Maisie Fus  Register   On: 05/26/2020 05:50   DG CHEST  PORT 1 VIEW  Result Date: 05/22/2020 CLINICAL DATA:  Central line placement, intubation EXAM: PORTABLE CHEST 1 VIEW COMPARISON:  05/21/2020 FINDINGS: Left central line has been placed with the tip in the SVC. No pneumothorax. Endotracheal tube is 5 cm above the carina. Patchy airspace disease throughout the left lung. No focal opacity on the right. Heart is normal size. IMPRESSION: Endotracheal tube 5 cm above the carina. Left central line tip in the SVC. No pneumothorax. Stable patchy left lung airspace disease. Electronically Signed   By: Charlett Nose M.D.   On: 05/22/2020 21:06   DG Chest Port 1 View  Result Date: 05/21/2020 CLINICAL DATA:  Cough, lower back pain, has not eaten 5 days, emesis EXAM: PORTABLE CHEST 1 VIEW COMPARISON:  Radiograph 04/19/2010, CT 04/18/2010 FINDINGS: Heterogeneous opacities present predominantly along the periphery of the left lung and minimally in the right lung base as well. No pneumothorax or effusion. The aorta is calcified. The remaining cardiomediastinal contours are unremarkable. No acute osseous or soft tissue abnormality. Degenerative changes are present in the imaged spine and shoulders. Telemetry leads overlie the chest. IMPRESSION: Heterogeneous opacities throughout the lungs, greatest in the left lung periphery concerning for pneumonia including potential viral etiology. Aortic Atherosclerosis (ICD10-I70.0). Electronically Signed   By: Kreg Shropshire M.D.   On: 05/21/2020 03:50   DG Abd Portable 1V  Result Date: 05/23/2020 CLINICAL DATA:  Encounter for orogastric tube placement. EXAM: PORTABLE ABDOMEN - 1 VIEW COMPARISON:  06/09/2009 and chest radiograph 05/22/2020 FINDINGS: Gastric tube extends into the abdomen and the tip is in the midline of the lower abdomen, likely in the distal stomach body  region. Again noted are patchy densities at the left lung base. Few gas-filled loops of bowel in the abdomen. IMPRESSION: Orogastric tube in the lower abdominal midline and likely in the distal stomach body region. Electronically Signed   By: Richarda Overlie M.D.   On: 05/23/2020 12:17   ECHOCARDIOGRAM COMPLETE  Result Date: 05/23/2020    ECHOCARDIOGRAM REPORT   Patient Name:   Dwayne Barnes Date of Exam: 05/23/2020 Medical Rec #:  [id number]      Height:       72.0 in Accession #:    [id number]     Weight:       142.4 lb Date of Birth:  1950-07-08      BSA:          1.844 m Patient Age:    69 years       BP:           112/85 mmHg Patient Gender: M              HR:           96 bpm. Exam Location:  Inpatient Procedure: 2D Echo, Cardiac Doppler and Color Doppler STAT ECHO Indications:    I26.02 Pulmonary embolus  History:        Patient has no prior history of Echocardiogram examinations.                 Arrythmias:Cardiac Arrest; Signs/Symptoms:Dyspnea, Hypotension                 and Altered Mental Status. Covid 19 positive.  Sonographer:    Sheralyn Boatman RDCS Referring Phys: [id number] Martina Sinner  Sonographer Comments: Technically difficult study due to poor echo windows and echo performed with patient supine and on artificial respirator. IMPRESSIONS  1. Severe RV failure and cor pulmonale.  2.  Abnormal septal motion and septal flattening consistent with RV pressure overload. Left ventricular ejection fraction, by estimation, is 50 to 55%. The left ventricle has low normal function. The left ventricle has no regional wall motion abnormalities. Left ventricular diastolic parameters were normal.  3. Right ventricular systolic function is severely reduced. The right ventricular size is severely enlarged. There is mildly elevated pulmonary artery systolic pressure.  4. The mitral valve is normal in structure. No evidence of mitral valve regurgitation. No evidence of mitral stenosis.  5. The aortic valve is normal in  structure. Aortic valve regurgitation is not visualized. No aortic stenosis is present.  6. The inferior vena cava is dilated in size with <50% respiratory variability, suggesting right atrial pressure of 15 mmHg. FINDINGS  Left Ventricle: Abnormal septal motion and septal flattening consistent with RV pressure overload. Left ventricular ejection fraction, by estimation, is 50 to 55%. The left ventricle has low normal function. The left ventricle has no regional wall motion abnormalities. The left ventricular internal cavity size was normal in size. There is no left ventricular hypertrophy. Left ventricular diastolic parameters were normal. Right Ventricle: The right ventricular size is severely enlarged. Right vetricular wall thickness was not assessed. Right ventricular systolic function is severely reduced. There is mildly elevated pulmonary artery systolic pressure. The tricuspid regurgitant velocity is 2.67 m/s, and with an assumed right atrial pressure of 8 mmHg, the estimated right ventricular systolic pressure is 36.5 mmHg. Left Atrium: Left atrial size was normal in size. Right Atrium: Right atrial size was normal in size. Pericardium: There is no evidence of pericardial effusion. Mitral Valve: The mitral valve is normal in structure. No evidence of mitral valve regurgitation. No evidence of mitral valve stenosis. Tricuspid Valve: The tricuspid valve is normal in structure. Tricuspid valve regurgitation is mild . No evidence of tricuspid stenosis. Aortic Valve: The aortic valve is normal in structure. Aortic valve regurgitation is not visualized. No aortic stenosis is present. Pulmonic Valve: The pulmonic valve was normal in structure. Pulmonic valve regurgitation is not visualized. No evidence of pulmonic stenosis. Aorta: The aortic root is normal in size and structure. Venous: The inferior vena cava is dilated in size with less than 50% respiratory variability, suggesting right atrial pressure of 15 mmHg.  IAS/Shunts: No atrial level shunt detected by color flow Doppler. Additional Comments: Severe RV failure and cor pulmonale.  LEFT VENTRICLE PLAX 2D LVIDd:         3.70 cm     Diastology LVIDs:         2.80 cm     LV e' medial:    4.46 cm/s LV PW:         1.10 cm     LV E/e' medial:  4.9 LV IVS:        1.10 cm     LV e' lateral:   6.97 cm/s LVOT diam:     2.10 cm     LV E/e' lateral: 3.1 LV SV:         28 LV SV Index:   15 LVOT Area:     3.46 cm  LV Volumes (MOD) LV vol d, MOD A2C: 22.6 ml LV vol d, MOD A4C: 25.5 ml LV vol s, MOD A2C: 11.1 ml LV vol s, MOD A4C: 11.7 ml LV SV MOD A2C:     11.5 ml LV SV MOD A4C:     25.5 ml LV SV MOD BP:      13.7 ml RIGHT VENTRICLE  IVC RV S prime:     8.84 cm/s  IVC diam: 2.50 cm TAPSE (M-mode): 1.3 cm LEFT ATRIUM           Index      RIGHT ATRIUM           Index LA diam:      2.80 cm 1.52 cm/m RA Area:     14.30 cm LA Vol (A4C): 10.5 ml 5.69 ml/m RA Volume:   42.70 ml  23.15 ml/m  AORTIC VALVE LVOT Vmax:   53.80 cm/s LVOT Vmean:  41.900 cm/s LVOT VTI:    0.080 m  AORTA Ao Root diam: 3.60 cm Ao Asc diam:  3.50 cm MITRAL VALVE               TRICUSPID VALVE MV Area (PHT): 3.27 cm    TR Peak grad:   28.5 mmHg MV Decel Time: 232 msec    TR Vmax:        267.00 cm/s MV E velocity: 21.94 cm/s MV A velocity: 41.30 cm/s  SHUNTS MV E/A ratio:  0.53        Systemic VTI:  0.08 m                            Systemic Diam: 2.10 cm Charlton Haws MD Electronically signed by Charlton Haws MD Signature Date/Time: 05/23/2020/10:46:38 AM    Final    CT RENAL STONE STUDY  Result Date: 05/21/2020 CLINICAL DATA:  69 year old male with low back pain, flank pain, decreased p.o. EXAM: CT ABDOMEN AND PELVIS WITHOUT CONTRAST TECHNIQUE: Multidetector CT imaging of the abdomen and pelvis was performed following the standard protocol without IV contrast. COMPARISON:  Noncontrast CT Abdomen and Pelvis 10/28/2008. Portable chest radiograph 04/19/2010. FINDINGS: Lower chest: Patchy, irregular  peribronchial and peripheral scattered pulmonary opacity at both lung bases. This seems to be new from last month. Underlying probable pulmonary hyperinflation. Some associated lung base bronchiectasis. No cavitating areas identified. No cardiomegaly, pericardial effusion or pleural effusion. Hepatobiliary: Negative noncontrast liver and gallbladder. Pancreas: Negative. Spleen: Negative. Adrenals/Urinary Tract: Bulky left nephrolithiasis measuring 13 mm at the renal pelvis. Additional left lower pole stone. No hydronephrosis. Negative noncontrast right kidney. No hydroureter. Unremarkable urinary bladder. Incidental pelvic phleboliths. Stomach/Bowel: Decompressed large bowel. There is long segment circumferential wall thickening in the right colon, up to the 12 mm series 4, image 49) with some areas of adjacent mesenteric stranding (coronal image 43). The wall thickening gradually decreases through the transverse colon. No free air. No free fluid. The terminal ileum appear spared. Appendix seems to remain normal on coronal image 32. No dilated small bowel.  Decompressed stomach and duodenum. Vascular/Lymphatic: Normal caliber abdominal aorta. Mild calcified atherosclerosis. Vascular patency is not evaluated in the absence of IV contrast. No lymphadenopathy identified in the absence of contrast. Reproductive: Negative. Other: Previous lower abdominal ventral hernia repair with mesh. No pelvic free fluid. Musculoskeletal: Degenerative changes in the spine. No acute osseous abnormality identified. IMPRESSION: 1. In the abdomen there is circumferential bowel wall thickening and mesenteric stranding involving the right colon. Wall thickening gradually abates through the transverse colon. This is compatible with Acute Nonspecific Colitis. 2. But the lung bases are also abnormal with patchy and irregular peribronchial and peripheral pulmonary opacity suspicious for acute viral/atypical respiratory infection. No areas of  cavitation to suggest septic emboli. No pleural effusion. 3. Bulky left nephrolithiasis, but no obstructive uropathy. 4. Previous lower abdominal hernia  repair with mesh. Electronically Signed   By: Odessa Fleming M.D.   On: 05/21/2020 02:46   VAS [place] LOWER EXTREMITY VENOUS (DVT)  Result Date: 05/24/2020  Lower Venous DVT Study Indications: Covid-19, pulmonary embolism.  Risk Factors: Confirmed PE. Anticoagulation: Heparin. Limitations: Body habitus, ventilation, did not perform compressions in thigh and popliteal fossa of the left lower extremity secondary to mobile thrombus, bandages and line. Comparison Study: No prior study Performing Technologist: Sherren Kerns RVS  Examination Guidelines: A complete evaluation includes B-mode imaging, spectral Doppler, color Doppler, and power Doppler as needed of all accessible portions of each vessel. Bilateral testing is considered an integral part of a complete examination. Limited examinations for reoccurring indications may be performed as noted. The reflux portion of the exam is performed with the patient in reverse Trendelenburg.  +---------+---------------+---------+-----------+----------+--------------+ RIGHT    CompressibilityPhasicitySpontaneityPropertiesThrombus Aging +---------+---------------+---------+-----------+----------+--------------+ CFV      Full           Yes      No                                  +---------+---------------+---------+-----------+----------+--------------+ SFJ      Full                                                        +---------+---------------+---------+-----------+----------+--------------+ FV Prox  Full                                                        +---------+---------------+---------+-----------+----------+--------------+ FV Mid   Full                                                        +---------+---------------+---------+-----------+----------+--------------+ FV DistalFull                                                         +---------+---------------+---------+-----------+----------+--------------+ PFV      Full                                                        +---------+---------------+---------+-----------+----------+--------------+ POP      Full           Yes      No                                  +---------+---------------+---------+-----------+----------+--------------+ PTV      Full                                                        +---------+---------------+---------+-----------+----------+--------------+  PERO     Full                                                        +---------+---------------+---------+-----------+----------+--------------+   +---------+---------------+---------+-----------+----------+-------------------+ LEFT     CompressibilityPhasicitySpontaneityPropertiesThrombus Aging      +---------+---------------+---------+-----------+----------+-------------------+ CFV                                                   not visualized                                                            secondary to                                                              bandage/line        +---------+---------------+---------+-----------+----------+-------------------+ SFJ                                                   not visualized                                                            secondary to                                                              bandage/line        +---------+---------------+---------+-----------+----------+-------------------+ FV Prox  Full                                         patent by color     +---------+---------------+---------+-----------+----------+-------------------+ FV Mid                                                patent by color      +---------+---------------+---------+-----------+----------+-------------------+ FV Distal  patent by color     +---------+---------------+---------+-----------+----------+-------------------+ PFV                                                   patent by color     +---------+---------------+---------+-----------+----------+-------------------+ POP                                                   patent by color     +---------+---------------+---------+-----------+----------+-------------------+ PTV      Full                                                             +---------+---------------+---------+-----------+----------+-------------------+ PERO     Full                                                             +---------+---------------+---------+-----------+----------+-------------------+     Summary: RIGHT: - There is no evidence of deep vein thrombosis in the lower extremity.  vessels are dilated with significantly sluggish flow, throughout  LEFT: - Findings consistent with acute deep vein thrombosis involving the left femoral vein. - Acute, mobile thrombus noted in the proximal and mid femoral vein. Vessels are dilated with significantly sluggish flow, throughout.  *See table(s) above for measurements and observations. Electronically signed by Waverly Ferrari MD on 05/24/2020 at 6:21:22 PM.    Final     Lab Data:  CBC: Recent Labs  Lab 06/11/20 0116 06/12/20 0127 06/13/20 0257 06/14/20 0523 06/15/20 1348 06/16/20 0623 06/16/20 1233 06/16/20 1643 06/17/20 0433  WBC 10.5 8.9 9.5 10.0 12.4* 9.8 10.0 10.4 10.1  NEUTROABS 8.6* 6.9 7.3 8.0*  --   --   --   --   --   HGB 8.7* 8.3* 8.7* 8.6* 9.5* 8.4* 8.6* 8.2* 8.5*  HCT 26.1* 25.9* 26.2* 26.2* 30.5* 26.9* 27.7* 26.3* 26.1*  MCV 85.3 86.6 87.0 87.0 88.2 87.9 88.2 88.0 86.4  PLT 534* 551* 624* 651* 808* 673* 676* 667* 650*   Basic Metabolic Panel: Recent  Labs  Lab 06/10/20 1548 06/11/20 0116 06/12/20 0127 06/13/20 0257 06/14/20 0523 06/16/20 1233 06/17/20 0433  NA 128* 130* 130* 132* 132* 134* 133*  K 4.3 4.2 4.0 4.3 4.4 4.5 4.2  CL 95* 98 98 98 96* 96* 97*  CO2 24 24 23 26 27 27 26   GLUCOSE 112* 114* 97 94 105* 96 99  BUN 14 10 8 9 10  7* 10  CREATININE 1.05 1.01 0.87 0.88 0.86 0.88 0.95  CALCIUM 7.9* 7.9* 8.1* 8.3* 8.4* 8.5* 8.5*  MG 1.9 1.9 2.0 2.0 2.0  --   --    GFR: Estimated Creatinine Clearance: 62.2 mL/min (by C-G formula based on SCr of 0.95 mg/dL). Liver Function Tests: Recent Labs  Lab 06/11/20 0116 06/12/20 0127 06/13/20 0257 06/14/20 0523  AST 33 38 74* 42*  ALT 61* 56* 85* 68*  ALKPHOS 197* 224* 270* 255*  BILITOT 0.8 0.8 0.6 0.7  PROT 4.8* 5.0* 5.2* 5.3*  ALBUMIN 1.3* 1.4* 1.4* 1.5*   No results for input(s): LIPASE, AMYLASE in the last 168 hours. No results for input(s): AMMONIA in the last 168 hours. Coagulation Profile: No results for input(s): INR, PROTIME in the last 168 hours. Cardiac Enzymes: No results for input(s): CKTOTAL, CKMB, CKMBINDEX, TROPONINI in the last 168 hours. BNP (last 3 results) No results for input(s): PROBNP in the last 8760 hours. HbA1C: No results for input(s): HGBA1C in the last 72 hours. CBG: Recent Labs  Lab 06/12/20 2118 06/13/20 0757 06/13/20 1236 06/13/20 1717 06/13/20 2100  GLUCAP 108* 108* 122* 117* 107*   Lipid Profile: No results for input(s): CHOL, HDL, LDLCALC, TRIG, CHOLHDL, LDLDIRECT in the last 72 hours. Thyroid Function Tests: No results for input(s): TSH, T4TOTAL, FREET4, T3FREE, THYROIDAB in the last 72 hours. Anemia Panel: No results for input(s): VITAMINB12, FOLATE, FERRITIN, TIBC, IRON, RETICCTPCT in the last 72 hours. Urine analysis:    Component Value Date/Time   COLORURINE YELLOW 06/10/2020 1730   APPEARANCEUR HAZY (A) 06/10/2020 1730   LABSPEC 1.010 06/10/2020 1730   PHURINE 6.0 06/10/2020 1730   GLUCOSEU NEGATIVE 06/10/2020 1730    HGBUR NEGATIVE 06/10/2020 1730   BILIRUBINUR NEGATIVE 06/10/2020 1730   KETONESUR NEGATIVE 06/10/2020 1730   PROTEINUR NEGATIVE 06/10/2020 1730   UROBILINOGEN 0.2 04/18/2010 1851   NITRITE POSITIVE (A) 06/10/2020 1730   LEUKOCYTESUR LARGE (A) 06/10/2020 1730     Meridee Branum M.D. [facility] 06/17/2020, 12:50 PM   Call night coverage person covering after 7pm

## 2020-06-17 NOTE — Progress Notes (Signed)
ANTICOAGULATION CONSULT NOTE - Follow Up Consult  Pharmacy Consult for Apixaban Indication: pulmonary embolus  No Known Allergies  Patient Measurements: Height: 6' (182.9 cm) Weight: 59.1 kg (130 lb 4.7 oz) IBW/kg (Calculated) : 77.6  Vital Signs: Temp: 98.1 F (36.7 C) (12/15 0535) Temp Source: Oral (12/15 0535) BP: 107/68 (12/15 0535) Pulse Rate: 87 (12/15 0535)  Labs: Recent Labs    06/16/20 1233 06/16/20 1643 06/17/20 0433  HGB 8.6* 8.2* 8.5*  HCT 27.7* 26.3* 26.1*  PLT 676* 667* 650*  CREATININE 0.88  --  0.95    Estimated Creatinine Clearance: 62.2 mL/min (by C-G formula based on SCr of 0.95 mg/dL).  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  Subsequently patient had a GI bleed.  Cleared by GI to restart apixaban. Pharmacy consulted to reinitiate.  Given recent GI bleed and prior load with Apixaban will start 5 mg po bid.   Plan:  Restart Apixaban 5 mg po bid Monitor for signs and symptoms of bleeding.  Jeanella Cara, PharmD, [facility] Clinical Pharmacist Please see AMION for all Pharmacists' Contact Phone Numbers 06/17/2020, 1:21 PM

## 2020-06-18 ENCOUNTER — Telehealth: Payer: Self-pay | Admitting: Gastroenterology

## 2020-06-18 DIAGNOSIS — E43 Unspecified severe protein-calorie malnutrition: Secondary | ICD-10-CM | POA: Insufficient documentation

## 2020-06-18 DIAGNOSIS — R531 Weakness: Secondary | ICD-10-CM

## 2020-06-18 LAB — BASIC METABOLIC PANEL
Anion gap: 9 (ref 5–15)
BUN: 11 mg/dL (ref 8–23)
CO2: 25 mmol/L (ref 22–32)
Calcium: 8.5 mg/dL — ABNORMAL LOW (ref 8.9–10.3)
Chloride: 97 mmol/L — ABNORMAL LOW (ref 98–111)
Creatinine, Ser: 0.99 mg/dL (ref 0.61–1.24)
GFR, Estimated: >60 mL/min (ref >60)
Glucose, Bld: 107 mg/dL — ABNORMAL HIGH (ref 70–99)
Potassium: 4.2 mmol/L (ref 3.5–5.1)
Sodium: 131 mmol/L — ABNORMAL LOW (ref 135–145)

## 2020-06-18 LAB — CBC
HCT: 26.4 % — ABNORMAL LOW (ref 39.0–52.0)
Hemoglobin: 8.3 g/dL — ABNORMAL LOW (ref 13.0–17.0)
MCH: 27.4 pg (ref 26.0–34.0)
MCHC: 31.4 g/dL (ref 30.0–36.0)
MCV: 87.1 fL (ref 80.0–100.0)
Platelets: 674 10*3/uL — ABNORMAL HIGH (ref 150–400)
RBC: 3.03 MIL/uL — ABNORMAL LOW (ref 4.22–5.81)
RDW: 14.7 % (ref 11.5–15.5)
WBC: 11.6 10*3/uL — ABNORMAL HIGH (ref 4.0–10.5)
nRBC: 0 % (ref 0.0–0.2)

## 2020-06-18 NOTE — Telephone Encounter (Signed)
Bluford Main RN from [facility] is requesting for someone to contact the pt back since he was admitted to the hospital and Dr Lavon Paganini saw him. Caller would like a call back to be done to the daughter.  CB [phone]

## 2020-06-18 NOTE — Progress Notes (Signed)
PROGRESS NOTE    Dwayne Barnes  ZOX:[id number] DOB: [birth year] DOA: 05/20/2020 PCP: [name], PA-C   Brief Narrative:   The patient is a69 y.o.malewith no significant past medical history-presented to the ED on 11/17 with nausea/vomiting, weakness and back pain-further evaluation revealed COVID-19 infection and a large PE. Patient was started on IV heparin infusion along with steroid/Remdesivir-on 11/19-patient had a cardiac arrest-was intubated during the code-total downtime of around 6 minutes before ROSC. Patient was subsequently transferred to the ICU-unfortunately soon after arrival in the ICU-he had another cardiac arrest-and 4 rounds of CPR were provided with ROSC. Patient was provided supportive care-he subsequently improved-self extubated on 11/23-he was [facility] on 11/25. Further hospital course complicated by diarrhea secondary to C. difficile along with AKI and hyponatremia.See below for further details.  COVID-19 vaccination status: Vaccinated Significant Events: 11/17>> [facility]/COVID-19 infection 11/19>> PEA cardiac arrest x 2-given TPA-intubated-transfer to ICU 11/23>> Self extubated 11/25>> transfer to [facility] 11/29>> worsening diarrhea-CT abdomen with progressive colitis 12/14>> GI Bleeding noted so GI was consulted and recommending continue Eliquis at this time and if patient continues to have persistent hematochezia they are planning a colonoscopy for further evaluation; they felt that the bleeding was from hemorrhoids  Significant studies: 11/18>>CT renal stone: non specific colitis, GGO's, left nephrolithiasis without obstructive uropathy. 11/18>>CTA chest: large PC in LLL, GGO's LLL. 11/20 echo>> EF 50-55%, RV systolic function severely reduced. 11/21>> B/L extremity Doppler: acute DVTin left femoral vein, acute mobile thrombus in the proximal femoral vein. 11/23>>CT Head: unremarkable 11/29>> CT abdomen/pelvis>>  progressive colitis  COVID-19 medications: Steroids: 11/17>>11/26 Remdesivir:11/17>>11/22  His diarrhea is improving but patient had a bloody bowel movement a few days ago so GI was consulted.  PT OT still recommending skilled nursing facility.  Will need to continue monitor his hemoglobin/hematocrit and evaluate for further signs and symptoms of bleeding.  Assessment & Plan:   Principal Problem:   COVID-19 virus infection Active Problems:   Generalized weakness   Acute pulmonary embolism (HCC)   Hematochezia   Protein-calorie malnutrition, severe  Cardiac Arrest due to large acute PE and DVT -PE likely provoked by COVID-19 infection -Status post TPA, was on therapeutic Lovenox, transitioned to Eliquis, then heparin, now has IVC filter  -PCCM MD discussed with Dr. Gala Romney, no indication for any acute intervention. -2D echo showed EF of 50 to 55%, RV systolic function severely reduced -Continue to Monitor carefully  Large PE with DVT - s/p TPA on 11/19, when he had cardiac arrest.  -S/p IVC filter given mobile thrombus seen on the lower extremity Doppler.   -He was then transitioned to Eliquis, subsequently held due to GI bleed on 06/16/20; This was resumed on 06/17/2020 at the recommendations of GI and is having minimal bleeding and small volume blood  Acute hypoxic respiratory failure, due to PE/cardiac arrest/COVID-19 pneumonia -Currently stable on room air, self extubated on 11/23 -Has completed a course of penicillin, steroids.   -Hypoxia resolved, currently on room air  -We will need an ambulatory home O2 screen prior to discharge  GI bleed, noted on 06/15/2020 -Patient was on anticoagulation for PE, had on 12/14, has IVC filter -GI was consulted.  Seen by Dr. Lavon Paganini, recommended to continue eliquis, if has persistent hematochezia then will plan colonoscopy for evaluation -H&H stable yesterday 8.5/, will resume Eliquis 06/18/2019 1 in the afternoon and monitor  H&H or any bleeding -Recommended Anusol suppository twice daily, Metamucil -Had minimal bleeding today  and continues to have some mucoid blood -Need to monitor closely to see if he continues to have hematochezia otherwise we will plan for colonoscopy -Continue with Anusol suppositories  Acute metabolic encephalopathy, ICU delirium, possible anoxic injury -Much more alert and oriented today, CT head without acute changes. -Minimize sedating medications, continue supportive care  Dysphagia -In the setting of cardiac arrest, seen by SLP, required NG tube -Diet gradually upgraded to dysphagia 3 diet -Continue with aspiration precautions  Hepatitis A -Serologies were positive Hep A Ab was Positive  -Continue supportive care  Leukocytosis -Mild and likely reactive -Patient's WBC went from 10.1 and is elevated to 11.6 today -Continue to monitor for signs and symptoms of infection; currently getting antibiotics for C. difficile colitis -Repeat CMP in the AM  C. Difficile Colitis:  -Diarrhea improving, AKI resolved, leukocytosis improved.   -Continue oral vancomycin; having some minimal bloody stools with some mucus -Already on p.o. vancomycin 125 mg every 6 hours for 14 days  AKI:  -Likely due to diarrhea, #1,  -Resolved with IV fluids -Last BUN/creatinine is 11/0.99 -Avoid nephrotoxic medications, contrast dyes, hypotension and renally dose medications -Repeat CMP in a.m.  Thrombocytosis -Likely Reactive in the setting of Above -Patient's Platelet Count went from 808 -> 674 -> 676 -> 667 -> 650 -> 674 -Continue to Monitor and Trend -Repeat CBC in the AM   Paroxysmal atrial fibrillation -Back in NSR, -Patient back on anticoagulation given GI recommendations -2D echo as above, rate controlled  Normocytic Anemia -Secondary to critical illness, H&H currently stable -Last hemoglobin/hematocrit dropped from 8.5/26.1 and is now 8.3/26.4  -Has had some minor bleeding  yesterday and today likely from hemorrhoids but GI recommending continuing Eliquis -GI recommending continue Anusol suppositories twice a day for 5 to 7 days continue Eliquis  Transaminitis/Abnormal LFTs -Likely secondary to COVID-19, down-trending -Also could be in the setting of hepatitis A -Last check on 12/12 showed an AST of 42 and ALT of 68 -Continue to Monitor and Trend -Repeat CMP in the AM   Generalized debility, deconditioning -PT OT evaluation completed, recommendations for SNF -SNF requires stools to be formed and solidified; Will discharge when medically stable -Had some Clear Mucous with Red Streaks   Hard of hearing  -C/w supportive care.  Hyponatremia  -Urine osmolality 311, serum osmolality 270, points towards SIADH however hypotension had required IV fluids.  Also drinks more than 5-6 bottles of soda every day -Currently alert and awake, no acute issues, NA 133 yesterday and trended down to 131  Underweight, Severe protein calorie malnutrition, albumin 1.5 on 12/12 -Nutritionist is consulted for further evaluation recommendations -Estimated body mass index is 17.67 kg/m as calculated from the following:   Height as of this encounter: 6' (1.829 m).   Weight as of this encounter: 59.1 kg. -Continue with with Ensure Enlive p.o. 3 times daily as well as multivitamin with minerals daily  DVT prophylaxis: Anticoagulated with Eliquis Code Status: FULL CODE  Family Communication: Discussed with Daughter over the Phone Disposition Plan: Pending further clinical improvement and stabilization of his GI bleeding; GI signed off the case but will need to continue monitor for hematochezia; his stools need to be formed prior to discharge to SNF and will need an ambulatory home O2 screen prior to discharge  Status is: Inpatient  Remains inpatient appropriate because:Unsafe d/c plan, IV treatments appropriate due to intensity of illness or inability to take PO and Inpatient  level of care appropriate due to severity of illness  Dispo: The patient is from: Home              Anticipated d/c is to: SNF              Anticipated d/c date is: 2 days              Patient currently is not medically stable to d/c.  Consultants:   PCCM Transfer  Gastroenterology   Procedures:   Antimicrobials:  Anti-infectives (From admission, onward)   Start     Dose/Rate Route Frequency Ordered Stop   06/14/20 0000  vancomycin (VANCOCIN) 50 mg/mL oral solution        125 mg Oral 4 times daily 06/14/20 1011 06/19/20 2359   06/13/20 1200  vancomycin (VANCOCIN) 50 mg/mL oral solution 125 mg        125 mg Oral Every 6 hours 06/13/20 0933     06/10/20 2230  vancomycin (VANCOREADY) IVPB 1250 mg/250 mL        1,250 mg 166.7 mL/hr over 90 Minutes Intravenous  Once 06/10/20 2139 06/11/20 0140   06/10/20 2230  cephALEXin (KEFLEX) capsule 500 mg        500 mg Oral  Once 06/10/20 2139 06/10/20 2155   06/10/20 0000  vancomycin (VANCOCIN) 50 mg/mL oral solution  Status:  Discontinued        125 mg Oral 4 times daily 06/10/20 1053 06/14/20    06/02/20 1000  vancomycin (VANCOCIN) 50 mg/mL oral solution 125 mg        125 mg Oral 4 times daily 06/02/20 0800 06/11/20 2158   05/22/20 1000  remdesivir 100 mg in sodium chloride 0.9 % 100 mL IVPB       "Followed by" Linked Group Details   100 mg 200 mL/hr over 30 Minutes Intravenous Daily 05/21/20 0857 05/25/20 1154   05/21/20 1030  remdesivir 200 mg in sodium chloride 0.9% 250 mL IVPB  Status:  Discontinued       "Followed by" Linked Group Details   200 mg 580 mL/hr over 30 Minutes Intravenous Once 05/21/20 0857 05/27/20 0734   05/21/20 0330  metroNIDAZOLE (FLAGYL) tablet 500 mg        500 mg Oral  Once 05/21/20 0320 05/21/20 0411   05/20/20 2330  cefTRIAXone (ROCEPHIN) 2 g in sodium chloride 0.9 % 100 mL IVPB        2 g 200 mL/hr over 30 Minutes Intravenous  Once 05/20/20 2324 05/21/20 0331        Subjective: Seen and examined at  bedside and he is extremely hard of hearing.  Denies any pain though.  No nausea or vomiting.  Thinks he is doing okay.  Watching the television.  No other concerns or complaints at this time and had another bowel movement and had some minimal mucousy blood per nursing.  Objective: Vitals:   06/17/20 0535 06/17/20 1520 06/17/20 2100 06/17/20 2330  BP: 107/68 105/64 108/71   Pulse: 87 78 85   Resp: 18 20 20 16   Temp: 98.1 F (36.7 C) 98.9 F (37.2 C) 98.7 F (37.1 C)   TempSrc: Oral Oral Oral   SpO2: 97% 99% 98%   Weight:      Height:        Intake/Output Summary (Last 24 hours) at 06/18/2020 0749 Last data filed at 06/17/2020 2330 Gross per 24 hour  Intake --  Output 800 ml  Net -800 ml   Filed Weights   06/15/20 2110 06/16/20 0520 06/17/20  0056  Weight: 59.4 kg 59.4 kg 59.1 kg   Examination: Physical Exam:  Constitutional: Thin frail chronically ill-appearing Caucasian male currently in acute distress appears calm watching television Eyes: Lids and conjunctivae normal, sclerae anicteric  ENMT: External Ears, Nose appear normal.  Patient extremely hard of hearing Neck: Appears normal, supple, no cervical masses, normal ROM, no appreciable thyromegaly Respiratory: Diminished to auscultation bilaterally, no wheezing, rales, rhonchi or crackles. Normal respiratory effort and patient is not tachypenic. No accessory muscle use.  Unlabored breathing Cardiovascular: RRR, no murmurs / rubs / gallops. S1 and S2 auscultated.  Minimal extremity edema Abdomen: Soft, non-tender, non-distended.  Bowel sounds positive.  GU: Deferred. Musculoskeletal: No clubbing / cyanosis of digits/nails. No joint deformity upper and lower extremities.  Skin: No rashes, lesions, ulcers on limited skin evaluation. No induration; Warm and dry.  Neurologic: CN 2-12 grossly intact with no focal deficits but that he is extremely hard of hearing. Romberg sign and cerebellar reflexes not assessed.  Psychiatric:  Normal judgment and insight.  Patient is awake and alert and oriented x 3. Normal mood and appropriate affect.   Data Reviewed: I have personally reviewed following labs and imaging studies  CBC: Recent Labs  Lab 06/12/20 0127 06/13/20 0257 06/14/20 0523 06/15/20 1348 06/16/20 0623 06/16/20 1233 06/16/20 1643 06/17/20 0433 06/18/20 0025  WBC 8.9 9.5 10.0   < > 9.8 10.0 10.4 10.1 11.6*  NEUTROABS 6.9 7.3 8.0*  --   --   --   --   --   --   HGB 8.3* 8.7* 8.6*   < > 8.4* 8.6* 8.2* 8.5* 8.3*  HCT 25.9* 26.2* 26.2*   < > 26.9* 27.7* 26.3* 26.1* 26.4*  MCV 86.6 87.0 87.0   < > 87.9 88.2 88.0 86.4 87.1  PLT 551* 624* 651*   < > 673* 676* 667* 650* 674*   < > = values in this interval not displayed.   Basic Metabolic Panel: Recent Labs  Lab 06/12/20 0127 06/13/20 0257 06/14/20 0523 06/16/20 1233 06/17/20 0433 06/18/20 0025  NA 130* 132* 132* 134* 133* 131*  K 4.0 4.3 4.4 4.5 4.2 4.2  CL 98 98 96* 96* 97* 97*  CO2 23 26 27 27 26 25   GLUCOSE 97 94 105* 96 99 107*  BUN 8 9 10  7* 10 11  CREATININE 0.87 0.88 0.86 0.88 0.95 0.99  CALCIUM 8.1* 8.3* 8.4* 8.5* 8.5* 8.5*  MG 2.0 2.0 2.0  --   --   --    GFR: Estimated Creatinine Clearance: 59.7 mL/min (by C-G formula based on SCr of 0.99 mg/dL). Liver Function Tests: Recent Labs  Lab 06/12/20 0127 06/13/20 0257 06/14/20 0523  AST 38 74* 42*  ALT 56* 85* 68*  ALKPHOS 224* 270* 255*  BILITOT 0.8 0.6 0.7  PROT 5.0* 5.2* 5.3*  ALBUMIN 1.4* 1.4* 1.5*   No results for input(s): LIPASE, AMYLASE in the last 168 hours. No results for input(s): AMMONIA in the last 168 hours. Coagulation Profile: No results for input(s): INR, PROTIME in the last 168 hours. Cardiac Enzymes: No results for input(s): CKTOTAL, CKMB, CKMBINDEX, TROPONINI in the last 168 hours. BNP (last 3 results) No results for input(s): PROBNP in the last 8760 hours. HbA1C: No results for input(s): HGBA1C in the last 72 hours. CBG: Recent Labs  Lab  06/12/20 2118 06/13/20 0757 06/13/20 1236 06/13/20 1717 06/13/20 2100  GLUCAP 108* 108* 122* 117* 107*   Lipid Profile: No results for input(s): CHOL, HDL,  LDLCALC, TRIG, CHOLHDL, LDLDIRECT in the last 72 hours. Thyroid Function Tests: No results for input(s): TSH, T4TOTAL, FREET4, T3FREE, THYROIDAB in the last 72 hours. Anemia Panel: No results for input(s): VITAMINB12, FOLATE, FERRITIN, TIBC, IRON, RETICCTPCT in the last 72 hours. Sepsis Labs: Recent Labs  Lab 06/12/20 0127 06/13/20 0257 06/14/20 0523 06/15/20 0203  PROCALCITON 0.12 0.16 0.10 <0.10    Recent Results (from the past 240 hour(s))  Culture, blood (routine x 2)     Status: None   Collection Time: 06/10/20  3:35 PM   Specimen: BLOOD  Result Value Ref Range Status   Specimen Description BLOOD LEFT ANTECUBITAL  Final   Special Requests   Final    BOTTLES DRAWN AEROBIC ONLY Blood Culture adequate volume   Culture   Final    NO GROWTH 5 DAYS Performed at [facility], [address], [place], [place] [address]    Report Status 06/15/2020 FINAL  Final  Culture, blood (routine x 2)     Status: None   Collection Time: 06/10/20  3:46 PM   Specimen: BLOOD LEFT ARM  Result Value Ref Range Status   Specimen Description BLOOD LEFT ARM  Final   Special Requests   Final    BOTTLES DRAWN AEROBIC ONLY Blood Culture adequate volume   Culture   Final    NO GROWTH 5 DAYS Performed at [facility], [address] [place], [place] [address]    Report Status 06/15/2020 FINAL  Final  MRSA PCR Screening     Status: Abnormal   Collection Time: 06/10/20  4:27 PM   Specimen: Nasal Mucosa; Nasopharyngeal  Result Value Ref Range Status   MRSA by PCR POSITIVE (A) NEGATIVE Final    Comment:        The GeneXpert MRSA Assay (FDA approved for NASAL specimens only), is one component of a comprehensive MRSA colonization surveillance program. It is not intended to diagnose MRSA infection nor to guide or monitor  treatment for MRSA infections. RESULT CALLED TO, READ BACK BY AND VERIFIED WITH: Kerrin Champagne RN 06/10/20 at 1943 sk  Performed at [facility], [address], [place], [place] [address]      RN Pressure Injury Documentation:     Estimated body mass index is 17.67 kg/m as calculated from the following:   Height as of this encounter: 6' (1.829 m).   Weight as of this encounter: 59.1 kg.  Malnutrition Type:  Nutrition Problem: Severe Malnutrition Etiology: acute illness (COVID 19 infection)  Malnutrition Characteristics:  Signs/Symptoms: severe fat depletion,severe muscle depletion  Nutrition Interventions:  Interventions: Ensure Enlive (each supplement provides 350kcal and 20 grams of protein)   Radiology Studies: No results found.  Scheduled Meds:  apixaban  5 mg Oral BID   famotidine  20 mg Oral Daily   feeding supplement  237 mL Oral TID BM   hydrocortisone  25 mg Rectal BID   multivitamin with minerals  1 tablet Oral Daily   psyllium  1 packet Oral Daily   sodium chloride flush  10-40 mL Intracatheter Q12H   vancomycin  125 mg Oral Q6H   Continuous Infusions:   LOS: 28 days   Merlene Laughter, DO [facility] PAGER is on AMION  If 7PM-7AM, please contact night-coverage [email or web address]

## 2020-06-18 NOTE — Progress Notes (Signed)
Occupational Therapy Treatment Patient Details Name: Dwayne Barnes MRN: [id number] DOB: [birth year] Today's Date: 06/18/2020    History of present illness Pt is a 69 y.o. male admitted 05/20/20 with back pain, weakness and colitis; found to have (+) COVID-19 and LLL PE. Pt with 2x cardiac arrests on 11/19 and intubated; self-exubated 11/22. 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 Head CT 11/23 unremarkable. S/p femoral line removal 11/24. Course complicated by progressive diarrhea; abdominal CT 11/29 with progressive colitis. PMH includes HOH.   OT comments  Patient overall min A for standing activity due to unsteadiness and for safety, patient mildly impulsive standing before OT ready. Patient participate in bathing at EOB, supervision for UB and min A to complete LB/peri area in standing. Continue to recommend acute OT to maximize patient safety, activity tolerance necessary for independence with self care.   Follow Up Recommendations  SNF    Equipment Recommendations  3 in 1 bedside commode       Precautions / Restrictions Precautions Precautions: Fall Precaution Comments: Extremely HOH       Mobility Bed Mobility Overal bed mobility: Needs Assistance Bed Mobility: Supine to Sit;Sit to Supine     Supine to sit: Supervision;HOB elevated Sit to supine: Supervision      Transfers Overall transfer level: Needs assistance Equipment used: Rolling walker (2 wheeled) Transfers: Sit to/from Stand Sit to Stand: Min guard         General transfer comment: please see toilet transfer in ADL section    Balance Overall balance assessment: Needs assistance Sitting-balance support: Feet supported Sitting balance-Leahy Scale: Good     Standing balance support: Bilateral upper extremity supported Standing balance-Leahy Scale: Poor Standing balance comment: reliant on external support                           ADL either performed or assessed with  clinical judgement   ADL Overall ADL's : Needs assistance/impaired         Upper Body Bathing: Supervision/ safety;Sitting   Lower Body Bathing: Minimal assistance;Sit to/from stand;Sitting/lateral leans Lower Body Bathing Details (indicate cue type and reason): patient able to wash LEs, assist to wash buttock in standing due to decreased dynamic standing balance and B UE support of walkre         Toilet Transfer: Min guard;Ambulation;RW;Minimal assistance Toilet Transfer Details (indicate cue type and reason): min G to power up to standing, min A for ambulation in room ~14ft for safety due to mild unsteadiness Toileting- Clothing Manipulation and Hygiene: Total assistance;Sit to/from stand       Functional mobility during ADLs: Minimal assistance;Rolling walker                 Cognition Arousal/Alertness: Awake/alert Behavior During Therapy: WFL for tasks assessed/performed Overall Cognitive Status: Within Functional Limits for tasks assessed                                 General Comments: slightly impulsive                   Pertinent Vitals/ Pain       Pain Assessment: Faces Faces Pain Scale: No hurt         Frequency  Min 2X/week        Progress Toward Goals  OT Goals(current goals can now be found in the care  plan section)  Progress towards OT goals: Progressing toward goals  Acute Rehab OT Goals Patient Stated Goal: to get stronger OT Goal Formulation: With patient Time For Goal Achievement: 06/30/20 Potential to Achieve Goals: Good ADL Goals Pt Will Perform Grooming: with modified independence;standing Pt Will Perform Lower Body Bathing: with modified independence;sit to/from stand Pt Will Perform Lower Body Dressing: with modified independence;sit to/from stand Pt Will Transfer to Toilet: with modified independence;ambulating Additional ADL Goal #1: Pt will complete x3 standing ADL activities with min A  Plan Discharge  plan remains appropriate       AM-PAC OT "6 Clicks" Daily Activity     Outcome Measure   Help from another person eating meals?: A Little Help from another person taking care of personal grooming?: A Little Help from another person toileting, which includes using toliet, bedpan, or urinal?: A Lot Help from another person bathing (including washing, rinsing, drying)?: A Little Help from another person to put on and taking off regular upper body clothing?: A Little Help from another person to put on and taking off regular lower body clothing?: A Lot 6 Click Score: 16    End of Session Equipment Utilized During Treatment: Rolling walker  OT Visit Diagnosis: Unsteadiness on feet (R26.81);Other abnormalities of gait and mobility (R26.89);Muscle weakness (generalized) (M62.81)   Activity Tolerance Patient tolerated treatment well   Patient Left in bed;with call bell/phone within reach;with bed alarm set   Nurse Communication Other (comment) (RN present for session)        Time: 9373-4287 OT Time Calculation (min): 31 min  Charges: OT General Charges $OT Visit: 1 Visit OT Treatments $Self Care/Home Management : 23-37 mins  Marlyce Huge OT OT pager: [phone]   Carmelia Roller 06/18/2020, 12:01 PM

## 2020-06-18 NOTE — Progress Notes (Signed)
Physical Therapy Treatment Patient Details Name: Dwayne Barnes MRN: [id number] DOB: December 05, 1950 Today's Date: 06/18/2020    History of Present Illness Pt is a 69 y.o. male admitted 05/20/20 with back pain, weakness and colitis; found to have (+) COVID-19 and LLL PE. Pt with 2x cardiac arrests on 11/19 and intubated; self-exubated 11/22. 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 Head CT 11/23 unremarkable. S/p femoral line removal 11/24. Course complicated by progressive diarrhea; abdominal CT 11/29 with progressive colitis. PMH includes HOH.    PT Comments    Pt needed a bit of encouragement today to participate as he is still having issues with his bowels (bed pan in bed next to him due to frequent stools).  Pt was worried he would leak in the hallway, so we remained in room for gait with RW.  He continues to have increased WOB, but stable sats as well as productive sounding coughing bouts during mobility.  He fatigues easily and did not want to get up to the chair.  PT will continue to follow acutely for safe mobility progression.   Follow Up Recommendations  SNF     Equipment Recommendations  3in1 (PT);Rolling walker with 5" wheels    Recommendations for Other Services       Precautions / Restrictions Precautions Precautions: Fall Precaution Comments: Extremely HOH use dry erase board in room.    Mobility  Bed Mobility Overal bed mobility: Needs Assistance Bed Mobility: Supine to Sit;Sit to Supine     Supine to sit: Supervision Sit to supine: Supervision   General bed mobility comments: supervision for safety, HOB elevated  Transfers Overall transfer level: Needs assistance Equipment used: Rolling walker (2 wheeled) Transfers: Sit to/from Stand Sit to Stand: Min guard         General transfer comment: Min guard assist for safety and balance.  Ambulation/Gait Ambulation/Gait assistance: Min guard Gait Distance (Feet): 35 Feet Assistive device: Rolling  walker (2 wheeled) Gait Pattern/deviations: Step-through pattern;Staggering left;Staggering right     General Gait Details: Pt mildly unsteady even with RW use today.  Self reports feeling weak because of the bowl issues.  He continues to have increased WOB without drop in O2 sats, but started coughing fits with me with DOE 2-3/4 on RA during gait.   Stairs             Wheelchair Mobility    Modified Rankin (Stroke Patients Only)       Balance Overall balance assessment: Needs assistance Sitting-balance support: Feet supported;Bilateral upper extremity supported Sitting balance-Leahy Scale: Good     Standing balance support: Bilateral upper extremity supported Standing balance-Leahy Scale: Poor Standing balance comment: reliant on external support                            Cognition Arousal/Alertness: Awake/alert Behavior During Therapy: WFL for tasks assessed/performed Overall Cognitive Status: Within Functional Limits for tasks assessed                                        Exercises      General Comments General comments (skin integrity, edema, etc.): Pt fatigued with bedside commode resting next to him in bed.  reluctant to get up with poor energy and fatigue.  Pt ultimately agreeable to gait only, but could benefit from seated HEP, IS and flutter  valve use, etc.      Pertinent Vitals/Pain Pain Assessment: No/denies pain    Home Living                      Prior Function            PT Goals (current goals can now be found in the care plan section) Acute Rehab PT Goals Patient Stated Goal: to get stronger Progress towards PT goals: Progressing toward goals    Frequency    Min 2X/week      PT Plan Current plan remains appropriate    Co-evaluation              AM-PAC PT "6 Clicks" Mobility   Outcome Measure  Help needed turning from your back to your side while in a flat bed without using  bedrails?: A Little Help needed moving from lying on your back to sitting on the side of a flat bed without using bedrails?: A Little Help needed moving to and from a bed to a chair (including a wheelchair)?: A Little Help needed standing up from a chair using your arms (e.g., wheelchair or bedside chair)?: A Little Help needed to walk in hospital room?: A Little Help needed climbing 3-5 steps with a railing? : A Little 6 Click Score: 18    End of Session Equipment Utilized During Treatment: Gait belt Activity Tolerance: Patient limited by fatigue Patient left: in bed;with call bell/phone within reach;with bed alarm set   PT Visit Diagnosis: Muscle weakness (generalized) (M62.81);Difficulty in walking, not elsewhere classified (R26.2)     Time: 4431-5400 PT Time Calculation (min) (ACUTE ONLY): 17 min  Charges:  $Gait Training: 8-22 mins                     Corinna Capra, PT, DPT  Acute Rehabilitation [phone] pager [phone]) [phone] office

## 2020-06-18 NOTE — Plan of Care (Signed)
  Problem: Education: Goal: Knowledge of risk factors and measures for prevention of condition will improve Outcome: Progressing   

## 2020-06-18 NOTE — Plan of Care (Signed)
  Problem: Education: Goal: Knowledge of risk factors and measures for prevention of condition will improve Outcome: Progressing   Problem: Coping: Goal: Psychosocial and spiritual needs will be supported Outcome: Progressing   Problem: Respiratory: Goal: Will maintain a patent airway Outcome: Progressing Goal: Complications related to the disease process, condition or treatment will be avoided or minimized Outcome: Progressing   Problem: Activity: Goal: Ability to tolerate increased activity will improve Outcome: Progressing   Problem: Clinical Measurements: Goal: Ability to maintain a body temperature in the normal range will improve Outcome: Progressing   Problem: Respiratory: Goal: Ability to maintain adequate ventilation will improve Outcome: Progressing Goal: Ability to maintain a clear airway will improve Outcome: Progressing   Problem: Education: Goal: Knowledge of General Education information will improve Description: Including pain rating scale, medication(s)/side effects and non-pharmacologic comfort measures Outcome: Progressing   Problem: Health Behavior/Discharge Planning: Goal: Ability to manage health-related needs will improve Outcome: Progressing   Problem: Clinical Measurements: Goal: Ability to maintain clinical measurements within normal limits will improve Outcome: Progressing Goal: Will remain free from infection Outcome: Progressing Goal: Diagnostic test results will improve Outcome: Progressing Goal: Respiratory complications will improve Outcome: Progressing Goal: Cardiovascular complication will be avoided Outcome: Progressing   Problem: Activity: Goal: Risk for activity intolerance will decrease Outcome: Progressing   Problem: Nutrition: Goal: Adequate nutrition will be maintained Outcome: Progressing   Problem: Coping: Goal: Level of anxiety will decrease Outcome: Progressing   Problem: Elimination: Goal: Will not experience  complications related to bowel motility Outcome: Progressing Goal: Will not experience complications related to urinary retention Outcome: Progressing   Problem: Pain Managment: Goal: General experience of comfort will improve Outcome: Progressing   Problem: Safety: Goal: Ability to remain free from injury will improve Outcome: Progressing   Problem: Skin Integrity: Goal: Risk for impaired skin integrity will decrease Outcome: Progressing   

## 2020-06-19 LAB — COMPREHENSIVE METABOLIC PANEL
ALT: 67 U/L — ABNORMAL HIGH (ref 0–44)
AST: 56 U/L — ABNORMAL HIGH (ref 15–41)
Albumin: 1.6 g/dL — ABNORMAL LOW (ref 3.5–5.0)
Alkaline Phosphatase: 223 U/L — ABNORMAL HIGH (ref 38–126)
Anion gap: 9 (ref 5–15)
BUN: 14 mg/dL (ref 8–23)
CO2: 26 mmol/L (ref 22–32)
Calcium: 8.4 mg/dL — ABNORMAL LOW (ref 8.9–10.3)
Chloride: 96 mmol/L — ABNORMAL LOW (ref 98–111)
Creatinine, Ser: 1.05 mg/dL (ref 0.61–1.24)
GFR, Estimated: >60 mL/min (ref >60)
Glucose, Bld: 101 mg/dL — ABNORMAL HIGH (ref 70–99)
Potassium: 4.1 mmol/L (ref 3.5–5.1)
Sodium: 131 mmol/L — ABNORMAL LOW (ref 135–145)
Total Bilirubin: 0.5 mg/dL (ref 0.3–1.2)
Total Protein: 5.5 g/dL — ABNORMAL LOW (ref 6.5–8.1)

## 2020-06-19 LAB — CBC WITH DIFFERENTIAL/PLATELET
Abs Immature Granulocytes: 0.29 10*3/uL — ABNORMAL HIGH (ref 0.00–0.07)
Basophils Absolute: 0.1 10*3/uL (ref 0.0–0.1)
Basophils Relative: 1 %
Eosinophils Absolute: 0 10*3/uL (ref 0.0–0.5)
Eosinophils Relative: 0 %
HCT: 26.6 % — ABNORMAL LOW (ref 39.0–52.0)
Hemoglobin: 8.3 g/dL — ABNORMAL LOW (ref 13.0–17.0)
Immature Granulocytes: 3 %
Lymphocytes Relative: 16 %
Lymphs Abs: 1.7 10*3/uL (ref 0.7–4.0)
MCH: 27.1 pg (ref 26.0–34.0)
MCHC: 31.2 g/dL (ref 30.0–36.0)
MCV: 86.9 fL (ref 80.0–100.0)
Monocytes Absolute: 0.7 10*3/uL (ref 0.1–1.0)
Monocytes Relative: 7 %
Neutro Abs: 8 10*3/uL — ABNORMAL HIGH (ref 1.7–7.7)
Neutrophils Relative %: 73 %
Platelets: 633 10*3/uL — ABNORMAL HIGH (ref 150–400)
RBC: 3.06 MIL/uL — ABNORMAL LOW (ref 4.22–5.81)
RDW: 14.7 % (ref 11.5–15.5)
WBC: 10.8 10*3/uL — ABNORMAL HIGH (ref 4.0–10.5)
nRBC: 0 % (ref 0.0–0.2)

## 2020-06-19 LAB — MAGNESIUM: Magnesium: 2 mg/dL (ref 1.7–2.4)

## 2020-06-19 LAB — RESP PANEL BY RT-PCR (FLU A&B, COVID) ARPGX2
Influenza A by PCR: NEGATIVE
Influenza B by PCR: NEGATIVE
SARS Coronavirus 2 by RT PCR: NEGATIVE

## 2020-06-19 LAB — C-REACTIVE PROTEIN: CRP: 9.1 mg/dL — ABNORMAL HIGH (ref <1.0)

## 2020-06-19 LAB — PHOSPHORUS: Phosphorus: 3.6 mg/dL (ref 2.5–4.6)

## 2020-06-19 NOTE — Progress Notes (Signed)
PROGRESS NOTE    Dwayne Barnes  GDJ:[id number] DOB: 12/27/1950 DOA: 05/20/2020 PCP: [name], PA-C   Brief Narrative:   The patient is a69 y.o.malewith no significant past medical history-presented to the ED on 11/17 with nausea/vomiting, weakness and back pain-further evaluation revealed COVID-19 infection and a large PE. Patient was started on IV heparin infusion along with steroid/Remdesivir-on 11/19-patient had a cardiac arrest-was intubated during the code-total downtime of around 6 minutes before ROSC. Patient was subsequently transferred to the ICU-unfortunately soon after arrival in the ICU-he had another cardiac arrest-and 4 rounds of CPR were provided with ROSC. Patient was provided supportive care-he subsequently improved-self extubated on 11/23-he was [facility] to [facility] on 11/25. Further hospital course complicated by diarrhea secondary to C. difficile along with AKI and hyponatremia.See below for further details.  COVID-19 vaccination status: Vaccinated Significant Events: 11/17>> Admit to [facility] for PE/COVID-19 infection 11/19>> PEA cardiac arrest x 2-given TPA-intubated-transfer to ICU 11/23>> Self extubated 11/25>> transfer to [facility] 11/29>> worsening diarrhea-CT abdomen with progressive colitis 12/14>> GI Bleeding noted so GI was consulted and recommending continue Eliquis at this time and if patient continues to have persistent hematochezia they are planning a colonoscopy for further evaluation; they felt that the bleeding was from hemorrhoids  Significant studies: 11/18>>CT renal stone: non specific colitis, GGO's, left nephrolithiasis without obstructive uropathy. 11/18>>CTA chest: large PC in LLL, GGO's LLL. 11/20 echo>> EF 50-55%, RV systolic function severely reduced. 11/21>> B/L extremity Doppler: acute DVTin left femoral vein, acute mobile thrombus in the proximal femoral vein. 11/23>>CT Head: unremarkable 11/29>> CT abdomen/pelvis>>  progressive colitis  COVID-19 medications: Steroids: 11/17>>11/26 Remdesivir:11/17>>11/22  His diarrhea is improving but patient had a bloody bowel movement a few days ago so GI was consulted.  PT OT still recommending skilled nursing facility.  Will need to continue monitor his hemoglobin/hematocrit and evaluate for further signs and symptoms of bleeding. Nursing states that he has had several brown bowel movements with no evidence of any gross blood in it. Hemoglobin/hematocrit remained stable. Daughter requested that the patient be faxed out to other skilled nursing facility so that it could be a closer distance for her given that he is now tested negative for Covid. If a bed is available closer to the patient's daughter could be potentially discharged next 24 to 48 hours.  Assessment & Plan:   Principal Problem:   COVID-19 virus infection Active Problems:   Generalized weakness   Acute pulmonary embolism (HCC)   Hematochezia   Protein-calorie malnutrition, severe  Cardiac Arrest due to large acute PE and DVT -PE likely provoked by COVID-19 infection -Status post TPA, was on therapeutic Lovenox, transitioned to Eliquis, then heparin, now has IVC filter  -PCCM MD discussed with Dr. Gala Romney, no indication for any acute intervention. -2D echo showed EF of 50 to 55%, RV systolic function severely reduced -Continue to Monitor carefully  Large PE with DVT - s/p TPA on 11/19, when he had cardiac arrest.  -S/p IVC filter given mobile thrombus seen on the lower extremity Doppler.   -He was then transitioned to Eliquis, subsequently held due to GI bleed on 06/16/20; This was resumed on 06/17/2020 at the recommendations of GI and is having minimal bleeding and small volume blood but none overnight per nursing  Acute Hypoxic Respiratory Failure, due to PE/cardiac arrest/COVID-19 pneumonia -Currently stable on room air, self extubated on 11/23 -Has completed a course of penicillin,  steroids.   -Hypoxia resolved, currently on room air  -We  will need an ambulatory home O2 screen prior to discharge; will order one today  GI bleed, noted on 06/15/2020 -Patient was on anticoagulation for PE, had on 12/14, has IVC filter -GI was consulted.  Seen by Dr. Lavon Paganini, recommended to continue eliquis, if has persistent hematochezia then will plan colonoscopy for evaluation -H&H stable yesterday 8.5/, will resume Eliquis 06/18/2019 1 in the afternoon and monitor H&H or any bleeding -Recommended Anusol suppository twice daily, Metamucil -Had minimal bleeding today and continues to have some mucoid blood -Need to monitor closely to see if he continues to have hematochezia otherwise we will plan for colonoscopy -Continue with Anusol suppositories  Acute Metabolic Encephalopathy, ICU delirium, possible anoxic injury -Much more alert and oriented today, CT head without acute changes. -Minimize sedating medications, continue supportive care  Dysphagia -In the setting of cardiac arrest, seen by SLP, required NG tube -Diet gradually upgraded to dysphagia 3 diet -Continue with aspiration precautions  Hepatitis A -Serologies were positive Hep A Ab was Positive  -Continue supportive care  Leukocytosis -Mild and likely reactive -Patient's WBC went from 10.1 and is elevated to 11.6 today and is now back down to 10.8 -Continue to monitor for signs and symptoms of infection; currently getting antibiotics for C. difficile colitis -Repeat CMP in the AM  C. Difficile Colitis  -Diarrhea improving, AKI resolved, leukocytosis improved.   -Continue oral vancomycin; having some minimal bloody stools with some mucus -Already on p.o. vancomycin 125 mg every 6 hours for 14 days. He missed a dose on 06/23/20 stop date has been placed  AKI:  -Likely due to diarrhea, #1,  -Resolved with IV fluids -Last BUN/creatinine 14/1.05 today -Avoid nephrotoxic medications, contrast dyes, hypotension  and renally dose medications -Repeat CMP in a.m.  Thrombocytosis -Likely Reactive in the setting of Above -Patient's Platelet Count went from 808 -> 674 -> 676 -> 667 -> 650 -> 674 and today is 633 -Continue to Monitor and Trend -Repeat CBC in the AM   Paroxysmal atrial fibrillation -Back in NSR, -Patient back on anticoagulation given GI recommendations -2D echo as above, rate controlled  Normocytic Anemia -Secondary to critical illness, H&H currently stable -Last hemoglobin/hematocrit dropped from 8.5/26.1 and is now 8.3/26.4 yesterday and today it is 8.3/26.6 and stable -Has had some minor bleeding the day before yesterday and today likely from hemorrhoids but GI recommending continuing Eliquis and has not had any overnight -GI recommending continue Anusol suppositories twice a day for 5 to 7 days continue Eliquis  Transaminitis/Abnormal LFTs -Likely secondary to COVID-19, down-trending -Also could be in the setting of hepatitis A -Last check on 12/12 showed an AST of 42 and ALT of 68; Now AST is 56 and ALT 67 -Continue to Monitor and Trend -Repeat CMP in the AM   Generalized debility, deconditioning -PT OT evaluation completed, recommendations for SNF -SNF requires stools to be formed and solidified; Will discharge when medically stable -Had some Clear Mucous with Red Streaks   Hard of hearing  -C/w supportive care.  Hyponatremia  -Urine osmolality 311, serum osmolality 270, points towards SIADH however hypotension had required IV fluids.  Also drinks more than 5-6 bottles of soda every day -Currently alert and awake, no acute issues, NA 133 yesterday and trended down to 131 and is stable   Underweight, Severe protein calorie malnutrition, albumin 1.5 on 12/12 -Nutritionist is consulted for further evaluation recommendations -Estimated body mass index is 17.52 kg/m as calculated from the following:   Height as of this encounter:  6' (1.829 m).   Weight as of this  encounter: 58.6 kg. -Continue with with Ensure Enlive p.o. 3 times daily as well as multivitamin with minerals daily  DVT prophylaxis: Anticoagulated with Eliquis Code Status: FULL CODE  Family Communication: Discussed with Daughter [place] over the Phone Disposition Plan: Pending further clinical improvement and stabilization of his GI bleeding; GI signed off the case but will need to continue monitor for hematochezia; his stools need to be formed prior to discharge to SNF and will need an ambulatory home O2 screen prior to discharge  Status is: Inpatient  Remains inpatient appropriate because:Unsafe d/c plan, IV treatments appropriate due to intensity of illness or inability to take PO and Inpatient level of care appropriate due to severity of illness   Dispo: The patient is from: Home              Anticipated d/c is to: SNF              Anticipated d/c date is: 1-2 days              Patient currently is not medically stable to d/c.  Consultants:   PCCM Transfer  Gastroenterology   Procedures:   Antimicrobials:  Anti-infectives (From admission, onward)   Start     Dose/Rate Route Frequency Ordered Stop   06/14/20 0000  vancomycin (VANCOCIN) 50 mg/mL oral solution        125 mg Oral 4 times daily 06/14/20 1011 06/19/20 2359   06/13/20 1200  vancomycin (VANCOCIN) 50 mg/mL oral solution 125 mg        125 mg Oral Every 6 hours 06/13/20 0933     06/10/20 2230  vancomycin (VANCOREADY) IVPB 1250 mg/250 mL        1,250 mg 166.7 mL/hr over 90 Minutes Intravenous  Once 06/10/20 2139 06/11/20 0140   06/10/20 2230  cephALEXin (KEFLEX) capsule 500 mg        500 mg Oral  Once 06/10/20 2139 06/10/20 2155   06/10/20 0000  vancomycin (VANCOCIN) 50 mg/mL oral solution  Status:  Discontinued        125 mg Oral 4 times daily 06/10/20 1053 06/14/20    06/02/20 1000  vancomycin (VANCOCIN) 50 mg/mL oral solution 125 mg        125 mg Oral 4 times daily 06/02/20 0800 06/11/20 2158   05/22/20 1000   remdesivir 100 mg in sodium chloride 0.9 % 100 mL IVPB       "Followed by" Linked Group Details   100 mg 200 mL/hr over 30 Minutes Intravenous Daily 05/21/20 0857 05/25/20 1154   05/21/20 1030  remdesivir 200 mg in sodium chloride 0.9% 250 mL IVPB  Status:  Discontinued       "Followed by" Linked Group Details   200 mg 580 mL/hr over 30 Minutes Intravenous Once 05/21/20 0857 05/27/20 0734   05/21/20 0330  metroNIDAZOLE (FLAGYL) tablet 500 mg        500 mg Oral  Once 05/21/20 0320 05/21/20 0411   05/20/20 2330  cefTRIAXone (ROCEPHIN) 2 g in sodium chloride 0.9 % 100 mL IVPB        2 g 200 mL/hr over 30 Minutes Intravenous  Once 05/20/20 2324 05/21/20 0331        Subjective: Seen and examined at bedside and remains extremely hard of hearing.  Denies any complaints of pain.  No nausea or vomiting.  Nursing states that he had a bowel movement and there is  no evidence of any blood in it.  Denies any other concerns or plans at this time.  Feels relatively well and we will have him ambulate today.  Objective: Vitals:   06/18/20 2124 06/19/20 0459 06/19/20 0500 06/19/20 1311  BP: 98/66 97/61  (!) 94/59  Pulse: 93 83  94  Resp: 18 16  16   Temp: 98.6 F (37 C) 98.1 F (36.7 C)  98.9 F (37.2 C)  TempSrc: Oral Oral    SpO2: 98% 98%  96%  Weight:   58.6 kg   Height:        Intake/Output Summary (Last 24 hours) at 06/19/2020 1552 Last data filed at 06/19/2020 0501 Gross per 24 hour  Intake --  Output 1875 ml  Net -1875 ml   Filed Weights   06/16/20 0520 06/17/20 0056 06/19/20 0500  Weight: 59.4 kg 59.1 kg 58.6 kg   Examination: Physical Exam:  Constitutional: Thin frail chronically ill-appearing Caucasian male in NAD and appears calm and comfortable Eyes: Lids and conjunctivae normal, sclerae anicteric  ENMT: External Ears, Nose appear normal. Extremely hard of hearing Neck: Appears normal, supple, no cervical masses, normal ROM, no appreciable thyromegaly Respiratory:  Diminished to auscultation bilaterally, no wheezing, rales, rhonchi or crackles. Normal respiratory effort and patient is not tachypenic. No accessory muscle use. Unlabored breathing and not wearing any supplemental O2 via [place] Cardiovascular: RRR, no murmurs / rubs / gallops. S1 and S2 auscultated. Minimal extremity edema Abdomen: Soft, non-tender, non-distended. Bowel sounds positive.  GU: Deferred. Musculoskeletal: No clubbing / cyanosis of digits/nails. No joint deformity upper and lower extremities.  Skin: No rashes, lesions, ulcers on a limited skin evaluation. No induration; Warm and dry.  Neurologic: CN 2-12 grossly intact with no focal deficits except that he is extremely hard of hearing. Romberg sign and cerebellar reflexes not assessed.  Psychiatric: Normal judgment and insight. Patient is Alert and oriented x 3. Normal mood and appropriate affect.   Data Reviewed: I have personally reviewed following labs and imaging studies  CBC: Recent Labs  Lab 06/13/20 0257 06/14/20 0523 06/15/20 1348 06/16/20 1233 06/16/20 1643 06/17/20 0433 06/18/20 0025 06/19/20 0327  WBC 9.5 10.0   < > 10.0 10.4 10.1 11.6* 10.8*  NEUTROABS 7.3 8.0*  --   --   --   --   --  8.0*  HGB 8.7* 8.6*   < > 8.6* 8.2* 8.5* 8.3* 8.3*  HCT 26.2* 26.2*   < > 27.7* 26.3* 26.1* 26.4* 26.6*  MCV 87.0 87.0   < > 88.2 88.0 86.4 87.1 86.9  PLT 624* 651*   < > 676* 667* 650* 674* 633*   < > = values in this interval not displayed.   Basic Metabolic Panel: Recent Labs  Lab 06/13/20 0257 06/14/20 0523 06/16/20 1233 06/17/20 0433 06/18/20 0025 06/19/20 0327  NA 132* 132* 134* 133* 131* 131*  K 4.3 4.4 4.5 4.2 4.2 4.1  CL 98 96* 96* 97* 97* 96*  CO2 26 27 27 26 25 26   GLUCOSE 94 105* 96 99 107* 101*  BUN 9 10 7* 10 11 14   CREATININE 0.88 0.86 0.88 0.95 0.99 1.05  CALCIUM 8.3* 8.4* 8.5* 8.5* 8.5* 8.4*  MG 2.0 2.0  --   --   --  2.0  PHOS  --   --   --   --   --  3.6   GFR: Estimated Creatinine Clearance:  55.8 mL/min (by C-G formula based on SCr of 1.05 mg/dL).  Liver Function Tests: Recent Labs  Lab 06/13/20 0257 06/14/20 0523 06/19/20 0327  AST 74* 42* 56*  ALT 85* 68* 67*  ALKPHOS 270* 255* 223*  BILITOT 0.6 0.7 0.5  PROT 5.2* 5.3* 5.5*  ALBUMIN 1.4* 1.5* 1.6*   No results for input(s): LIPASE, AMYLASE in the last 168 hours. No results for input(s): AMMONIA in the last 168 hours. Coagulation Profile: No results for input(s): INR, PROTIME in the last 168 hours. Cardiac Enzymes: No results for input(s): CKTOTAL, CKMB, CKMBINDEX, TROPONINI in the last 168 hours. BNP (last 3 results) No results for input(s): PROBNP in the last 8760 hours. HbA1C: No results for input(s): HGBA1C in the last 72 hours. CBG: Recent Labs  Lab 06/12/20 2118 06/13/20 0757 06/13/20 1236 06/13/20 1717 06/13/20 2100  GLUCAP 108* 108* 122* 117* 107*   Lipid Profile: No results for input(s): CHOL, HDL, LDLCALC, TRIG, CHOLHDL, LDLDIRECT in the last 72 hours. Thyroid Function Tests: No results for input(s): TSH, T4TOTAL, FREET4, T3FREE, THYROIDAB in the last 72 hours. Anemia Panel: No results for input(s): VITAMINB12, FOLATE, FERRITIN, TIBC, IRON, RETICCTPCT in the last 72 hours. Sepsis Labs: Recent Labs  Lab 06/13/20 0257 06/14/20 0523 06/15/20 0203  PROCALCITON 0.16 0.10 <0.10    Recent Results (from the past 240 hour(s))  Culture, blood (routine x 2)     Status: None   Collection Time: 06/10/20  3:35 PM   Specimen: BLOOD  Result Value Ref Range Status   Specimen Description BLOOD LEFT ANTECUBITAL  Final   Special Requests   Final    BOTTLES DRAWN AEROBIC ONLY Blood Culture adequate volume   Culture   Final    NO GROWTH 5 DAYS Performed at [facility], [address], [place], [place] [address]    Report Status 06/15/2020 FINAL  Final  Culture, blood (routine x 2)     Status: None   Collection Time: 06/10/20  3:46 PM   Specimen: BLOOD LEFT ARM  Result Value Ref Range Status    Specimen Description BLOOD LEFT ARM  Final   Special Requests   Final    BOTTLES DRAWN AEROBIC ONLY Blood Culture adequate volume   Culture   Final    NO GROWTH 5 DAYS Performed at [facility], [address], [place], [place] [address]    Report Status 06/15/2020 FINAL  Final  MRSA PCR Screening     Status: Abnormal   Collection Time: 06/10/20  4:27 PM   Specimen: Nasal Mucosa; Nasopharyngeal  Result Value Ref Range Status   MRSA by PCR POSITIVE (A) NEGATIVE Final    Comment:        The GeneXpert MRSA Assay (FDA approved for NASAL specimens only), is one component of a comprehensive MRSA colonization surveillance program. It is not intended to diagnose MRSA infection nor to guide or monitor treatment for MRSA infections. RESULT CALLED TO, READ BACK BY AND VERIFIED WITH: Kerrin Champagne RN 06/10/20 at 1943 sk  Performed at [facility], [address], [place], [place] [address]   Resp Panel by RT-PCR (Flu A&B, Covid) Nasopharyngeal Swab     Status: None   Collection Time: 06/19/20  8:17 AM   Specimen: Nasopharyngeal Swab; Nasopharyngeal(NP) swabs in vial transport medium  Result Value Ref Range Status   SARS Coronavirus 2 by RT PCR NEGATIVE NEGATIVE Final    Comment: (NOTE) SARS-CoV-2 target nucleic acids are NOT DETECTED.  The SARS-CoV-2 RNA is generally detectable in upper respiratory  specimens during the acute phase of infection. The lowest concentration of SARS-CoV-2 viral copies this assay can detect is 138 copies/mL. A negative result does not preclude SARS-Cov-2 infection and should not be used as the sole basis for treatment or other patient management decisions. A negative result may occur with  improper specimen collection/handling, submission of specimen other than nasopharyngeal swab, presence of viral mutation(s) within the areas targeted by this assay, and inadequate number of viral copies(<138 copies/mL). A negative result must be combined  with clinical observations, patient history, and epidemiological information. The expected result is Negative.  Fact Sheet for Patients:  BloggerCourse.com  Fact Sheet for Healthcare Providers:  SeriousBroker.it  This test is no t yet approved or cleared by the [place] FDA and  has been authorized for detection and/or diagnosis of SARS-CoV-2 by FDA under an Emergency Use Authorization (EUA). This EUA will remain  in effect (meaning this test can be used) for the duration of the COVID-19 declaration under Section 564(b)(1) of the Act, 21 U.S.C.section 360bbb-3(b)(1), unless the authorization is terminated  or revoked sooner.       Influenza A by PCR NEGATIVE NEGATIVE Final   Influenza B by PCR NEGATIVE NEGATIVE Final    Comment: (NOTE) The Xpert Xpress SARS-CoV-2/FLU/RSV plus assay is intended as an aid in the diagnosis of influenza from Nasopharyngeal swab specimens and should not be used as a sole basis for treatment. Nasal washings and aspirates are unacceptable for Xpert Xpress SARS-CoV-2/FLU/RSV testing.  Fact Sheet for Patients: BloggerCourse.com  Fact Sheet for Healthcare Providers: SeriousBroker.it  This test is not yet approved or cleared by the [place] FDA and has been authorized for detection and/or diagnosis of SARS-CoV-2 by FDA under an Emergency Use Authorization (EUA). This EUA will remain in effect (meaning this test can be used) for the duration of the COVID-19 declaration under Section 564(b)(1) of the Act, 21 U.S.C. section 360bbb-3(b)(1), unless the authorization is terminated or revoked.  Performed at [facility], [address] [place], [place] [address]      RN Pressure Injury Documentation:     Estimated body mass index is 17.52 kg/m as calculated from the following:   Height as of this encounter: 6' (1.829 m).   Weight as  of this encounter: 58.6 kg.  Malnutrition Type:  Nutrition Problem: Severe Malnutrition Etiology: acute illness (COVID 19 infection)  Malnutrition Characteristics:  Signs/Symptoms: severe fat depletion,severe muscle depletion  Nutrition Interventions:  Interventions: Ensure Enlive (each supplement provides 350kcal and 20 grams of protein)   Radiology Studies: No results found.  Scheduled Meds: . apixaban  5 mg Oral BID  . famotidine  20 mg Oral Daily  . feeding supplement  237 mL Oral TID BM  . hydrocortisone  25 mg Rectal BID  . multivitamin with minerals  1 tablet Oral Daily  . psyllium  1 packet Oral Daily  . sodium chloride flush  10-40 mL Intracatheter Q12H  . vancomycin  125 mg Oral Q6H   Continuous Infusions:   LOS: 29 days   Merlene Laughter, DO [facility] PAGER is on AMION  If 7PM-7AM, please contact night-coverage [email or web address]

## 2020-06-19 NOTE — TOC Progression Note (Signed)
Transition of Care [facility]) - Progression Note    Patient Details  Name: Dwayne Barnes MRN: [id number] Date of Birth: 14-Jul-1950  Transition of Care [facility]) CM/SW Contact  Lorri Frederick, LCSW Phone Number: 06/19/2020, 9:25 AM  Clinical Narrative:   CSW informed by MD that daughter is requesting new SNF option closer to her home in [place].  CSW spoke with daughter who would like to explore any options closer to [place].  She asked that CSW not cancel bed at [facility] as she heard good things about them and the only issue is distance-she will accept that bed if other options appears to be poorly rated.  CSW sent out referral to additional sites.     Expected Discharge Plan: Skilled Nursing Facility Barriers to Discharge: Barriers Resolved  Expected Discharge Plan and Services Expected Discharge Plan: Skilled Nursing Facility In-house Referral: Clinical Social Work Discharge Planning Services: CM Consult Post Acute Care Choice: Skilled Nursing Facility Living arrangements for the past 2 months: Single Family Home Expected Discharge Date: 06/14/20                                     Social Determinants of Health (SDOH) Interventions    Readmission Risk Interventions No flowsheet data found.

## 2020-06-19 NOTE — TOC Progression Note (Signed)
Transition of Care [facility]) - Progression Note    Patient Details  Name: [name] MRN: [id number] Date of Birth: 1950-11-14  Transition of Care [facility]) CM/SW Contact  Lorri Frederick, LCSW Phone Number: 06/19/2020, 9:18 AM  Clinical Narrative:   CSW spoke with Revonda Standard at [facility], updated that pt not likely to discharge tomorrow.  SNF auth will expire tomorrow, has already been extended once, and won't be allowed to extend again.  Auth will need to expire and be resubmitted, most likely Monday.    Expected Discharge Plan: Skilled Nursing Facility Barriers to Discharge: Barriers Resolved  Expected Discharge Plan and Services Expected Discharge Plan: Skilled Nursing Facility In-house Referral: Clinical Social Work Discharge Planning Services: CM Consult Post Acute Care Choice: Skilled Nursing Facility Living arrangements for the past 2 months: Single Family Home Expected Discharge Date: 06/14/20                                     Social Determinants of Health (SDOH) Interventions    Readmission Risk Interventions No flowsheet data found.

## 2020-06-19 NOTE — Plan of Care (Signed)
  Problem: Education: Goal: Knowledge of risk factors and measures for prevention of condition will improve Outcome: Progressing   

## 2020-06-19 NOTE — Telephone Encounter (Signed)
Spoke with the daughter. She has questions about her father's status. Discussed role of the hospital nurse and the hospitalist in charge of her father's care. She thanks me for this information. Very nice person.

## 2020-06-20 DIAGNOSIS — Z8674 Personal history of sudden cardiac arrest: Secondary | ICD-10-CM

## 2020-06-20 LAB — COMPREHENSIVE METABOLIC PANEL
ALT: 68 U/L — ABNORMAL HIGH (ref 0–44)
AST: 56 U/L — ABNORMAL HIGH (ref 15–41)
Albumin: 1.7 g/dL — ABNORMAL LOW (ref 3.5–5.0)
Alkaline Phosphatase: 236 U/L — ABNORMAL HIGH (ref 38–126)
Anion gap: 11 (ref 5–15)
BUN: 12 mg/dL (ref 8–23)
CO2: 26 mmol/L (ref 22–32)
Calcium: 8.6 mg/dL — ABNORMAL LOW (ref 8.9–10.3)
Chloride: 95 mmol/L — ABNORMAL LOW (ref 98–111)
Creatinine, Ser: 0.98 mg/dL (ref 0.61–1.24)
GFR, Estimated: >60 mL/min (ref >60)
Glucose, Bld: 95 mg/dL (ref 70–99)
Potassium: 4.2 mmol/L (ref 3.5–5.1)
Sodium: 132 mmol/L — ABNORMAL LOW (ref 135–145)
Total Bilirubin: 0.6 mg/dL (ref 0.3–1.2)
Total Protein: 5.8 g/dL — ABNORMAL LOW (ref 6.5–8.1)

## 2020-06-20 LAB — CBC WITH DIFFERENTIAL/PLATELET
Abs Immature Granulocytes: 0.25 10*3/uL — ABNORMAL HIGH (ref 0.00–0.07)
Basophils Absolute: 0.1 10*3/uL (ref 0.0–0.1)
Basophils Relative: 1 %
Eosinophils Absolute: 0.1 10*3/uL (ref 0.0–0.5)
Eosinophils Relative: 1 %
HCT: 26.1 % — ABNORMAL LOW (ref 39.0–52.0)
Hemoglobin: 8.3 g/dL — ABNORMAL LOW (ref 13.0–17.0)
Immature Granulocytes: 2 %
Lymphocytes Relative: 18 %
Lymphs Abs: 2 10*3/uL (ref 0.7–4.0)
MCH: 27.7 pg (ref 26.0–34.0)
MCHC: 31.8 g/dL (ref 30.0–36.0)
MCV: 87 fL (ref 80.0–100.0)
Monocytes Absolute: 0.7 10*3/uL (ref 0.1–1.0)
Monocytes Relative: 7 %
Neutro Abs: 8 10*3/uL — ABNORMAL HIGH (ref 1.7–7.7)
Neutrophils Relative %: 71 %
Platelets: 635 10*3/uL — ABNORMAL HIGH (ref 150–400)
RBC: 3 MIL/uL — ABNORMAL LOW (ref 4.22–5.81)
RDW: 14.9 % (ref 11.5–15.5)
WBC: 11.1 10*3/uL — ABNORMAL HIGH (ref 4.0–10.5)
nRBC: 0 % (ref 0.0–0.2)

## 2020-06-20 LAB — C-REACTIVE PROTEIN: CRP: 9.6 mg/dL — ABNORMAL HIGH (ref <1.0)

## 2020-06-20 LAB — MAGNESIUM: Magnesium: 1.9 mg/dL (ref 1.7–2.4)

## 2020-06-20 LAB — PHOSPHORUS: Phosphorus: 4.5 mg/dL (ref 2.5–4.6)

## 2020-06-20 NOTE — TOC Progression Note (Addendum)
Transition of Care [facility]) - Progression Note    Patient Details  Name: Dwayne Barnes MRN: [id number] Date of Birth: 11-Sep-1950  Transition of Care [facility]) CM/SW Contact  Carley Hammed, [place] Phone Number: 06/20/2020, 4:08 PM  Clinical Narrative:    CSW attempted to find a SNF closer to [employer], but was not able too. CSW spoke with dtr Cecilie Kicks, and she is ok with [name]. Facility was contacted and voicemail left. Facility needs to start authorization. CSW will continue to follow for DC.  CSW confirmed with Facility that they will start auth when they are able. They noted it might not be before Monday. CSW will continue to follow for DC planning.   Expected Discharge Plan: Skilled Nursing Facility Barriers to Discharge: Barriers Resolved  Expected Discharge Plan and Services Expected Discharge Plan: Skilled Nursing Facility In-house Referral: Clinical Social Work Discharge Planning Services: CM Consult Post Acute Care Choice: Skilled Nursing Facility Living arrangements for the past 2 months: Single Family Home Expected Discharge Date: 06/14/20                                     Social Determinants of Health (SDOH) Interventions    Readmission Risk Interventions No flowsheet data found.

## 2020-06-20 NOTE — Progress Notes (Signed)
PROGRESS NOTE    Dwayne Barnes  ZOX:[id number] DOB: 1950/11/23 DOA: 05/20/2020 PCP: [name], PA-C   Brief Narrative:   The patient is a69 y.o.malewith no significant past medical history-presented to the ED on 11/17 with nausea/vomiting, weakness and back pain-further evaluation revealed COVID-19 infection and a large PE. Patient was started on IV heparin infusion along with steroid/Remdesivir-on 11/19-patient had a cardiac arrest-was intubated during the code-total downtime of around 6 minutes before ROSC. Patient was subsequently transferred to the ICU-unfortunately soon after arrival in the ICU-he had another cardiac arrest-and 4 rounds of CPR were provided with ROSC. Patient was provided supportive care-he subsequently improved-self extubated on 11/23-he was [facility] on 11/25. Further hospital course complicated by diarrhea secondary to C. difficile along with AKI and hyponatremia.See below for further details.  COVID-19 vaccination status: Vaccinated Significant Events: 11/17>> Admit to [facility] for PE/COVID-19 infection 11/19>> PEA cardiac arrest x 2-given TPA-intubated-transfer to ICU 11/23>> Self extubated 11/25>> transfer to [facility] 11/29>> worsening diarrhea-CT abdomen with progressive colitis 12/14>> GI Bleeding noted so GI was consulted and recommending continue Eliquis at this time and if patient continues to have persistent hematochezia they are planning a colonoscopy for further evaluation; they felt that the bleeding was from hemorrhoids  Significant studies: 11/18>>CT renal stone: non specific colitis, GGO's, left nephrolithiasis without obstructive uropathy. 11/18>>CTA chest: large PC in LLL, GGO's LLL. 11/20 echo>> EF 50-55%, RV systolic function severely reduced. 11/21>> B/L extremity Doppler: acute DVTin left femoral vein, acute mobile thrombus in the proximal femoral vein. 11/23>>CT Head: unremarkable 11/29>> CT abdomen/pelvis>>  progressive colitis  COVID-19 medications: Steroids: 11/17>>11/26 Remdesivir:11/17>>11/22  His diarrhea is improving but patient had a bloody bowel movement a few days ago so GI was consulted.  PT OT still recommending skilled nursing facility.  Will need to continue monitor his hemoglobin/hematocrit and evaluate for further signs and symptoms of bleeding. Nursing states that he has had several brown bowel movements with no evidence of any gross blood in it. Hemoglobin/hematocrit remained stable. Daughter requested that the patient be faxed out to other skilled nursing facility so that it could be a closer distance for her given that he is now tested negative for Covid. If a bed is available closer to the patient's daughter could be potentially discharged next 24 to 48 hours as he is now deemed medically stable.   Assessment & Plan:   Principal Problem:   COVID-19 virus infection Active Problems:   Generalized weakness   Acute pulmonary embolism (HCC)   Hematochezia   Protein-calorie malnutrition, severe  Cardiac Arrest due to large acute PE and DVT -PE likely provoked by COVID-19 infection -Status post TPA, was on therapeutic Lovenox, transitioned to Eliquis, then heparin, now has IVC filter  -PCCM MD discussed with Dr. Gala Romney, no indication for any acute intervention. -2D echo showed EF of 50 to 55%, RV systolic function severely reduced -Continue to Monitor carefully  Large PE with DVT - s/p TPA on 11/19, when he had cardiac arrest.  -S/p IVC filter given mobile thrombus seen on the lower extremity Doppler.   -He was then transitioned to Eliquis, subsequently held due to GI bleed on 06/16/20; This was resumed on 06/17/2020 at the recommendations of GI and is having minimal bleeding and small volume blood but none the last few nights per nursing  Acute Hypoxic Respiratory Failure, due to PE/cardiac arrest/COVID-19 pneumonia -Currently stable on room air, self extubated on  11/23 -Has completed a course of penicillin,  steroids and Remdesivir; CRP still elevated but stable and trended down from a few weeks ago  -Hypoxia resolved, currently on room air  -We will need an ambulatory home O2 screen prior to discharge; ordered yesterday but not done so will order another one today   GI bleed, noted on 06/15/2020 -Patient was on anticoagulation for PE, had on 12/14, has IVC filter -GI was consulted.  Seen by Dr. Lavon PaganiniNandigam, recommended to continue eliquis, if has persistent hematochezia then will plan colonoscopy for evaluation -H&H stable yesterday 8.5/, will resume Eliquis 06/18/2019 1 in the afternoon and monitor H&H or any bleeding -Recommended Anusol suppository twice daily, Metamucil -Had minimal bleeding today and continues to have some mucoid blood -Need to monitor closely to see if he continues to have hematochezia otherwise we will plan for colonoscopy -Continue with Anusol suppositories  Acute Metabolic Encephalopathy, ICU delirium, possible anoxic injury -Much more alert and oriented today, CT head without acute changes. -Minimize sedating medications, continue supportive care  Dysphagia -In the setting of cardiac arrest, seen by SLP, required NG tube -Diet gradually upgraded to dysphagia 3 diet -Continue with aspiration precautions  Hepatitis A -Serologies were positive Hep A Ab was Positive  -Continue supportive care  Leukocytosis -Mild and likely reactive -Patient's WBC went from 10.1 -> 11.6 -> 10.8 -> 11.1 -Continue to monitor for signs and symptoms of infection; currently getting antibiotics for C. difficile colitis -Repeat CMP in the AM  C. Difficile Colitis, improving  -Diarrhea improving, AKI resolved, leukocytosis improved.   -Continue oral vancomycin; having some minimal bloody stools with some mucus -Already on p.o. vancomycin 125 mg every 6 hours for 14 days. He missed a dose on 06/23/20 stop date has been placed and he completes  it today 06/20/20  AKI:  -Likely due to diarrhea, #1,  -Resolved with IV fluids -Last BUN/creatinine 14/1.05 yesterday and today is 12/0.98 -Avoid nephrotoxic medications, contrast dyes, hypotension and renally dose medications -Repeat CMP in a.m.  Thrombocytosis -Likely Reactive in the setting of Above -Patient's Platelet Count went from 808 -> 674 -> 676 -> 667 -> 650 -> 674 -> 633 -> 635 -Continue to Monitor and Trend -Repeat CBC in the AM   Paroxysmal atrial fibrillation -Back in NSR, -Patient back on anticoagulation given GI recommendations -2D echo as above, rate controlled  Normocytic Anemia -Secondary to critical illness, H&H currently stable -Last hemoglobin/hematocrit has been stable the last week and today it is 8.3/26.1 -Has had some minor bleeding the day before yesterday and today likely from hemorrhoids but GI recommending continuing Eliquis and has not had any overnight -GI recommending continue Anusol suppositories twice a day for 5 to 7 days continue Eliquis  Transaminitis/Abnormal LFTs -Likely secondary to COVID-19, down-trending -Also could be in the setting of hepatitis A -On 06/14/20 AST of 42 and ALT of 68; Now AST is 56 and ALT 68 and not really changed from yesterday -? Related to Hepatitis A -Continue to Monitor and Trend -Repeat CMP in the AM   Generalized debility, deconditioning -PT OT evaluation completed, recommendations for SNF -SNF requires stools to be formed and solidified and they have improved; Medically stable to be discharged  -Had some Clear Mucous with Red Streaks but Bloody stools improved   Hard of Hearing  -C/w supportive care.  Hyponatremia  -Urine osmolality 311, serum osmolality 270, points towards SIADH however hypotension had required IV fluids.  Also drinks more than 5-6 bottles of soda every day -Currently alert and awake, no acute  issues, Na+ is stable at 132  Underweight, Severe protein calorie malnutrition,  albumin 1.5 on 12/12 -Nutritionist is consulted for further evaluation recommendations -Estimated body mass index is 17.52 kg/m as calculated from the following:   Height as of this encounter: 6' (1.829 m).   Weight as of this encounter: 58.6 kg. -Continue with with Ensure Enlive p.o. 3 times daily as well as multivitamin with minerals daily  DVT prophylaxis: Anticoagulated with Eliquis Code Status: FULL CODE  Family Communication: Discussed with Daughter [place] over the Phone yesterday  Disposition Plan: Pending further clinical improvement and stabilization of his GI bleeding; GI signed off the case but will need to continue monitor for hematochezia; his stools need to be formed prior to discharge to SNF and they have been now. He will need an ambulatory home O2 screen prior to discharge and this is pending   Status is: Inpatient  Remains inpatient appropriate because:Unsafe d/c plan, IV treatments appropriate due to intensity of illness or inability to take PO and Inpatient level of care appropriate due to severity of illness   Dispo: The patient is from: Home              Anticipated d/c is to: SNF              Anticipated d/c date is: 1-2 days              Patient currently is medically stable to d/c.  Consultants:   PCCM Transfer  Gastroenterology   Procedures:   Antimicrobials:  Anti-infectives (From admission, onward)   Start     Dose/Rate Route Frequency Ordered Stop   06/14/20 0000  vancomycin (VANCOCIN) 50 mg/mL oral solution        125 mg Oral 4 times daily 06/14/20 1011 06/19/20 2359   06/13/20 1200  vancomycin (VANCOCIN) 50 mg/mL oral solution 125 mg        125 mg Oral Every 6 hours 06/13/20 0933     06/10/20 2230  vancomycin (VANCOREADY) IVPB 1250 mg/250 mL        1,250 mg 166.7 mL/hr over 90 Minutes Intravenous  Once 06/10/20 2139 06/11/20 0140   06/10/20 2230  cephALEXin (KEFLEX) capsule 500 mg        500 mg Oral  Once 06/10/20 2139 06/10/20 2155    06/10/20 0000  vancomycin (VANCOCIN) 50 mg/mL oral solution  Status:  Discontinued        125 mg Oral 4 times daily 06/10/20 1053 06/14/20    06/02/20 1000  vancomycin (VANCOCIN) 50 mg/mL oral solution 125 mg        125 mg Oral 4 times daily 06/02/20 0800 06/11/20 2158   05/22/20 1000  remdesivir 100 mg in sodium chloride 0.9 % 100 mL IVPB       "Followed by" Linked Group Details   100 mg 200 mL/hr over 30 Minutes Intravenous Daily 05/21/20 0857 05/25/20 1154   05/21/20 1030  remdesivir 200 mg in sodium chloride 0.9% 250 mL IVPB  Status:  Discontinued       "Followed by" Linked Group Details   200 mg 580 mL/hr over 30 Minutes Intravenous Once 05/21/20 0857 05/27/20 0734   05/21/20 0330  metroNIDAZOLE (FLAGYL) tablet 500 mg        500 mg Oral  Once 05/21/20 0320 05/21/20 0411   05/20/20 2330  cefTRIAXone (ROCEPHIN) 2 g in sodium chloride 0.9 % 100 mL IVPB        2 g 200  mL/hr over 30 Minutes Intravenous  Once 05/20/20 2324 05/21/20 0331        Subjective: Seen and examined at bedside and still is very hard of hearing but denies any complaints of pain he states that he feels well. No SOB. No complaints or concerns at this time.   Objective: Vitals:   06/19/20 1311 06/19/20 2100 06/20/20 0500 06/20/20 1408  BP: (!) 94/59 94/67 110/80 113/68  Pulse: 94 87 90 83  Resp: 16 17 17 17   Temp: 98.9 F (37.2 C) 99.2 F (37.3 C) 98 F (36.7 C) 99.3 F (37.4 C)  TempSrc:  Oral Oral   SpO2: 96%   98%  Weight:      Height:        Intake/Output Summary (Last 24 hours) at 06/20/2020 1443 Last data filed at 06/19/2020 2125 Gross per 24 hour  Intake 10 ml  Output --  Net 10 ml   Filed Weights   06/16/20 0520 06/17/20 0056 06/19/20 0500  Weight: 59.4 kg 59.1 kg 58.6 kg   Examination: Physical Exam:  Constitutional: Patient is a thin frail chronically ill-appearing Caucasian male currently no acute distress and appears calm and comfortable watching television Eyes: Lids and  conjunctivae normal, sclerae anicteric  ENMT: External Ears, Nose appear normal.  He is extremely hard of hearing Neck: Appears normal, supple, no cervical masses, normal ROM, no appreciable thyromegaly; no JVD Respiratory: Diminished to auscultation bilaterally with coarse breath sounds, no wheezing, rales, rhonchi or crackles. Normal respiratory effort and patient is not tachypenic. No accessory muscle use.  Unlabored breathing and not wearing supplemental oxygen via nasal cannula Cardiovascular: RRR, no murmurs / rubs / gallops. S1 and S2 auscultated.  Slight extremity edema Abdomen: Soft, non-tender, non-distended. Bowel sounds positive.  GU: Deferred. Musculoskeletal: No clubbing / cyanosis of digits/nails. No joint deformity upper and lower extremities.   Skin: No rashes, lesions, ulcers on to skin evaluation. No induration; Warm and dry.  Neurologic: CN 2-12 grossly intact with no focal deficits except that he is extremely hard of hearing. Romberg sign and cerebellar reflexes not assessed.  Psychiatric: Normal judgment and insight. Alert and oriented x 3. Normal mood and appropriate affect.   Data Reviewed: I have personally reviewed following labs and imaging studies  CBC: Recent Labs  Lab 06/14/20 0523 06/15/20 1348 06/16/20 1643 06/17/20 0433 06/18/20 0025 06/19/20 0327 06/20/20 0222  WBC 10.0   < > 10.4 10.1 11.6* 10.8* 11.1*  NEUTROABS 8.0*  --   --   --   --  8.0* 8.0*  HGB 8.6*   < > 8.2* 8.5* 8.3* 8.3* 8.3*  HCT 26.2*   < > 26.3* 26.1* 26.4* 26.6* 26.1*  MCV 87.0   < > 88.0 86.4 87.1 86.9 87.0  PLT 651*   < > 667* 650* 674* 633* 635*   < > = values in this interval not displayed.   Basic Metabolic Panel: Recent Labs  Lab 06/14/20 0523 06/16/20 1233 06/17/20 0433 06/18/20 0025 06/19/20 0327 06/20/20 0222  NA 132* 134* 133* 131* 131* 132*  K 4.4 4.5 4.2 4.2 4.1 4.2  CL 96* 96* 97* 97* 96* 95*  CO2 27 27 26 25 26 26   GLUCOSE 105* 96 99 107* 101* 95  BUN 10 7*  10 11 14 12   CREATININE 0.86 0.88 0.95 0.99 1.05 0.98  CALCIUM 8.4* 8.5* 8.5* 8.5* 8.4* 8.6*  MG 2.0  --   --   --  2.0 1.9  PHOS  --   --   --   --  3.6 4.5   GFR: Estimated Creatinine Clearance: 59.8 mL/min (by C-G formula based on SCr of 0.98 mg/dL). Liver Function Tests: Recent Labs  Lab 06/14/20 0523 06/19/20 0327 06/20/20 0222  AST 42* 56* 56*  ALT 68* 67* 68*  ALKPHOS 255* 223* 236*  BILITOT 0.7 0.5 0.6  PROT 5.3* 5.5* 5.8*  ALBUMIN 1.5* 1.6* 1.7*   No results for input(s): LIPASE, AMYLASE in the last 168 hours. No results for input(s): AMMONIA in the last 168 hours. Coagulation Profile: No results for input(s): INR, PROTIME in the last 168 hours. Cardiac Enzymes: No results for input(s): CKTOTAL, CKMB, CKMBINDEX, TROPONINI in the last 168 hours. BNP (last 3 results) No results for input(s): PROBNP in the last 8760 hours. HbA1C: No results for input(s): HGBA1C in the last 72 hours. CBG: Recent Labs  Lab 06/13/20 1717 06/13/20 2100  GLUCAP 117* 107*   Lipid Profile: No results for input(s): CHOL, HDL, LDLCALC, TRIG, CHOLHDL, LDLDIRECT in the last 72 hours. Thyroid Function Tests: No results for input(s): TSH, T4TOTAL, FREET4, T3FREE, THYROIDAB in the last 72 hours. Anemia Panel: No results for input(s): VITAMINB12, FOLATE, FERRITIN, TIBC, IRON, RETICCTPCT in the last 72 hours. Sepsis Labs: Recent Labs  Lab 06/14/20 0523 06/15/20 0203  PROCALCITON 0.10 <0.10    Recent Results (from the past 240 hour(s))  Culture, blood (routine x 2)     Status: None   Collection Time: 06/10/20  3:35 PM   Specimen: BLOOD  Result Value Ref Range Status   Specimen Description BLOOD LEFT ANTECUBITAL  Final   Special Requests   Final    BOTTLES DRAWN AEROBIC ONLY Blood Culture adequate volume   Culture   Final    NO GROWTH 5 DAYS Performed at [facility], [address], [place], [place] [address]    Report Status 06/15/2020 FINAL  Final  Culture, blood (routine  x 2)     Status: None   Collection Time: 06/10/20  3:46 PM   Specimen: BLOOD LEFT ARM  Result Value Ref Range Status   Specimen Description BLOOD LEFT ARM  Final   Special Requests   Final    BOTTLES DRAWN AEROBIC ONLY Blood Culture adequate volume   Culture   Final    NO GROWTH 5 DAYS Performed at [facility], [address] [place], [place] [address]    Report Status 06/15/2020 FINAL  Final  MRSA PCR Screening     Status: Abnormal   Collection Time: 06/10/20  4:27 PM   Specimen: Nasal Mucosa; Nasopharyngeal  Result Value Ref Range Status   MRSA by PCR POSITIVE (A) NEGATIVE Final    Comment:        The GeneXpert MRSA Assay (FDA approved for NASAL specimens only), is one component of a comprehensive MRSA colonization surveillance program. It is not intended to diagnose MRSA infection nor to guide or monitor treatment for MRSA infections. RESULT CALLED TO, READ BACK BY AND VERIFIED WITH: Kerrin Champagne RN 06/10/20 at 1943 sk  Performed at [facility], [address] [place], [place] [address]   Resp Panel by RT-PCR (Flu A&B, Covid) Nasopharyngeal Swab     Status: None   Collection Time: 06/19/20  8:17 AM   Specimen: Nasopharyngeal Swab; Nasopharyngeal(NP) swabs in vial transport medium  Result Value Ref Range Status   SARS Coronavirus 2 by RT PCR NEGATIVE NEGATIVE Final    Comment: (NOTE) SARS-CoV-2 target nucleic acids are NOT DETECTED.  The SARS-CoV-2  RNA is generally detectable in upper respiratory specimens during the acute phase of infection. The lowest concentration of SARS-CoV-2 viral copies this assay can detect is 138 copies/mL. A negative result does not preclude SARS-Cov-2 infection and should not be used as the sole basis for treatment or other patient management decisions. A negative result may occur with  improper specimen collection/handling, submission of specimen other than nasopharyngeal swab, presence of viral mutation(s) within the areas  targeted by this assay, and inadequate number of viral copies(<138 copies/mL). A negative result must be combined with clinical observations, patient history, and epidemiological information. The expected result is Negative.  Fact Sheet for Patients:  BloggerCourse.com  Fact Sheet for Healthcare Providers:  SeriousBroker.it  This test is no t yet approved or cleared by the [place] FDA and  has been authorized for detection and/or diagnosis of SARS-CoV-2 by FDA under an Emergency Use Authorization (EUA). This EUA will remain  in effect (meaning this test can be used) for the duration of the COVID-19 declaration under Section 564(b)(1) of the Act, 21 U.S.C.section 360bbb-3(b)(1), unless the authorization is terminated  or revoked sooner.       Influenza A by PCR NEGATIVE NEGATIVE Final   Influenza B by PCR NEGATIVE NEGATIVE Final    Comment: (NOTE) The Xpert Xpress SARS-CoV-2/FLU/RSV plus assay is intended as an aid in the diagnosis of influenza from Nasopharyngeal swab specimens and should not be used as a sole basis for treatment. Nasal washings and aspirates are unacceptable for Xpert Xpress SARS-CoV-2/FLU/RSV testing.  Fact Sheet for Patients: BloggerCourse.com  Fact Sheet for Healthcare Providers: SeriousBroker.it  This test is not yet approved or cleared by the [place] FDA and has been authorized for detection and/or diagnosis of SARS-CoV-2 by FDA under an Emergency Use Authorization (EUA). This EUA will remain in effect (meaning this test can be used) for the duration of the COVID-19 declaration under Section 564(b)(1) of the Act, 21 U.S.C. section 360bbb-3(b)(1), unless the authorization is terminated or revoked.  Performed at [facility], [address] [place], [place] [address]      RN Pressure Injury Documentation:     Estimated body mass  index is 17.52 kg/m as calculated from the following:   Height as of this encounter: 6' (1.829 m).   Weight as of this encounter: 58.6 kg.  Malnutrition Type:  Nutrition Problem: Severe Malnutrition Etiology: acute illness (COVID 19 infection)  Malnutrition Characteristics:  Signs/Symptoms: severe fat depletion,severe muscle depletion  Nutrition Interventions:  Interventions: Ensure Enlive (each supplement provides 350kcal and 20 grams of protein)   Radiology Studies: No results found.  Scheduled Meds: . apixaban  5 mg Oral BID  . famotidine  20 mg Oral Daily  . feeding supplement  237 mL Oral TID BM  . hydrocortisone  25 mg Rectal BID  . multivitamin with minerals  1 tablet Oral Daily  . psyllium  1 packet Oral Daily  . sodium chloride flush  10-40 mL Intracatheter Q12H  . vancomycin  125 mg Oral Q6H   Continuous Infusions:   LOS: 30 days   Merlene Laughter, DO [facility] PAGER is on AMION  If 7PM-7AM, please contact night-coverage [email or web address]

## 2020-06-21 LAB — COMPREHENSIVE METABOLIC PANEL
ALT: 74 U/L — ABNORMAL HIGH (ref 0–44)
AST: 63 U/L — ABNORMAL HIGH (ref 15–41)
Albumin: 1.7 g/dL — ABNORMAL LOW (ref 3.5–5.0)
Alkaline Phosphatase: 229 U/L — ABNORMAL HIGH (ref 38–126)
Anion gap: 11 (ref 5–15)
BUN: 12 mg/dL (ref 8–23)
CO2: 26 mmol/L (ref 22–32)
Calcium: 8.6 mg/dL — ABNORMAL LOW (ref 8.9–10.3)
Chloride: 94 mmol/L — ABNORMAL LOW (ref 98–111)
Creatinine, Ser: 0.91 mg/dL (ref 0.61–1.24)
GFR, Estimated: >60 mL/min (ref >60)
Glucose, Bld: 110 mg/dL — ABNORMAL HIGH (ref 70–99)
Potassium: 4.6 mmol/L (ref 3.5–5.1)
Sodium: 131 mmol/L — ABNORMAL LOW (ref 135–145)
Total Bilirubin: 0.5 mg/dL (ref 0.3–1.2)
Total Protein: 5.9 g/dL — ABNORMAL LOW (ref 6.5–8.1)

## 2020-06-21 LAB — CBC WITH DIFFERENTIAL/PLATELET
Abs Immature Granulocytes: 0.22 10*3/uL — ABNORMAL HIGH (ref 0.00–0.07)
Basophils Absolute: 0.1 10*3/uL (ref 0.0–0.1)
Basophils Relative: 1 %
Eosinophils Absolute: 0 10*3/uL (ref 0.0–0.5)
Eosinophils Relative: 0 %
HCT: 27.2 % — ABNORMAL LOW (ref 39.0–52.0)
Hemoglobin: 8.5 g/dL — ABNORMAL LOW (ref 13.0–17.0)
Immature Granulocytes: 2 %
Lymphocytes Relative: 16 %
Lymphs Abs: 2 10*3/uL (ref 0.7–4.0)
MCH: 27.2 pg (ref 26.0–34.0)
MCHC: 31.3 g/dL (ref 30.0–36.0)
MCV: 86.9 fL (ref 80.0–100.0)
Monocytes Absolute: 0.8 10*3/uL (ref 0.1–1.0)
Monocytes Relative: 6 %
Neutro Abs: 9.7 10*3/uL — ABNORMAL HIGH (ref 1.7–7.7)
Neutrophils Relative %: 75 %
Platelets: 625 10*3/uL — ABNORMAL HIGH (ref 150–400)
RBC: 3.13 MIL/uL — ABNORMAL LOW (ref 4.22–5.81)
RDW: 15 % (ref 11.5–15.5)
WBC: 12.7 10*3/uL — ABNORMAL HIGH (ref 4.0–10.5)
nRBC: 0 % (ref 0.0–0.2)

## 2020-06-21 LAB — MAGNESIUM: Magnesium: 2 mg/dL (ref 1.7–2.4)

## 2020-06-21 LAB — PHOSPHORUS: Phosphorus: 3.6 mg/dL (ref 2.5–4.6)

## 2020-06-21 LAB — C-REACTIVE PROTEIN: CRP: 10.4 mg/dL — ABNORMAL HIGH (ref <1.0)

## 2020-06-21 NOTE — Progress Notes (Signed)
[place] [facility] PROGRESS NOTE    Dwayne Barnes  IRS:[id number] DOB: Aug 23, 1950 DOA: 05/20/2020 PCP: [name], PA-C      Brief Narrative:  Dwayne Barnes is a 69 y.o. M with no significant PMHx who presented with nausea, weakness, found to have COVID-19 large PE.  Please see summary by Dr. Richardson Chiquito on 12/18, but in brief, patient stay complicated by cardiac arrest x2, C. difficile colitis and hematochezia.      Assessment & Plan:  Pulmonary embolism Deep venous thrombosis s/p IVC filter Status post cardiac arrest -Continue apixaban   Hematochezia Due to hemorrhoids, no further bleeding on apixaban. -Continue anusol, fiber.  Acute hypoxic respiratory failure due to pulmonary embolism cardiac arrest Resolved  Acute metabolic encephalopathy, resolved  C. difficile colitis -Continue vancomycin PO  Paroxysmal atrial fibrillation Rate controlled -Continue apixaban  Normocytic anemia Hgb stable at mid 8s.  Transaminitis  Hyponatremia Stable, no symptoms  Severe protein calorie malnutrition -Continue feeding supplement          Disposition: Status is: Inpatient  Remains inpatient appropriate because:Unsafe d/c plan   Dispo: The patient is from: Home              Anticipated d/c is to: SNF              Anticipated d/c date is: 1 day              Patient currently is medically stable to d/c.              MDM: The below labs and imaging reports were reviewed and summarized above.  Medication management as above.    DVT prophylaxis:  apixaban (ELIQUIS) tablet 5 mg  Code Status: FULL Family Communication: Called to daughter, no answer, mailbox full    Significant Events: 11/17>> Admit to [facility] for PE/COVID-19 infection 11/19>> PEA cardiac arrest x 2-given TPA-intubated-transfer to ICU 11/23>> Self extubated 11/25>> transfer to [facility] 11/29>> worsening diarrhea-CT abdomen with progressive colitis 12/14>> GI Bleeding noted so  GI was consulted and recommending continue Eliquis at this time and if patient continues to have persistent hematochezia they are planning a colonoscopy for further evaluation; they felt that the bleeding was from hemorrhoids  Significant studies: 11/18>>CT renal stone: non specific colitis, GGO's, left nephrolithiasis without obstructive uropathy. 11/18>>CTA chest: large PC in LLL, GGO's LLL. 11/20 echo>> EF 50-55%, RV systolic function severely reduced. 11/21>> B/L extremity Doppler: acute DVTin left femoral vein, acute mobile thrombus in the proximal femoral vein. 11/23>>CT Head: unremarkable 11/29>> CT abdomen/pelvis>> progressive colitis          Subjective: Patient feeling well.  No hematochezia, no abdominal pain, vomiting, diarrhea.  Stools formed.  No fever.  No headache, chest pain or dyspena.  Objective: Vitals:   06/19/20 2100 06/20/20 0500 06/20/20 1408 06/21/20 0130  BP: 94/67 110/80 113/68 106/71  Pulse: 87 90 83 85  Resp: 17 17 17    Temp: 99.2 F (37.3 C) 98 F (36.7 C) 99.3 F (37.4 C) 99 F (37.2 C)  TempSrc: Oral Oral  Oral  SpO2:   98% 97%  Weight:      Height:        Intake/Output Summary (Last 24 hours) at 06/21/2020 1346 Last data filed at 06/21/2020 0610 Gross per 24 hour  Intake 10 ml  Output 1825 ml  Net -1815 ml   Filed Weights   06/16/20 0520 06/17/20 0056 06/19/20 0500  Weight: 59.4 kg 59.1 kg 58.6 kg  Examination: General appearance: Thin adult male, alert and in no acute distress.  Lying in bed HEENT: Anicteric, conjunctiva pink, lids and lashes normal. No nasal deformity, discharge, epistaxis.  Lips moist, partially edentulous, OP moist, no oral lesions, hearing very diminished.   Skin: Warm and dry.  No jaundice.  No suspicious rashes or lesions. Cardiac: RRR, nl S1-S2, no murmurs appreciated.  Capillary refill is brisk.  JVP not visible.  No LE edema.  Radial  pulses 2+ and symmetric. Respiratory: Normal respiratory rate  and rhythm.  CTAB without rales or wheezes. Abdomen: Abdomen soft.  No TTP. No ascites, distension, hepatosplenomegaly.   MSK: No deformities or effusions. Neuro: Awake and alert.  EOMI, moves all extremities but with severe generalized weakness. Speech fluent.    Psych: Sensorium intact and responding to questions, attention normal. Affect normal.  Judgment and insight appear normal.    Data Reviewed: I have personally reviewed following labs and imaging studies:  CBC: Recent Labs  Lab 06/17/20 0433 06/18/20 0025 06/19/20 0327 06/20/20 0222 06/21/20 0028  WBC 10.1 11.6* 10.8* 11.1* 12.7*  NEUTROABS  --   --  8.0* 8.0* 9.7*  HGB 8.5* 8.3* 8.3* 8.3* 8.5*  HCT 26.1* 26.4* 26.6* 26.1* 27.2*  MCV 86.4 87.1 86.9 87.0 86.9  PLT 650* 674* 633* 635* 625*   Basic Metabolic Panel: Recent Labs  Lab 06/17/20 0433 06/18/20 0025 06/19/20 0327 06/20/20 0222 06/21/20 0028  NA 133* 131* 131* 132* 131*  K 4.2 4.2 4.1 4.2 4.6  CL 97* 97* 96* 95* 94*  CO2 26 25 26 26 26   GLUCOSE 99 107* 101* 95 110*  BUN 10 11 14 12 12   CREATININE 0.95 0.99 1.05 0.98 0.91  CALCIUM 8.5* 8.5* 8.4* 8.6* 8.6*  MG  --   --  2.0 1.9 2.0  PHOS  --   --  3.6 4.5 3.6   GFR: Estimated Creatinine Clearance: 64.4 mL/min (by C-G formula based on SCr of 0.91 mg/dL). Liver Function Tests: Recent Labs  Lab 06/19/20 0327 06/20/20 0222 06/21/20 0028  AST 56* 56* 63*  ALT 67* 68* 74*  ALKPHOS 223* 236* 229*  BILITOT 0.5 0.6 0.5  PROT 5.5* 5.8* 5.9*  ALBUMIN 1.6* 1.7* 1.7*   No results for input(s): LIPASE, AMYLASE in the last 168 hours. No results for input(s): AMMONIA in the last 168 hours. Coagulation Profile: No results for input(s): INR, PROTIME in the last 168 hours. Cardiac Enzymes: No results for input(s): CKTOTAL, CKMB, CKMBINDEX, TROPONINI in the last 168 hours. BNP (last 3 results) No results for input(s): PROBNP in the last 8760 hours. HbA1C: No results for input(s): HGBA1C in the last 72  hours. CBG: No results for input(s): GLUCAP in the last 168 hours. Lipid Profile: No results for input(s): CHOL, HDL, LDLCALC, TRIG, CHOLHDL, LDLDIRECT in the last 72 hours. Thyroid Function Tests: No results for input(s): TSH, T4TOTAL, FREET4, T3FREE, THYROIDAB in the last 72 hours. Anemia Panel: No results for input(s): VITAMINB12, FOLATE, FERRITIN, TIBC, IRON, RETICCTPCT in the last 72 hours. Urine analysis:    Component Value Date/Time   COLORURINE YELLOW 06/10/2020 1730   APPEARANCEUR HAZY (A) 06/10/2020 1730   LABSPEC 1.010 06/10/2020 1730   PHURINE 6.0 06/10/2020 1730   GLUCOSEU NEGATIVE 06/10/2020 1730   HGBUR NEGATIVE 06/10/2020 1730   BILIRUBINUR NEGATIVE 06/10/2020 1730   KETONESUR NEGATIVE 06/10/2020 1730   PROTEINUR NEGATIVE 06/10/2020 1730   UROBILINOGEN 0.2 04/18/2010 1851   NITRITE POSITIVE (A) 06/10/2020 1730  LEUKOCYTESUR LARGE (A) 06/10/2020 1730   Sepsis Labs: @LABRCNTIP (procalcitonin:4,lacticacidven:4)  ) Recent Results (from the past 240 hour(s))  Resp Panel by RT-PCR (Flu A&B, Covid) Nasopharyngeal Swab     Status: None   Collection Time: 06/19/20  8:17 AM   Specimen: Nasopharyngeal Swab; Nasopharyngeal(NP) swabs in vial transport medium  Result Value Ref Range Status   SARS Coronavirus 2 by RT PCR NEGATIVE NEGATIVE Final    Comment: (NOTE) SARS-CoV-2 target nucleic acids are NOT DETECTED.  The SARS-CoV-2 RNA is generally detectable in upper respiratory specimens during the acute phase of infection. The lowest concentration of SARS-CoV-2 viral copies this assay can detect is 138 copies/mL. A negative result does not preclude SARS-Cov-2 infection and should not be used as the sole basis for treatment or other patient management decisions. A negative result may occur with  improper specimen collection/handling, submission of specimen other than nasopharyngeal swab, presence of viral mutation(s) within the areas targeted by this assay, and inadequate  number of viral copies(<138 copies/mL). A negative result must be combined with clinical observations, patient history, and epidemiological information. The expected result is Negative.  Fact Sheet for Patients:  06/21/20  Fact Sheet for Healthcare Providers:  BloggerCourse.com  This test is no t yet approved or cleared by the [place]ious[place] FDA and  has been authorized for detection and/or diagnosis of SARS-CoV-2 by FDA under an Emergency Use Authorization (EUA). This EUA will remain  in effect (meaning this test can be used) for the duration of the COVID-19 declaration under Section 564(b)(1) of the Act, 21 U.S.C.section 360bbb-3(b)(1), unless the authorization is terminated  or revoked sooner.       Influenza A by PCR NEGATIVE NEGATIVE Final   Influenza B by PCR NEGATIVE NEGATIVE Final    Comment: (NOTE) The Xpert Xpress SARS-CoV-2/FLU/RSV plus assay is intended as an aid in the diagnosis of influenza from Nasopharyngeal swab specimens and should not be used as a sole basis for treatment. Nasal washings and aspirates are unacceptable for Xpert Xpress SARS-CoV-2/FLU/RSV testing.  Fact Sheet for Patients: [place]  Fact Sheet for Healthcare Providers: BloggerCourse.com  This test is not yet approved or cleared by the [place]ious[place] FDA and has been authorized for detection and/or diagnosis of SARS-CoV-2 by FDA under an Emergency Use Authorization (EUA). This EUA will remain in effect (meaning this test can be used) for the duration of the COVID-19 declaration under Section 564(b)(1) of the Act, 21 U.S.C. section 360bbb-3(b)(1), unless the authorization is terminated or revoked.  Performed at [facility], [address], [place], [place]          Radiology Studies: No results found.      Scheduled Meds: . apixaban  5 mg Oral BID   . famotidine  20 mg Oral Daily  . feeding supplement  237 mL Oral TID BM  . hydrocortisone  25 mg Rectal BID  . multivitamin with minerals  1 tablet Oral Daily  . psyllium  1 packet Oral Daily  . sodium chloride flush  10-40 mL Intracatheter Q12H  . vancomycin  125 mg Oral Q6H   Continuous Infusions:   LOS: 31 days    Time spent: 25 minutes    [id number], MD [facility] 06/21/2020, 1:46 PM     Please page though AMION or Epic secure chat:  For 06/23/2020, [employer]

## 2020-06-22 NOTE — TOC Progression Note (Addendum)
Transition of Care [facility]) - Progression Note    Patient Details  Name: Dwayne Barnes MRN: [id number] Date of Birth: 08-Nov-1950  [facility] of [facility]) CM/SW Contact  Dwayne Frederick, LCSW Phone Number: 06/22/2020, 9:10 AM  Clinical Narrative:   CSW spoke with pt daughter Dwayne Barnes.  Only one new bed offer was made--[facility].  Dwayne Barnes would like to just move forward with Dwayne Barnes.  CSW called Dwayne Barnes at [facility] to confirm she is restarting authorization today.  1030: Dwayne Barnes at [facility].  She will resubmit the auth today.  She will be out of office until 12/27, Dwayne Barnes will be covering admissions while she is gone, Dwayne Barnes cell: [phone].     Expected Discharge Plan: Skilled Nursing Facility Barriers to Discharge: Barriers Resolved  Expected Discharge Plan and Services Expected Discharge Plan: Skilled Nursing Facility In-house Referral: Clinical Social Work Discharge Planning Services: CM Consult Post Acute Care Choice: Skilled Nursing Facility Living arrangements for the past 2 months: Single Family Home Expected Discharge Date: 06/21/20                                     Social Determinants of Health (SDOH) Interventions    Readmission Risk Interventions No flowsheet data found.

## 2020-06-22 NOTE — Plan of Care (Signed)
  Problem: Education: Goal: Knowledge of risk factors and measures for prevention of condition will improve Outcome: Progressing   

## 2020-06-22 NOTE — Progress Notes (Signed)
[place] [facility] PROGRESS NOTE    Osborne OmanJack C Fussner  ZOX:[id number] DOB: 09/15/1950 DOA: 05/20/2020 PCP: Ladora DanielBeal, Sheri, PA-C      Brief Narrative:  Mr. Dwayne Barnes is a 69 y.o. M with no significant PMHx who presented with nausea, weakness, found to have COVID-19 and a large PE.    Please see summary by Dr. Marland McalpineSheikh on 12/18, but in brief, patient's illness complicated by cardiac arrest x2, C. difficile colitis and hematochezia.      Assessment & Plan:  Pulmonary embolism Deep venous thrombosis s/p IVC filter Status post cardiac arrest  Patient admitted and started on anticoagulation.  Suffered cardiac arrest twice and was transferred to the ICU and given tPA.  Echo showed severely reduced RV function.    Lower extremity US showed left DVT with mobile thrombus and so IVC filter placed on 11/21.    -Continue apixaban    C. difficile colitis Only one dose antibiotics Rocephin and Flagyl given on admission.  Paitent later developed leukocytosis, diarrhea and CT showed pan colitis.    Started on oral vancomycin and stool solidified. -Continue vancomycin PO   Hematochezia Patient was preparing for discharge 12/8 when he was noted to have recurrent bright red bowel movements.    GI were consulted, and this was found to be from hemorrhoids, likely some contribution from Cdiff.  His anticoagulant was stopped briefly, for about 48 hours, then resumed over a week prior to discharge without further bleeding, nor drop in hemoglobin.    -Continue anusol, fiber.  Acute hypoxic respiratory failure due to pulmonary embolism cardiac arrest Resolved  Acute metabolic encephalopathy Resolved  Paroxysmal atrial fibrillation Rate controlled -Continue apixaban  Normocytic anemia Hgb stable at mid 8s. -Repeat CBC in 2 weeks  Transaminitis -Repeat LFTs in 4 weeks  Hyponatremia Stable, no symptoms -Repeat BMP in 2 weeks  Severe protein calorie malnutrition As evidenced by  severe illness, severely reduced muscle mass and fat, thenar wasting and poor oral intake. -Continue feeding supplement  Fever Again fever noted yesterday, mild 100.44F.  Patient is without new sputum, chest pain, change in cough.  No urinary symptoms, no new abdominal pain. No chills malaise.  I suspect this is atelectasis given his prolonged immobility.  I certainly think giving him empiric antibiotics without a clear bacterial source would worsen his C difficile infection (which we KNOW is clinically significant), and the risk outweighs the benefit.          Disposition: Status is: Inpatient  Remains inpatient appropriate because:Unsafe d/c plan   Dispo: The patient is from: Home              Anticipated d/c is to: SNF              Anticipated d/c date is: 1 day              Patient currently is medically stable to d/c.              MDM: The below labs and imaging reports were reviewed and summarized above.  Medication management as above.    DVT prophylaxis:  apixaban (ELIQUIS) tablet 5 mg  Code Status: FULL Family Communication: Daughter by phone    Significant Events: 11/17>> [facility]OVID[facility]19 infection 11/19>> PEA cardiac arrest x 2-given TPA-intubated-transfer to ICU 11/21>> IVC filter placed 11/23>> Self extubated 11/25>> [facility]>> worsening diarrhea-CT abdomen with progressive colitis 12/14>> GI Bleeding noted so GI was consulted and recommending continue  Eliquis at this time and if patient continues to have persistent hematochezia they are planning a colonoscopy for further evaluation; they felt that the bleeding was from hemorrhoids  Significant studies: 11/18>>CT renal stone: non specific colitis, GGO's, left nephrolithiasis without obstructive uropathy. 11/18>>CTA chest: large PC in LLL, GGO's LLL. 11/20 echo>> EF 50-55%, RV systolic function severely reduced. 11/21>> B/L extremity Doppler: acute DVTin left femoral  vein, acute mobile thrombus in the proximal femoral vein. 11/23>>CT Head: unremarkable 11/29>> CT abdomen/pelvis>> progressive colitis          Subjective: Patient still feels well.  No new abdominal pain, diarrhea.  No more bowel movements, no melena or hematochezia.  Mild fever yesterday afternoon, no headache, myalgias, sore throat, malaise.  No chest pain, dyspnea, worsening cough, sputum.  No dysuria, urinary frequency.         Objective: Vitals:   06/21/20 0130 06/21/20 1418 06/21/20 2000 06/22/20 0600  BP: 106/71 104/66 138/78 103/67  Pulse: 85 98 99 93  Resp:  18 18 16   Temp: 99 F (37.2 C) (!) 100.7 F (38.2 C) 99.4 F (37.4 C) 99.8 F (37.7 C)  TempSrc: Oral  Oral Oral  SpO2: 97% 99% 95% 98%  Weight:      Height:        Intake/Output Summary (Last 24 hours) at 06/22/2020 1405 Last data filed at 06/22/2020 1327 Gross per 24 hour  Intake 10 ml  Output 950 ml  Net -940 ml   Filed Weights   06/16/20 0520 06/17/20 0056 06/19/20 0500  Weight: 59.4 kg 59.1 kg 58.6 kg    Examination: General appearance: Thin adult male, lying in bed, no acute distress, interactive, eating breakfast     HEENT:   Severely hard of hearing. Skin: Warm and dry, no suspicious rashes or lesions Cardiac: Regular rate and rhythm, no murmurs, no lower extremity edema, JVP normal. Respiratory: Normal respiratory rate and rhythm, lung sounds diminished bilateral bases, no wheezes or rales Abdomen: Abdomen soft without tenderness palpation or guarding, no suprapubic tenderness, ascites, distention, or hepatosplenomegaly MSK: Severe diffuse loss of subcutaneous muscle mass and fat, thenar wasting noted.  Temporal wasting noted. Neuro: Awake and alert, extraocular movements intact, moves all extremities with severe generalized weakness, speech fluent.  Very hard of hearing. Psych: Sensorium intact responding to questions, attention normal, affect normal, judgment insight appear  normal.         Data Reviewed: I have personally reviewed following labs and imaging studies:  CBC: Recent Labs  Lab 06/17/20 0433 06/18/20 0025 06/19/20 0327 06/20/20 0222 06/21/20 0028  WBC 10.1 11.6* 10.8* 11.1* 12.7*  NEUTROABS  --   --  8.0* 8.0* 9.7*  HGB 8.5* 8.3* 8.3* 8.3* 8.5*  HCT 26.1* 26.4* 26.6* 26.1* 27.2*  MCV 86.4 87.1 86.9 87.0 86.9  PLT 650* 674* 633* 635* 625*   Basic Metabolic Panel: Recent Labs  Lab 06/17/20 0433 06/18/20 0025 06/19/20 0327 06/20/20 0222 06/21/20 0028  NA 133* 131* 131* 132* 131*  K 4.2 4.2 4.1 4.2 4.6  CL 97* 97* 96* 95* 94*  CO2 26 25 26 26 26   GLUCOSE 99 107* 101* 95 110*  BUN 10 11 14 12 12   CREATININE 0.95 0.99 1.05 0.98 0.91  CALCIUM 8.5* 8.5* 8.4* 8.6* 8.6*  MG  --   --  2.0 1.9 2.0  PHOS  --   --  3.6 4.5 3.6   GFR: Estimated Creatinine Clearance: 64.4 mL/min (by C-G formula based on SCr of  0.91 mg/dL). Liver Function Tests: Recent Labs  Lab 06/19/20 0327 06/20/20 0222 06/21/20 0028  AST 56* 56* 63*  ALT 67* 68* 74*  ALKPHOS 223* 236* 229*  BILITOT 0.5 0.6 0.5  PROT 5.5* 5.8* 5.9*  ALBUMIN 1.6* 1.7* 1.7*   No results for input(s): LIPASE, AMYLASE in the last 168 hours. No results for input(s): AMMONIA in the last 168 hours. Coagulation Profile: No results for input(s): INR, PROTIME in the last 168 hours. Cardiac Enzymes: No results for input(s): CKTOTAL, CKMB, CKMBINDEX, TROPONINI in the last 168 hours. BNP (last 3 results) No results for input(s): PROBNP in the last 8760 hours. HbA1C: No results for input(s): HGBA1C in the last 72 hours. CBG: No results for input(s): GLUCAP in the last 168 hours. Lipid Profile: No results for input(s): CHOL, HDL, LDLCALC, TRIG, CHOLHDL, LDLDIRECT in the last 72 hours. Thyroid Function Tests: No results for input(s): TSH, T4TOTAL, FREET4, T3FREE, THYROIDAB in the last 72 hours. Anemia Panel: No results for input(s): VITAMINB12, FOLATE, FERRITIN, TIBC, IRON,  RETICCTPCT in the last 72 hours. Urine analysis:    Component Value Date/Time   COLORURINE YELLOW 06/10/2020 1730   APPEARANCEUR HAZY (A) 06/10/2020 1730   LABSPEC 1.010 06/10/2020 1730   PHURINE 6.0 06/10/2020 1730   GLUCOSEU NEGATIVE 06/10/2020 1730   HGBUR NEGATIVE 06/10/2020 1730   BILIRUBINUR NEGATIVE 06/10/2020 1730   KETONESUR NEGATIVE 06/10/2020 1730   PROTEINUR NEGATIVE 06/10/2020 1730   UROBILINOGEN 0.2 04/18/2010 1851   NITRITE POSITIVE (A) 06/10/2020 1730   LEUKOCYTESUR LARGE (A) 06/10/2020 1730   Sepsis Labs: @LABRCNTIP (procalcitonin:4,lacticacidven:4)  ) Recent Results (from the past 240 hour(s))  Resp Panel by RT-PCR (Flu A&B, Covid) Nasopharyngeal Swab     Status: None   Collection Time: 06/19/20  8:17 AM   Specimen: Nasopharyngeal Swab; Nasopharyngeal(NP) swabs in vial transport medium  Result Value Ref Range Status   SARS Coronavirus 2 by RT PCR NEGATIVE NEGATIVE Final    Comment: (NOTE) SARS-CoV-2 target nucleic acids are NOT DETECTED.  The SARS-CoV-2 RNA is generally detectable in upper respiratory specimens during the acute phase of infection. The lowest concentration of SARS-CoV-2 viral copies this assay can detect is 138 copies/mL. A negative result does not preclude SARS-Cov-2 infection and should not be used as the sole basis for treatment or other patient management decisions. A negative result may occur with  improper specimen collection/handling, submission of specimen other than nasopharyngeal swab, presence of viral mutation(s) within the areas targeted by this assay, and inadequate number of viral copies(<138 copies/mL). A negative result must be combined with clinical observations, patient history, and epidemiological information. The expected result is Negative.  Fact Sheet for Patients:  06/21/20  Fact Sheet for Healthcare Providers:  BloggerCourse.com  This test is no t yet  approved or cleared by the [employer] FDA and  has been authorized for detection and/or diagnosis of SARS-CoV-2 by FDA under an Emergency Use Authorization (EUA). This EUA will remain  in effect (meaning this test can be used) for the duration of the COVID-19 declaration under Section 564(b)(1) of the Act, 21 U.S.C.section 360bbb-3(b)(1), unless the authorization is terminated  or revoked sooner.       Influenza A by PCR NEGATIVE NEGATIVE Final   Influenza B by PCR NEGATIVE NEGATIVE Final    Comment: (NOTE) The Xpert Xpress SARS-CoV-2/FLU/RSV plus assay is intended as an aid in the diagnosis of influenza from Nasopharyngeal swab specimens and should not be used as a sole basis for treatment.  Nasal washings and aspirates are unacceptable for Xpert Xpress SARS-CoV-2/FLU/RSV testing.  Fact Sheet for Patients: BloggerCourse.com  Fact Sheet for Healthcare Providers: SeriousBroker.it  This test is not yet approved or cleared by the [place] FDA and has been authorized for detection and/or diagnosis of SARS-CoV-2 by FDA under an Emergency Use Authorization (EUA). This EUA will remain in effect (meaning this test can be used) for the duration of the COVID-19 declaration under Section 564(b)(1) of the Act, 21 U.S.C. section 360bbb-3(b)(1), unless the authorization is terminated or revoked.  Performed at [facility], [address] [place], [place] [address]          Radiology Studies: No results found.      Scheduled Meds: . apixaban  5 mg Oral BID  . famotidine  20 mg Oral Daily  . feeding supplement  237 mL Oral TID BM  . hydrocortisone  25 mg Rectal BID  . multivitamin with minerals  1 tablet Oral Daily  . psyllium  1 packet Oral Daily  . sodium chloride flush  10-40 mL Intracatheter Q12H  . vancomycin  125 mg Oral Q6H   Continuous Infusions:   LOS: 32 days    Time spent: 25  minutes    Alberteen Sam, MD [facility] 06/22/2020, 2:05 PM     Please page though AMION or Epic secure chat:  For [employer], Higher education careers adviser

## 2020-06-22 NOTE — Progress Notes (Signed)
Physical Therapy Treatment Patient Details Name: Dwayne Barnes MRN: [id number] DOB: Nov 14, 1950 Today's Date: 06/22/2020    History of Present Illness Pt is a 69 y.o. male admitted 05/20/20 with back pain, weakness and colitis; found to have (+) COVID-19 and LLL PE. Pt with 2x cardiac arrests on 11/19 and intubated; self-exubated 11/22. 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 Head CT 11/23 unremarkable. S/p femoral line removal 11/24. Course complicated by progressive diarrhea; abdominal CT 11/29 with progressive colitis. PMH includes HOH.    PT Comments    Pt agreeable to OOB mobility to and from bathroom today, adult brief donned to increase pt comfort and confidence during gait. Pt requiring close guard to light min assist to steady and assist during transfers, pt limited by deconditioning and multiple coughing bouts. Pt tolerated x3 transfers during session, slow to rise and steady secondary to LE weakness. PT continuing to recommend SNF post-acutely.    Follow Up Recommendations  SNF     Equipment Recommendations  3in1 (PT);Rolling walker with 5" wheels    Recommendations for Other Services       Precautions / Restrictions Precautions Precautions: Fall Precaution Comments: Extremely HOH use dry erase board in room (not utilized today, pt could hear pt with repeated and loud speaking); hearing aides ring throughout session Restrictions Weight Bearing Restrictions: No    Mobility  Bed Mobility Overal bed mobility: Needs Assistance Bed Mobility: Supine to Sit     Supine to sit: Min guard     General bed mobility comments: for safety, pt able to perform bridge motion to don brief  Transfers Overall transfer level: Needs assistance Equipment used: Rolling walker (2 wheeled) Transfers: Sit to/from Stand Sit to Stand: Min assist         General transfer comment: Min assist for power up, rise and steady especially from low seated position (toilet). STS x3,  from EOB, toilet, and recliner.  Ambulation/Gait Ambulation/Gait assistance: Min guard Gait Distance (Feet): 15 Feet (x2 - to and from bathroom) Assistive device: Rolling walker (2 wheeled) Gait Pattern/deviations: Step-through pattern;Decreased stride length;Trunk flexed Gait velocity: decr   General Gait Details: min guard for safety, verbal cuing for upright posture, positioning in RW, room and bathroom navigation. DOE 2/4 with mobility, multiple coughing bouts throughout mobility   Stairs             Wheelchair Mobility    Modified Rankin (Stroke Patients Only)       Balance Overall balance assessment: Needs assistance Sitting-balance support: Feet supported;Bilateral upper extremity supported Sitting balance-Leahy Scale: Good     Standing balance support: Bilateral upper extremity supported Standing balance-Leahy Scale: Poor Standing balance comment: reliant on external support                            Cognition Arousal/Alertness: Awake/alert Behavior During Therapy: WFL for tasks assessed/performed Overall Cognitive Status: Within Functional Limits for tasks assessed                                        Exercises General Exercises - Lower Extremity Long Arc Quad: AROM;Both;15 reps;Seated    General Comments        Pertinent Vitals/Pain Pain Assessment: Faces Faces Pain Scale: Hurts little more Pain Location: abdominal area - diarrhea Pain Descriptors / Indicators: Discomfort Pain Intervention(s): Limited activity  within patient's tolerance;Monitored during session;Repositioned    Home Living                      Prior Function            PT Goals (current goals can now be found in the care plan section) Acute Rehab PT Goals Patient Stated Goal: to get stronger PT Goal Formulation: With patient Time For Goal Achievement: 06/29/20 Potential to Achieve Goals: Good Progress towards PT goals: Progressing  toward goals    Frequency    Min 2X/week      PT Plan Current plan remains appropriate    Co-evaluation              AM-PAC PT "6 Clicks" Mobility   Outcome Measure  Help needed turning from your back to your side while in a flat bed without using bedrails?: A Little Help needed moving from lying on your back to sitting on the side of a flat bed without using bedrails?: A Little Help needed moving to and from a bed to a chair (including a wheelchair)?: A Little Help needed standing up from a chair using your arms (e.g., wheelchair or bedside chair)?: A Little Help needed to walk in hospital room?: A Little Help needed climbing 3-5 steps with a railing? : A Little 6 Click Score: 18    End of Session   Activity Tolerance: Patient limited by fatigue Patient left: with call bell/phone within reach;in chair;with chair alarm set Nurse Communication: Mobility status PT Visit Diagnosis: Muscle weakness (generalized) (M62.81);Difficulty in walking, not elsewhere classified (R26.2)     Time: 0962-8366 PT Time Calculation (min) (ACUTE ONLY): 21 min  Charges:  $Gait Training: 8-22 mins                     Marye Round, PT Acute Rehabilitation Services Pager [phone]  Office [phone]    Makenize Messman E Christain [place] 06/22/2020, 11:30 AM

## 2020-06-23 ENCOUNTER — Inpatient Hospital Stay (HOSPITAL_COMMUNITY): Payer: Medicare HMO

## 2020-06-23 LAB — CBC
HCT: 26.5 % — ABNORMAL LOW (ref 39.0–52.0)
Hemoglobin: 8.4 g/dL — ABNORMAL LOW (ref 13.0–17.0)
MCH: 27.2 pg (ref 26.0–34.0)
MCHC: 31.7 g/dL (ref 30.0–36.0)
MCV: 85.8 fL (ref 80.0–100.0)
Platelets: 571 10*3/uL — ABNORMAL HIGH (ref 150–400)
RBC: 3.09 MIL/uL — ABNORMAL LOW (ref 4.22–5.81)
RDW: 14.7 % (ref 11.5–15.5)
WBC: 18.6 10*3/uL — ABNORMAL HIGH (ref 4.0–10.5)
nRBC: 0 % (ref 0.0–0.2)

## 2020-06-23 LAB — PROCALCITONIN: Procalcitonin: 0.17 ng/mL

## 2020-06-23 NOTE — Progress Notes (Signed)
[facility] PROGRESS NOTE    Dwayne Barnes  WUJ:[id number] DOB: 09-Feb-1951 DOA: 05/20/2020 PCP: [name], PA-C      Brief Narrative:  Dwayne Barnes is a 69 y.o. M with no significant PMHx who presented with nausea, weakness, found to have COVID-19 and a large PE.    Please see summary by Dr. Marland Mcalpine on 12/18, but in brief, patient's illness complicated by cardiac arrest x2, C. difficile colitis and hematochezia.  Then prolonged hospitalization due to immobility and deconditioning.    Significant Events: 11/17>> Admit to [facility] for PE/COVID-19 infection 11/19>> PEA cardiac arrest x 2-given TPA-intubated-transfer to ICU 11/21>> IVC filter placed 11/23>> Self extubated 11/25>> transferred out of ICU 11/29>> new diarrhea-CT abdomen with progressive colitis C diff positive, started on oral vancomycin 12/8>> discharge planned but patient with recurrent fever due to atelectasis and so discharge delayed 12/14>> discharge planned again, but had new red GI Bleeding noted so GI was consulted; after evaluation, felt that the bleeding was from hemorrhoids  Significant studies: 11/18>>CT renal stone: non specific colitis, GGO's, left nephrolithiasis without obstructive uropathy. 11/18>>CTA chest: large PC in LLL, GGO's LLL. 11/20 echo>> EF 50-55%, RV systolic function severely reduced. 11/21>> B/L extremity Doppler: acute DVTin left femoral vein, acute mobile thrombus in the proximal femoral vein. 11/23>>CT Head: unremarkable 11/29>> CT abdomen/pelvis>> progressive colitis           Assessment & Plan:  Pulmonary embolism Deep venous thrombosis s/p IVC filter Status post cardiac arrest  Patient admitted and started on anticoagulation.  Echo showed severely reduced RV function.  Unfortunately, although initially stable, suffered cardiac arrest and was transferred to the ICU and given tPA.    Subsequently stabilized.  Lower extremity US showed left DVT with  mobile thrombus and so IVC filter placed on 11/21.    -Continue apixaban    C. difficile colitis Only one dose antibiotics Rocephin and Flagyl given on admission.  Paitent later developed leukocytosis, diarrhea and CT showed pan colitis.  Started on oral vancomycin  Has now completed 10 day course, stools have solidified.   -Stop vancomycin  Hematochezia due to hemorrhoids Patient was preparing for discharge 12/14 when he was noted to have recurrent bright red bowel movements.    GI were consulted, and this was found to be from hemorrhoids, likely some contribution from Cdiff.  His anticoagulant was stopped briefly, for about 48 hours, then resumed over a week prior to discharge without further bleeding, nor drop in hemoglobin.    -Continue anusol, fiber.  Acute hypoxic respiratory failure due to pulmonary embolism cardiac arrest Resolved  Acute metabolic encephalopathy Resolved  Paroxysmal atrial fibrillation Rate controlled -Continue apixaban   Normocytic anemia Hgb stable at mid 8s. -Repeat CBC in 2 weeks  Transaminitis -Repeat LFTs in 4 weeks  Hyponatremia Stable, no symptoms -Repeat BMP in 2 weeks  Severe protein calorie malnutrition As evidenced by severe illness, severely reduced muscle mass and fat, thenar wasting and poor oral intake. -Continue feeding supplement  Fever and leukocytosis Patient with fever ~12/8, given one dose vancomycin and cephalexin at that time (in setting of clinical aspiration), and no further doses given patient's active and uncontrolled Cdiff.  Patient defervesced without fever or leukocytosis for over a week.  Two days ago, patient had isolated low grade fever again, 100.30F, with rising white blood cell count.  Procalcitonin low, antibiotics discouraged per LRTI algorithm, and more so, this would be inadvisable in the setting of NO clinical symptoms and  recovering from Cdiff infection.    Suspect this is from atelectasis, given the  patient's profound immobility.  MOnitored for last 48 hours and no further fever, and no developing symptoms of cough, sputum, chest pain.  Chest x-ray today with small effusions, basilar opacities consistent with atelectasis.             Disposition: Status is: Inpatient  Remains inpatient appropriate because:Unsafe d/c plan   Dispo: The patient is from: Home              Anticipated d/c is to: SNF              Anticipated d/c date is: 1 day              Patient currently is medically stable to d/c.              MDM: The below labs and imaging reports were reviewed and summarized above.  Medication management as above.    DVT prophylaxis:  apixaban (ELIQUIS) tablet 5 mg  Code Status: FULL Family Communication: Daughter by phone         Subjective: Patient without new sputum, chest pain, change in cough.  No headache, myalgias, sore throat, congestion.  No melena, hematochezia.  Stools solid.  No urinary symptoms.          Objective: Vitals:   06/22/20 1600 06/22/20 2104 06/23/20 0504 06/23/20 1534  BP: (!) 93/57 106/61 99/63 119/62  Pulse: 94 97 93 94  Resp: 20 20 18    Temp: 99.4 F (37.4 C) 99.9 F (37.7 C) 97.7 F (36.5 C) 100.3 F (37.9 C)  TempSrc:  Oral Oral   SpO2: 97% 99% 98% 98%  Weight:      Height:        Intake/Output Summary (Last 24 hours) at 06/23/2020 1750 Last data filed at 06/23/2020 1700 Gross per 24 hour  Intake 120 ml  Output 600 ml  Net -480 ml   Filed Weights   06/16/20 0520 06/17/20 0056 06/19/20 0500  Weight: 59.4 kg 59.1 kg 58.6 kg    Examination: General appearance: Thin adult male, lying in bed, no obvious distress     HEENT:   Severely hard of hearing.  No nasal deformity, discharge, or epistaxis. Skin:  Cardiac: RRR, no murmurs, no lower extremity edema Respiratory: Normal respiratory rate and rhythm, lung sounds diminished, bilateral bases, no rales, no wheezing. Abdomen: Abdomen soft without  tenderness palpation or guarding. MSK:  Neuro: Awake and alert, extraocular movements intact, moves all extremities with generalized weakness, speech fluent.  Very hard of hearing Psych: Sensorium intact responding to questions, attention normal, affect normal, judgment insight appear normal           Data Reviewed: I have personally reviewed following labs and imaging studies:  CBC: Recent Labs  Lab 06/18/20 0025 06/19/20 0327 06/20/20 0222 06/21/20 0028 06/23/20 0140  WBC 11.6* 10.8* 11.1* 12.7* 18.6*  NEUTROABS  --  8.0* 8.0* 9.7*  --   HGB 8.3* 8.3* 8.3* 8.5* 8.4*  HCT 26.4* 26.6* 26.1* 27.2* 26.5*  MCV 87.1 86.9 87.0 86.9 85.8  PLT 674* 633* 635* 625* 571*   Basic Metabolic Panel: Recent Labs  Lab 06/17/20 0433 06/18/20 0025 06/19/20 0327 06/20/20 0222 06/21/20 0028  NA 133* 131* 131* 132* 131*  K 4.2 4.2 4.1 4.2 4.6  CL 97* 97* 96* 95* 94*  CO2 26 25 26 26 26   GLUCOSE 99 107* 101* 95 110*  BUN  10 11 14 12 12   CREATININE 0.95 0.99 1.05 0.98 0.91  CALCIUM 8.5* 8.5* 8.4* 8.6* 8.6*  MG  --   --  2.0 1.9 2.0  PHOS  --   --  3.6 4.5 3.6   GFR: Estimated Creatinine Clearance: 64.4 mL/min (by C-G formula based on SCr of 0.91 mg/dL). Liver Function Tests: Recent Labs  Lab 06/19/20 0327 06/20/20 0222 06/21/20 0028  AST 56* 56* 63*  ALT 67* 68* 74*  ALKPHOS 223* 236* 229*  BILITOT 0.5 0.6 0.5  PROT 5.5* 5.8* 5.9*  ALBUMIN 1.6* 1.7* 1.7*   No results for input(s): LIPASE, AMYLASE in the last 168 hours. No results for input(s): AMMONIA in the last 168 hours. Coagulation Profile: No results for input(s): INR, PROTIME in the last 168 hours. Cardiac Enzymes: No results for input(s): CKTOTAL, CKMB, CKMBINDEX, TROPONINI in the last 168 hours. BNP (last 3 results) No results for input(s): PROBNP in the last 8760 hours. HbA1C: No results for input(s): HGBA1C in the last 72 hours. CBG: No results for input(s): GLUCAP in the last 168 hours. Lipid  Profile: No results for input(s): CHOL, HDL, LDLCALC, TRIG, CHOLHDL, LDLDIRECT in the last 72 hours. Thyroid Function Tests: No results for input(s): TSH, T4TOTAL, FREET4, T3FREE, THYROIDAB in the last 72 hours. Anemia Panel: No results for input(s): VITAMINB12, FOLATE, FERRITIN, TIBC, IRON, RETICCTPCT in the last 72 hours. Urine analysis:    Component Value Date/Time   COLORURINE YELLOW 06/10/2020 1730   APPEARANCEUR HAZY (A) 06/10/2020 1730   LABSPEC 1.010 06/10/2020 1730   PHURINE 6.0 06/10/2020 1730   GLUCOSEU NEGATIVE 06/10/2020 1730   HGBUR NEGATIVE 06/10/2020 1730   BILIRUBINUR NEGATIVE 06/10/2020 1730   KETONESUR NEGATIVE 06/10/2020 1730   PROTEINUR NEGATIVE 06/10/2020 1730   UROBILINOGEN 0.2 04/18/2010 1851   NITRITE POSITIVE (A) 06/10/2020 1730   LEUKOCYTESUR LARGE (A) 06/10/2020 1730   Sepsis Labs: @LABRCNTIP (procalcitonin:4,lacticacidven:4)  ) Recent Results (from the past 240 hour(s))  Resp Panel by RT-PCR (Flu A&B, Covid) Nasopharyngeal Swab     Status: None   Collection Time: 06/19/20  8:17 AM   Specimen: Nasopharyngeal Swab; Nasopharyngeal(NP) swabs in vial transport medium  Result Value Ref Range Status   SARS Coronavirus 2 by RT PCR NEGATIVE NEGATIVE Final    Comment: (NOTE) SARS-CoV-2 target nucleic acids are NOT DETECTED.  The SARS-CoV-2 RNA is generally detectable in upper respiratory specimens during the acute phase of infection. The lowest concentration of SARS-CoV-2 viral copies this assay can detect is 138 copies/mL. A negative result does not preclude SARS-Cov-2 infection and should not be used as the sole basis for treatment or other patient management decisions. A negative result may occur with  improper specimen collection/handling, submission of specimen other than nasopharyngeal swab, presence of viral mutation(s) within the areas targeted by this assay, and inadequate number of viral copies(<138 copies/mL). A negative result must be combined  with clinical observations, patient history, and epidemiological information. The expected result is Negative.  Fact Sheet for Patients:   Fact Sheet for Healthcare Providers:  06/21/20  This test is no t yet approved or cleared by the [employer] FDA and  has been authorized for detection and/or diagnosis of SARS-CoV-2 by FDA under an Emergency Use Authorization (EUA). This EUA will remain  in effect (meaning this test can be used) for the duration of the COVID-19 declaration under Section 564(b)(1) of the Act, 21 U.S.C.section 360bbb-3(b)(1), unless the authorization is terminated  or revoked sooner.  Influenza A by PCR NEGATIVE NEGATIVE Final   Influenza B by PCR NEGATIVE NEGATIVE Final    Comment: (NOTE) The Xpert Xpress SARS-CoV-2/FLU/RSV plus assay is intended as an aid in the diagnosis of influenza from Nasopharyngeal swab specimens and should not be used as a sole basis for treatment. Nasal washings and aspirates are unacceptable for Xpert Xpress SARS-CoV-2/FLU/RSV testing.  Fact Sheet for Patients: [employer]com  Fact Sheet for Healthcare Providers: [employer]ious[employer]ker.it  This test is not yet approved or cleared by the [place] FDA and has been authorized for detection and/or diagnosis of SARS-CoV-2 by FDA under an Emergency Use Authorization (EUA). This EUA will remain in effect (meaning this test can be used) for the duration of the COVID-19 declaration under Section 564(b)(1) of the Act, 21 U.S.C. section 360bbb-3(b)(1), unless the authorization is terminated or revoked.  Performed at [facility], [address] [place], [place] [address]          Radiology Studies: DG Chest 2 View  Result Date: 06/23/2020 CLINICAL DATA:  Leukocytosis, cough EXAM: CHEST - 2 VIEW COMPARISON:  06/13/2020 FINDINGS: Normal heart  size, mediastinal contours, and pulmonary vascularity. Bibasilar pleural effusions Accompanying atelectasis versus consolidation in lower lobes. Upper lungs clear. No pneumothorax or acute osseous findings. IMPRESSION: Bibasilar pleural effusions and lower lobe atelectasis versus consolidation. Electronically Signed   By: Ulyses Southward M.D.   On: 06/23/2020 08:39        Scheduled Meds:  apixaban  5 mg Oral BID   famotidine  20 mg Oral Daily   feeding supplement  237 mL Oral TID BM   hydrocortisone  25 mg Rectal BID   multivitamin with minerals  1 tablet Oral Daily   psyllium  1 packet Oral Daily   sodium chloride flush  10-40 mL Intracatheter Q12H   vancomycin  125 mg Oral Q6H   Continuous Infusions:   LOS: 33 days    Time spent: 25 minutes    Alberteen Sam, MD [facility] 06/23/2020, 5:50 PM     Please page though AMION or Epic secure chat:  For [employer], Higher education careers adviser

## 2020-06-23 NOTE — Progress Notes (Signed)
Physical Therapy Treatment Patient Details Name: Dwayne Barnes MRN: [id number] DOB: 1950/12/05 Today's Date: 06/23/2020    History of Present Illness Pt is a 69 y.o. male admitted 05/20/20 with back pain, weakness and colitis; found to have (+) COVID-19 and LLL PE. Pt with 2x cardiac arrests on 11/19 and intubated; self-exubated 11/22. 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 Head CT 11/23 unremarkable. S/p femoral line removal 11/24. Course complicated by progressive diarrhea; abdominal CT 11/29 with progressive colitis. PMH includes HOH.    PT Comments    Patient received in bed, agrees to PT session. He requires min assist for bed mobility, transfers and gait. Patient had lob x2 during ambulation and is quite fatigued after walking 50 feet. Min assist needed for safety. He will continue to benefit from skilled PT while here to improve functional independence, activity tolerance and strength.     Follow Up Recommendations  SNF     Equipment Recommendations  3in1 (PT);Rolling walker with 5" wheels    Recommendations for Other Services       Precautions / Restrictions Precautions Precautions: Fall Precaution Comments: Extremely HOH use dry erase board in room Restrictions Weight Bearing Restrictions: No    Mobility  Bed Mobility Overal bed mobility: Needs Assistance Bed Mobility: Supine to Sit;Sit to Supine     Supine to sit: Min assist Sit to supine: Min assist   General bed mobility comments: light MIN A to elevate BLEs back to bed d/t fatigue  Transfers Overall transfer level: Needs assistance Equipment used: Rolling walker (2 wheeled) Transfers: Sit to/from Stand Sit to Stand: Min assist Stand pivot transfers: Min assist       General transfer comment: min A to power into standing  Ambulation/Gait Ambulation/Gait assistance: Min assist Gait Distance (Feet): 50 Feet Assistive device: Rolling walker (2 wheeled) Gait Pattern/deviations:  Step-through pattern;Decreased step length - right;Decreased step length - left;Decreased stride length;Trunk flexed Gait velocity: decr   General Gait Details: patient had 2 lob with ambulation 50 feet. Required min/ mod assist to recover.   Stairs             Wheelchair Mobility    Modified Rankin (Stroke Patients Only)       Balance Overall balance assessment: Needs assistance Sitting-balance support: Feet supported Sitting balance-Leahy Scale: Good     Standing balance support: Bilateral upper extremity supported;During functional activity Standing balance-Leahy Scale: Poor Standing balance comment: reliant on BUE support and assist                            Cognition Arousal/Alertness: Awake/alert Behavior During Therapy: WFL for tasks assessed/performed Overall Cognitive Status: Difficult to assess                                 General Comments: WFL for mobility tasks, utilized white board d/t hearing deficits, slightly slow to process but likely d/t hearing deficits      Exercises Other Exercises Other Exercises: B LE: SLR, heel slides, hip abd/add x 5 reps each    General Comments        Pertinent Vitals/Pain Pain Assessment: No/denies pain    Home Living                      Prior Function  PT Goals (current goals can now be found in the care plan section) Acute Rehab PT Goals Patient Stated Goal: to get stronger PT Goal Formulation: With patient Time For Goal Achievement: 06/29/20 Potential to Achieve Goals: Good Progress towards PT goals: Progressing toward goals    Frequency    Min 2X/week      PT Plan Current plan remains appropriate    Co-evaluation              AM-PAC PT "6 Clicks" Mobility   Outcome Measure  Help needed turning from your back to your side while in a flat bed without using bedrails?: A Little Help needed moving from lying on your back to sitting on the  side of a flat bed without using bedrails?: A Little Help needed moving to and from a bed to a chair (including a wheelchair)?: A Little Help needed standing up from a chair using your arms (e.g., wheelchair or bedside chair)?: A Little Help needed to walk in hospital room?: A Lot Help needed climbing 3-5 steps with a railing? : A Little 6 Click Score: 17    End of Session Equipment Utilized During Treatment: Gait belt Activity Tolerance: Patient limited by fatigue Patient left: in bed;with call bell/phone within reach;with bed alarm set Nurse Communication: Mobility status PT Visit Diagnosis: Muscle weakness (generalized) (M62.81);Difficulty in walking, not elsewhere classified (R26.2)     Time: 0177-9390 PT Time Calculation (min) (ACUTE ONLY): 25 min  Charges:  $Gait Training: 8-22 mins $Therapeutic Exercise: 8-22 mins                     Shahidah Nesbitt, PT, GCS 06/23/20,3:35 PM

## 2020-06-23 NOTE — Progress Notes (Signed)
Occupational Therapy Treatment Patient Details Name: Dwayne Barnes MRN: [id number] DOB: 1950-10-13 Today's Date: 06/23/2020    History of present illness Pt is a 69 y.o. male admitted 05/20/20 with back pain, weakness and colitis; found to have (+) COVID-19 and LLL PE. Pt with 2x cardiac arrests on 11/19 and intubated; self-exubated 11/22. 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 Head CT 11/23 unremarkable. S/p femoral line removal 11/24. Course complicated by progressive diarrhea; abdominal CT 11/29 with progressive colitis. PMH includes HOH.   OT comments  Pt making steady progress towards OT goals this session. Session focus on functional mobility as precursor to higher level BADLs. Pt reports fatigue from sitting in chair 2 hours requesting to return back to bed. Pt required up to MIN A for functional mobility with RW back to bed. Pt continues to present with increased pain from frequent stools and baseline hearing deficits. Pt would continue to benefit from skilled occupational therapy while admitted and after d/c to address the below listed limitations in order to improve overall functional mobility and facilitate independence with BADL participation. DC plan remains appropriate, will follow acutely per POC.     Follow Up Recommendations  SNF    Equipment Recommendations  3 in 1 bedside commode    Recommendations for Other Services      Precautions / Restrictions Precautions Precautions: Fall Precaution Comments: Extremely HOH use dry erase board in room Restrictions Weight Bearing Restrictions: No       Mobility Bed Mobility Overal bed mobility: Needs Assistance Bed Mobility: Sit to Supine       Sit to supine: Min assist   General bed mobility comments: light MIN A to elevate BLEs back to bed d/t fatigue  Transfers Overall transfer level: Needs assistance Equipment used: Rolling walker (2 wheeled) Transfers: Sit to/from [employer] Sit to  Stand: Min assist Stand pivot transfers: Min guard       General transfer comment: min A to power into standing, min guard for tranfser mostly for line mgmt and safety    Balance Overall balance assessment: Needs assistance Sitting-balance support: Feet supported;No upper extremity supported Sitting balance-Leahy Scale: Fair     Standing balance support: Bilateral upper extremity supported Standing balance-Leahy Scale: Poor Standing balance comment: reliant on BUE support                           ADL either performed or assessed with clinical judgement   ADL Overall ADL's : Needs assistance/impaired                         Toilet Transfer: Min guard;Ambulation;RW;Stand-pivot Toilet Transfer Details (indicate cue type and reason): simulated via functional mobility         Functional mobility during ADLs: Min guard;Rolling walker General ADL Comments: pt reports fatigue after sitting in recliner 2 hours, assisted pt back to bed with MIN guard with RW, pt continues to be limited by increased pain from frequent stools and baseline hearing deficits but making progress towards OT goals     Vision Patient Visual Report: No change from baseline     Perception     Praxis      Cognition Arousal/Alertness: Awake/alert Behavior During Therapy: WFL for tasks assessed/performed Overall Cognitive Status: Difficult to assess  General Comments: WFL for mobility tasks, utilized white board d/t hearing deficits, slightly slow to process but likely d/t hearing deficits        Exercises     Shoulder Instructions       General Comments      Pertinent Vitals/ Pain       Pain Assessment: No/denies pain  Home Living                                          Prior Functioning/Environment              Frequency  Min 2X/week        Progress Toward Goals  OT Goals(current goals can  now be found in the care plan section)  Progress towards OT goals: Progressing toward goals  Acute Rehab OT Goals Patient Stated Goal: to get stronger Time For Goal Achievement: 06/30/20 Potential to Achieve Goals: Good  Plan Discharge plan remains appropriate;Frequency remains appropriate    Co-evaluation                 AM-PAC OT "6 Clicks" Daily Activity     Outcome Measure   Help from another person eating meals?: None Help from another person taking care of personal grooming?: A Little Help from another person toileting, which includes using toliet, bedpan, or urinal?: A Lot Help from another person bathing (including washing, rinsing, drying)?: A Little Help from another person to put on and taking off regular upper body clothing?: A Little Help from another person to put on and taking off regular lower body clothing?: A Lot 6 Click Score: 17    End of Session Equipment Utilized During Treatment: Rolling walker  OT Visit Diagnosis: Unsteadiness on feet (R26.81);Other abnormalities of gait and mobility (R26.89);Muscle weakness (generalized) (M62.81)   Activity Tolerance Patient limited by fatigue   Patient Left in bed;with call bell/phone within reach;with bed alarm set   Nurse Communication Mobility status        Time: 1610-9604 OT Time Calculation (min): 13 min  Charges: OT General Charges $OT Visit: 1 Visit OT Treatments $Therapeutic Activity: 8-22 mins  Audery Amel., COTA/L Acute Rehabilitation Services [phone]    Angelina Pih 06/23/2020, 12:16 PM

## 2020-06-23 NOTE — TOC Progression Note (Signed)
Transition of Care [facility]) - Progression Note    Patient Details  Name: Dwayne Barnes MRN: [id number] Date of Birth: [birth year]  [facility]) CM/SW Contact  Mearl Latin, LCSW Phone Number: 06/23/2020, 9:21 AM  Clinical Narrative:    CSW contacted Annabelle Harman with [facility]; Insurance closed the case yesterday and requested she start auth again today if patient is medically stable, which she is doing.    Expected Discharge Plan: Skilled Nursing Facility Barriers to Discharge: Barriers Resolved  Expected Discharge Plan and Services Expected Discharge Plan: Skilled Nursing Facility In-house Referral: Clinical Social Work Discharge Planning Services: CM Consult Post Acute Care Choice: Skilled Nursing Facility Living arrangements for the past 2 months: Single Family Home Expected Discharge Date: 06/21/20                                     Social Determinants of Health (SDOH) Interventions    Readmission Risk Interventions No flowsheet data found.

## 2020-06-24 LAB — BASIC METABOLIC PANEL
Anion gap: 11 (ref 5–15)
BUN: 11 mg/dL (ref 8–23)
CO2: 26 mmol/L (ref 22–32)
Calcium: 8.9 mg/dL (ref 8.9–10.3)
Chloride: 92 mmol/L — ABNORMAL LOW (ref 98–111)
Creatinine, Ser: 0.87 mg/dL (ref 0.61–1.24)
GFR, Estimated: >60 mL/min (ref >60)
Glucose, Bld: 112 mg/dL — ABNORMAL HIGH (ref 70–99)
Potassium: 4 mmol/L (ref 3.5–5.1)
Sodium: 129 mmol/L — ABNORMAL LOW (ref 135–145)

## 2020-06-24 LAB — CBC WITH DIFFERENTIAL/PLATELET
Abs Immature Granulocytes: 0.12 10*3/uL — ABNORMAL HIGH (ref 0.00–0.07)
Basophils Absolute: 0 10*3/uL (ref 0.0–0.1)
Basophils Relative: 0 %
Eosinophils Absolute: 0 10*3/uL (ref 0.0–0.5)
Eosinophils Relative: 0 %
HCT: 28 % — ABNORMAL LOW (ref 39.0–52.0)
Hemoglobin: 8.7 g/dL — ABNORMAL LOW (ref 13.0–17.0)
Immature Granulocytes: 1 %
Lymphocytes Relative: 13 %
Lymphs Abs: 2.2 10*3/uL (ref 0.7–4.0)
MCH: 26.8 pg (ref 26.0–34.0)
MCHC: 31.1 g/dL (ref 30.0–36.0)
MCV: 86.2 fL (ref 80.0–100.0)
Monocytes Absolute: 0.7 10*3/uL (ref 0.1–1.0)
Monocytes Relative: 4 %
Neutro Abs: 14.1 10*3/uL — ABNORMAL HIGH (ref 1.7–7.7)
Neutrophils Relative %: 82 %
Platelets: 634 10*3/uL — ABNORMAL HIGH (ref 150–400)
RBC: 3.25 MIL/uL — ABNORMAL LOW (ref 4.22–5.81)
RDW: 14.8 % (ref 11.5–15.5)
WBC: 17.1 10*3/uL — ABNORMAL HIGH (ref 4.0–10.5)
nRBC: 0 % (ref 0.0–0.2)

## 2020-06-24 NOTE — Progress Notes (Signed)
Nutrition Follow-up  DOCUMENTATION CODES:   Underweight,Severe malnutrition in context of acute illness/injury  INTERVENTION:    Ensure Enlive po TID, each supplement provides 350 kcal and 20 grams of protein  MVI daily   NUTRITION DIAGNOSIS:   Severe Malnutrition related to acute illness (COVID 19/colitits) as evidenced by severe fat depletion,severe muscle depletion.  Ongoing  GOAL:   Patient will meet greater than or equal to 90% of their needs  Progressing  MONITOR:   PO intake,Supplement acceptance,Diet advancement,Skin,Weight trends,I & O's,Labs  REASON FOR ASSESSMENT:   Ventilator,Consult Enteral/tube feeding initiation and management  ASSESSMENT:   69 yo male with no known PMH presents with N/V, weakness and back and admitted with COVID 19 pneumonia on 11/18, had PEA arrest on 11/19 and required intubation.  11/17- admit 11/19- PEA arrest, intubated 11/23- self extubated  11/29- worsening diarrhea, progressive colitis 12/14- rectal bleeding found to be hemorrhoids   Pt reports appetite remains stable. No meals documented since 12/14. Reports eating 100% of breakfast this am. Taking Ensure TID. Awaiting SNF bed.   Admission weight: 65.8 kg  Current weight: 58.6 kg   I/O: 1150 ml x 24 hrs   Medications: MVI with minerals, metamucil Labs: Na 129 (L) LFTs trending up   Diet Order:   Diet Order            DIET DYS 3 Room service appropriate? No; Fluid consistency: Thin; Fluid restriction: 1200 mL Fluid  Diet effective now                 EDUCATION NEEDS:   Education needs have been addressed  Skin:  Skin Assessment: Reviewed RN Assessment Skin Integrity Issues:: Other (Comment) Other: puncture R throat, skin tear L buttocks  Last BM:  12/21  Height:   Ht Readings from Last 1 Encounters:  05/22/20 6' (1.829 m)    Weight:   Wt Readings from Last 1 Encounters:  06/19/20 58.6 kg    Ideal Body Weight:  80.9 kg  BMI:  Body mass  index is 17.52 kg/m.  Estimated Nutritional Needs:   Kcal:  8144-8185 kcals  Protein:  95-130 g  Fluid:  1.2 L/day  Vanessa Kick RD, LDN Clinical Nutrition Pager listed in AMION

## 2020-06-24 NOTE — Progress Notes (Signed)
PROGRESS NOTE  Dwayne Barnes  DOB: 20-Apr-1951  PCP: [name], PA-C HER:[phone]  DOA: 05/20/2020  LOS: 34 days   Chief Complaint  Patient presents with  . Fatigue   Brief narrative: Dwayne Barnes is a 69 y.o. male with no significant PMHx who presented to the ED on 11/17 with nausea, weakness, found to have COVID-19 and a large PE.   Please see summary by Dr. Marland Mcalpine on 12/18, but in brief, patient's illness complicated by cardiac arrest x2, C. difficile colitis and hematochezia.  Then prolonged hospitalization due to immobility and deconditioning.  Significant Events: 11/17>> Admit to [facility] for PE/COVID-19 infection 11/19>> PEA cardiac arrest x 2-given TPA-intubated-transfer to ICU 11/21>> IVC filter placed 11/23>> Self extubated 11/25>> transferred out of ICU 11/29>> new diarrhea-CT abdomen with progressive colitis C diff positive, started on oral vancomycin 12/8>> discharge planned but patient with recurrent fever due to atelectasis and so discharge delayed 12/14>> discharge planned again, but had new red GI Bleeding noted so GI was consulted; after evaluation, felt that the bleeding was from hemorrhoids  Significant studies: 11/18>>CT renal stone: non specific colitis, GGO's, left nephrolithiasis without obstructive uropathy. 11/18>>CTA chest: large PC in LLL, GGO's LLL. 11/20 echo>> EF 50-55%, RV systolic function severely reduced. 11/21>> B/L lower extremity Doppler: acute DVTin left femoral vein, acute mobile thrombus in the proximal femoral vein. 11/23>>CT Head: unremarkable 11/29>> CT abdomen/pelvis>> progressive colitis  Subjective: Patient was seen and examined this morning.  Open eyes in touch.  Very hard of hearing.  Not in distress. On chart review, it was noted that patient had 2 episodes of low-grade temperature of 100 last night.  Assessment/Plan: Pulmonary embolism Deep venous thrombosis s/p IVC filter Status post IVC filter on 11/21  -Currently  on anticoagulation. -Echo showed severely reduced RV function.  Unfortunately, although initially stable, suffered cardiac arrest and was transferred to the ICU and given tPA.    Cardiac arrest 11/19>> PEA cardiac arrest x 2-given TPA-intubated-transfer to ICU 11/21>> IVC filter placed 11/23>> Self extubated  C. difficile colitis- -Only one dose antibiotics Rocephin and Flagyl given on admission.  Paitent later developed leukocytosis, diarrhea.  -11/29, CT abdomen showed progressive colitis.  11/30, C. difficile positive.  Completed 10-day course of oral vancomycin.  Hematochezia due to hemorrhoids -Hematochezia stopped after anticoagulation was held briefly.  Currently back on anticoagulation.  No further bleeding. -Continue anusol, fiber.  Paroxysmal atrial fibrillation Rate controlled -Continue apixaban   Normocytic anemia -Hgb stable at mid 8s. -Repeat CBC in 2 weeks Recent Labs    05/22/20 0239 05/22/20 1815 05/23/20 0418 05/24/20 0416 05/25/20 0500 05/25/20 0506 05/26/20 0500 05/27/20 0500 06/19/20 0327 06/20/20 0222 06/21/20 0028 06/23/20 0140 06/24/20 0848  HGB 12.0*   < > 11.8* 10.3* 9.3*   < > 9.1*   < > 8.3* 8.3* 8.5* 8.4* 8.7*  MCV 89.3   < > 90.4 90.1 90.6  --  92.6   < > 86.9 87.0 86.9 85.8 86.2  FERRITIN 522*  --  1,041* 601* 432*  --  388*  --   --   --   --   --   --    < > = values in this interval not displayed.   Transaminitis -LFT elevated.  Repeat LFTs in 4 weeks. Recent Labs  Lab 06/19/20 0327 06/20/20 0222 06/21/20 0028  AST 56* 56* 63*  ALT 67* 68* 74*  ALKPHOS 223* 236* 229*  BILITOT 0.5 0.6 0.5  PROT 5.5* 5.8* 5.9*  ALBUMIN 1.6* 1.7* 1.7*   Hyponatremia -Stable, no symptoms -Repeat BMP in 2 weeks Recent Labs  Lab 06/18/20 0025 06/19/20 0327 06/20/20 0222 06/21/20 0028 06/24/20 0848  NA 131* 131* 132* 131* 129*   Severe protein calorie malnutrition As evidenced by severe illness, severely reduced muscle mass and  fat, thenar wasting and poor oral intake. Albumin low at 1.7. -Continue feeding supplement  Fever and leukocytosis -Patient continues to have intermittent low-grade temperature close to 100. During this hospitalization, his discharge has been held 3 times -Antibiotics were held to avoid recurrence of C. difficile.  -Patient is still having low-grade temperature Recent Labs  Lab 06/19/20 0327 06/20/20 0222 06/21/20 0028 06/23/20 0140 06/24/20 0848  WBC 10.8* 11.1* 12.7* 18.6* 17.1*  PROCALCITON  --   --   --  0.17  --    Mobility: Encourage ambulation Code Status:   Code Status: Full Code  Nutritional status: Body mass index is 17.52 kg/m. Nutrition Problem: Severe Malnutrition Etiology: acute illness (COVID 19 infection) Signs/Symptoms: severe fat depletion,severe muscle depletion Diet Order            DIET DYS 3 Room service appropriate? No; Fluid consistency: Thin; Fluid restriction: 1200 mL Fluid  Diet effective now                 DVT prophylaxis:  apixaban (ELIQUIS) tablet 5 mg   Antimicrobials: none Fluid: none Consultants: none Family Communication: none at bedside  Status is: Inpatient  Remains inpatient appropriate because - pending SNF  Dispo: The patient is from: Home              Anticipated d/c is to: SNF              Anticipated d/c date is: Medically stable for discharge when bed available               Infusions:    Scheduled Meds: . apixaban  5 mg Oral BID  . famotidine  20 mg Oral Daily  . feeding supplement  237 mL Oral TID BM  . hydrocortisone  25 mg Rectal BID  . multivitamin with minerals  1 tablet Oral Daily  . psyllium  1 packet Oral Daily  . sodium chloride flush  10-40 mL Intracatheter Q12H    Antimicrobials: Anti-infectives (From admission, onward)   Start     Dose/Rate Route Frequency Ordered Stop   06/14/20 0000  vancomycin (VANCOCIN) 50 mg/mL oral solution        125 mg Oral 4 times daily 06/14/20 1011 06/19/20 2359    06/13/20 1200  vancomycin (VANCOCIN) 50 mg/mL oral solution 125 mg        125 mg Oral Every 6 hours 06/13/20 0933 06/23/20 2359   06/10/20 2230  vancomycin (VANCOREADY) IVPB 1250 mg/250 mL        1,250 mg 166.7 mL/hr over 90 Minutes Intravenous  Once 06/10/20 2139 06/11/20 0140   06/10/20 2230  cephALEXin (KEFLEX) capsule 500 mg        500 mg Oral  Once 06/10/20 2139 06/10/20 2155   06/10/20 0000  vancomycin (VANCOCIN) 50 mg/mL oral solution  Status:  Discontinued        125 mg Oral 4 times daily 06/10/20 1053 06/14/20    06/02/20 1000  vancomycin (VANCOCIN) 50 mg/mL oral solution 125 mg        125 mg Oral 4 times daily 06/02/20 0800 06/11/20 2158   05/22/20 1000  remdesivir 100 mg in  sodium chloride 0.9 % 100 mL IVPB       "Followed by" Linked Group Details   100 mg 200 mL/hr over 30 Minutes Intravenous Daily 05/21/20 0857 05/25/20 1154   05/21/20 1030  remdesivir 200 mg in sodium chloride 0.9% 250 mL IVPB  Status:  Discontinued       "Followed by" Linked Group Details   200 mg 580 mL/hr over 30 Minutes Intravenous Once 05/21/20 0857 05/27/20 0734   05/21/20 0330  metroNIDAZOLE (FLAGYL) tablet 500 mg        500 mg Oral  Once 05/21/20 0320 05/21/20 0411   05/20/20 2330  cefTRIAXone (ROCEPHIN) 2 g in sodium chloride 0.9 % 100 mL IVPB        2 g 200 mL/hr over 30 Minutes Intravenous  Once 05/20/20 2324 05/21/20 0331      PRN meds: acetaminophen (TYLENOL) oral liquid 160 mg/5 mL, [DISCONTINUED] ondansetron **OR** ondansetron (ZOFRAN) IV   Objective: Vitals:   06/24/20 0601 06/24/20 1224  BP: 95/61 117/68  Pulse: 95   Resp: 18 18  Temp: 99.5 F (37.5 C) 98.5 F (36.9 C)  SpO2: 95% 93%    Intake/Output Summary (Last 24 hours) at 06/24/2020 1322 Last data filed at 06/24/2020 1109 Gross per 24 hour  Intake 120 ml  Output 1500 ml  Net -1380 ml   Filed Weights   06/16/20 0520 06/17/20 0056 06/19/20 0500  Weight: 59.4 kg 59.1 kg 58.6 kg   Weight change:  Body mass index is  17.52 kg/m.   Physical Exam: General exam: Pleasant, not in distress. Skin: No rashes, lesions or ulcers. HEENT: Atraumatic, normocephalic, no obvious bleeding Lungs: Clear to auscultation bilaterally CVS: Regular rate and rhythm, no murmur GI/Abd soft, nontender, nondistended, bowel CNS: Alert, awake, oriented x3, hard of hearing Psychiatry: Mood appropriate Extremities: No pedal edema, tenderness  Data Review: I have personally reviewed the laboratory data and studies available.  Recent Labs  Lab 06/19/20 0327 06/20/20 0222 06/21/20 0028 06/23/20 0140 06/24/20 0848  WBC 10.8* 11.1* 12.7* 18.6* 17.1*  NEUTROABS 8.0* 8.0* 9.7*  --  14.1*  HGB 8.3* 8.3* 8.5* 8.4* 8.7*  HCT 26.6* 26.1* 27.2* 26.5* 28.0*  MCV 86.9 87.0 86.9 85.8 86.2  PLT 633* 635* 625* 571* 634*   Recent Labs  Lab 06/18/20 0025 06/19/20 0327 06/20/20 0222 06/21/20 0028 06/24/20 0848  NA 131* 131* 132* 131* 129*  K 4.2 4.1 4.2 4.6 4.0  CL 97* 96* 95* 94* 92*  CO2 25 26 26 26 26   GLUCOSE 107* 101* 95 110* 112*  BUN 11 14 12 12 11   CREATININE 0.99 1.05 0.98 0.91 0.87  CALCIUM 8.5* 8.4* 8.6* 8.6* 8.9  MG  --  2.0 1.9 2.0  --   PHOS  --  3.6 4.5 3.6  --     F/u labs ordered.  Signed, , MD [facility] 06/24/2020

## 2020-06-24 NOTE — Plan of Care (Signed)
°  Problem: Education: Goal: Knowledge of risk factors and measures for prevention of condition will improve Outcome: Progressing   Problem: Coping: Goal: Psychosocial and spiritual needs will be supported Outcome: Progressing   Problem: Respiratory: Goal: Will maintain a patent airway Outcome: Progressing Goal: Complications related to the disease process, condition or treatment will be avoided or minimized Outcome: Progressing   Problem: Activity: Goal: Ability to tolerate increased activity will improve Outcome: Progressing   Problem: Clinical Measurements: Goal: Ability to maintain a body temperature in the normal range will improve Outcome: Progressing   Problem: Respiratory: Goal: Ability to maintain adequate ventilation will improve Outcome: Progressing Goal: Ability to maintain a clear airway will improve Outcome: Progressing   Problem: Education: Goal: Knowledge of General Education information will improve Description: Including pain rating scale, medication(s)/side effects and non-pharmacologic comfort measures Outcome: Progressing   Problem: Health Behavior/Discharge Planning: Goal: Ability to manage health-related needs will improve Outcome: Progressing   Problem: Clinical Measurements: Goal: Ability to maintain clinical measurements within normal limits will improve Outcome: Progressing Goal: Will remain free from infection Outcome: Progressing Goal: Diagnostic test results will improve Outcome: Progressing Goal: Respiratory complications will improve Outcome: Progressing Goal: Cardiovascular complication will be avoided Outcome: Progressing   Problem: Activity: Goal: Risk for activity intolerance will decrease Outcome: Progressing   Problem: Nutrition: Goal: Adequate nutrition will be maintained Outcome: Progressing   Problem: Coping: Goal: Level of anxiety will decrease Outcome: Progressing   Problem: Elimination: Goal: Will not experience  complications related to bowel motility Outcome: Progressing Goal: Will not experience complications related to urinary retention Outcome: Progressing   Problem: Pain Managment: Goal: General experience of comfort will improve Outcome: Progressing   Problem: Safety: Goal: Ability to remain free from injury will improve Outcome: Progressing   Problem: Skin Integrity: Goal: Risk for impaired skin integrity will decrease Outcome: Progressing   Problem: Education: Goal: Knowledge of General Education information will improve Description: Including pain rating scale, medication(s)/side effects and non-pharmacologic comfort measures Outcome: Progressing   Problem: Health Behavior/Discharge Planning: Goal: Ability to manage health-related needs will improve Outcome: Progressing   Problem: Clinical Measurements: Goal: Ability to maintain clinical measurements within normal limits will improve Outcome: Progressing Goal: Will remain free from infection Outcome: Progressing Goal: Diagnostic test results will improve Outcome: Progressing Goal: Respiratory complications will improve Outcome: Progressing Goal: Cardiovascular complication will be avoided Outcome: Progressing   Problem: Activity: Goal: Risk for activity intolerance will decrease Outcome: Progressing   Problem: Nutrition: Goal: Adequate nutrition will be maintained Outcome: Progressing   Problem: Coping: Goal: Level of anxiety will decrease Outcome: Progressing   Problem: Elimination: Goal: Will not experience complications related to bowel motility Outcome: Progressing Goal: Will not experience complications related to urinary retention Outcome: Progressing   Problem: Pain Managment: Goal: General experience of comfort will improve Outcome: Progressing   Problem: Safety: Goal: Ability to remain free from injury will improve Outcome: Progressing   Problem: Skin Integrity: Goal: Risk for impaired skin  integrity will decrease Outcome: Progressing   

## 2020-06-24 NOTE — TOC Progression Note (Signed)
Transition of Care [facility]) - Progression Note    Patient Details  Name: Dwayne Barnes MRN: [id number] Date of Birth: Aug 08, 1950  Transition of Care [facility]) CM/SW Contact  Mearl Latin, LCSW Phone Number: 06/24/2020, 9:31 AM  Clinical Narrative:    CSW still awaiting insurance approval from [facility].    Expected Discharge Plan: Skilled Nursing Facility Barriers to Discharge: Barriers Resolved  Expected Discharge Plan and Services Expected Discharge Plan: Skilled Nursing Facility In-house Referral: Clinical Social Work Discharge Planning Services: CM Consult Post Acute Care Choice: Skilled Nursing Facility Living arrangements for the past 2 months: Single Family Home Expected Discharge Date: 06/21/20                                     Social Determinants of Health (SDOH) Interventions    Readmission Risk Interventions No flowsheet data found.

## 2020-06-25 DIAGNOSIS — R918 Other nonspecific abnormal finding of lung field: Secondary | ICD-10-CM | POA: Diagnosis not present

## 2020-06-25 DIAGNOSIS — R262 Difficulty in walking, not elsewhere classified: Secondary | ICD-10-CM | POA: Diagnosis not present

## 2020-06-25 DIAGNOSIS — E870 Hyperosmolality and hypernatremia: Secondary | ICD-10-CM | POA: Diagnosis not present

## 2020-06-25 DIAGNOSIS — J189 Pneumonia, unspecified organism: Secondary | ICD-10-CM | POA: Diagnosis not present

## 2020-06-25 DIAGNOSIS — A0472 Enterocolitis due to Clostridium difficile, not specified as recurrent: Secondary | ICD-10-CM | POA: Diagnosis not present

## 2020-06-25 DIAGNOSIS — R1312 Dysphagia, oropharyngeal phase: Secondary | ICD-10-CM | POA: Diagnosis not present

## 2020-06-25 DIAGNOSIS — I82512 Chronic embolism and thrombosis of left femoral vein: Secondary | ICD-10-CM | POA: Diagnosis not present

## 2020-06-25 DIAGNOSIS — R1310 Dysphagia, unspecified: Secondary | ICD-10-CM | POA: Diagnosis not present

## 2020-06-25 DIAGNOSIS — R531 Weakness: Secondary | ICD-10-CM | POA: Diagnosis not present

## 2020-06-25 DIAGNOSIS — D649 Anemia, unspecified: Secondary | ICD-10-CM | POA: Diagnosis not present

## 2020-06-25 DIAGNOSIS — E871 Hypo-osmolality and hyponatremia: Secondary | ICD-10-CM | POA: Diagnosis not present

## 2020-06-25 DIAGNOSIS — I480 Paroxysmal atrial fibrillation: Secondary | ICD-10-CM | POA: Diagnosis not present

## 2020-06-25 DIAGNOSIS — M6281 Muscle weakness (generalized): Secondary | ICD-10-CM | POA: Diagnosis not present

## 2020-06-25 DIAGNOSIS — U071 COVID-19: Secondary | ICD-10-CM | POA: Diagnosis not present

## 2020-06-25 DIAGNOSIS — N179 Acute kidney failure, unspecified: Secondary | ICD-10-CM | POA: Diagnosis not present

## 2020-06-25 DIAGNOSIS — R279 Unspecified lack of coordination: Secondary | ICD-10-CM | POA: Diagnosis not present

## 2020-06-25 DIAGNOSIS — E46 Unspecified protein-calorie malnutrition: Secondary | ICD-10-CM | POA: Diagnosis not present

## 2020-06-25 DIAGNOSIS — R5381 Other malaise: Secondary | ICD-10-CM | POA: Diagnosis not present

## 2020-06-25 DIAGNOSIS — J1282 Pneumonia due to coronavirus disease 2019: Secondary | ICD-10-CM | POA: Diagnosis not present

## 2020-06-25 DIAGNOSIS — G9341 Metabolic encephalopathy: Secondary | ICD-10-CM | POA: Diagnosis not present

## 2020-06-25 DIAGNOSIS — I2699 Other pulmonary embolism without acute cor pulmonale: Secondary | ICD-10-CM | POA: Diagnosis not present

## 2020-06-25 LAB — CBC WITH DIFFERENTIAL/PLATELET
Abs Immature Granulocytes: 0.18 10*3/uL — ABNORMAL HIGH (ref 0.00–0.07)
Basophils Absolute: 0.1 10*3/uL (ref 0.0–0.1)
Basophils Relative: 0 %
Eosinophils Absolute: 0 10*3/uL (ref 0.0–0.5)
Eosinophils Relative: 0 %
HCT: 27.1 % — ABNORMAL LOW (ref 39.0–52.0)
Hemoglobin: 8.6 g/dL — ABNORMAL LOW (ref 13.0–17.0)
Immature Granulocytes: 1 %
Lymphocytes Relative: 13 %
Lymphs Abs: 2.5 10*3/uL (ref 0.7–4.0)
MCH: 26.8 pg (ref 26.0–34.0)
MCHC: 31.7 g/dL (ref 30.0–36.0)
MCV: 84.4 fL (ref 80.0–100.0)
Monocytes Absolute: 0.9 10*3/uL (ref 0.1–1.0)
Monocytes Relative: 5 %
Neutro Abs: 15.1 10*3/uL — ABNORMAL HIGH (ref 1.7–7.7)
Neutrophils Relative %: 81 %
Platelets: 656 10*3/uL — ABNORMAL HIGH (ref 150–400)
RBC: 3.21 MIL/uL — ABNORMAL LOW (ref 4.22–5.81)
RDW: 14.9 % (ref 11.5–15.5)
WBC: 18.7 10*3/uL — ABNORMAL HIGH (ref 4.0–10.5)
nRBC: 0 % (ref 0.0–0.2)

## 2020-06-25 LAB — BASIC METABOLIC PANEL
Anion gap: 10 (ref 5–15)
BUN: 10 mg/dL (ref 8–23)
CO2: 25 mmol/L (ref 22–32)
Calcium: 8.5 mg/dL — ABNORMAL LOW (ref 8.9–10.3)
Chloride: 92 mmol/L — ABNORMAL LOW (ref 98–111)
Creatinine, Ser: 0.9 mg/dL (ref 0.61–1.24)
GFR, Estimated: >60 mL/min (ref >60)
Glucose, Bld: 109 mg/dL — ABNORMAL HIGH (ref 70–99)
Potassium: 4.3 mmol/L (ref 3.5–5.1)
Sodium: 127 mmol/L — ABNORMAL LOW (ref 135–145)

## 2020-06-25 MED ORDER — APIXABAN 5 MG PO TABS: 5.0000 mg | ORAL_TABLET | Freq: Two times a day (BID) | ORAL | Status: AC

## 2020-06-25 NOTE — Progress Notes (Signed)
PROGRESS NOTE  Dwayne Barnes  DOB: 03/12/1951  PCP: [name], PA-C HER:[phone]  DOA: 05/20/2020  LOS: 35 days   Chief Complaint  Patient presents with  . Fatigue   Brief narrative: Dwayne Barnes is a 69 y.o. male with no significant PMHx who presented to the ED on 11/17 with nausea, weakness, found to have COVID-19 and a large PE.   Patient's illness was complicated by cardiac arrest x2, C. difficile colitis and hematochezia.    Is hospitalized was for the prolonged by intermittent fever, impaired mobility and deconditioning.  Currently waiting for SNF authorization.    Significant Events: 11/17>> Admit to [facility] for PE/COVID-19 infection 11/19>> PEA cardiac arrest x 2-given TPA-intubated-transfer to ICU 11/21>> IVC filter placed 11/23>> Self extubated 11/25>> transferred out of ICU 11/29>> new diarrhea-CT abdomen with progressive colitis C diff positive, started on oral vancomycin 12/8>> discharge was planned but patient with recurrent fever due to atelectasis and so discharge delayed 12/14>> discharge was planned again, but had new red GI Bleeding.  GI evaluation was obtained.  Bleeding was suspected to be from hemorrhoids.  Significant studies: 11/18>>CT renal stone: non specific colitis, GGO's, left nephrolithiasis without obstructive uropathy. 11/18>>CTA chest: large PC in LLL, GGO's LLL. 11/20 echo>> EF 50-55%, RV systolic function severely reduced. 11/21>> B/L lower extremity Doppler: acute DVTin left femoral vein, acute mobile thrombus in the proximal femoral vein. 11/23>>CT Head: unremarkable 11/29>> CT abdomen/pelvis>> progressive colitis  Subjective: Patient was seen and examined this morning.   Very hard of hearing.  Opens eyes on touch.  Not in distress.  No new symptoms.  No fever in last 24 hours.    Assessment/Plan: Acute pulmonary embolism Deep venous thrombosis s/p IVC filter Status post IVC filter on 11/21  -CT angio on admission CT angio of chest  obtained on admission showed large occlusive pulmonary embolism within the proximal left lower lobe pulmonary artery.  Patient was clinically stable at first but he suffered cardiac arrest and was transferred to ICU and required TPA.   -Currently on anticoagulation with Eliquis 5 mg twice daily -11/20, Echo showed severely reduced RV function.  Cardiac arrest 11/19>> PEA cardiac arrest x 2-given TPA the presence of pulmonary embolism-intubated-transfer to ICU 11/21>> IVC filter placed 11/23>> Self extubated  C. difficile colitis -Only one dose antibiotics Rocephin and Flagyl given on admission.  Paitent later developed leukocytosis, diarrhea.  -11/29, CT abdomen showed progressive colitis.  11/30, C. difficile positive.  Completed 10-day course of oral vancomycin.  Diarrhea improved.  Hematochezia due to hemorrhoids -Hematochezia stopped after anticoagulation was held briefly.  Currently back on anticoagulation.  No further bleeding. -Continue anusol, fiber.  Paroxysmal atrial fibrillation -Self rate controlled -Continue apixaban   Normocytic anemia -Hgb stable at mid 8s. -Repeat CBC in 2 weeks.  Iron stores adequate. Recent Labs    05/22/20 0239 05/22/20 1815 05/23/20 0418 05/24/20 0416 05/25/20 0500 05/25/20 0506 05/26/20 0500 05/27/20 0500 06/20/20 0222 06/21/20 0028 06/23/20 0140 06/24/20 0848 06/25/20 0214  HGB 12.0*   < > 11.8* 10.3* 9.3*   < > 9.1*   < > 8.3* 8.5* 8.4* 8.7* 8.6*  MCV 89.3   < > 90.4 90.1 90.6  --  92.6   < > 87.0 86.9 85.8 86.2 84.4  FERRITIN 522*  --  1,041* 601* 432*  --  388*  --   --   --   --   --   --    < > = values in  this interval not displayed.   Transaminitis -LFT elevated.  Repeat LFTs in 4 weeks. Recent Labs  Lab 06/19/20 0327 06/20/20 0222 06/21/20 0028  AST 56* 56* 63*  ALT 67* 68* 74*  ALKPHOS 223* 236* 229*  BILITOT 0.5 0.6 0.5  PROT 5.5* 5.8* 5.9*  ALBUMIN 1.6* 1.7* 1.7*   Hyponatremia -Stable, no symptoms.   Probably related to low oral intake. -Repeat BMP in 2 weeks Recent Labs  Lab 06/19/20 0327 06/20/20 0222 06/21/20 0028 06/24/20 0848 06/25/20 0214  NA 131* 132* 131* 129* 127*   Severe protein calorie malnutrition As evidenced by severe illness, severely reduced muscle mass and fat, thenar wasting and poor oral intake. Albumin low at 1.7. -Continue feeding supplement  Fever and leukocytosis -Patient continues to have intermittent low-grade temperature close to 100. During this hospitalization, his discharge has been held 3 times -Antibiotics were held to avoid recurrence of C. difficile.  -Patient has had no fever in the last 24 hours.  No other symptoms suggestive of infection. Recent Labs  Lab 06/20/20 0222 06/21/20 0028 06/23/20 0140 06/24/20 0848 06/25/20 0214  WBC 11.1* 12.7* 18.6* 17.1* 18.7*  PROCALCITON  --   --  0.17  --   --    Mobility: Encourage ambulation Code Status:   Code Status: Full Code  Nutritional status: Body mass index is 17.52 kg/m. Nutrition Problem: Severe Malnutrition Etiology: acute illness (COVID 19 infection) Signs/Symptoms: severe fat depletion,severe muscle depletion Diet Order            DIET DYS 3 Room service appropriate? No; Fluid consistency: Thin; Fluid restriction: 1200 mL Fluid  Diet effective now                 DVT prophylaxis:  apixaban (ELIQUIS) tablet 5 mg   Antimicrobials: none Fluid: none Consultants: none Family Communication: none at bedside  Status is: Inpatient  Remains inpatient appropriate because - pending SNF  Dispo: The patient is from: Home              Anticipated d/c is to: SNF              Anticipated d/c date is: Medically stable for discharge when bed available               Infusions:    Scheduled Meds: . apixaban  5 mg Oral BID  . famotidine  20 mg Oral Daily  . feeding supplement  237 mL Oral TID BM  . hydrocortisone  25 mg Rectal BID  . multivitamin with minerals  1 tablet Oral  Daily  . psyllium  1 packet Oral Daily  . sodium chloride flush  10-40 mL Intracatheter Q12H    Antimicrobials: Anti-infectives (From admission, onward)   Start     Dose/Rate Route Frequency Ordered Stop   06/14/20 0000  vancomycin (VANCOCIN) 50 mg/mL oral solution        125 mg Oral 4 times daily 06/14/20 1011 06/19/20 2359   06/13/20 1200  vancomycin (VANCOCIN) 50 mg/mL oral solution 125 mg        125 mg Oral Every 6 hours 06/13/20 0933 06/23/20 2359   06/10/20 2230  vancomycin (VANCOREADY) IVPB 1250 mg/250 mL        1,250 mg 166.7 mL/hr over 90 Minutes Intravenous  Once 06/10/20 2139 06/11/20 0140   06/10/20 2230  cephALEXin (KEFLEX) capsule 500 mg        500 mg Oral  Once 06/10/20 2139 06/10/20 2155  06/10/20 0000  vancomycin (VANCOCIN) 50 mg/mL oral solution  Status:  Discontinued        125 mg Oral 4 times daily 06/10/20 1053 06/14/20    06/02/20 1000  vancomycin (VANCOCIN) 50 mg/mL oral solution 125 mg        125 mg Oral 4 times daily 06/02/20 0800 06/11/20 2158   05/22/20 1000  remdesivir 100 mg in sodium chloride 0.9 % 100 mL IVPB       "Followed by" Linked Group Details   100 mg 200 mL/hr over 30 Minutes Intravenous Daily 05/21/20 0857 05/25/20 1154   05/21/20 1030  remdesivir 200 mg in sodium chloride 0.9% 250 mL IVPB  Status:  Discontinued       "Followed by" Linked Group Details   200 mg 580 mL/hr over 30 Minutes Intravenous Once 05/21/20 0857 05/27/20 0734   05/21/20 0330  metroNIDAZOLE (FLAGYL) tablet 500 mg        500 mg Oral  Once 05/21/20 0320 05/21/20 0411   05/20/20 2330  cefTRIAXone (ROCEPHIN) 2 g in sodium chloride 0.9 % 100 mL IVPB        2 g 200 mL/hr over 30 Minutes Intravenous  Once 05/20/20 2324 05/21/20 0331      PRN meds: acetaminophen (TYLENOL) oral liquid 160 mg/5 mL, [DISCONTINUED] ondansetron **OR** ondansetron (ZOFRAN) IV   Objective: Vitals:   06/24/20 2142 06/25/20 0640  BP: 125/67 101/64  Pulse: (!) 105 94  Resp: 20 19  Temp: 99.1 F  (37.3 C) 98.4 F (36.9 C)  SpO2: 95% 96%    Intake/Output Summary (Last 24 hours) at 06/25/2020 0939 Last data filed at 06/25/2020 2025 Gross per 24 hour  Intake --  Output 800 ml  Net -800 ml   Filed Weights   06/16/20 0520 06/17/20 0056 06/19/20 0500  Weight: 59.4 kg 59.1 kg 58.6 kg   Weight change:  Body mass index is 17.52 kg/m.   Physical Exam: General exam: Pleasant, not in distress. Skin: No rashes, lesions or ulcers. HEENT: Atraumatic, normocephalic, no obvious bleeding Lungs: Clear to auscultation bilaterally CVS: Regular rate and rhythm, no murmur GI/Abd soft, nontender, nondistended, bowel sound present. CNS: Alert, awake, oriented x3, hard of hearing Psychiatry: Mood appropriate Extremities: No pedal edema, tenderness  Data Review: I have personally reviewed the laboratory data and studies available.  Recent Labs  Lab 06/19/20 0327 06/20/20 0222 06/21/20 0028 06/23/20 0140 06/24/20 0848 06/25/20 0214  WBC 10.8* 11.1* 12.7* 18.6* 17.1* 18.7*  NEUTROABS 8.0* 8.0* 9.7*  --  14.1* 15.1*  HGB 8.3* 8.3* 8.5* 8.4* 8.7* 8.6*  HCT 26.6* 26.1* 27.2* 26.5* 28.0* 27.1*  MCV 86.9 87.0 86.9 85.8 86.2 84.4  PLT 633* 635* 625* 571* 634* 656*   Recent Labs  Lab 06/19/20 0327 06/20/20 0222 06/21/20 0028 06/24/20 0848 06/25/20 0214  NA 131* 132* 131* 129* 127*  K 4.1 4.2 4.6 4.0 4.3  CL 96* 95* 94* 92* 92*  CO2 26 26 26 26 25   GLUCOSE 101* 95 110* 112* 109*  BUN 14 12 12 11 10   CREATININE 1.05 0.98 0.91 0.87 0.90  CALCIUM 8.4* 8.6* 8.6* 8.9 8.5*  MG 2.0 1.9 2.0  --   --   PHOS 3.6 4.5 3.6  --   --     F/u labs ordered.  Signed, , MD [facility] 06/25/2020

## 2020-06-25 NOTE — Plan of Care (Signed)
  Problem: Education: Goal: Knowledge of risk factors and measures for prevention of condition will improve Outcome: Progressing   Problem: Coping: Goal: Psychosocial and spiritual needs will be supported Outcome: Progressing   Problem: Respiratory: Goal: Will maintain a patent airway Outcome: Progressing Goal: Complications related to the disease process, condition or treatment will be avoided or minimized Outcome: Progressing   Problem: Activity: Goal: Ability to tolerate increased activity will improve Outcome: Progressing   Problem: Clinical Measurements: Goal: Ability to maintain a body temperature in the normal range will improve Outcome: Progressing   Problem: Respiratory: Goal: Ability to maintain adequate ventilation will improve Outcome: Progressing Goal: Ability to maintain a clear airway will improve Outcome: Progressing   Problem: Education: Goal: Knowledge of General Education information will improve Description: Including pain rating scale, medication(s)/side effects and non-pharmacologic comfort measures Outcome: Progressing   Problem: Health Behavior/Discharge Planning: Goal: Ability to manage health-related needs will improve Outcome: Progressing   Problem: Clinical Measurements: Goal: Ability to maintain clinical measurements within normal limits will improve Outcome: Progressing Goal: Will remain free from infection Outcome: Progressing Goal: Diagnostic test results will improve Outcome: Progressing Goal: Respiratory complications will improve Outcome: Progressing Goal: Cardiovascular complication will be avoided Outcome: Progressing   Problem: Activity: Goal: Risk for activity intolerance will decrease Outcome: Progressing   Problem: Nutrition: Goal: Adequate nutrition will be maintained Outcome: Progressing   Problem: Coping: Goal: Level of anxiety will decrease Outcome: Progressing   Problem: Elimination: Goal: Will not experience  complications related to bowel motility Outcome: Progressing Goal: Will not experience complications related to urinary retention Outcome: Progressing   Problem: Pain Managment: Goal: General experience of comfort will improve Outcome: Progressing   Problem: Safety: Goal: Ability to remain free from injury will improve Outcome: Progressing   Problem: Skin Integrity: Goal: Risk for impaired skin integrity will decrease Outcome: Progressing   Problem: Education: Goal: Knowledge of General Education information will improve Description: Including pain rating scale, medication(s)/side effects and non-pharmacologic comfort measures Outcome: Progressing   Problem: Health Behavior/Discharge Planning: Goal: Ability to manage health-related needs will improve Outcome: Progressing   Problem: Clinical Measurements: Goal: Ability to maintain clinical measurements within normal limits will improve Outcome: Progressing Goal: Will remain free from infection Outcome: Progressing Goal: Diagnostic test results will improve Outcome: Progressing Goal: Respiratory complications will improve Outcome: Progressing Goal: Cardiovascular complication will be avoided Outcome: Progressing   Problem: Activity: Goal: Risk for activity intolerance will decrease Outcome: Progressing   Problem: Nutrition: Goal: Adequate nutrition will be maintained Outcome: Progressing   Problem: Coping: Goal: Level of anxiety will decrease Outcome: Progressing   Problem: Elimination: Goal: Will not experience complications related to bowel motility Outcome: Progressing Goal: Will not experience complications related to urinary retention Outcome: Progressing   Problem: Pain Managment: Goal: General experience of comfort will improve Outcome: Progressing   Problem: Safety: Goal: Ability to remain free from injury will improve Outcome: Progressing   Problem: Skin Integrity: Goal: Risk for impaired skin  integrity will decrease Outcome: Progressing

## 2020-06-25 NOTE — Discharge Summary (Signed)
Physician Discharge Summary  Dwayne Barnes WIO:[id number] DOB: 13-Sep-1950 DOA: 05/20/2020  PCP: [name], PA-C  Admit date: 05/20/2020 Discharge date: 06/25/2020  Admitted From: Home Discharge disposition: SNF   Code Status: Full Code  Diet Recommendation: Regular diet  Discharge Diagnosis:   Principal Problem:   COVID-19 virus infection Active Problems:   Generalized weakness   Acute pulmonary embolism (HCC)   Hematochezia   Protein-calorie malnutrition, severe  Brief narrative: Dwayne Barnes is a 69 y.o. male with no significant PMHx who presented to the ED on 11/17 with nausea, weakness, found to have COVID-19 and a large PE.   Patient's illness was complicated by cardiac arrest x2, C. difficile colitis and hematochezia.    Is hospitalized was for the prolonged by intermittent fever, impaired mobility and deconditioning.  Currently waiting for SNF authorization.    Significant Events: 11/17>> Admitted to [facility] for PE/COVID-19 infection 11/19>> PEA cardiac arrest x 2-given TPA-intubated-transfer to ICU 11/21>> IVC filter placed 11/23>> Self extubated 11/25>> transferred out of ICU 11/29>> new diarrhea-CT abdomen with progressive colitis C diff positive, started on oral vancomycin 12/8>> discharge was planned but patient with recurrent fever due to atelectasis and so discharge delayed 12/14>> discharge was planned again, but had new red GI Bleeding.  GI evaluation was obtained.  Bleeding was suspected to be from hemorrhoids.  Significant studies: 11/18>>CT renal stone: non specific colitis, GGO's, left nephrolithiasis without obstructive uropathy. 11/18>>CTA chest: large PC in LLL, GGO's LLL. 11/20 echo>> EF 50-55%, RV systolic function severely reduced. 11/21>> B/L lower extremity Doppler: acute DVTin left femoral vein, acute mobile thrombus in the proximal femoral vein. 11/23>>CT Head: unremarkable 11/29>> CT abdomen/pelvis>> progressive  colitis  Subjective: Patient was seen and examined this morning.   Very hard of hearing.  Opens eyes on touch.  Not in distress.  No new symptoms.  No fever in last 24 hours.    Assessment/Plan: Acute pulmonary embolism Deep venous thrombosis s/p IVC filter Status post IVC filter on 11/21  -CT angio on admission CT angio of chest obtained on admission showed large occlusive pulmonary embolism within the proximal left lower lobe pulmonary artery.  Patient was clinically stable at first but he suffered cardiac arrest and was transferred to ICU and required TPA.   -Currently on anticoagulation with Eliquis 5 mg twice daily -11/20, Echo showed severely reduced RV function.  Cardiac arrest 11/19>> PEA cardiac arrest x 2-given TPA the presence of pulmonary embolism-intubated-transfer to ICU 11/21>> IVC filter placed 11/23>> Self extubated  C. difficile colitis -Only one dose antibiotics Rocephin and Flagyl given on admission.  Paitent later developed leukocytosis, diarrhea.  -11/29, CT abdomen showed progressive colitis.  11/30, C. difficile positive.  Completed 10-day course of oral vancomycin.  Diarrhea improved.  Hematochezia due to hemorrhoids -Hematochezia stopped after anticoagulation was held briefly.  Currently back on anticoagulation.  No further bleeding. -Continue anusol, fiber.  Paroxysmal atrial fibrillation -Self rate controlled -Continue apixaban   Normocytic anemia -Hgb stable at mid 8s. -Repeat CBC in 2 weeks.  Iron stores adequate. Recent Labs    05/22/20 0239 05/22/20 1815 05/23/20 0418 05/24/20 0416 05/25/20 0500 05/25/20 0506 05/26/20 0500 05/27/20 0500 06/20/20 0222 06/21/20 0028 06/23/20 0140 06/24/20 0848 06/25/20 0214  HGB 12.0*   < > 11.8* 10.3* 9.3*   < > 9.1*   < > 8.3* 8.5* 8.4* 8.7* 8.6*  MCV 89.3   < > 90.4 90.1 90.6  --  92.6   < > 87.0 86.9  85.8 86.2 84.4  FERRITIN 522*  --  1,041* 601* 432*  --  388*  --   --   --   --   --   --     < > = values in this interval not displayed.   Transaminitis -LFT elevated.  Repeat LFTs in 4 weeks. Recent Labs  Lab 06/19/20 0327 06/20/20 0222 06/21/20 0028  AST 56* 56* 63*  ALT 67* 68* 74*  ALKPHOS 223* 236* 229*  BILITOT 0.5 0.6 0.5  PROT 5.5* 5.8* 5.9*  ALBUMIN 1.6* 1.7* 1.7*   Hyponatremia -Stable, no symptoms.  Probably related to low oral intake. -Repeat BMP in 2 weeks Recent Labs  Lab 06/19/20 0327 06/20/20 0222 06/21/20 0028 06/24/20 0848 06/25/20 0214  NA 131* 132* 131* 129* 127*   Severe protein calorie malnutrition As evidenced by severe illness, severely reduced muscle mass and fat, thenar wasting and poor oral intake. Albumin low at 1.7. -Continue feeding supplement  Fever and leukocytosis -Patient continues to have intermittent low-grade temperature close to 100. During this hospitalization, his discharge has been held 3 times -Antibiotics were held to avoid recurrence of C. difficile.  -Patient has had no fever in the last 24 hours.  No other symptoms suggestive of infection. Recent Labs  Lab 06/20/20 0222 06/21/20 0028 06/23/20 0140 06/24/20 0848 06/25/20 0214  WBC 11.1* 12.7* 18.6* 17.1* 18.7*  PROCALCITON  --   --  0.17  --   --     Wound care: Wound / Incision (Open or Dehisced) 06/11/20 Skin tear Buttocks Left (Active)  Date First Assessed/Time First Assessed: 06/11/20 2130   Wound Type: Skin tear  Location: Buttocks  Location Orientation: Left    Assessments 06/11/2020  9:30 PM 06/24/2020 10:00 AM  Dressing Type Silicone dressing --  Dressing Changed New --  Dressing Status Clean;Dry;Intact --  Dressing Change Frequency Every 3 days --  Site / Wound Assessment Clean;Pink --  Peri-wound Assessment Intact;Erythema (blanchable) --  Wound Length (cm) -- 0 cm  Closure None --  Drainage Amount None --     No Linked orders to display    Discharge Exam:   Vitals:   06/24/20 0601 06/24/20 1224 06/24/20 2142 06/25/20 0640  BP:  95/61 117/68 125/67 101/64  Pulse: 95  (!) 105 94  Resp: Temp: 99.5 F (37.5 C) 98.5 F (36.9 C) 99.1 F (37.3 C) 98.4 F (36.9 C)  TempSrc: Oral Oral Oral Oral  SpO2: 95% 93% 95% 96%  Weight:      Height:        Body mass index is 17.52 kg/m.  General exam: Pleasant elderly Caucasian male.  Not in distress Skin: No rashes, lesions or ulcers. HEENT: Atraumatic, normocephalic, no obvious bleeding Lungs: Clear to auscultation bilaterally CVS: Regular rate and rhythm, no murmur GI/Abd soft, nontender, nondistended, bowel sound present CNS: Alert, awake, oriented x3.  Hard of hearing at baseline Psychiatry: Mood appropriate Extremities: No pedal edema, no calf tenderness  Follow ups:   Discharge Instructions    Diet general   Complete by: As directed    Discharge instructions   Complete by: As directed    Follow with Primary MD [name], PA-C in 7 days   Get CBC, CMP, 2 view Chest X ray -  checked next visit within 1 week by Primary MD or SNF MD    Activity: As tolerated with Full fall precautions use walker/cane & assistance as needed  Disposition  SNF  Diet: Dysphagia 3 diet with feeding assistance and aspiration precautions.   Special Instructions: If you have smoked or chewed Tobacco  in the last 2 yrs please stop smoking, stop any regular Alcohol  and or any Recreational drug use.  On your next visit with your primary care physician please Get Medicines reviewed and adjusted.  Please request your Prim.MD to go over all Hospital Tests and Procedure/Radiological results at the follow up, please get all Hospital records sent to your Prim MD by signing hospital release before you go home.  If you experience worsening of your admission symptoms, develop shortness of breath, life threatening emergency, suicidal or homicidal thoughts you must seek medical attention immediately by calling 911 or calling your MD immediately  if symptoms less severe.  You Must  read complete instructions/literature along with all the possible adverse reactions/side effects for all the Medicines you take and that have been prescribed to you. Take any new Medicines after you have completely understood and accpet all the possible adverse reactions/side effects.   Discharge instructions   Complete by: As directed    Follow with Primary MD [name], PA-C in 7 days   Get CBC, CMP, 2 view Chest X ray -  checked next visit within 1 week by Primary MD or SNF MD    Activity: As tolerated with Full fall precautions use walker/cane & assistance as needed  Disposition SNF  Diet: Dysphagia 3 diet with feeding assistance and aspiration precautions.   Special Instructions: If you have smoked or chewed Tobacco  in the last 2 yrs please stop smoking, stop any regular Alcohol  and or any Recreational drug use.  On your next visit with your primary care physician please Get Medicines reviewed and adjusted.  Please request your Prim.MD to go over all Hospital Tests and Procedure/Radiological results at the follow up, please get all Hospital records sent to your Prim MD by signing hospital release before you go home.  If you experience worsening of your admission symptoms, develop shortness of breath, life threatening emergency, suicidal or homicidal thoughts you must seek medical attention immediately by calling 911 or calling your MD immediately  if symptoms less severe.  You Must read complete instructions/literature along with all the possible adverse reactions/side effects for all the Medicines you take and that have been prescribed to you. Take any new Medicines after you have completely understood and accpet all the possible adverse reactions/side effects.   Increase activity slowly   Complete by: As directed    Increase activity slowly   Complete by: As directed    Increase activity slowly   Complete by: As directed    No dressing needed   Complete by: As directed    No  dressing needed   Complete by: As directed    No wound care   Complete by: As directed       Contact information for follow-up providers    [name], PA-C. Call.   Specialty: Physician Assistant Why: 1-2 weeks Contact information: [address] [place] [address] [phone]            Contact information for after-discharge care    Destination    [facility] Preferred SNF .   Service: Skilled Nursing Contact information: [address] [place] [address] [phone]                  Recommendations for Outpatient Follow-Up:   1. Follow-up  with PCP as an outpatient  Discharge Instructions:  Follow with Primary MD [name], PA-C in 7 days   Get CBC/CMP checked in next visit within 1 week by PCP or SNF MD ( we routinely change or add medications that can affect your baseline labs and fluid status, therefore we recommend that you get the mentioned basic workup next visit with your PCP, your PCP may decide not to get them or add new tests based on their clinical decision)  On your next visit with your PCP, please Get Medicines reviewed and adjusted.  Please request your PCP  to go over all Hospital Tests and Procedure/Radiological results at the follow up, please get all Hospital records sent to your Prim MD by signing hospital release before you go home.  Activity: As tolerated with Full fall precautions use walker/cane & assistance as needed  For Heart failure patients - Check your Weight same time everyday, if you gain over 2 pounds, or you develop in leg swelling, experience more shortness of breath or chest pain, call your Primary MD immediately. Follow Cardiac Low Salt Diet and 1.5 lit/day fluid restriction.  If you have smoked or chewed Tobacco in the last 2 yrs please stop smoking, stop any regular Alcohol  and or any Recreational drug use.  If you experience worsening of your admission symptoms, develop shortness of  breath, life threatening emergency, suicidal or homicidal thoughts you must seek medical attention immediately by calling 911 or calling your MD immediately  if symptoms less severe.  You Must read complete instructions/literature along with all the possible adverse reactions/side effects for all the Medicines you take and that have been prescribed to you. Take any new Medicines after you have completely understood and accpet all the possible adverse reactions/side effects.   Do not drive, operate heavy machinery, perform activities at heights, swimming or participation in water activities or provide baby sitting services if your were admitted for syncope or siezures until you have seen by Primary MD or a Neurologist and advised to do so again.  Do not drive when taking Pain medications.  Do not take more than prescribed Pain, Sleep and Anxiety Medications  Wear Seat belts while driving.   Please note You were cared for by a hospitalist during your hospital stay. If you have any questions about your discharge medications or the care you received while you were in the hospital after you are discharged, you can call the unit and asked to speak with the hospitalist on call if the hospitalist that took care of you is not available. Once you are discharged, your primary care physician will handle any further medical issues. Please note that NO REFILLS for any discharge medications will be authorized once you are discharged, as it is imperative that you return to your primary care physician (or establish a relationship with a primary care physician if you do not have one) for your aftercare needs so that they can reassess your need for medications and monitor your lab values.    Allergies as of 06/25/2020   No Known Allergies     Medication List    STOP taking these medications   ibuprofen 200 MG tablet Commonly known as: ADVIL     TAKE these medications   Alive Mens Energy Tabs Take 1 tablet  by mouth daily.   apixaban 5 MG Tabs tablet Commonly known as: ELIQUIS Take 1 tablet (5 mg total) by mouth 2 (two) times daily.   famotidine 20  MG tablet Commonly known as: PEPCID Take 1 tablet (20 mg total) by mouth daily.   guaiFENesin 600 MG 12 hr tablet Commonly known as: MUCINEX Take 600 mg by mouth 2 (two) times daily as needed for cough or to loosen phlegm.   hydrocortisone cream 1 % Apply 1 application topically as needed (for bite).   Neosporin Original [phone] Oint Apply 1 application topically as needed (for bite).   OMEGA 3 PO Take 1 capsule by mouth daily.   OVER THE COUNTER MEDICATION Take 1 tablet by mouth daily. "ANDROZONE" TESTOSTERONE SUPPLEMENT            Discharge Care Instructions  (From admission, onward)         Start     Ordered   06/25/20 0000  No dressing needed        06/25/20 1112   06/14/20 0000  No dressing needed        06/14/20 1011          Time coordinating discharge: 35 minutes  The results of significant diagnostics from this hospitalization (including imaging, microbiology, ancillary and laboratory) are listed below for reference.    Procedures and Diagnostic Studies:   CT ANGIO CHEST PE W OR WO CONTRAST  Addendum Date: 05/21/2020   ADDENDUM REPORT: 05/21/2020 19:28 ADDENDUM: Critical Value/emergent results were called by telephone at the time of interpretation on 05/21/2020 at 7:27 pm to provider Dr. Dierdre Highman, who verbally acknowledged these results. Of note the airspace disease in the LEFT lower lobe is progressed from CT same day. Differential remains the same. Electronically Signed   By: Genevive Bi M.D.   On: 05/21/2020 19:28   Result Date: 05/21/2020 CLINICAL DATA:  PE suspected, high probability.  COVID-19 infection EXAM: CT ANGIOGRAPHY CHEST WITH CONTRAST TECHNIQUE: Multidetector CT imaging of the chest was performed using the standard protocol during bolus administration of intravenous contrast. Multiplanar  CT image reconstructions and MIPs were obtained to evaluate the vascular anatomy. CONTRAST:  24mL OMNIPAQUE IOHEXOL 300 MG/ML  SOLN COMPARISON:  None. FINDINGS: Cardiovascular: Large filling defect within the proximal LEFT lower lobe pulmonary artery. Filling defect spans the bifurcation of the LEFT lower lobe and lingular branch pulmonary branches. This this filling defect/thrombus is occlusive to the LEFT lower lobe. No LEFT upper lobe filling defects identified. No filling defect identified within the RIGHT lung pulmonary arteries. No evidence of RIGHT ventricular strain with the RV to LV ratio less than 1. Coronary artery calcification and aortic atherosclerotic calcification. Mediastinum/Nodes: No axillary or supraclavicular adenopathy. No mediastinal or hilar adenopathy. No pericardial fluid. Esophagus normal. Lungs/Pleura: There is diffuse very fine airspace disease within the LEFT lower lobe involving the majority of the LEFT lower lobe. Similar milder findings in the posterior RIGHT lower lobe and lingula. Upper Abdomen: Limited view of the liver, kidneys, pancreas are unremarkable. Normal adrenal glands. Musculoskeletal: No aggressive osseous lesion. Review of the MIP images confirms the above findings. IMPRESSION: 1. Large occlusive pulmonary embolism within the proximal LEFT lower lobe pulmonary artery and lingular pulmonary artery. 2. Diffuse fine airspace disease within the LEFT lower lobe with differential including pulmonary infarction, pulmonary hemorrhage, or pneumonia (including COVID pneumonia). 3. No evidence of RIGHT ventricular strain. 4. Coronary artery calcification and aortic atherosclerotic calcification. Critical Value/emergent results were called by telephone at the time of interpretation on 05/21/2020 at 6:55 pm to provider [facility] , who verbally acknowledged these results. Electronically Signed: By: Genevive Bi M.D. On: 05/21/2020 18:59  DG Chest Port 1 View  Result  Date: 05/21/2020 CLINICAL DATA:  Cough, lower back pain, has not eaten 5 days, emesis EXAM: PORTABLE CHEST 1 VIEW COMPARISON:  Radiograph 04/19/2010, CT 04/18/2010 FINDINGS: Heterogeneous opacities present predominantly along the periphery of the left lung and minimally in the right lung base as well. No pneumothorax or effusion. The aorta is calcified. The remaining cardiomediastinal contours are unremarkable. No acute osseous or soft tissue abnormality. Degenerative changes are present in the imaged spine and shoulders. Telemetry leads overlie the chest. IMPRESSION: Heterogeneous opacities throughout the lungs, greatest in the left lung periphery concerning for pneumonia including potential viral etiology. Aortic Atherosclerosis (ICD10-I70.0). Electronically Signed   By: Kreg Shropshire M.D.   On: 05/21/2020 03:50   CT RENAL STONE STUDY  Result Date: 05/21/2020 CLINICAL DATA:  69 year old male with low back pain, flank pain, decreased p.o. EXAM: CT ABDOMEN AND PELVIS WITHOUT CONTRAST TECHNIQUE: Multidetector CT imaging of the abdomen and pelvis was performed following the standard protocol without IV contrast. COMPARISON:  Noncontrast CT Abdomen and Pelvis 10/28/2008. Portable chest radiograph 04/19/2010. FINDINGS: Lower chest: Patchy, irregular peribronchial and peripheral scattered pulmonary opacity at both lung bases. This seems to be new from last month. Underlying probable pulmonary hyperinflation. Some associated lung base bronchiectasis. No cavitating areas identified. No cardiomegaly, pericardial effusion or pleural effusion. Hepatobiliary: Negative noncontrast liver and gallbladder. Pancreas: Negative. Spleen: Negative. Adrenals/Urinary Tract: Bulky left nephrolithiasis measuring 13 mm at the renal pelvis. Additional left lower pole stone. No hydronephrosis. Negative noncontrast right kidney. No hydroureter. Unremarkable urinary bladder. Incidental pelvic phleboliths. Stomach/Bowel: Decompressed large  bowel. There is long segment circumferential wall thickening in the right colon, up to the 12 mm series 4, image 49) with some areas of adjacent mesenteric stranding (coronal image 43). The wall thickening gradually decreases through the transverse colon. No free air. No free fluid. The terminal ileum appear spared. Appendix seems to remain normal on coronal image 32. No dilated small bowel.  Decompressed stomach and duodenum. Vascular/Lymphatic: Normal caliber abdominal aorta. Mild calcified atherosclerosis. Vascular patency is not evaluated in the absence of IV contrast. No lymphadenopathy identified in the absence of contrast. Reproductive: Negative. Other: Previous lower abdominal ventral hernia repair with mesh. No pelvic free fluid. Musculoskeletal: Degenerative changes in the spine. No acute osseous abnormality identified. IMPRESSION: 1. In the abdomen there is circumferential bowel wall thickening and mesenteric stranding involving the right colon. Wall thickening gradually abates through the transverse colon. This is compatible with Acute Nonspecific Colitis. 2. But the lung bases are also abnormal with patchy and irregular peribronchial and peripheral pulmonary opacity suspicious for acute viral/atypical respiratory infection. No areas of cavitation to suggest septic emboli. No pleural effusion. 3. Bulky left nephrolithiasis, but no obstructive uropathy. 4. Previous lower abdominal hernia repair with mesh. Electronically Signed   By: Odessa Fleming M.D.   On: 05/21/2020 02:46     Labs:   Basic Metabolic Panel: Recent Labs  Lab 06/19/20 0327 06/20/20 0222 06/21/20 0028 06/24/20 0848 06/25/20 0214  NA 131* 132* 131* 129* 127*  K 4.1 4.2 4.6 4.0 4.3  CL 96* 95* 94* 92* 92*  CO2 26 26 26 26 25   GLUCOSE 101* 95 110* 112* 109*  BUN 14 12 12 11 10   CREATININE 1.05 0.98 0.91 0.87 0.90  CALCIUM 8.4* 8.6* 8.6* 8.9 8.5*  MG 2.0 1.9 2.0  --   --   PHOS 3.6 4.5 3.6  --   --    GFR Estimated  Creatinine Clearance: 65.1 mL/min (by C-G formula based on SCr of 0.9 mg/dL). Liver Function Tests: Recent Labs  Lab 06/19/20 0327 06/20/20 0222 06/21/20 0028  AST 56* 56* 63*  ALT 67* 68* 74*  ALKPHOS 223* 236* 229*  BILITOT 0.5 0.6 0.5  PROT 5.5* 5.8* 5.9*  ALBUMIN 1.6* 1.7* 1.7*   No results for input(s): LIPASE, AMYLASE in the last 168 hours. No results for input(s): AMMONIA in the last 168 hours. Coagulation profile No results for input(s): INR, PROTIME in the last 168 hours.  CBC: Recent Labs  Lab 06/19/20 0327 06/20/20 0222 06/21/20 0028 06/23/20 0140 06/24/20 0848 06/25/20 0214  WBC 10.8* 11.1* 12.7* 18.6* 17.1* 18.7*  NEUTROABS 8.0* 8.0* 9.7*  --  14.1* 15.1*  HGB 8.3* 8.3* 8.5* 8.4* 8.7* 8.6*  HCT 26.6* 26.1* 27.2* 26.5* 28.0* 27.1*  MCV 86.9 87.0 86.9 85.8 86.2 84.4  PLT 633* 635* 625* 571* 634* 656*   Cardiac Enzymes: No results for input(s): CKTOTAL, CKMB, CKMBINDEX, TROPONINI in the last 168 hours. BNP: Invalid input(s): POCBNP CBG: No results for input(s): GLUCAP in the last 168 hours. D-Dimer No results for input(s): DDIMER in the last 72 hours. Hgb A1c No results for input(s): HGBA1C in the last 72 hours. Lipid Profile No results for input(s): CHOL, HDL, LDLCALC, TRIG, CHOLHDL, LDLDIRECT in the last 72 hours. Thyroid function studies No results for input(s): TSH, T4TOTAL, T3FREE, THYROIDAB in the last 72 hours.  Invalid input(s): FREET3 Anemia work up No results for input(s): VITAMINB12, FOLATE, FERRITIN, TIBC, IRON, RETICCTPCT in the last 72 hours. Microbiology Recent Results (from the past 240 hour(s))  Resp Panel by RT-PCR (Flu A&B, Covid) Nasopharyngeal Swab     Status: None   Collection Time: 06/19/20  8:17 AM   Specimen: Nasopharyngeal Swab; Nasopharyngeal(NP) swabs in vial transport medium  Result Value Ref Range Status   SARS Coronavirus 2 by RT PCR NEGATIVE NEGATIVE Final    Comment: (NOTE) SARS-CoV-2 target nucleic acids are NOT  DETECTED.  The SARS-CoV-2 RNA is generally detectable in upper respiratory specimens during the acute phase of infection. The lowest concentration of SARS-CoV-2 viral copies this assay can detect is 138 copies/mL. A negative result does not preclude SARS-Cov-2 infection and should not be used as the sole basis for treatment or other patient management decisions. A negative result may occur with  improper specimen collection/handling, submission of specimen other than nasopharyngeal swab, presence of viral mutation(s) within the areas targeted by this assay, and inadequate number of viral copies(<138 copies/mL). A negative result must be combined with clinical observations, patient history, and epidemiological information. The expected result is Negative.  Fact Sheet for Patients:  BloggerCourse.comhttps://www.fda.gov/media/152166/download  Fact Sheet for Healthcare Providers:  SeriousBroker.ithttps://www.fda.gov/media/152162/download  This test is no t yet approved or cleared by the [place] FDA and  has been authorized for detection and/or diagnosis of SARS-CoV-2 by FDA under an Emergency Use Authorization (EUA). This EUA will remain  in effect (meaning this test can be used) for the duration of the COVID-19 declaration under Section 564(b)(1) of the Act, 21 U.S.C.section 360bbb-3(b)(1), unless the authorization is terminated  or revoked sooner.       Influenza A by PCR NEGATIVE NEGATIVE Final   Influenza B by PCR NEGATIVE NEGATIVE Final    Comment: (NOTE) The Xpert Xpress SARS-CoV-2/FLU/RSV plus assay is intended as an aid in the diagnosis of influenza from Nasopharyngeal swab specimens and should not be used as a sole basis for treatment. Nasal washings and  aspirates are unacceptable for Xpert Xpress SARS-CoV-2/FLU/RSV testing.  Fact Sheet for Patients: BloggerCourse.com  Fact Sheet for Healthcare Providers: SeriousBroker.it  This test is not yet  approved or cleared by the [place] FDA and has been authorized for detection and/or diagnosis of SARS-CoV-2 by FDA under an Emergency Use Authorization (EUA). This EUA will remain in effect (meaning this test can be used) for the duration of the COVID-19 declaration under Section 564(b)(1) of the Act, 21 U.S.C. section 360bbb-3(b)(1), unless the authorization is terminated or revoked.  Performed at [facility], [address], [place], [place] [address]      Signed: Lorin Glass  [facility] 06/25/2020, 11:12 AM

## 2020-06-25 NOTE — TOC Transition Note (Addendum)
Transition of Care [facility]) - CM/SW Discharge Note   Patient Details  Name: Dwayne Barnes MRN: [id number] Date of Birth: Dec 19, 1950  Transition of Care [facility]) CM/SW Contact:  Mearl Latin, LCSW Phone Number: 06/25/2020, 12:07 PM   Clinical Narrative:    Patient will DC to: [facility] Anticipated DC date: 06/25/20 Family notified: Daughter, Naval architect by: [facility] 2pm   Per MD patient ready for DC to [facility]. RN to call report prior to discharge [phone], room 106). RN, patient, patient's family, and facility notified of DC. Discharge Summary and FL2 sent to facility. DC packet on chart. Ambulance transport requested for patient.   CSW will sign off for now as social work intervention is no longer needed. Please consult [place] again if new needs arise.      Final next level of care: Skilled Nursing Facility Barriers to Discharge: Barriers Resolved   Patient Goals and CMS Choice Patient states their goals for this hospitalization and ongoing recovery are:: Rehab CMS Medicare.gov Compare Post Acute Care list provided to:: Patient Represenative (must comment) Choice offered to / list presented to : Patient,Adult Children  Discharge Placement   Existing PASRR number confirmed : 06/25/20          Patient [facility] bed at: [facility] Patient to be transferred to facility by: [facility] Name of family member notified: Full, daughter Patient and family notified of of transfer: 06/25/20  Discharge Plan and Services In-house Referral: Clinical Social Work Discharge Planning Services: CM Consult Post Acute Care Choice: Skilled Nursing Facility                               Social Determinants of Health (SDOH) Interventions     Readmission Risk Interventions No flowsheet data found.

## 2020-06-26 DIAGNOSIS — N179 Acute kidney failure, unspecified: Secondary | ICD-10-CM | POA: Diagnosis not present

## 2020-06-26 DIAGNOSIS — G9341 Metabolic encephalopathy: Secondary | ICD-10-CM | POA: Diagnosis not present

## 2020-06-26 DIAGNOSIS — U071 COVID-19: Secondary | ICD-10-CM | POA: Diagnosis not present

## 2020-06-26 DIAGNOSIS — J1282 Pneumonia due to coronavirus disease 2019: Secondary | ICD-10-CM | POA: Diagnosis not present

## 2020-06-26 DIAGNOSIS — R1310 Dysphagia, unspecified: Secondary | ICD-10-CM | POA: Diagnosis not present

## 2020-06-26 DIAGNOSIS — I480 Paroxysmal atrial fibrillation: Secondary | ICD-10-CM | POA: Diagnosis not present

## 2020-06-26 DIAGNOSIS — I2699 Other pulmonary embolism without acute cor pulmonale: Secondary | ICD-10-CM | POA: Diagnosis not present

## 2020-06-26 DIAGNOSIS — A0472 Enterocolitis due to Clostridium difficile, not specified as recurrent: Secondary | ICD-10-CM | POA: Diagnosis not present

## 2020-07-07 DIAGNOSIS — I2699 Other pulmonary embolism without acute cor pulmonale: Secondary | ICD-10-CM | POA: Diagnosis not present

## 2020-07-07 DIAGNOSIS — U071 COVID-19: Secondary | ICD-10-CM | POA: Diagnosis not present

## 2020-07-07 DIAGNOSIS — N179 Acute kidney failure, unspecified: Secondary | ICD-10-CM | POA: Diagnosis not present

## 2020-07-07 DIAGNOSIS — A0472 Enterocolitis due to Clostridium difficile, not specified as recurrent: Secondary | ICD-10-CM | POA: Diagnosis not present

## 2020-07-07 DIAGNOSIS — R1310 Dysphagia, unspecified: Secondary | ICD-10-CM | POA: Diagnosis not present

## 2020-07-07 DIAGNOSIS — G9341 Metabolic encephalopathy: Secondary | ICD-10-CM | POA: Diagnosis not present

## 2020-07-07 DIAGNOSIS — I480 Paroxysmal atrial fibrillation: Secondary | ICD-10-CM | POA: Diagnosis not present

## 2020-07-07 DIAGNOSIS — R531 Weakness: Secondary | ICD-10-CM | POA: Diagnosis not present

## 2020-07-10 DIAGNOSIS — D649 Anemia, unspecified: Secondary | ICD-10-CM | POA: Diagnosis not present

## 2020-07-13 DIAGNOSIS — J189 Pneumonia, unspecified organism: Secondary | ICD-10-CM | POA: Diagnosis not present

## 2020-07-13 DIAGNOSIS — R918 Other nonspecific abnormal finding of lung field: Secondary | ICD-10-CM | POA: Diagnosis not present

## 2020-07-17 DIAGNOSIS — U071 COVID-19: Secondary | ICD-10-CM | POA: Diagnosis not present

## 2020-07-17 DIAGNOSIS — R918 Other nonspecific abnormal finding of lung field: Secondary | ICD-10-CM | POA: Diagnosis not present

## 2020-07-17 DIAGNOSIS — J189 Pneumonia, unspecified organism: Secondary | ICD-10-CM | POA: Diagnosis not present

## 2020-07-20 ENCOUNTER — Other Ambulatory Visit: Payer: Self-pay

## 2020-07-20 NOTE — Patient Outreach (Signed)
[facility]) Care Management  07/20/2020  Dwayne Barnes 11-23-1950 [id number]   Referral Date: 07/20/20 Referral Source: [employer] Report Date of Discharge: 07/17/20 Facility:  [name] Insurance: [employer]   Referral received.  No outreach warranted at this time.  Transition of Care calls being completed via EMMI. RN CM will outreach patient for any red flags received.    Plan: RN CM will close case.    Bary Leriche, RN, MSN [facility] Care Management Care Management Coordinator Direct Line [phone] Toll Free: [phone]  Fax: [phone]

## 2020-08-28 DIAGNOSIS — Z8616 Personal history of COVID-19: Secondary | ICD-10-CM | POA: Diagnosis not present

## 2020-08-28 DIAGNOSIS — R413 Other amnesia: Secondary | ICD-10-CM | POA: Diagnosis not present

## 2020-08-28 DIAGNOSIS — Z8619 Personal history of other infectious and parasitic diseases: Secondary | ICD-10-CM | POA: Diagnosis not present

## 2020-08-28 DIAGNOSIS — N390 Urinary tract infection, site not specified: Secondary | ICD-10-CM | POA: Diagnosis not present

## 2020-08-28 DIAGNOSIS — Z1211 Encounter for screening for malignant neoplasm of colon: Secondary | ICD-10-CM | POA: Diagnosis not present

## 2020-08-28 DIAGNOSIS — I2699 Other pulmonary embolism without acute cor pulmonale: Secondary | ICD-10-CM | POA: Diagnosis not present

## 2020-08-28 DIAGNOSIS — D649 Anemia, unspecified: Secondary | ICD-10-CM | POA: Diagnosis not present

## 2020-08-28 DIAGNOSIS — Z0000 Encounter for general adult medical examination without abnormal findings: Secondary | ICD-10-CM | POA: Diagnosis not present

## 2020-08-28 DIAGNOSIS — Z13220 Encounter for screening for lipoid disorders: Secondary | ICD-10-CM | POA: Diagnosis not present

## 2020-08-28 DIAGNOSIS — R351 Nocturia: Secondary | ICD-10-CM | POA: Diagnosis not present

## 2020-08-28 DIAGNOSIS — E871 Hypo-osmolality and hyponatremia: Secondary | ICD-10-CM | POA: Diagnosis not present

## 2020-08-28 DIAGNOSIS — Z125 Encounter for screening for malignant neoplasm of prostate: Secondary | ICD-10-CM | POA: Diagnosis not present

## 2020-09-16 DIAGNOSIS — I2782 Chronic pulmonary embolism: Secondary | ICD-10-CM | POA: Diagnosis not present

## 2020-09-16 DIAGNOSIS — Z1211 Encounter for screening for malignant neoplasm of colon: Secondary | ICD-10-CM | POA: Diagnosis not present

## 2020-09-16 DIAGNOSIS — N390 Urinary tract infection, site not specified: Secondary | ICD-10-CM | POA: Diagnosis not present

## 2020-09-16 DIAGNOSIS — U071 COVID-19: Secondary | ICD-10-CM | POA: Diagnosis not present

## 2020-09-16 DIAGNOSIS — I824Z9 Acute embolism and thrombosis of unspecified deep veins of unspecified distal lower extremity: Secondary | ICD-10-CM | POA: Diagnosis not present

## 2020-09-16 DIAGNOSIS — Z09 Encounter for follow-up examination after completed treatment for conditions other than malignant neoplasm: Secondary | ICD-10-CM | POA: Diagnosis not present

## 2020-09-16 DIAGNOSIS — Z86711 Personal history of pulmonary embolism: Secondary | ICD-10-CM | POA: Diagnosis not present

## 2020-09-16 DIAGNOSIS — I469 Cardiac arrest, cause unspecified: Secondary | ICD-10-CM | POA: Diagnosis not present

## 2020-09-16 DIAGNOSIS — I2609 Other pulmonary embolism with acute cor pulmonale: Secondary | ICD-10-CM | POA: Diagnosis not present

## 2020-09-16 DIAGNOSIS — Z86718 Personal history of other venous thrombosis and embolism: Secondary | ICD-10-CM | POA: Diagnosis not present

## 2020-09-16 DIAGNOSIS — Z79899 Other long term (current) drug therapy: Secondary | ICD-10-CM | POA: Diagnosis not present

## 2020-09-16 DIAGNOSIS — Z95828 Presence of other vascular implants and grafts: Secondary | ICD-10-CM | POA: Diagnosis not present

## 2020-09-16 DIAGNOSIS — K640 First degree hemorrhoids: Secondary | ICD-10-CM | POA: Diagnosis not present

## 2020-09-16 DIAGNOSIS — Z87891 Personal history of nicotine dependence: Secondary | ICD-10-CM | POA: Diagnosis not present

## 2020-09-16 DIAGNOSIS — Z7902 Long term (current) use of antithrombotics/antiplatelets: Secondary | ICD-10-CM | POA: Diagnosis not present

## 2020-09-16 DIAGNOSIS — K625 Hemorrhage of anus and rectum: Secondary | ICD-10-CM | POA: Diagnosis not present

## 2020-09-16 DIAGNOSIS — Z87440 Personal history of urinary (tract) infections: Secondary | ICD-10-CM | POA: Diagnosis not present

## 2020-09-16 DIAGNOSIS — I82512 Chronic embolism and thrombosis of left femoral vein: Secondary | ICD-10-CM | POA: Diagnosis not present

## 2020-09-18 ENCOUNTER — Other Ambulatory Visit: Payer: Self-pay | Admitting: Diagnostic Radiology

## 2020-09-18 DIAGNOSIS — Z95828 Presence of other vascular implants and grafts: Secondary | ICD-10-CM

## 2020-09-18 DIAGNOSIS — Z86718 Personal history of other venous thrombosis and embolism: Secondary | ICD-10-CM

## 2020-10-15 DIAGNOSIS — Z95828 Presence of other vascular implants and grafts: Secondary | ICD-10-CM | POA: Diagnosis not present

## 2020-10-15 DIAGNOSIS — I2699 Other pulmonary embolism without acute cor pulmonale: Secondary | ICD-10-CM | POA: Diagnosis not present

## 2020-11-16 DIAGNOSIS — I8290 Acute embolism and thrombosis of unspecified vein: Secondary | ICD-10-CM | POA: Diagnosis not present

## 2020-11-16 DIAGNOSIS — Z87891 Personal history of nicotine dependence: Secondary | ICD-10-CM | POA: Diagnosis not present

## 2020-11-16 DIAGNOSIS — Z4589 Encounter for adjustment and management of other implanted devices: Secondary | ICD-10-CM | POA: Diagnosis not present

## 2020-11-16 DIAGNOSIS — Z452 Encounter for adjustment and management of vascular access device: Secondary | ICD-10-CM | POA: Diagnosis not present

## 2020-11-16 DIAGNOSIS — Z86718 Personal history of other venous thrombosis and embolism: Secondary | ICD-10-CM | POA: Diagnosis not present

## 2020-11-16 DIAGNOSIS — Z95828 Presence of other vascular implants and grafts: Secondary | ICD-10-CM | POA: Diagnosis not present

## 2020-11-16 DIAGNOSIS — I2699 Other pulmonary embolism without acute cor pulmonale: Secondary | ICD-10-CM | POA: Diagnosis not present

## 2020-11-16 DIAGNOSIS — Z79899 Other long term (current) drug therapy: Secondary | ICD-10-CM | POA: Diagnosis not present

## 2020-11-16 DIAGNOSIS — Z86711 Personal history of pulmonary embolism: Secondary | ICD-10-CM | POA: Diagnosis not present

## 2020-12-31 DIAGNOSIS — N3000 Acute cystitis without hematuria: Secondary | ICD-10-CM | POA: Diagnosis not present

## 2021-01-01 DIAGNOSIS — N3000 Acute cystitis without hematuria: Secondary | ICD-10-CM | POA: Diagnosis not present

## 2021-01-22 DIAGNOSIS — R309 Painful micturition, unspecified: Secondary | ICD-10-CM | POA: Diagnosis not present

## 2021-02-09 DIAGNOSIS — R350 Frequency of micturition: Secondary | ICD-10-CM | POA: Diagnosis not present

## 2021-02-09 DIAGNOSIS — R309 Painful micturition, unspecified: Secondary | ICD-10-CM | POA: Diagnosis not present

## 2021-02-09 DIAGNOSIS — R339 Retention of urine, unspecified: Secondary | ICD-10-CM | POA: Diagnosis not present

## 2021-03-10 DIAGNOSIS — I2782 Chronic pulmonary embolism: Secondary | ICD-10-CM | POA: Diagnosis not present

## 2021-03-10 DIAGNOSIS — I82512 Chronic embolism and thrombosis of left femoral vein: Secondary | ICD-10-CM | POA: Diagnosis not present

## 2021-03-10 DIAGNOSIS — N390 Urinary tract infection, site not specified: Secondary | ICD-10-CM | POA: Diagnosis not present

## 2021-03-10 DIAGNOSIS — I2609 Other pulmonary embolism with acute cor pulmonale: Secondary | ICD-10-CM | POA: Diagnosis not present

## 2021-03-10 DIAGNOSIS — R350 Frequency of micturition: Secondary | ICD-10-CM | POA: Diagnosis not present

## 2021-03-10 DIAGNOSIS — Z95828 Presence of other vascular implants and grafts: Secondary | ICD-10-CM | POA: Diagnosis not present

## 2021-03-10 DIAGNOSIS — Z7901 Long term (current) use of anticoagulants: Secondary | ICD-10-CM | POA: Diagnosis not present

## 2021-03-10 DIAGNOSIS — D649 Anemia, unspecified: Secondary | ICD-10-CM | POA: Diagnosis not present

## 2022-03-28 IMAGING — CT CT HEAD W/O CM
3 series · 16 of 47 positions shown, 19 images · non-contrast
Comparison: Head CT dated 04/18/2010.

CLINICAL DATA: 69-year-old male with altered mental status.

EXAM:
CT HEAD WITHOUT CONTRAST
TECHNIQUE: Contiguous axial images were obtained from the base of the skull
through the vertex without intravenous contrast.

[Series 3: head 5.0 h30s · axial · 0.44mm/px · z∈[-205,-80]mm · 10 of 30 slices shown, 13 images]
[im 3/30  brain]
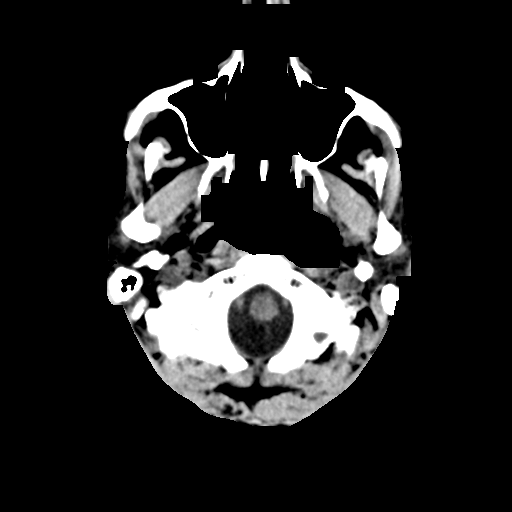
[im 3/30  bone]
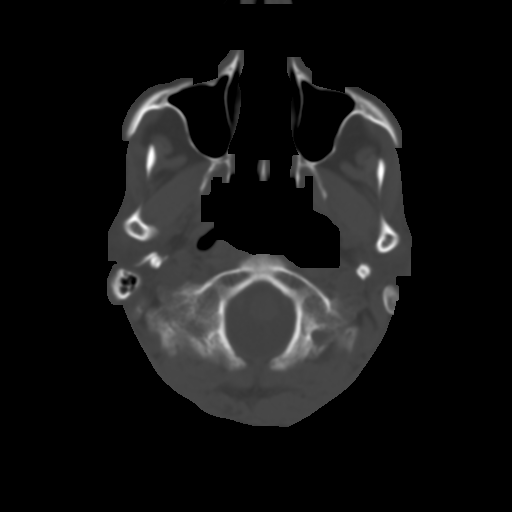
[im 6/30  brain]
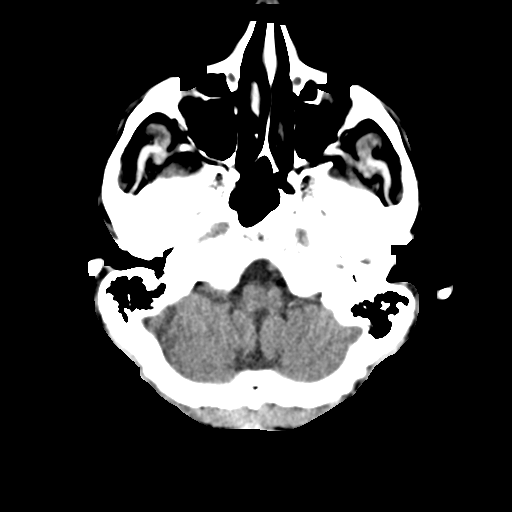
[im 9/30  brain]
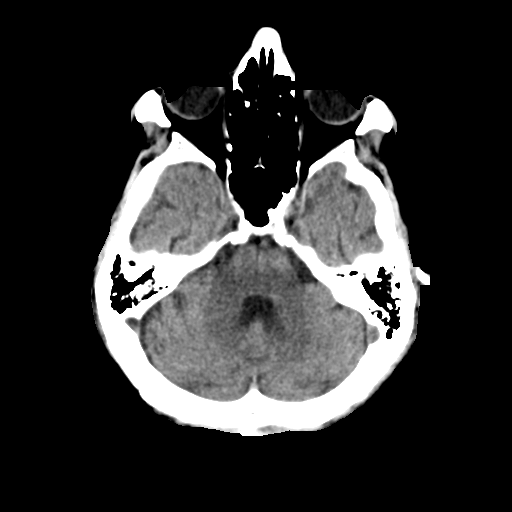
[im 11/30  brain]
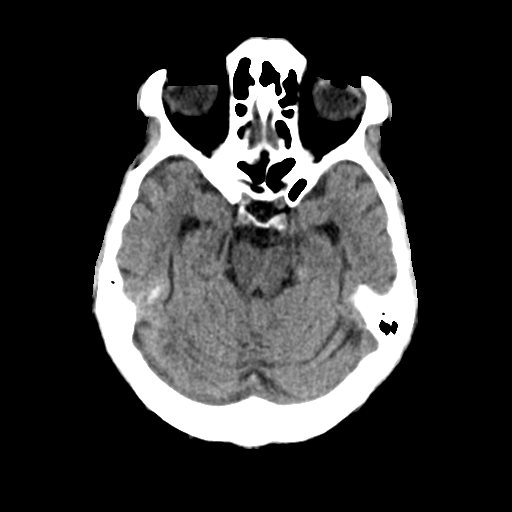
[im 14/30  brain]
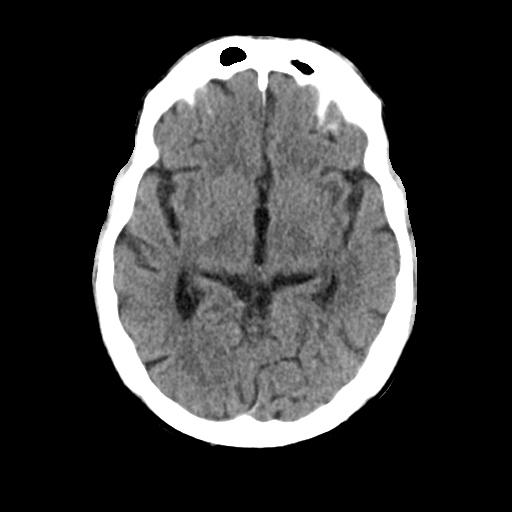
[im 14/30  bone]
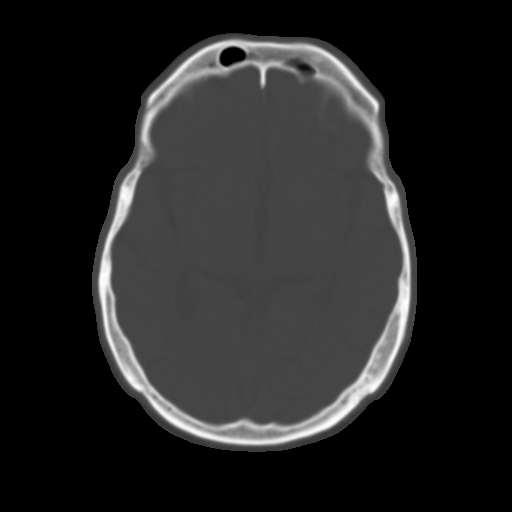
[im 17/30  brain]
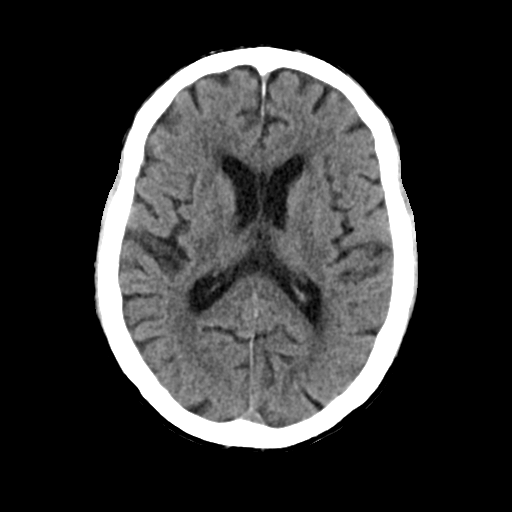
[im 20/30  brain]
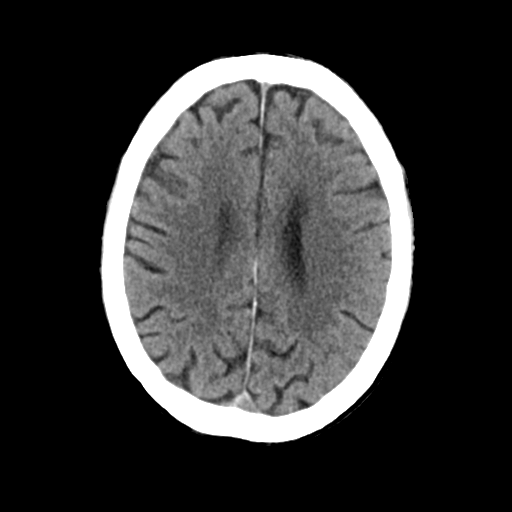
[im 23/30  brain]
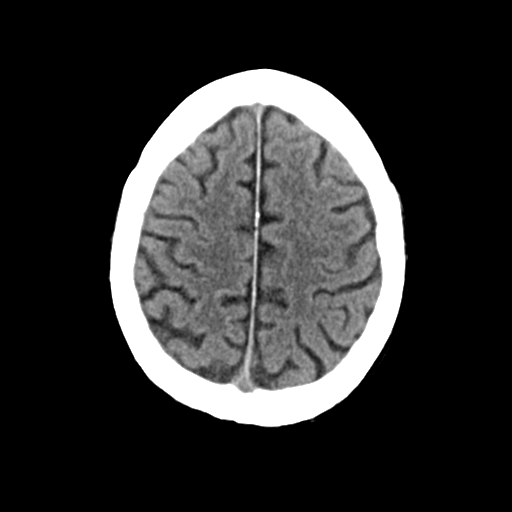
[im 25/30  brain]
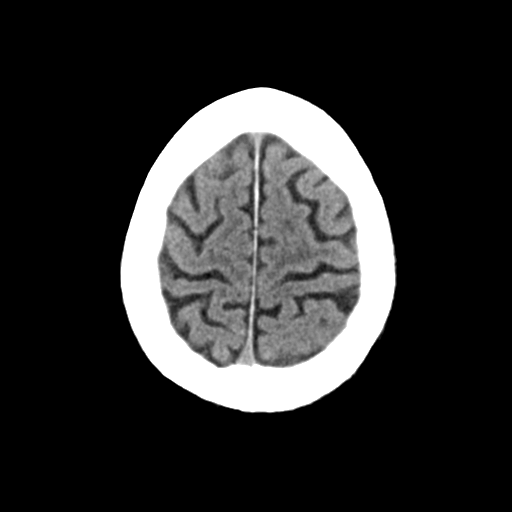
[im 25/30  bone]
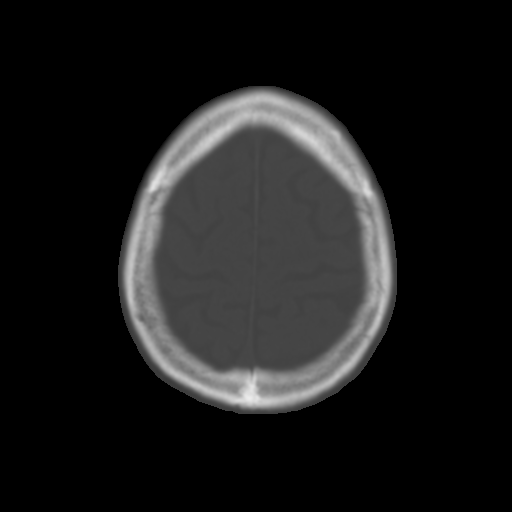
[im 28/30  brain]
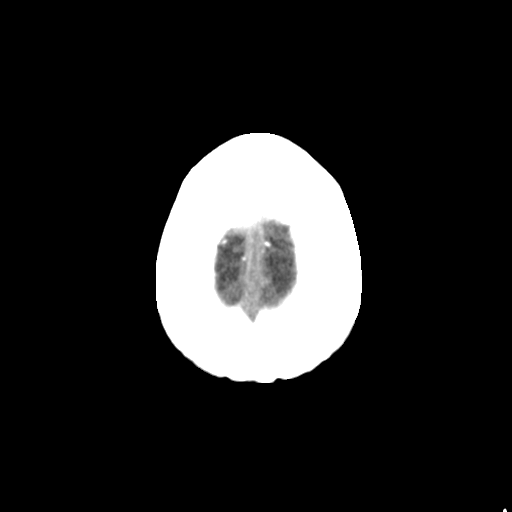

[Series 5: head 3.0 mpr cor · coronal · 0.31mm/px · 3 of 67 slices shown]
[im 23/67  brain]
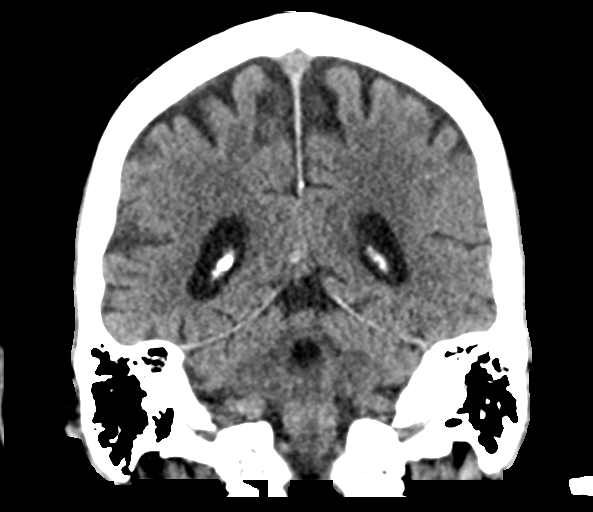
[im 30/67  brain]
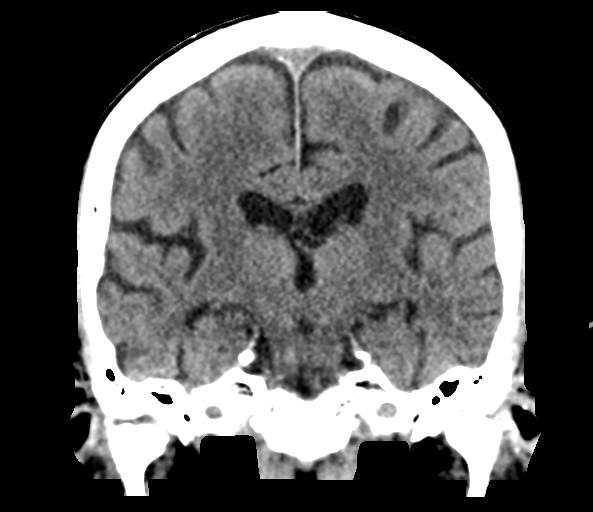
[im 37/67  brain]
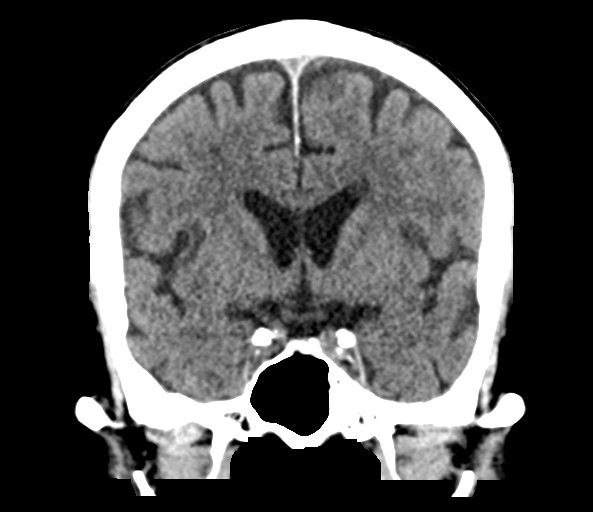

[Series 6: head 3.0 mpr sag · sagittal · 0.33mm/px · 3 of 65 slices shown]
[im 22/65  brain]
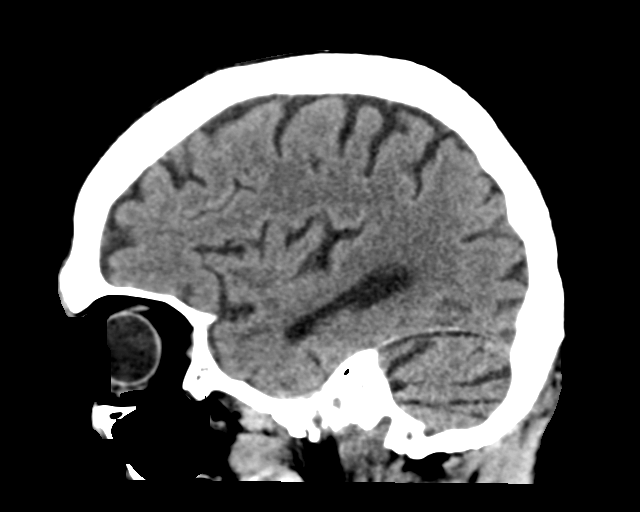
[im 33/65  brain]
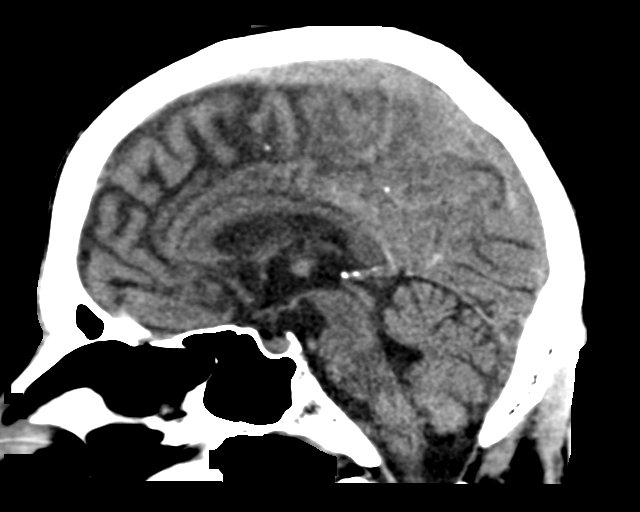
[im 43/65  brain]
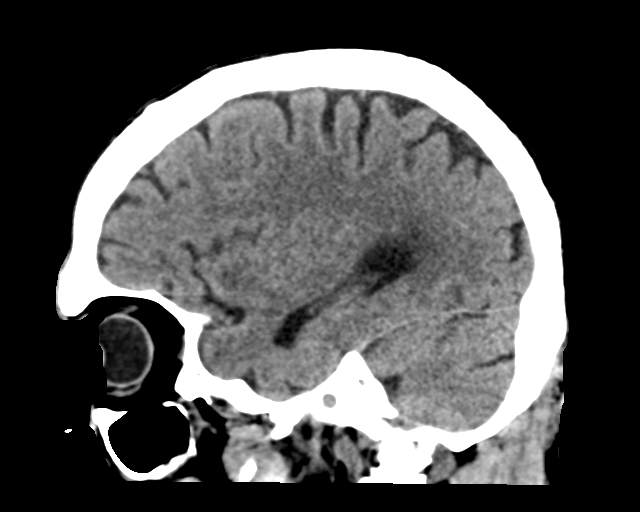

[16 of 47 positions shown; findings below may reference images not displayed]

FINDINGS: Brain: The ventricles and sulci appropriate size for patient's age.
The gray-white matter discrimination is preserved. There is no acute
intracranial hemorrhage. No mass effect midline shift. No
extra-axial fluid collection.

Vascular: No hyperdense vessel or unexpected calcification.

Skull: Normal. Negative for fracture or focal lesion.

Sinuses/Orbits: No acute finding.

Other: None
IMPRESSION: Unremarkable noncontrast CT of the brain.
